# Patient Record
Sex: Female | Born: 1975 | Race: White | Hispanic: No | Smoking: Never smoker
Health system: Southern US, Community
[De-identification: ages and names within clinical notes are randomized; demographics above are authoritative.]

## PROBLEM LIST (undated history)

## (undated) DIAGNOSIS — J45909 Unspecified asthma, uncomplicated: Secondary | ICD-10-CM

## (undated) DIAGNOSIS — N979 Female infertility, unspecified: Secondary | ICD-10-CM

## (undated) DIAGNOSIS — F419 Anxiety disorder, unspecified: Secondary | ICD-10-CM

## (undated) DIAGNOSIS — R112 Nausea with vomiting, unspecified: Secondary | ICD-10-CM

## (undated) DIAGNOSIS — Z8742 Personal history of other diseases of the female genital tract: Secondary | ICD-10-CM

## (undated) DIAGNOSIS — K8020 Calculus of gallbladder without cholecystitis without obstruction: Secondary | ICD-10-CM

## (undated) DIAGNOSIS — Z9889 Other specified postprocedural states: Secondary | ICD-10-CM

## (undated) DIAGNOSIS — M4800 Spinal stenosis, site unspecified: Secondary | ICD-10-CM

## (undated) DIAGNOSIS — R52 Pain, unspecified: Secondary | ICD-10-CM

## (undated) DIAGNOSIS — Z6791 Unspecified blood type, Rh negative: Secondary | ICD-10-CM

## (undated) DIAGNOSIS — K8050 Calculus of bile duct without cholangitis or cholecystitis without obstruction: Secondary | ICD-10-CM

## (undated) DIAGNOSIS — R87619 Unspecified abnormal cytological findings in specimens from cervix uteri: Secondary | ICD-10-CM

## (undated) DIAGNOSIS — IMO0002 Reserved for concepts with insufficient information to code with codable children: Secondary | ICD-10-CM

## (undated) HISTORY — DX: Unspecified asthma, uncomplicated: J45.909

## (undated) HISTORY — DX: Personal history of other diseases of the female genital tract: Z87.42

## (undated) HISTORY — PX: OTHER SURGICAL HISTORY: SHX169

## (undated) HISTORY — DX: Female infertility, unspecified: N97.9

## (undated) HISTORY — PX: WISDOM TOOTH EXTRACTION: SHX21

## (undated) HISTORY — PX: BUNIONECTOMY: SHX129

## (undated) HISTORY — DX: Unspecified blood type, rh negative: Z67.91

## (undated) HISTORY — DX: Unspecified abnormal cytological findings in specimens from cervix uteri: R87.619

## (undated) HISTORY — DX: Spinal stenosis, site unspecified: M48.00

## (undated) HISTORY — DX: Reserved for concepts with insufficient information to code with codable children: IMO0002

---

## 1998-01-29 ENCOUNTER — Emergency Department (HOSPITAL_COMMUNITY): Admission: EM | Admit: 1998-01-29 | Discharge: 1998-01-30 | Payer: Self-pay | Admitting: Emergency Medicine

## 1999-03-22 ENCOUNTER — Encounter (INDEPENDENT_AMBULATORY_CARE_PROVIDER_SITE_OTHER): Payer: Self-pay | Admitting: Specialist

## 1999-03-22 ENCOUNTER — Other Ambulatory Visit: Admission: RE | Admit: 1999-03-22 | Discharge: 1999-03-22 | Payer: Self-pay | Admitting: Obstetrics & Gynecology

## 1999-12-05 ENCOUNTER — Other Ambulatory Visit: Admission: RE | Admit: 1999-12-05 | Discharge: 1999-12-05 | Payer: Self-pay | Admitting: Obstetrics & Gynecology

## 2000-04-02 ENCOUNTER — Other Ambulatory Visit: Admission: RE | Admit: 2000-04-02 | Discharge: 2000-04-02 | Payer: Self-pay | Admitting: Obstetrics & Gynecology

## 2001-01-08 ENCOUNTER — Other Ambulatory Visit: Admission: RE | Admit: 2001-01-08 | Discharge: 2001-01-08 | Payer: Self-pay | Admitting: Obstetrics and Gynecology

## 2002-01-28 ENCOUNTER — Other Ambulatory Visit: Admission: RE | Admit: 2002-01-28 | Discharge: 2002-01-28 | Payer: Self-pay | Admitting: Obstetrics and Gynecology

## 2002-09-11 ENCOUNTER — Emergency Department (HOSPITAL_COMMUNITY): Admission: EM | Admit: 2002-09-11 | Discharge: 2002-09-11 | Payer: Self-pay | Admitting: Emergency Medicine

## 2002-09-12 ENCOUNTER — Encounter: Payer: Self-pay | Admitting: Emergency Medicine

## 2003-03-19 ENCOUNTER — Other Ambulatory Visit: Admission: RE | Admit: 2003-03-19 | Discharge: 2003-03-19 | Payer: Self-pay | Admitting: Obstetrics and Gynecology

## 2003-09-07 ENCOUNTER — Emergency Department (HOSPITAL_COMMUNITY): Admission: EM | Admit: 2003-09-07 | Discharge: 2003-09-07 | Payer: Self-pay | Admitting: Emergency Medicine
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
## 2003-12-31 ENCOUNTER — Ambulatory Visit (HOSPITAL_COMMUNITY): Admission: RE | Admit: 2003-12-31 | Discharge: 2003-12-31 | Payer: Self-pay | Admitting: Obstetrics and Gynecology

## 2003-12-31 IMAGING — RF DG HYSTEROGRAM
5 series · 5 of 5 positions shown · IV contrast (omnipaque)
Comparison: none

CLINICAL DATA: Primary infertility.  Assess tubal patency.
 HYSTEROGRAM:
 Following sterile cleansing of the external cervical os with Betadine, hysterosalpingography was performed via placement of a 5 French hysterosalpingography catheter into the endocervical canal where it was stabilized with an end catheter balloon.  Approximately 10 cc of Omnipaque 300 was injected into the endometrial canal without difficulty.
 A normal endometrial morphology is seen.  Both fallopian tubes fill and demonstrate a normal morphology.  Bilateral free intraperitoneal spill was documented.  The post-drainage film demonstrates no evidence for loculation of the contrast within the pelvis to suggest the presence of peritubal or periovarian adhesions.  
 IMPRESSION
 Normal HSG.

[Series 1: run · 1 of 1 slices shown (1 of 5)]
[im 1/1]
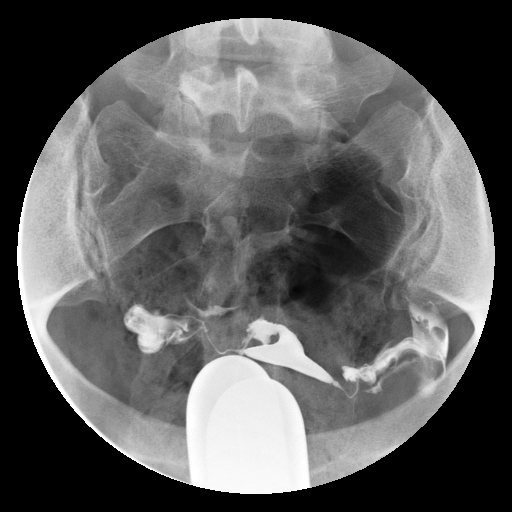

[Series 2: run · 1 of 1 slices shown (2 of 5)]
[im 1/1]
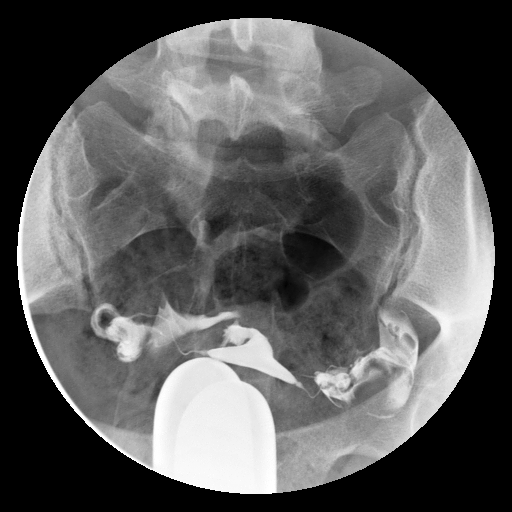

[Series 3: run · 1 of 1 slices shown (3 of 5)]
[im 1/1]
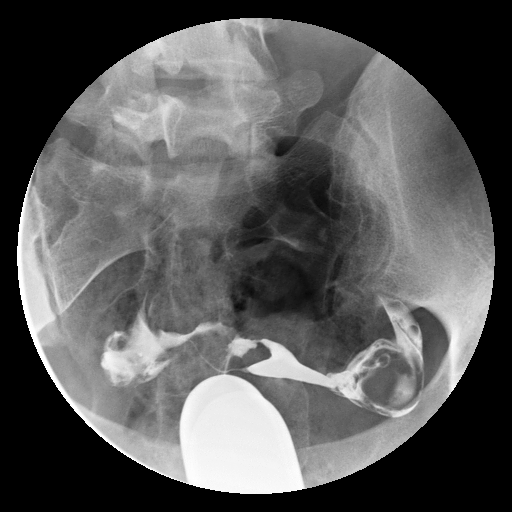

[Series 4: run · 1 of 1 slices shown (4 of 5)]
[im 1/1]
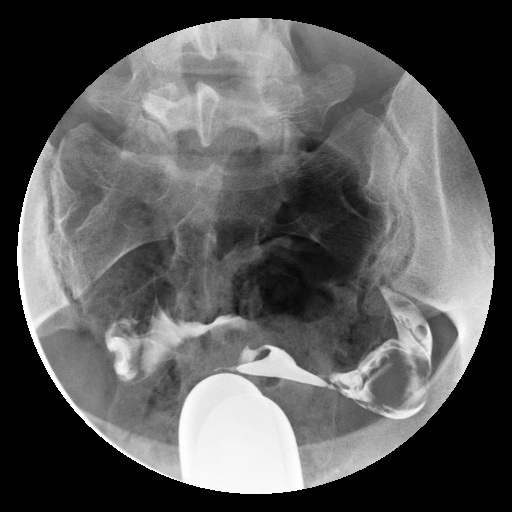

[Series 5: run · 1 of 1 slices shown (5 of 5)]
[im 1/1]
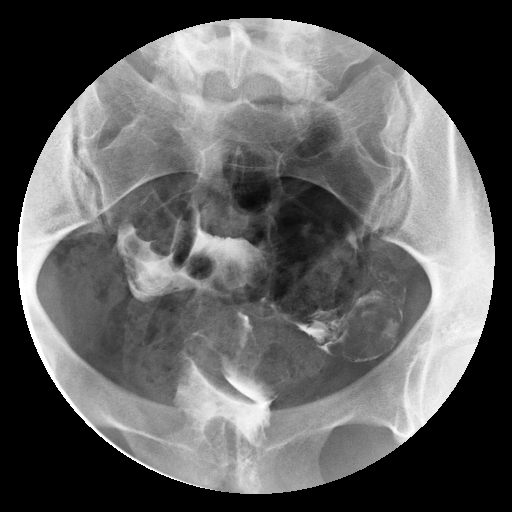

[5 of 5 positions shown; findings below may reference images not displayed]

## 2004-04-01 ENCOUNTER — Ambulatory Visit (HOSPITAL_COMMUNITY): Admission: RE | Admit: 2004-04-01 | Discharge: 2004-04-01 | Payer: Self-pay | Admitting: Obstetrics and Gynecology

## 2004-04-01 IMAGING — US US OB TRANSVAGINAL MODIFY
1 series · 14 of 28 positions shown · non-contrast
Comparison: none

CLINICAL DATA: 7 week, 0 day gestational age by LMP.  Right-sided pelvic pain.  Confirm IUP and evaluate for ectopic pregnancy. 
OBSTETRICAL ULTRASOUND WITH TRANSVAGINAL 
A single living intrauterine gestation is seen with measured heart rate of 122. Embryonic crown-rump length measures 1.1 cm, corresponding to a gestational age of 7 weeks, 1 day.  Normal appearing yolk sac is seen.  A small subchorionic hemorrhage or implantation bleed is seen in the right superior aspect of the gestational sac which measures approximately 1 x 2 cm.  No other maternal uterine abnormality is seen. 
No adnexal masses or free fluid are identified with either transabdominal or transvaginal sonography.  The ovaries are both visualized on transvaginal sonography.  The right ovary contains a thick walled corpus luteum cyst measuring approximately 2 cm.  The left ovary is normal in appearance. 
IMPRESSION
1.  Single living intrauterine gestation with an estimated gestational age of 7 weeks, 1 day and sonographic EDC of [DATE].  This correlates well with LMP.
2.  2 cm right ovarian corpus luteum noted.  Small subchorionic hemorrhage or implantation bleed also noted.

[Series 1: us ob transvaginal modify · 0.15mm/px · 14 of 41 slices shown]
[im 2/41]
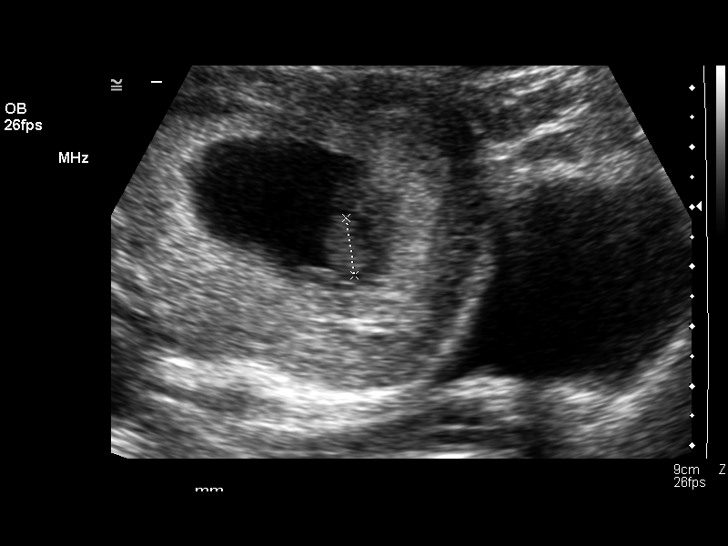
[im 5/41]
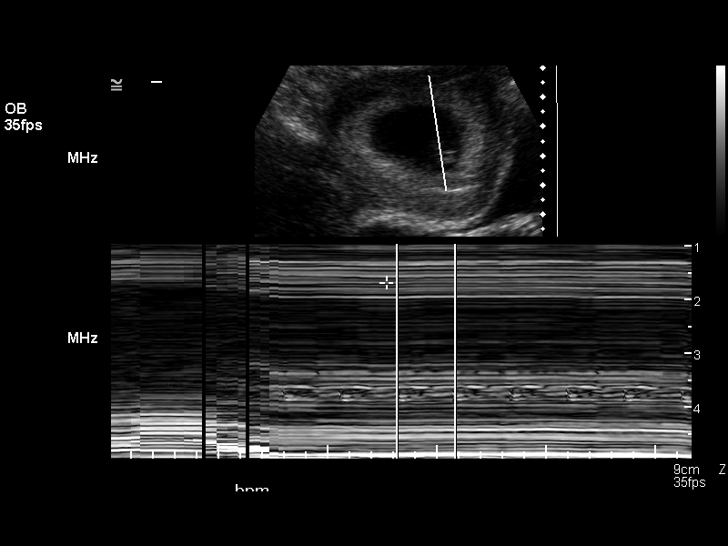
[im 8/41]
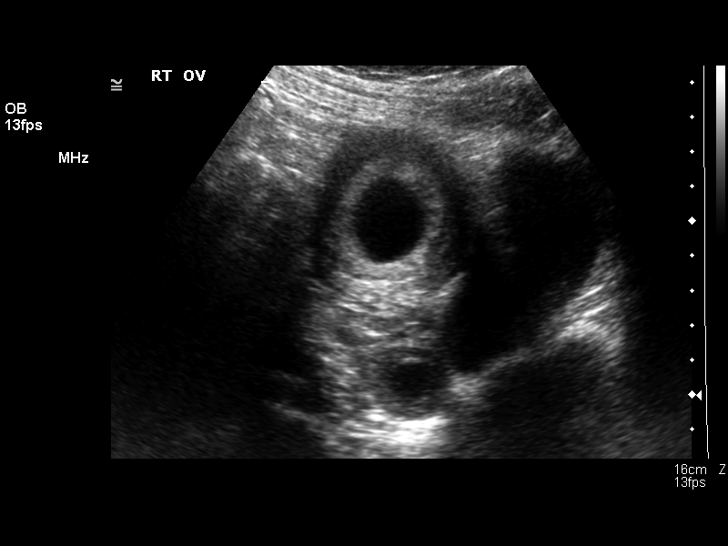
[im 11/41]
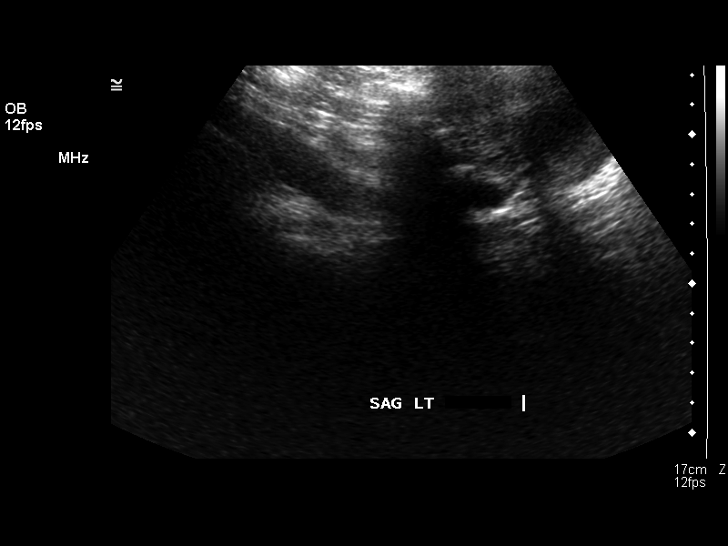
[im 14/41]
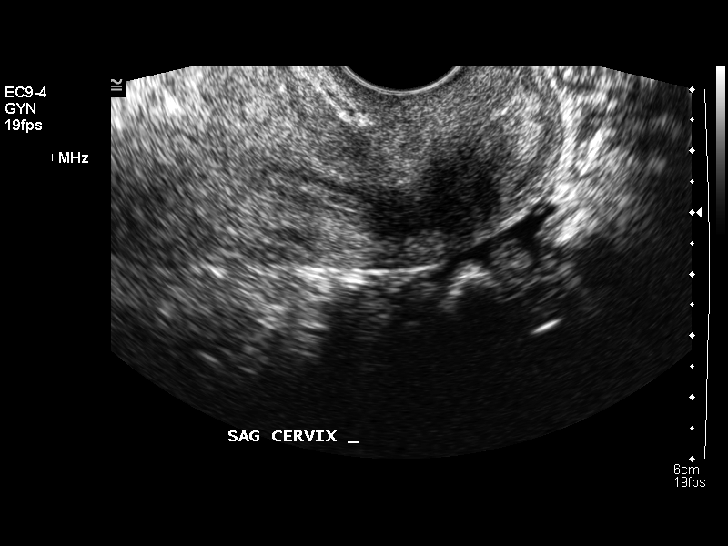
[im 17/41]
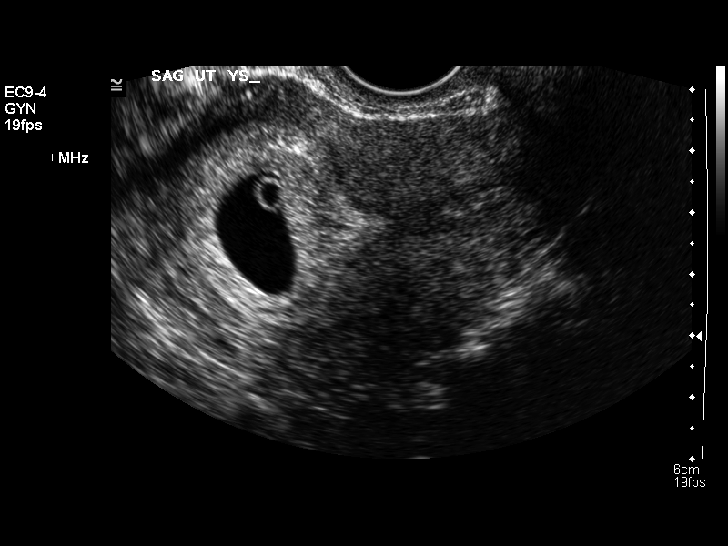
[im 20/41]
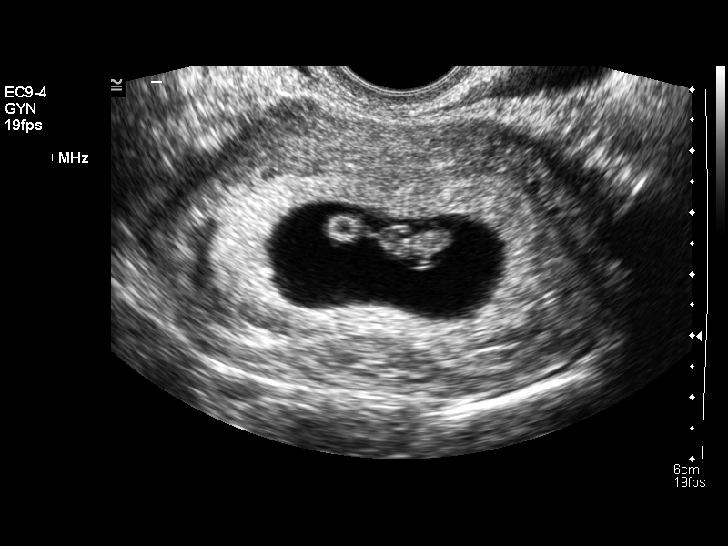
[im 23/41]
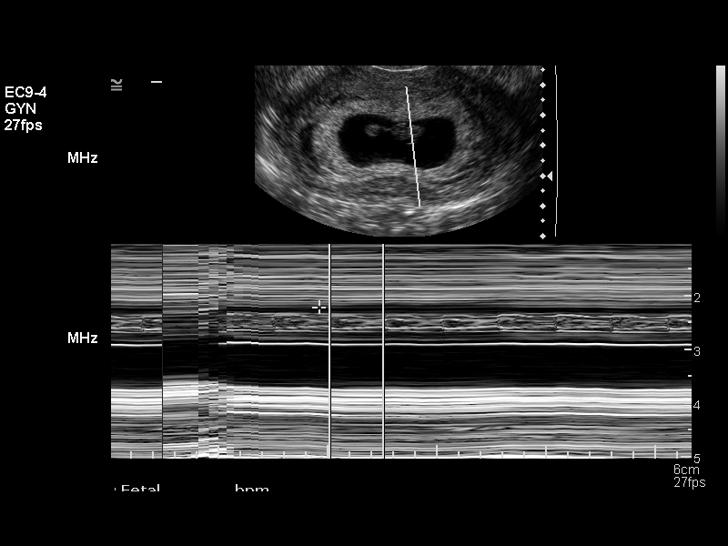
[im 26/41]
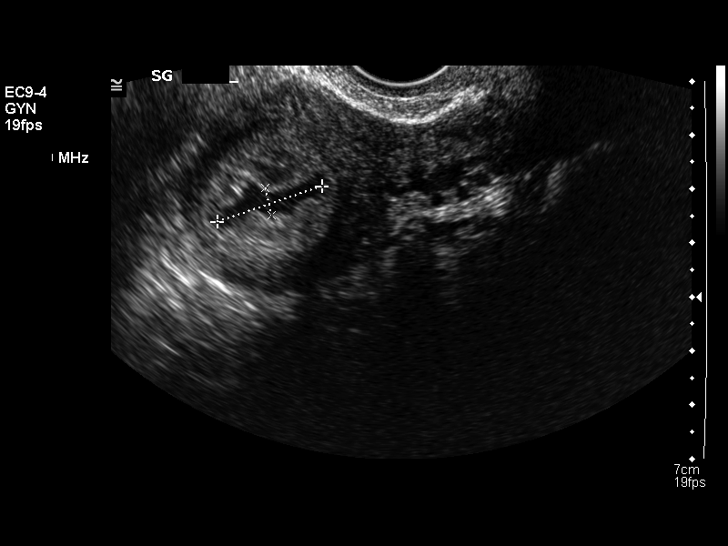
[im 29/41]
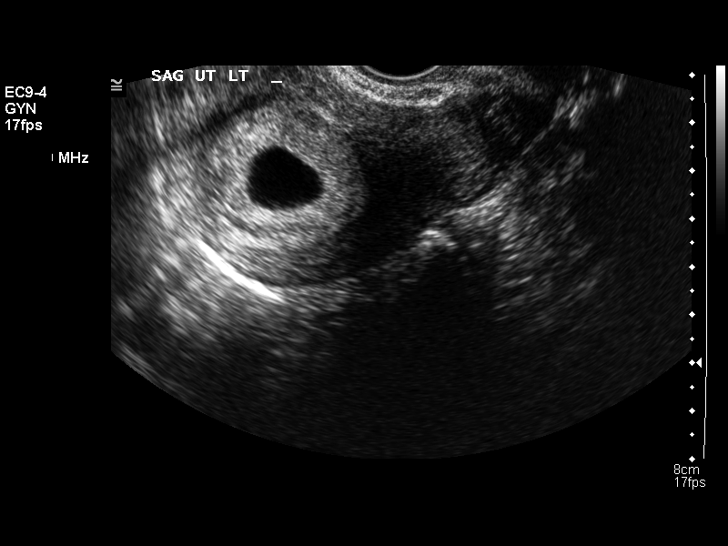
[im 32/41]
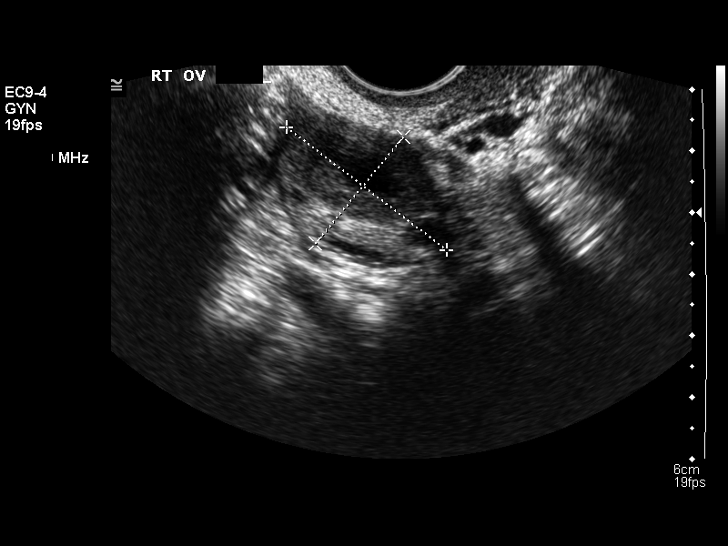
[im 35/41]
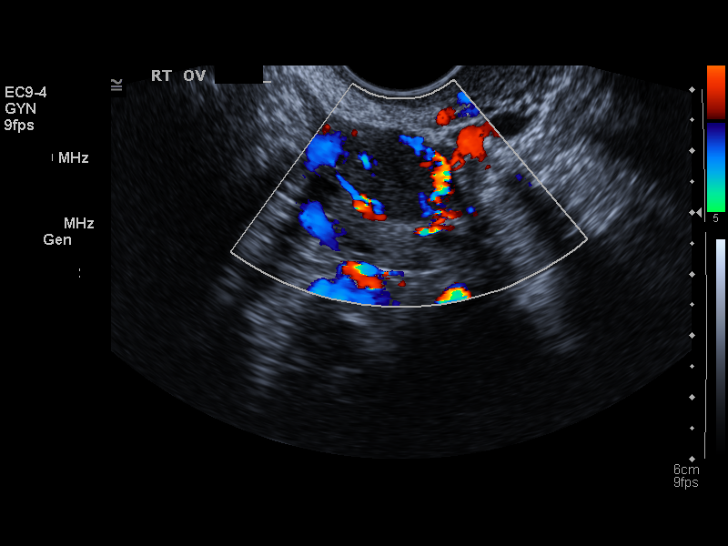
[im 38/41]
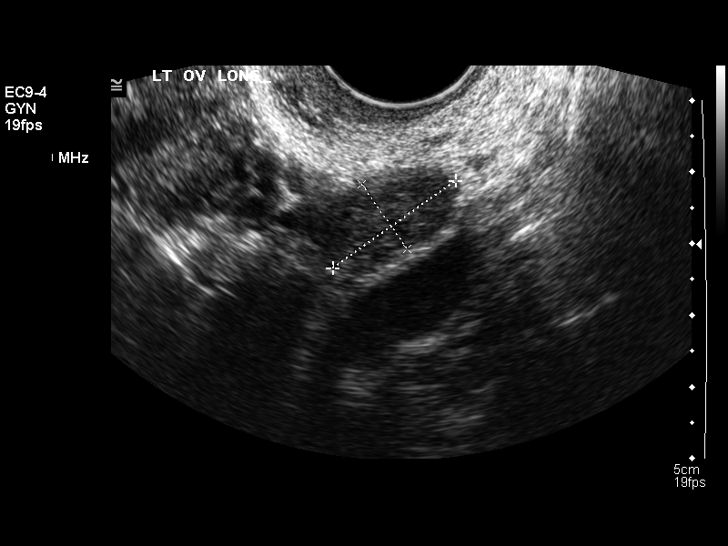
[im 41/41]
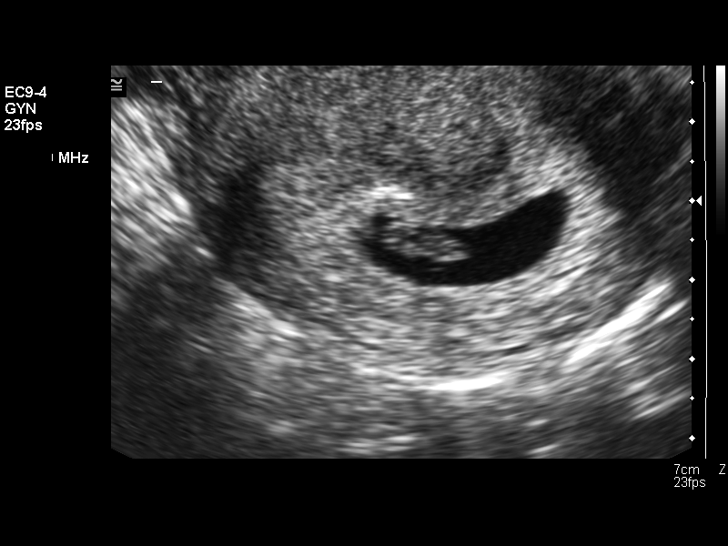

[14 of 28 positions shown; findings below may reference images not displayed]

## 2004-04-12 ENCOUNTER — Inpatient Hospital Stay (HOSPITAL_COMMUNITY): Admission: AD | Admit: 2004-04-12 | Discharge: 2004-04-12 | Payer: Self-pay | Admitting: Obstetrics and Gynecology

## 2004-04-20 ENCOUNTER — Other Ambulatory Visit: Admission: RE | Admit: 2004-04-20 | Discharge: 2004-04-20 | Payer: Self-pay | Admitting: Obstetrics and Gynecology

## 2004-08-29 ENCOUNTER — Inpatient Hospital Stay (HOSPITAL_COMMUNITY): Admission: AD | Admit: 2004-08-29 | Discharge: 2004-08-29 | Payer: Self-pay | Admitting: Obstetrics and Gynecology

## 2004-09-23 ENCOUNTER — Observation Stay (HOSPITAL_COMMUNITY): Admission: AD | Admit: 2004-09-23 | Discharge: 2004-09-24 | Payer: Self-pay | Admitting: Obstetrics and Gynecology

## 2004-09-23 IMAGING — US US FETAL BPP W/O NONSTRESS
1 series · 14 of 15 positions shown · non-contrast
Comparison: none

CLINICAL DATA: 32 weeks pregnant with decreased fetal movement. 
 ULTRASOUND OF FETAL BPP WITHOUT NONSTRESS:
 A single fetus is present in cephalic position.  Fetal heart rate is measured at 131 beats per minute. Fetal movement and breathing movements are detected.  The placenta is anteriorly located grade I.  No previa is seen. The quantity of amniotic fluid is subjectively normal. AFI is 14.8 cm. 8.6 cm is within the 5th percentile for a 32-week gestation with 24.2 cm in the 95th percentile. 
 Evaluation of fetal biometry was not requested. 
 Evaluation of fetal anatomy shows stomach on left, 3 vessel cord, both kidneys, and bladder to appear normal.   Evaluation of the anatomy is limited by gestational age and request for only BPP. 
 The maternal cervix measures 3.1 cm transabdominally. 
 Biophysical profile scores [DATE] over 15 minutes.

[Series 1: us fetal bpp w/o nonstress · 0.45mm/px · 14 of 15 slices shown]
[im 1/15]
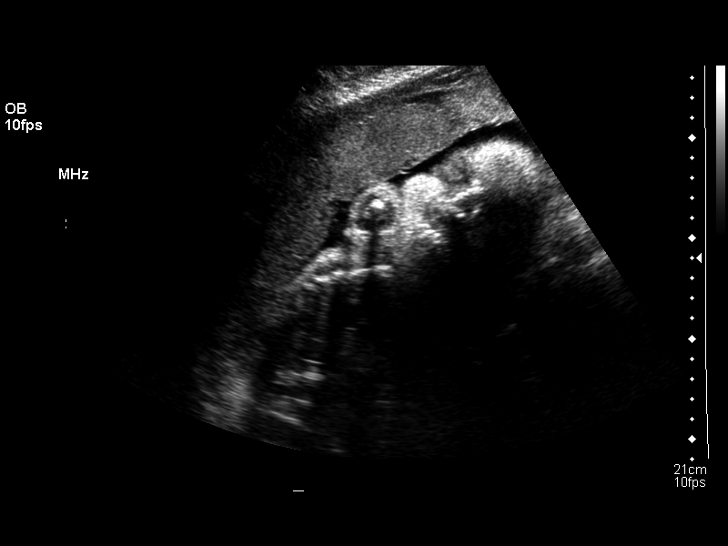
[im 2/15]
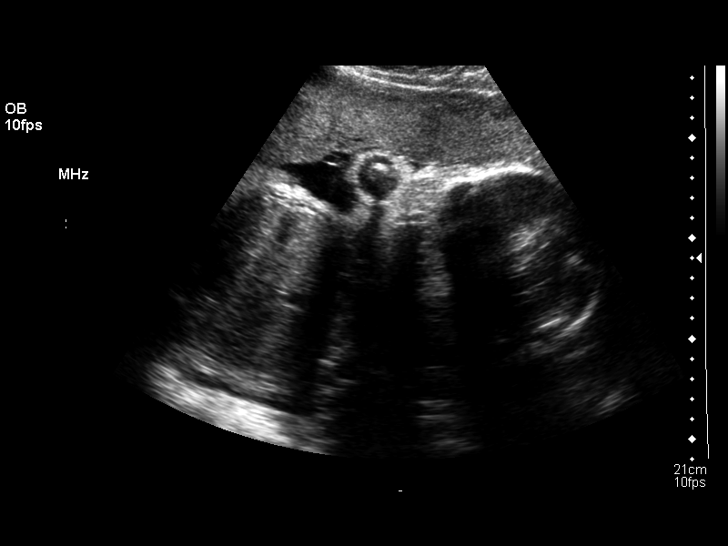
[im 3/15]
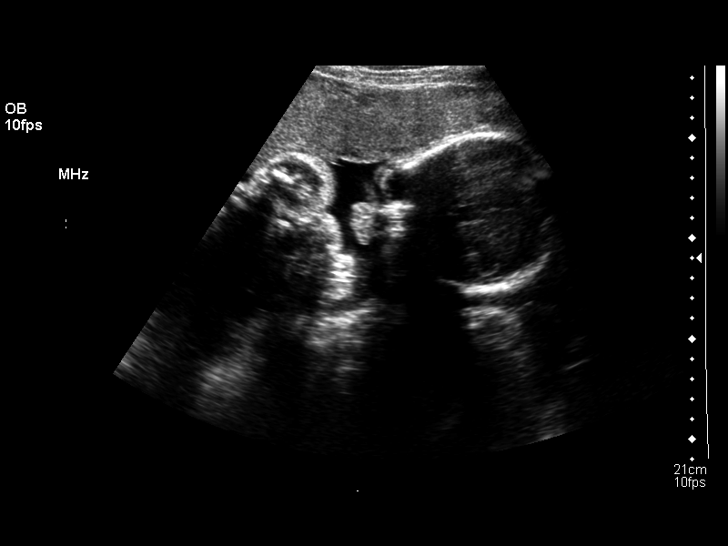
[im 4/15]
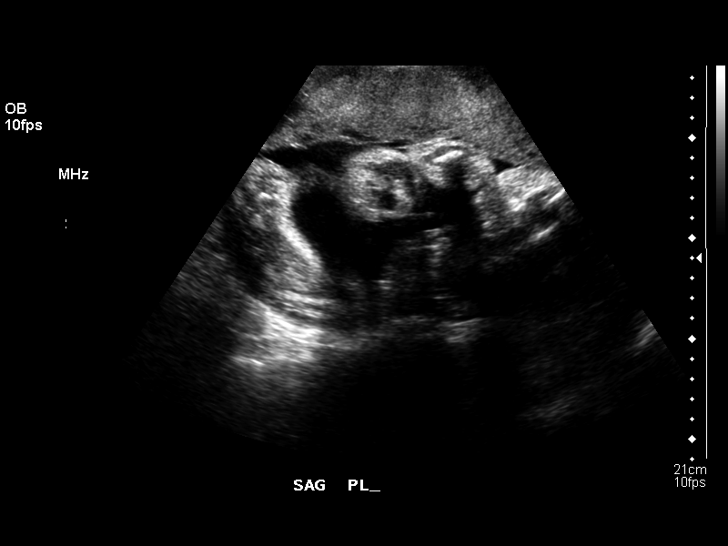
[im 5/15]
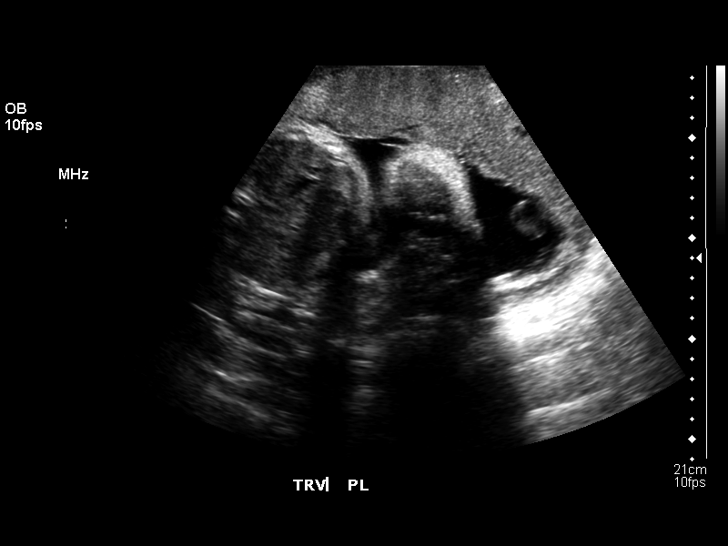
[im 6/15]
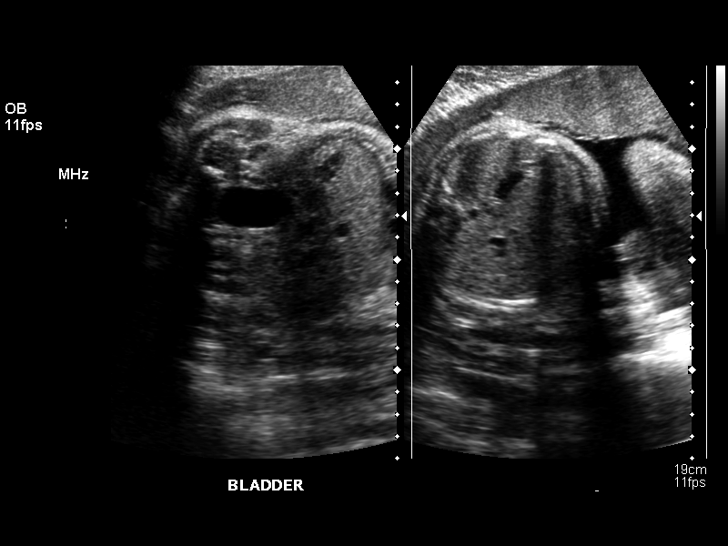
[im 7/15]
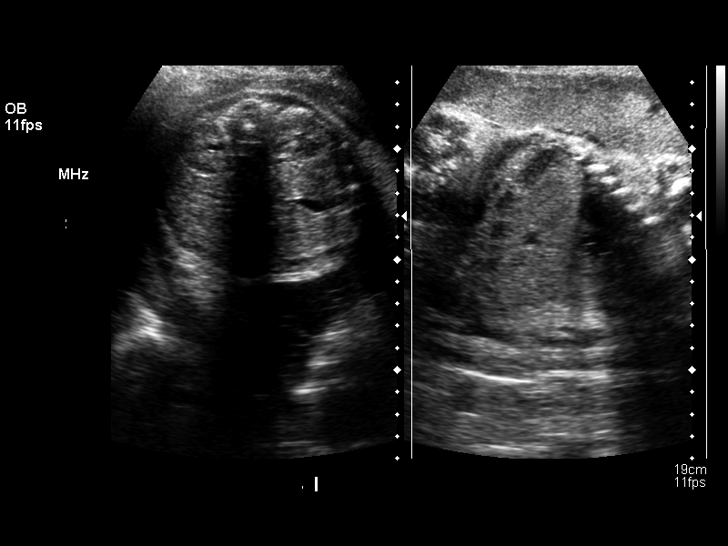
[im 9/15]
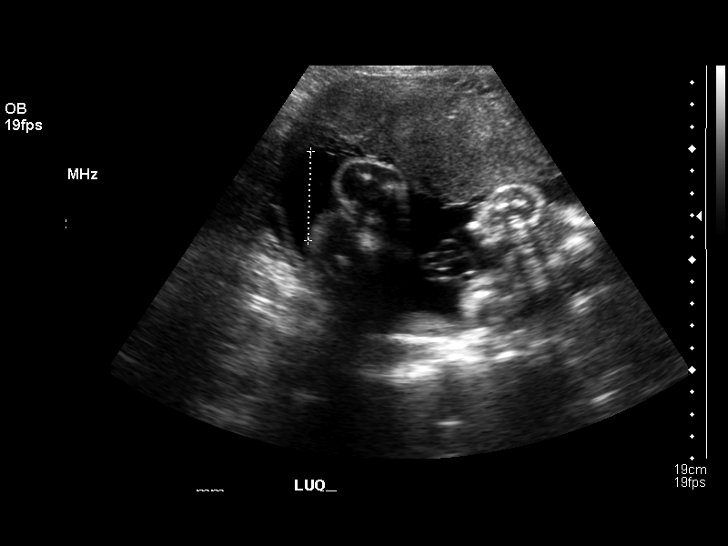
[im 10/15]
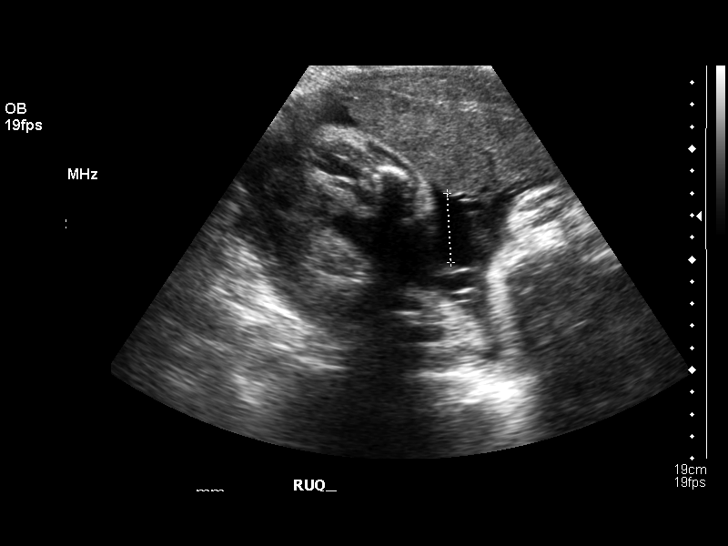
[im 11/15]
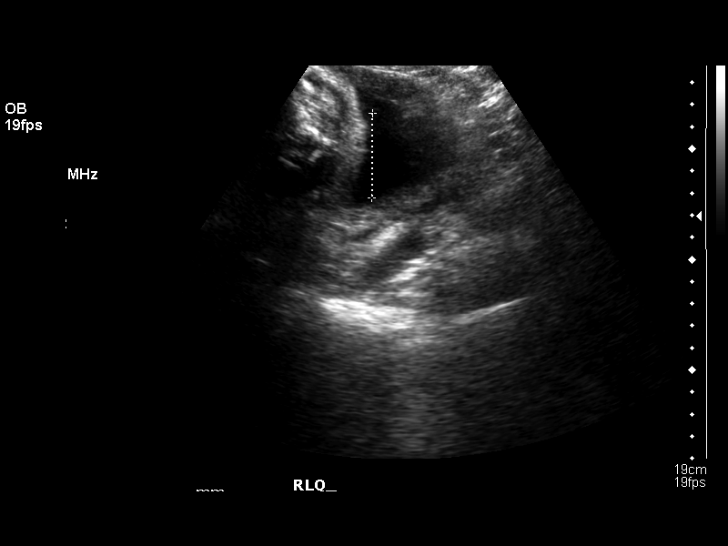
[im 12/15]
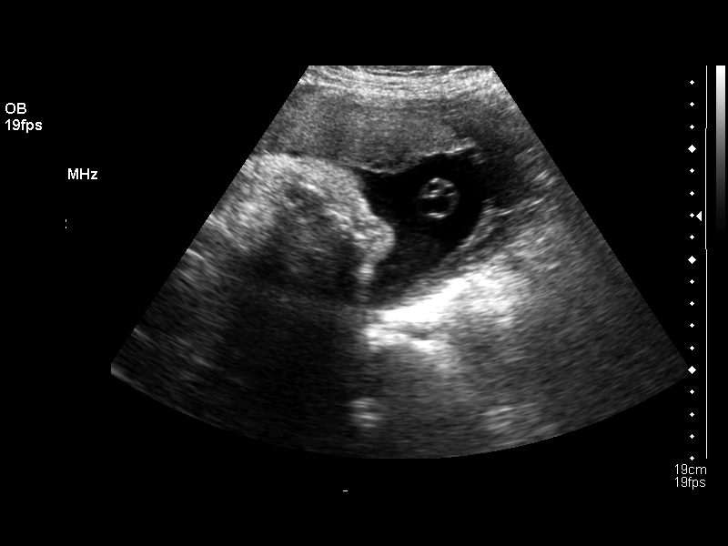
[im 13/15]
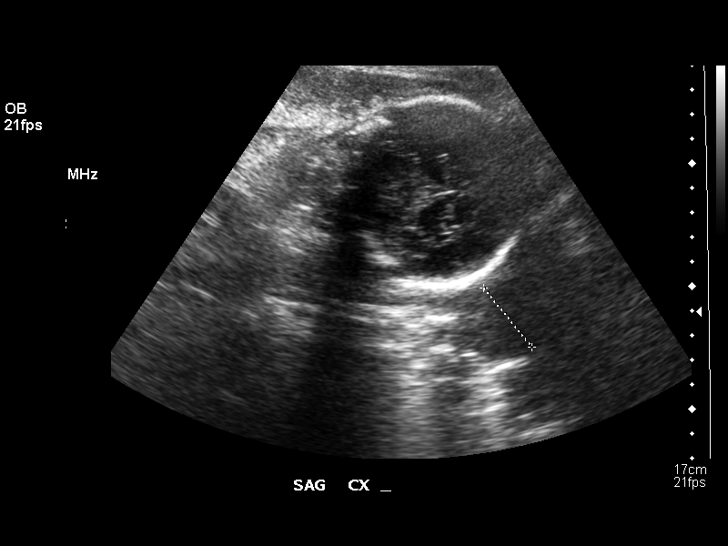
[im 14/15]
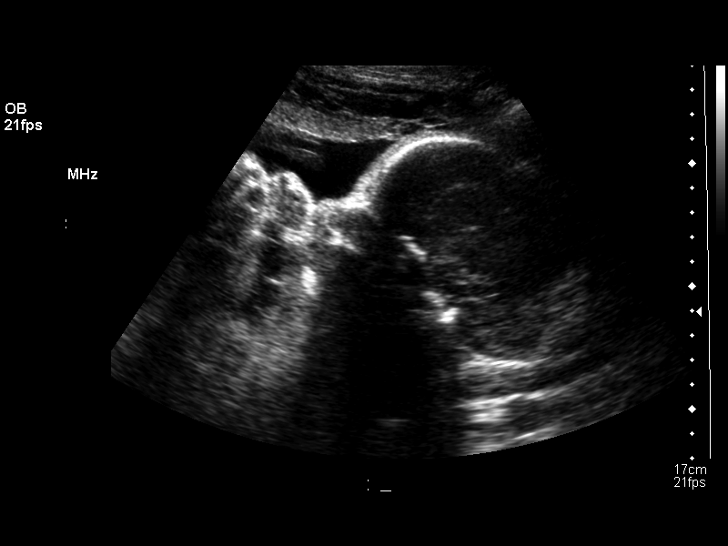
[im 15/15]
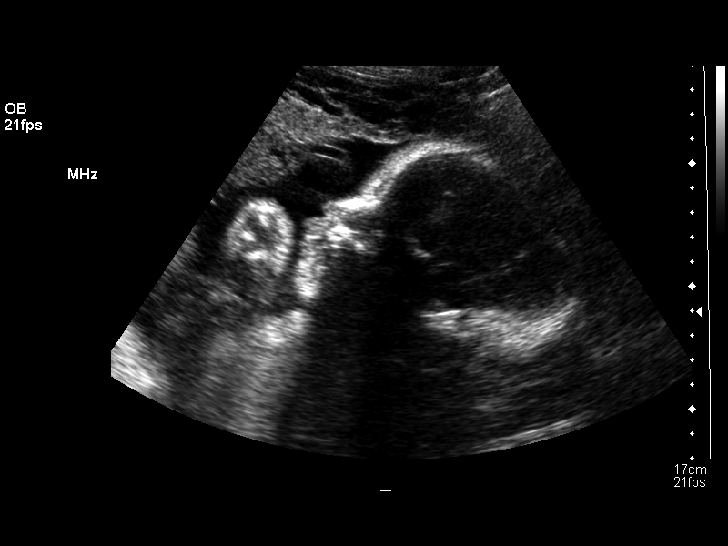

[14 of 15 positions shown; findings below may reference images not displayed]

IMPRESSION: 1.  Biophysical profile with a score [DATE] over 15 minutes. 
 2.  Single fetus in cephalic position.

## 2004-09-24 IMAGING — US US OB COMP +14 WK
1 series · 13 of 28 positions shown · non-contrast
Comparison: none

CLINICAL DATA: Growth.  Evaluate fluid and do biophysical profile.  Decreased fetal movement.
 OBSTETRICAL ULTRASOUND +14 WEEKS AND BIOPHYSICAL PROFILE:
 A single fetus is present in cephalic position.  Fetal heart rate is measured at 147 beats per minute.  Fetal movement and breathing movements are detected.  The placenta is anteriorly located and grade I.  No previa is seen.  The quantity of amniotic fluid is subjectively normal.  AFI is 13.7 cm.  For a 32 week gestation, 8.6 cm is in the fifth percentile and 22.4 cm the 95th percentile.
 Evaluation of fetal biometry yields the following measurements:  
 BPD:  8.4 cm equates to gestational age of 34 weeks 0 days
 HEAD CIRCUMFERENCE:  30.8 cm ? 34 weeks 3 days
 ABDOMINAL CIRCUMFERENCE:  29.5 cm ? 33 weeks 3 days
 FEMORAL LENGTH:  6.3 cm ? 32 weeks 4 days
 Mean current gestational age is 33 weeks 4 days
 The following ratios were obtained:
 BPD/OFD:
 FL/BPD:
 FL/AC:
 HC/AC:
 All ratios are within normal limits.
 Best estimated fetal weight using the KARINO is [VP] grams, which is in the 90 to 95th percentile for a 32 week gestation.
 Evaluation of fetal anatomy shows the lateral ventricles, thalami, stomach on left, three-vessel cord, both kidneys, and bladder to appear normal.  Maternal cervix measures 3.5 cm transabdominally.
 Evaluate of biophysical profile yields a score of [DATE] over 15 minutes.
 Fetal Doppler evaluation yields the following measurements:
 UA S/D:
 MCA PI:
 Both measurements are within normal limits.

[Series 1: us ob comp +14 wk · 0.29mm/px · 13 of 33 slices shown]
[im 2/33]
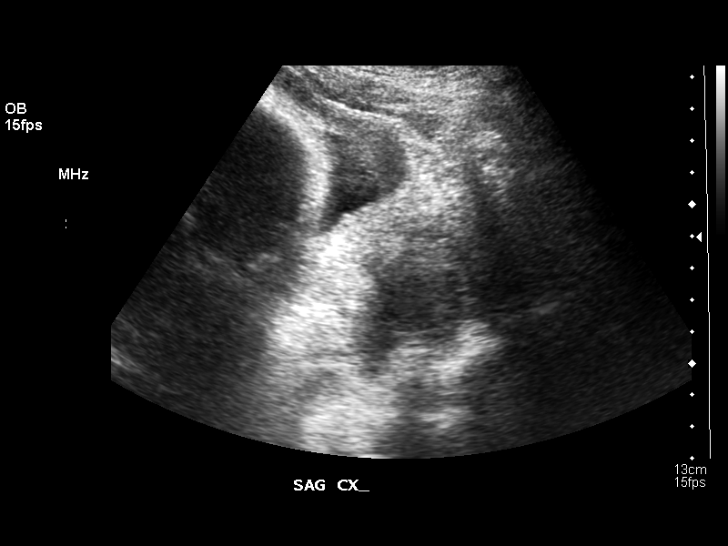
[im 4/33]
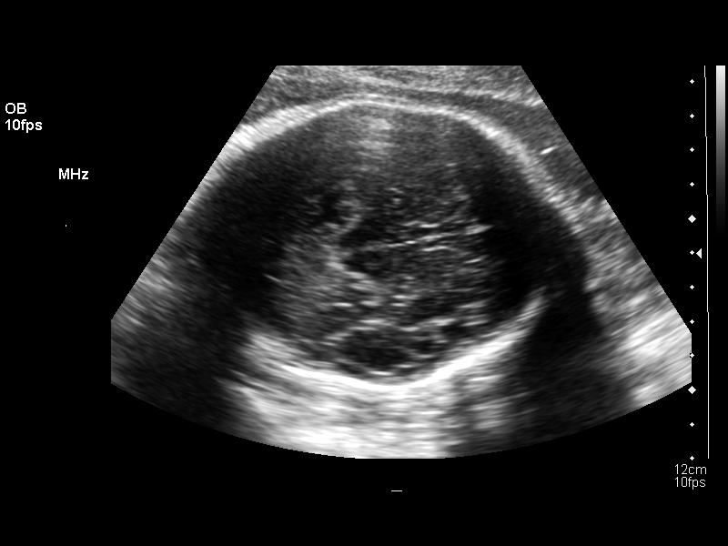
[im 6/33]
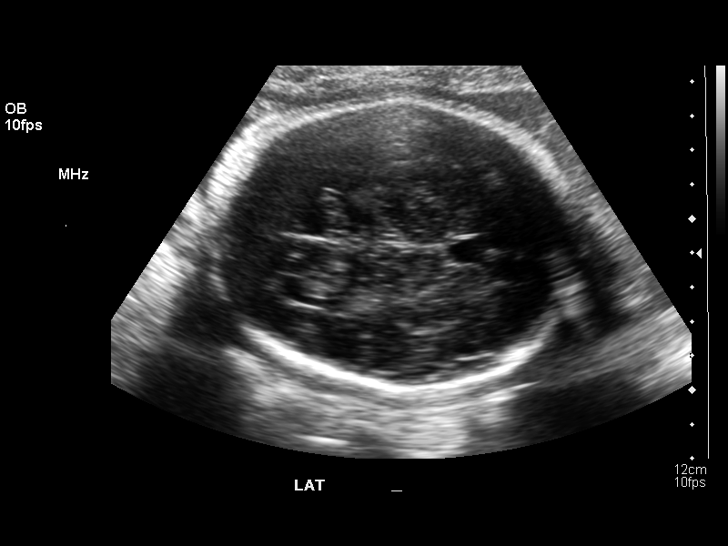
[im 9/33]
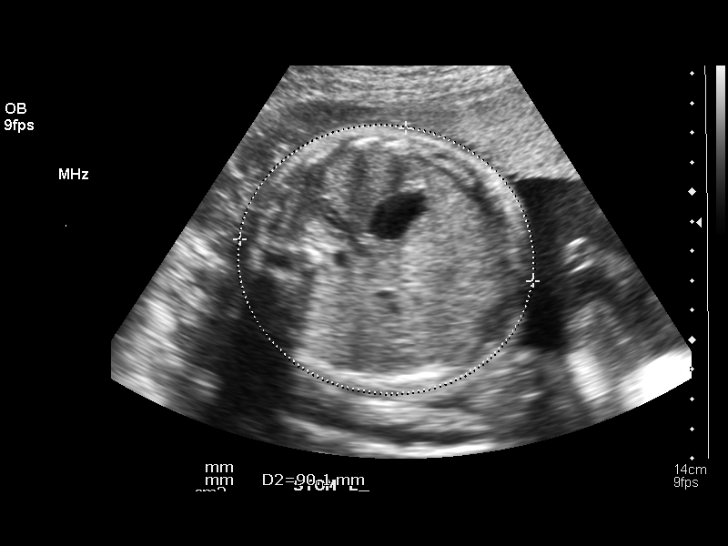
[im 11/33]
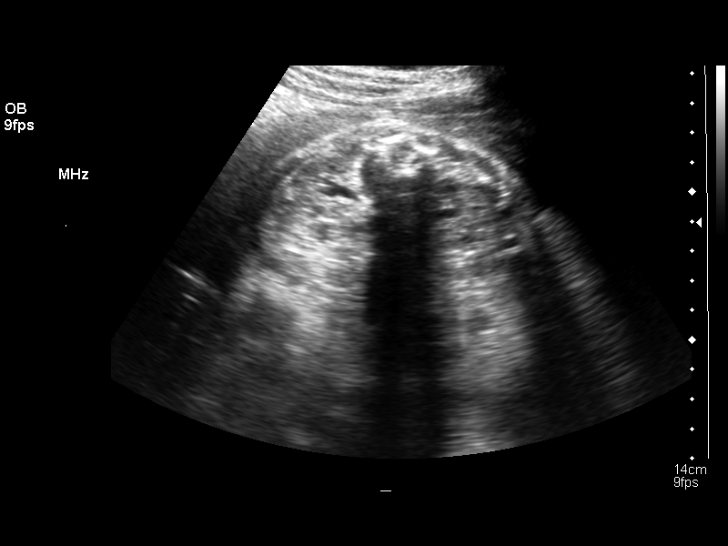
[im 14/33]
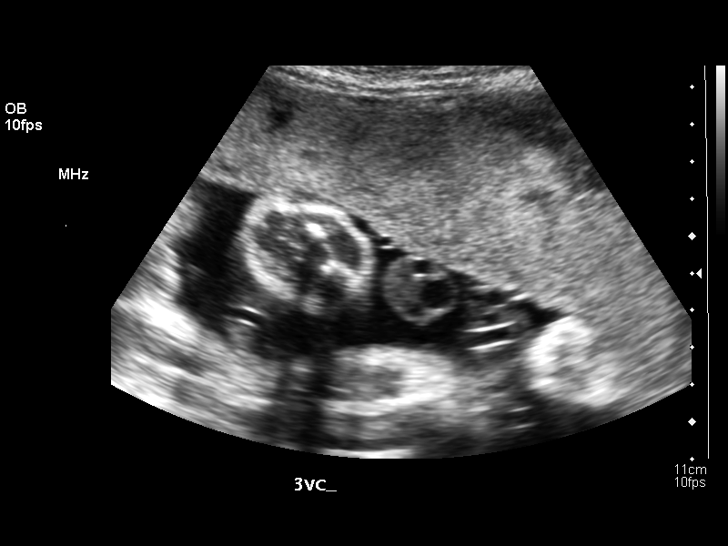
[im 17/33]
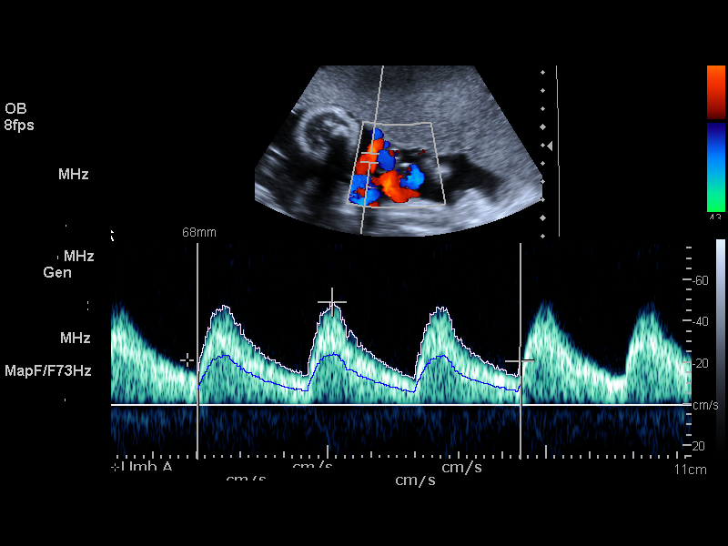
[im 19/33]
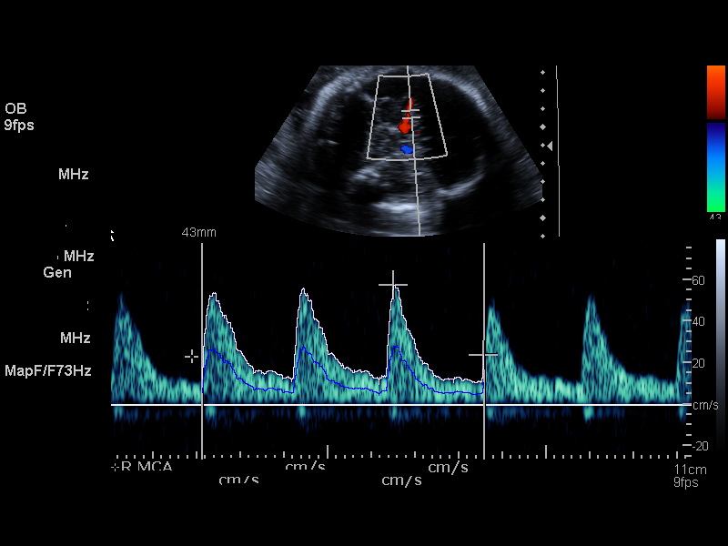
[im 22/33]
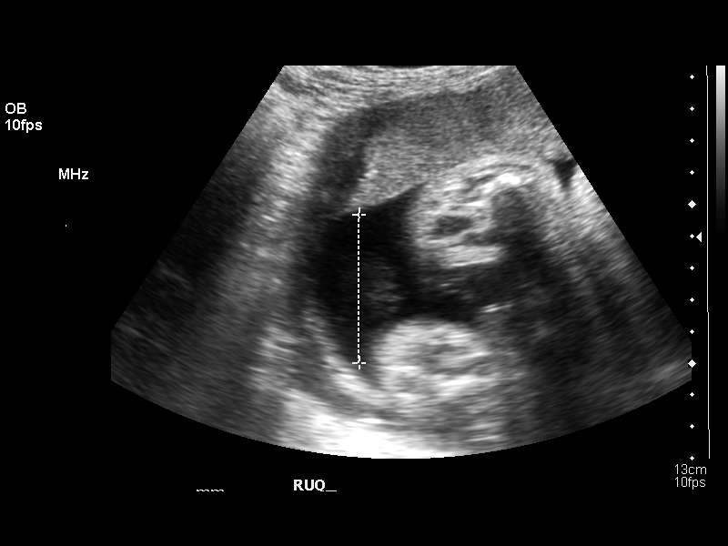
[im 24/33]
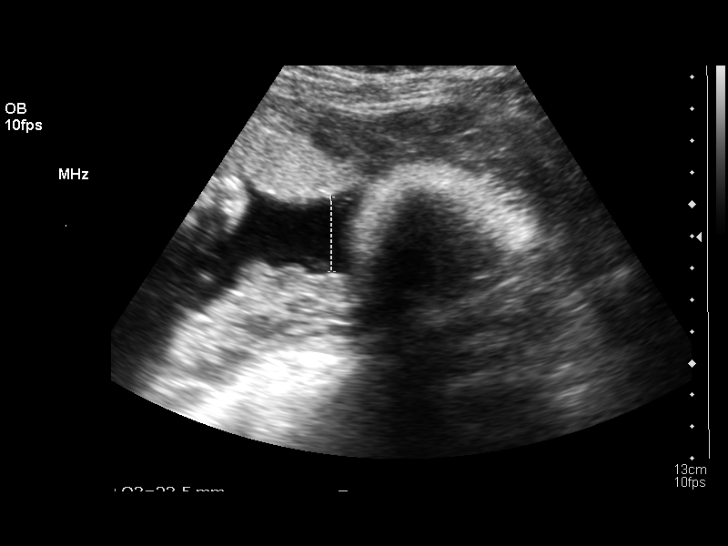
[im 27/33]
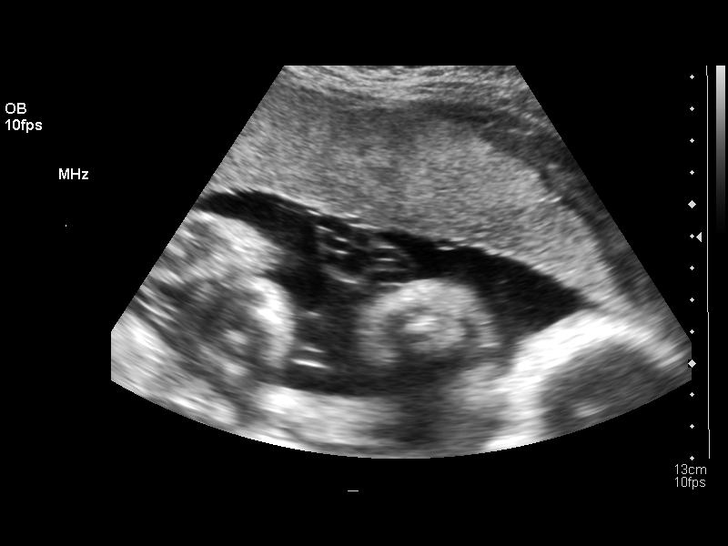
[im 29/33]
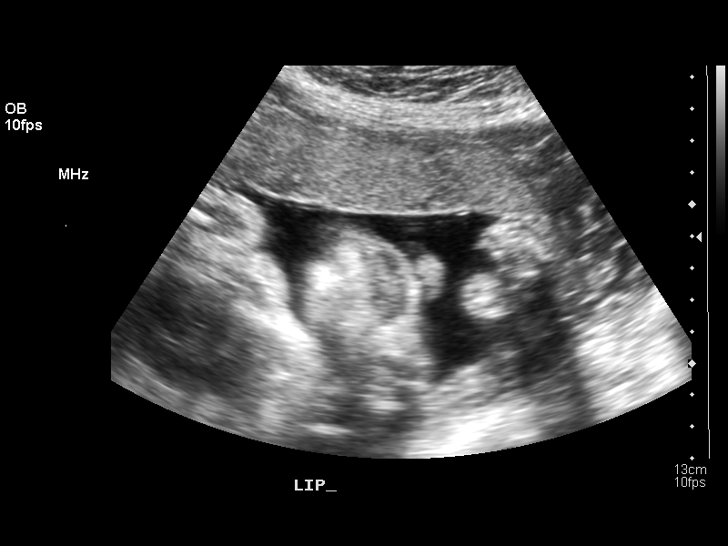
[im 31/33]
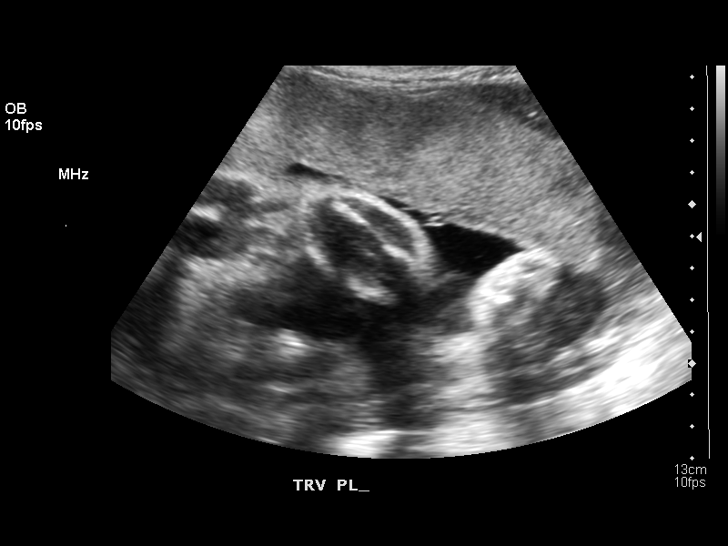

[13 of 28 positions shown; findings below may reference images not displayed]

IMPRESSION: 1.  Single fetus in cephalic position of approximately 33 weeks 4 days gestation.
 2.  Biophysical profile score of [DATE] over 15 minutes.
 3.  Normal fetal Doppler UA and MCA.

## 2004-09-26 ENCOUNTER — Inpatient Hospital Stay (HOSPITAL_COMMUNITY): Admission: AD | Admit: 2004-09-26 | Discharge: 2004-09-26 | Payer: Self-pay | Admitting: Obstetrics and Gynecology

## 2004-09-26 IMAGING — US US FETAL BPP W/O NONSTRESS
1 series · 14 of 22 positions shown · non-contrast
Comparison: none

CLINICAL DATA: Variables on the monitor.  Assess fetal well-being.

[Series 1: us fetal bpp w/o nonstress · 0.35mm/px · 22 acquisitions, 14 frames shown]
[im 1/22]
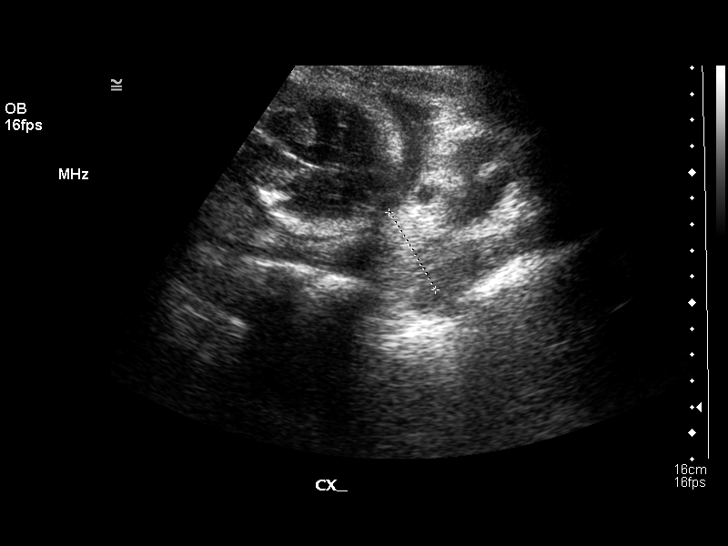
[im 3/22]
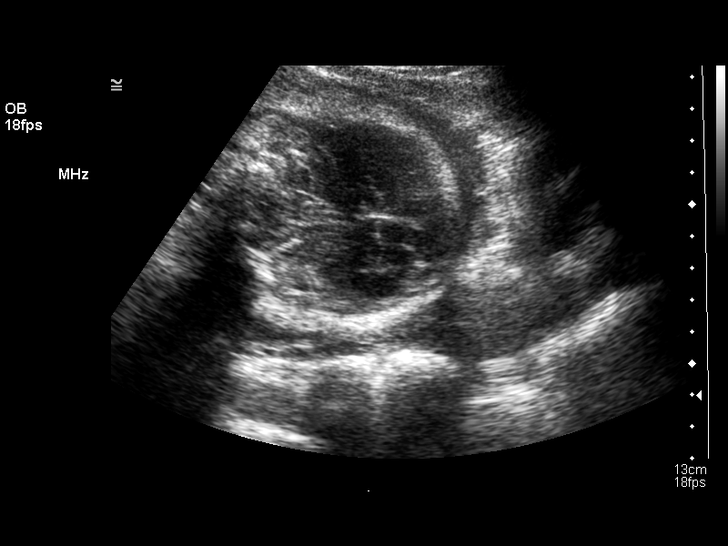
[im 4/22]
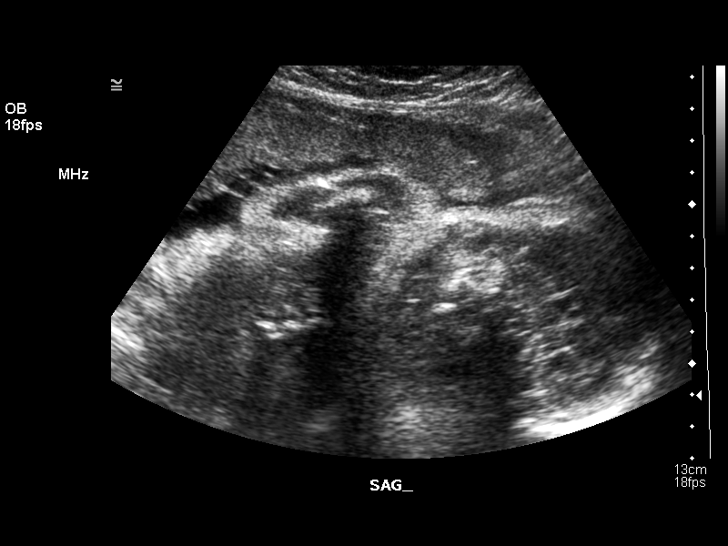
[im 6/22]
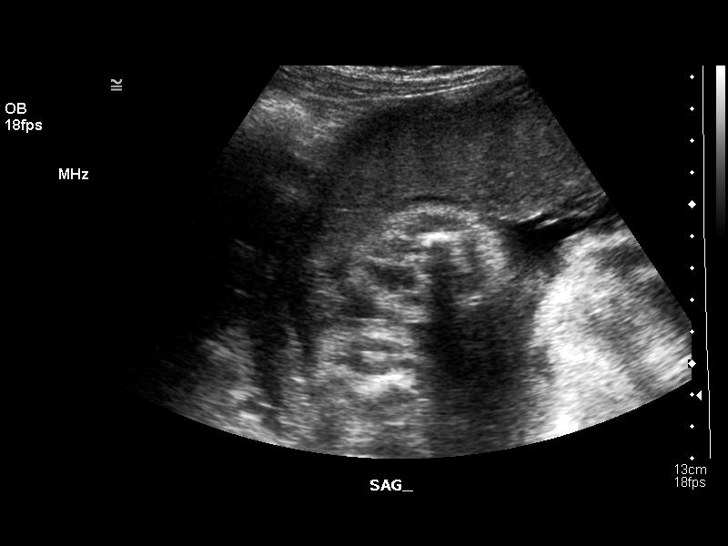
[im 8/22]
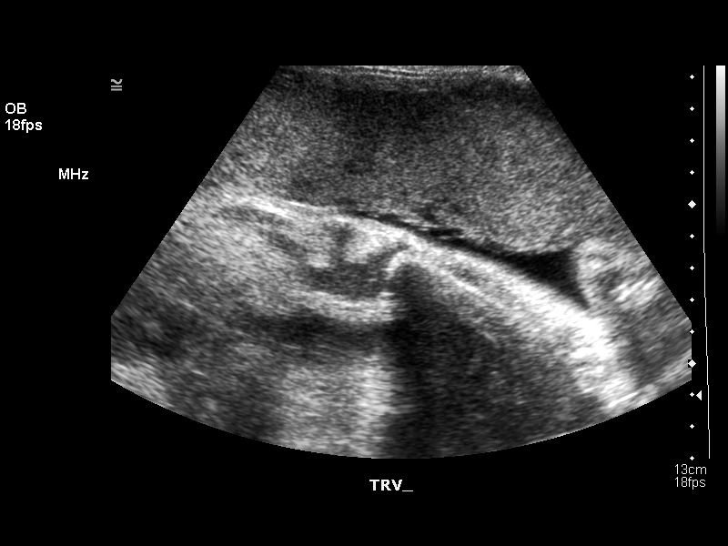
[im 9/22]
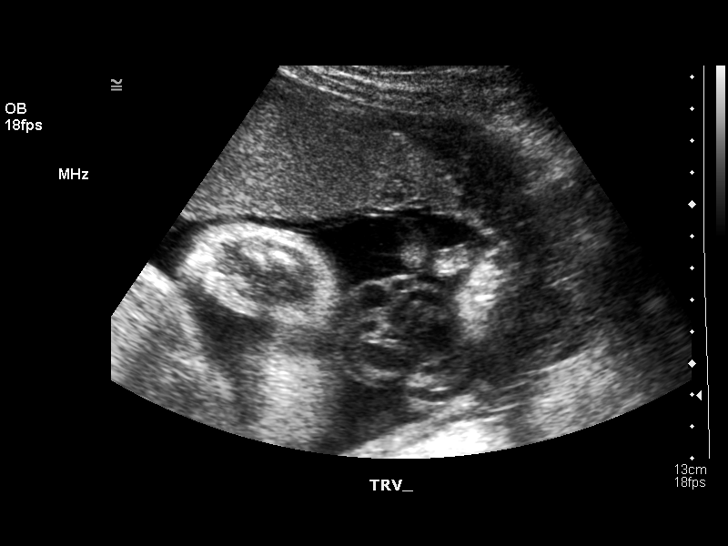
[im 11/22]
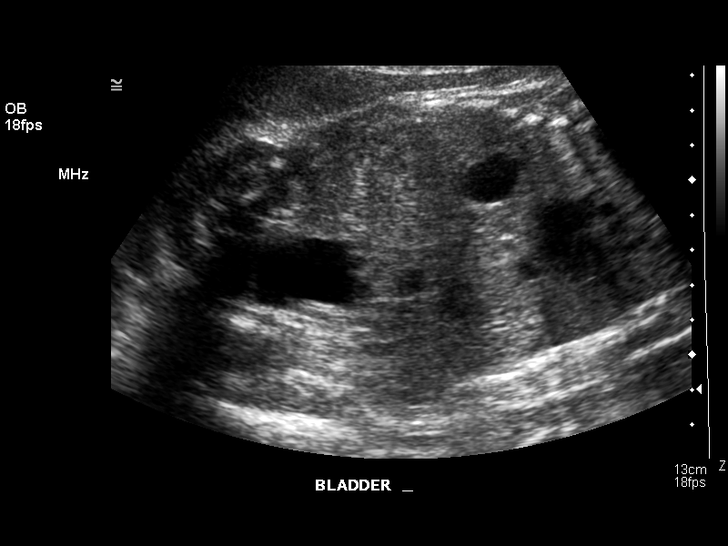
[im 12/22]
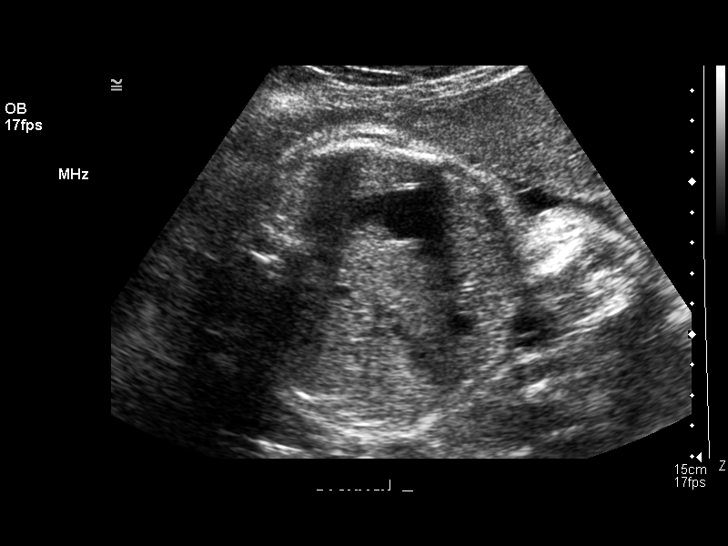
[im 14/22]
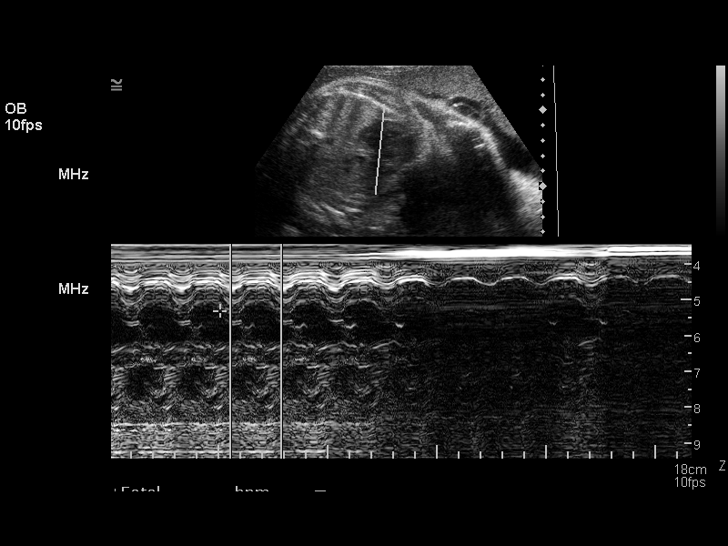
[im 15/22]
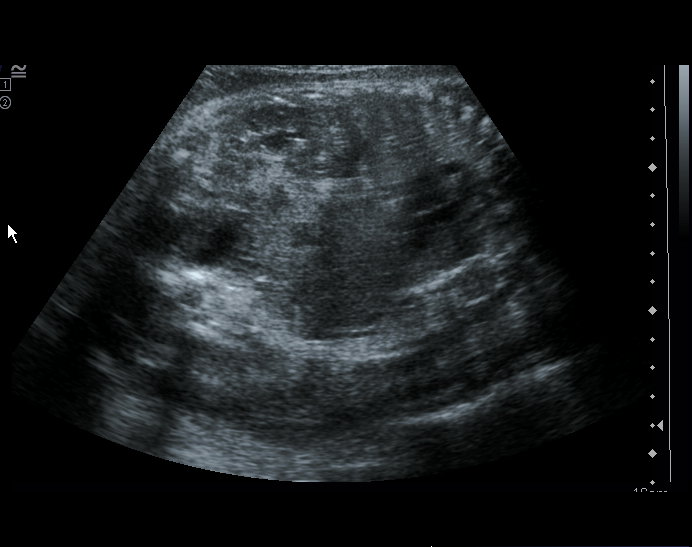
[im 17/22]
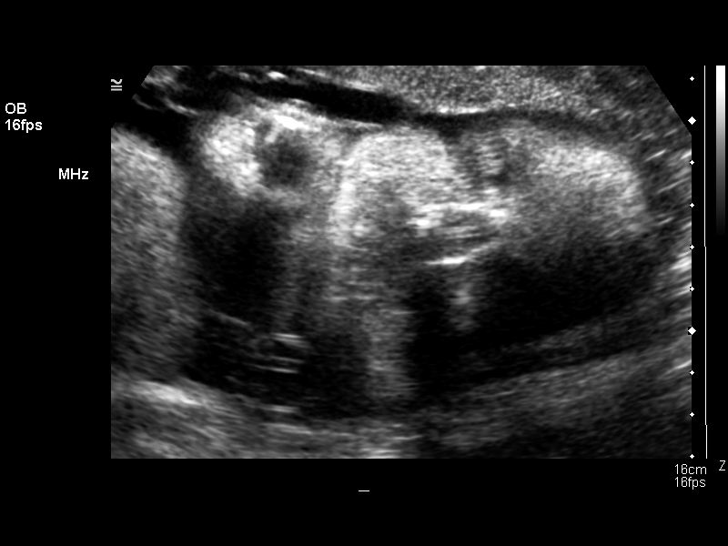
[im 19/22]
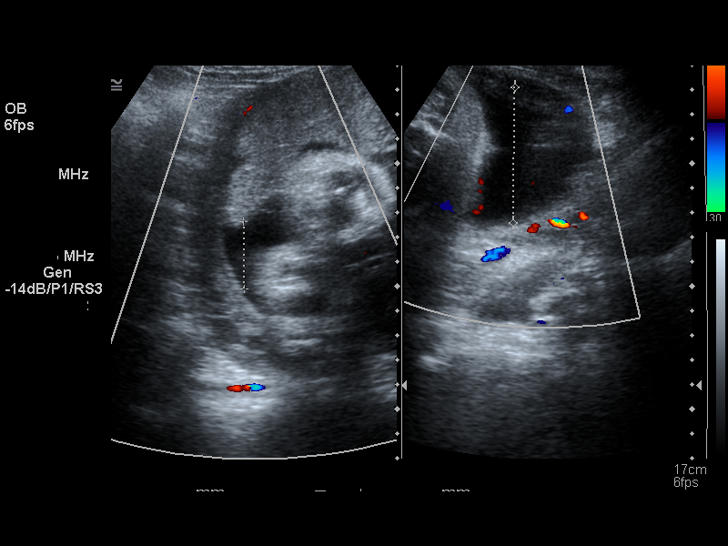
[im 20/22]
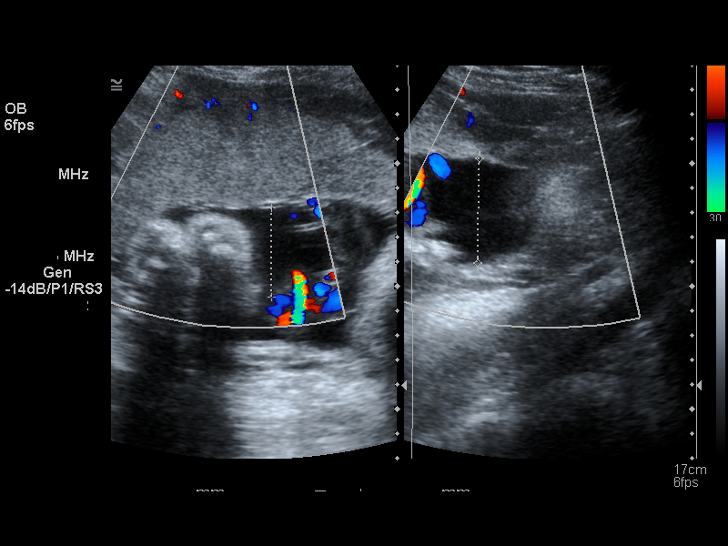
[im 22/22]
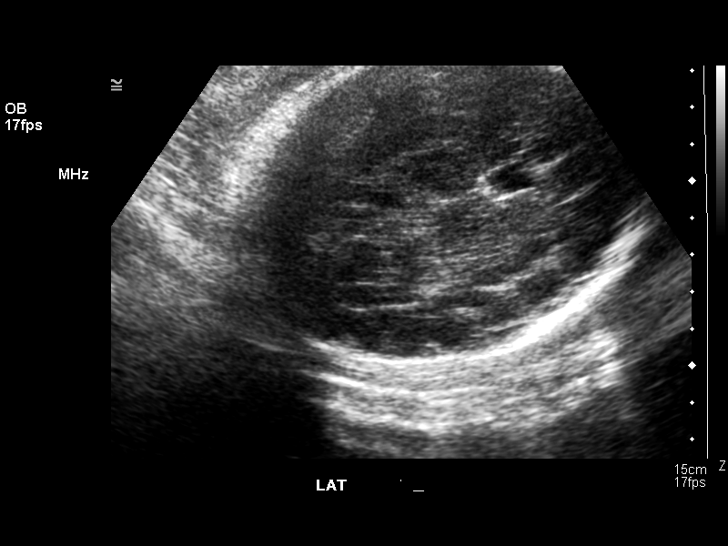

[14 of 22 positions shown; findings below may reference images not displayed]

BIOPHYSICAL PROFILE

Number of Fetuses:  1
Heart rate:  128
Movement:  Yes
Breathing:  Yes
Presentation:  Cephalic
Placental Location:  Anterior
Grade:  I
Previa:  No
Amniotic Fluid (Subjective):  Normal
Amniotic Fluid (Objective):  16.1 cm AFI (5th -95th%ile = 8.6 ? 24.2 cm for 32 wks)

Fetal measurements and complete anatomic evaluation were not requested.  The following fetal anatomy was visualized on this exam:  Lateral ventricles, stomach, kidneys and bladder.  

BPP SCORING
Movements:  2  Time:  10 minutes
Breathing:  2
Tone:  2
Amniotic Fluid:  2
Total Score:  8

MATERNAL UTERINE AND ADNEXAL FINDINGS
Cervix:  3.5 cm Transabdominally
IMPRESSION: 1.  Biophysical profile score [DATE].
2.  Subjectively and quantitatively normal amniotic fluid volume and normal cervical length.
3.  No late developing fetal anatomic abnormalities are identified associated with the lateral ventricles, stomach, kidneys or bladder.  A four chamber heart view could not be reassessed due to positioning on today?s exam.

## 2004-09-29 ENCOUNTER — Inpatient Hospital Stay (HOSPITAL_COMMUNITY): Admission: AD | Admit: 2004-09-29 | Discharge: 2004-09-29 | Payer: Self-pay | Admitting: Obstetrics and Gynecology

## 2004-09-29 IMAGING — US US FETAL BPP W/O NONSTRESS
1 series · 14 of 16 positions shown · non-contrast
Comparison: none

CLINICAL DATA: Non-reactive NST.  Assess fetal well-being.

[Series 1: us fetal bpp w/o nonstress · 0.35mm/px · 14 of 16 slices shown]
[im 1/16]
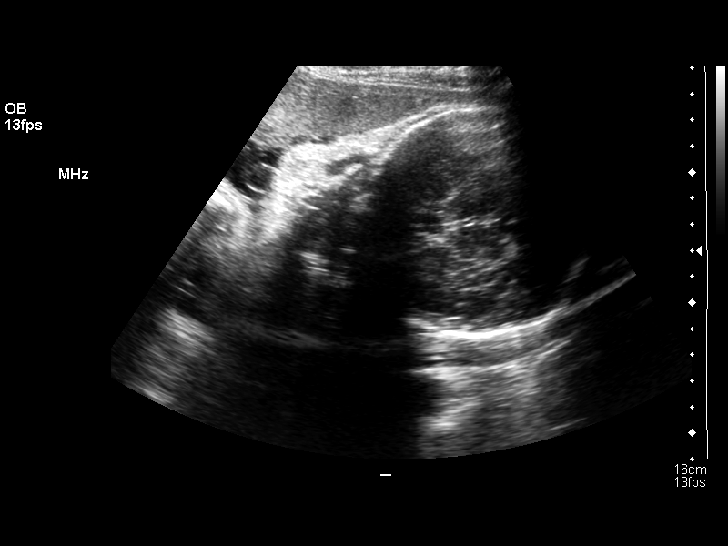
[im 2/16]
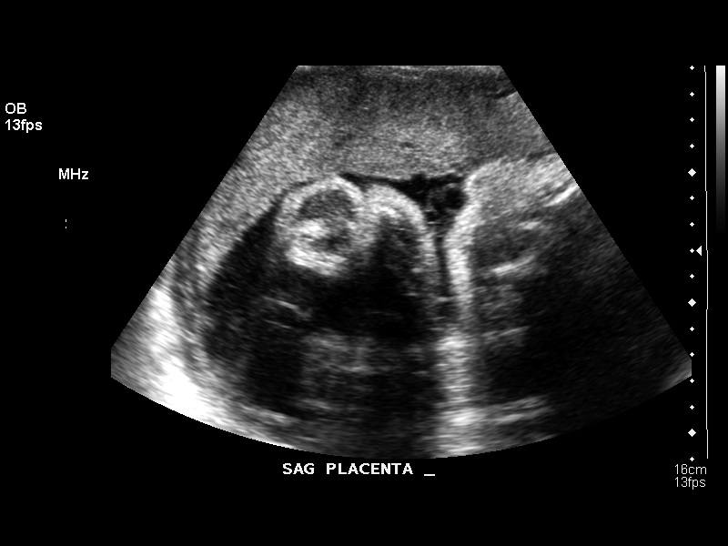
[im 3/16]
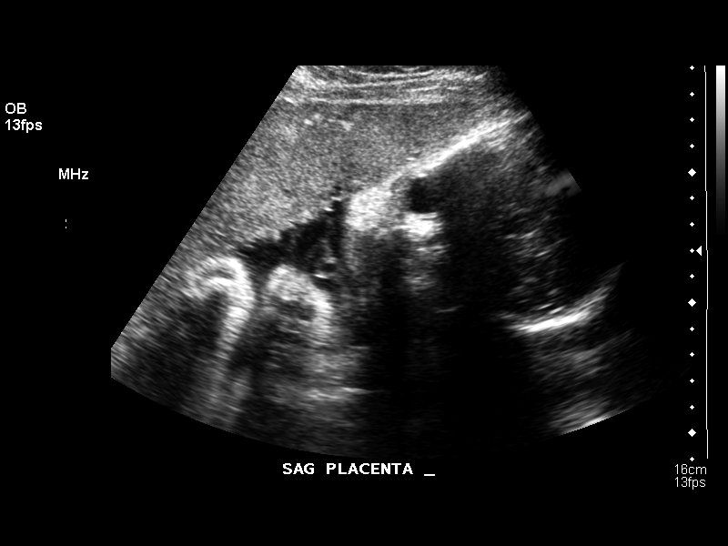
[im 5/16]
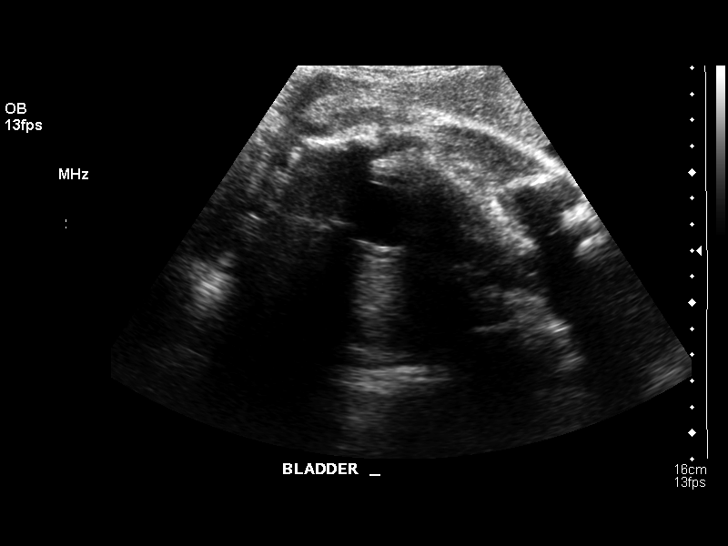
[im 6/16]
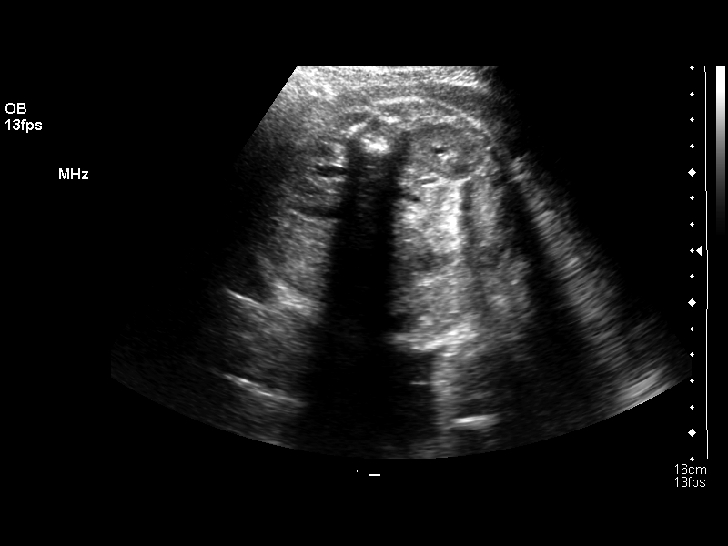
[im 7/16]
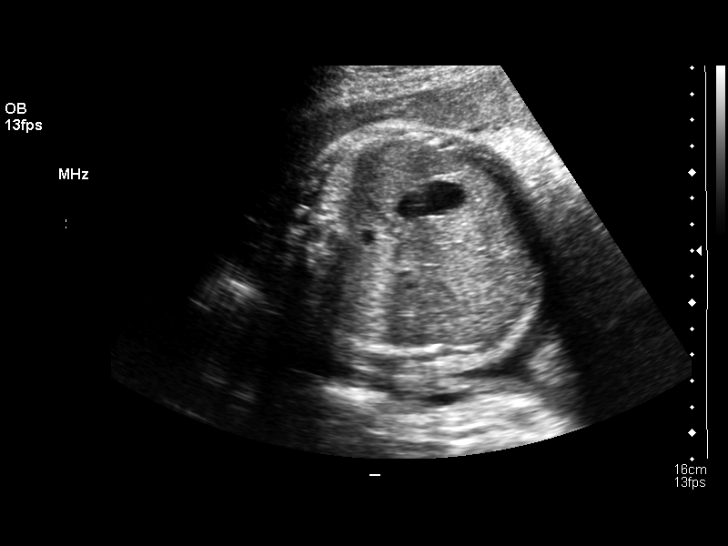
[im 8/16]
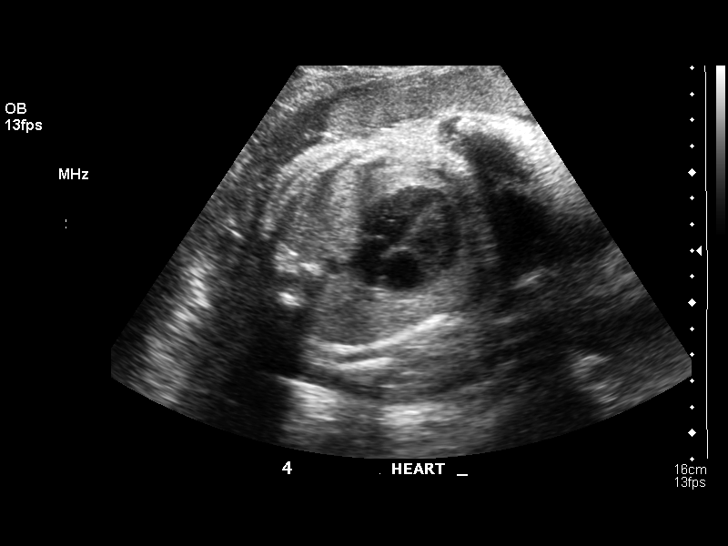
[im 9/16]
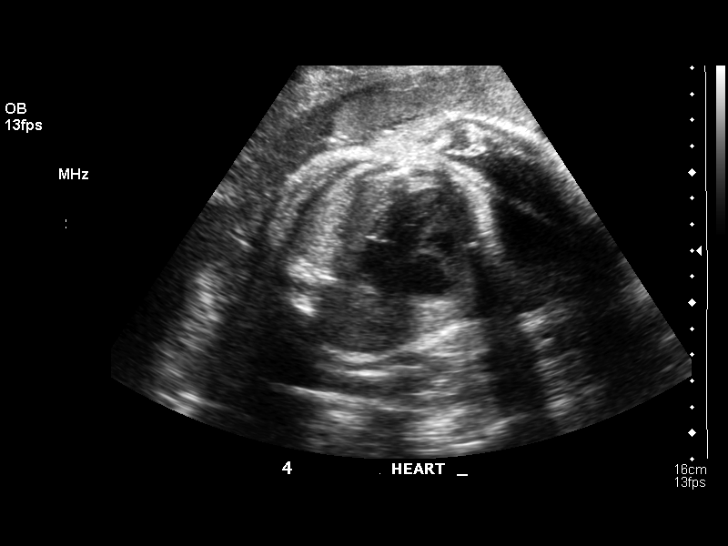
[im 10/16]
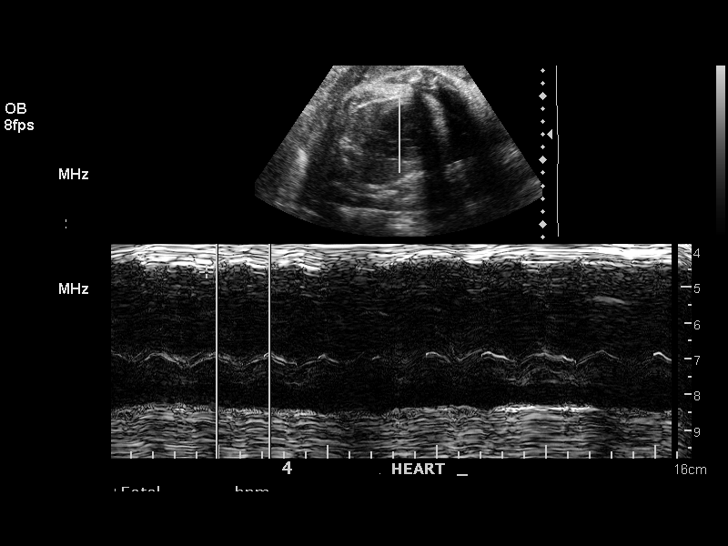
[im 11/16]
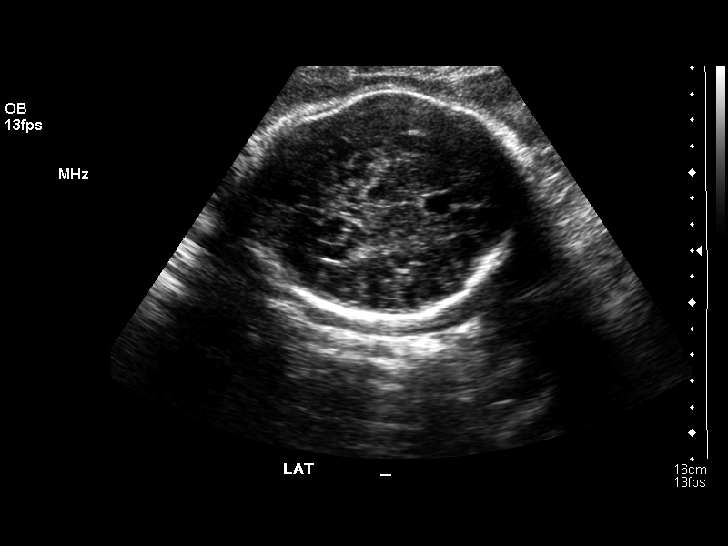
[im 13/16]
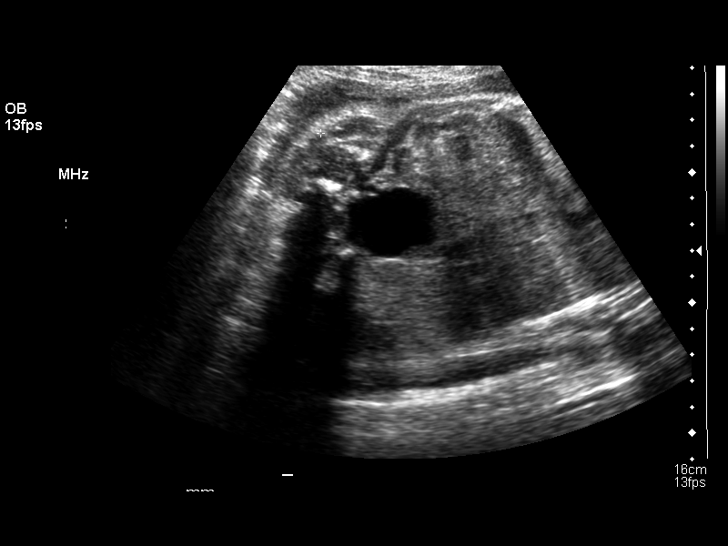
[im 14/16]
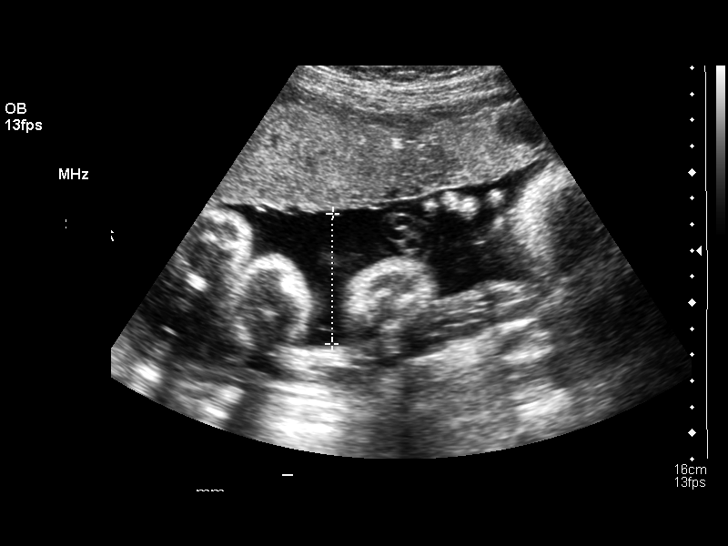
[im 15/16]
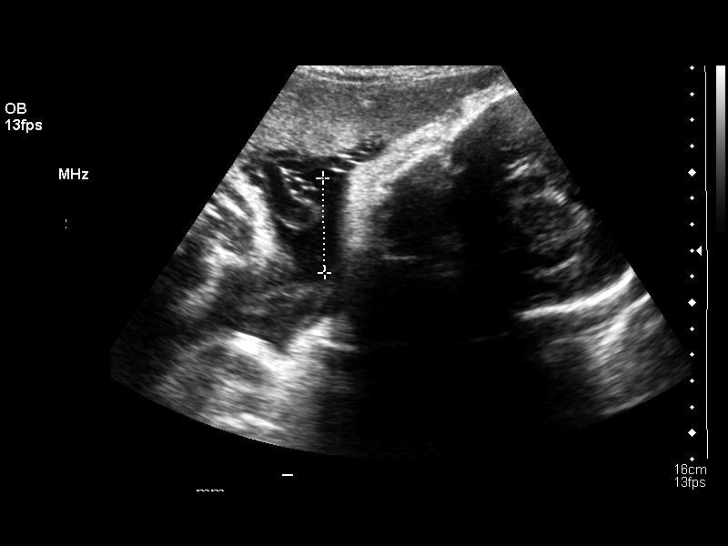
[im 16/16]
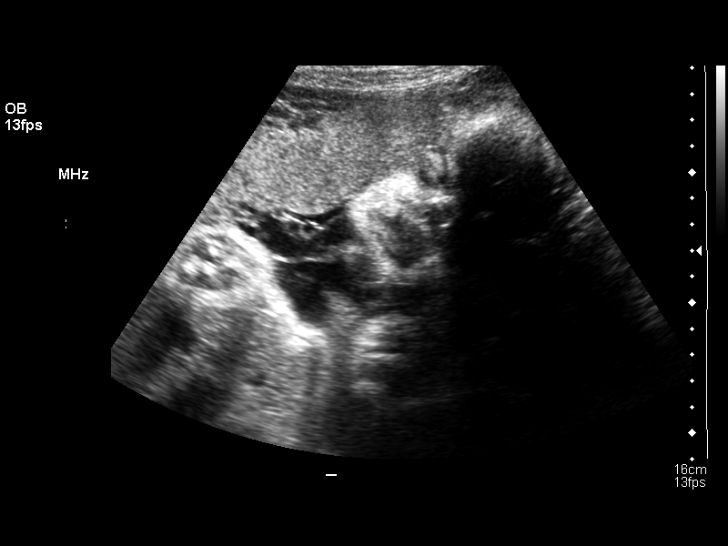

[14 of 16 positions shown; findings below may reference images not displayed]

BIOPHYSICAL PROFILE

Number of Fetuses:  1
Heart rate:  124
Movement:  Yes
Breathing:  Yes
Presentation:  Cephalic
Placental Location:  Anterior
Grade:  I
Previa:  No
Amniotic Fluid (Subjective):  Normal
Amniotic Fluid (Objective):  13.3 cm AFI (5th -95th%ile = 8.3 ? 24.5 cm for 33 wks)

Fetal measurements and complete anatomic evaluation were not requested.  The following fetal anatomy was visualized on this exam:  Lateral ventricles, four chamber heart, kidneys and bladder.

BPP SCORING
Movements:  2  Time:  10 minutes
Breathing:  2
Tone:  2
Amniotic Fluid:  2
Total Score:  8

MATERNAL UTERINE AND ADNEXAL FINDINGS
Cervix:  Not evaluated.
IMPRESSION: 1.  Biophysical profile score [DATE].
2.  Subjectively and quantitatively normal amniotic fluid. 
3.  No late developing fetal anatomic abnormalities are identified associated with the lateral ventricles, four chamber heart, stomach, kidneys or bladder.

## 2004-10-18 ENCOUNTER — Observation Stay (HOSPITAL_COMMUNITY): Admission: AD | Admit: 2004-10-18 | Discharge: 2004-10-19 | Payer: Self-pay | Admitting: Obstetrics and Gynecology

## 2004-10-18 IMAGING — US US AMNIOCENTESIS
1 series · 14 of 14 positions shown · non-contrast
Comparison: none

[Series 1: us amniocentesis · 0.33mm/px · 14 of 14 slices shown]
[im 1/14]
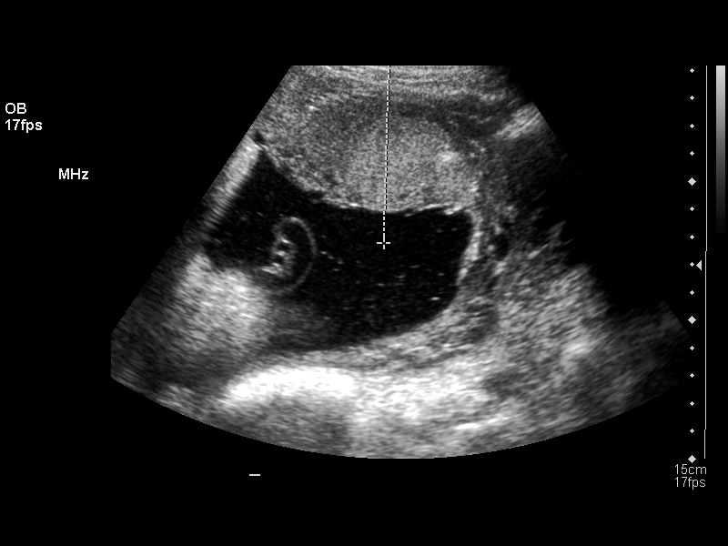
[im 2/14]
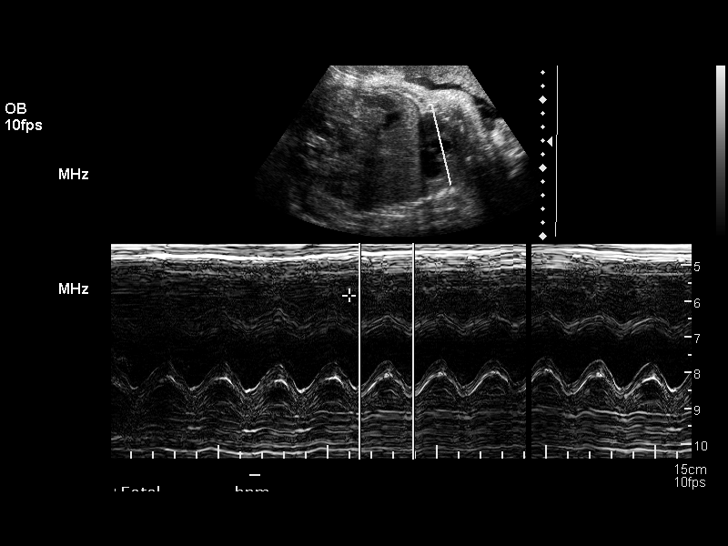
[im 3/14]
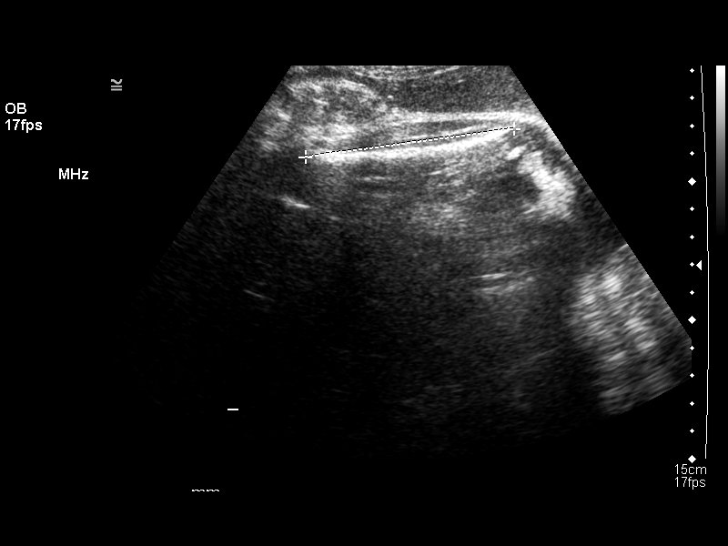
[im 4/14]
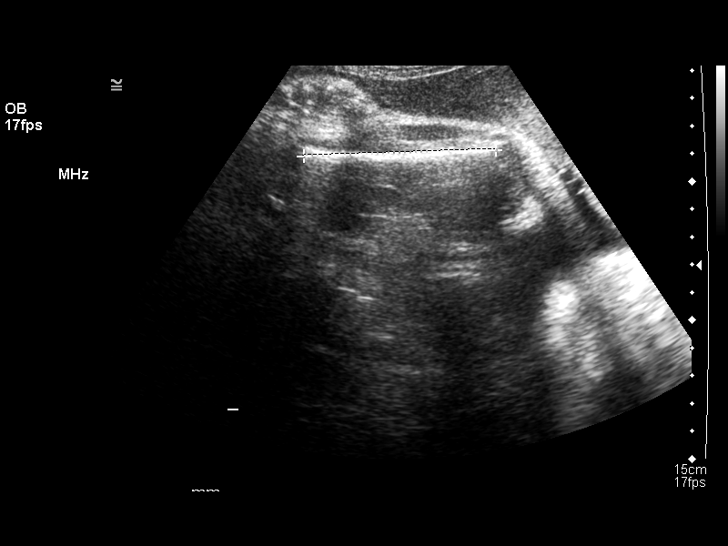
[im 5/14]
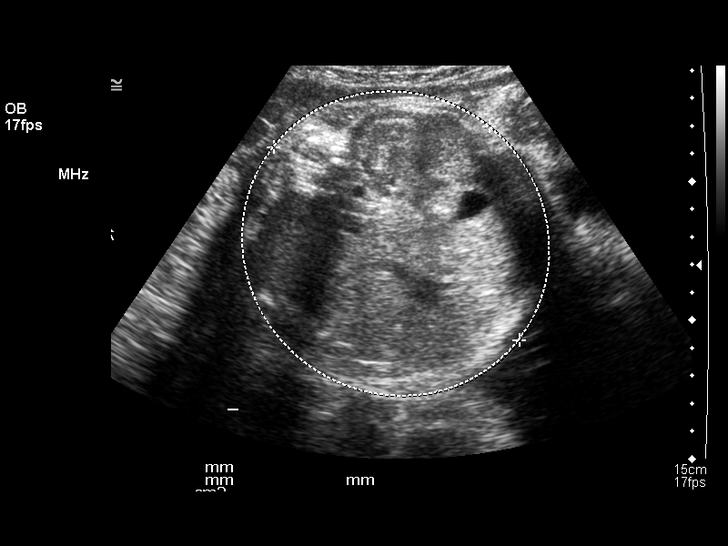
[im 6/14]
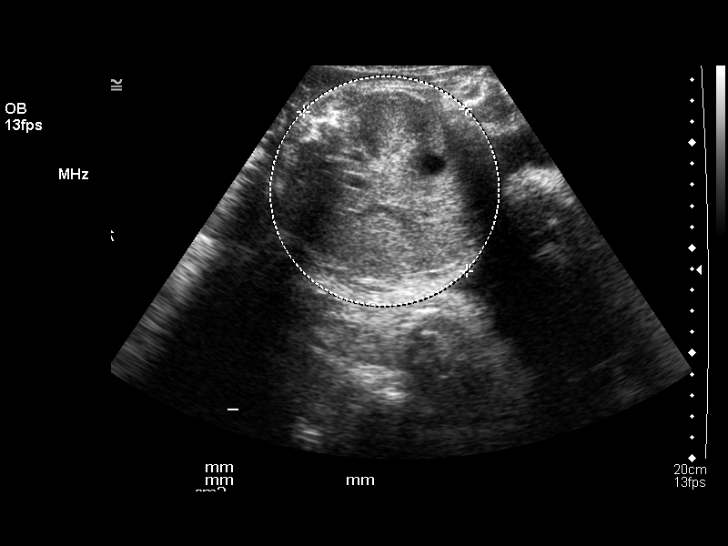
[im 7/14]
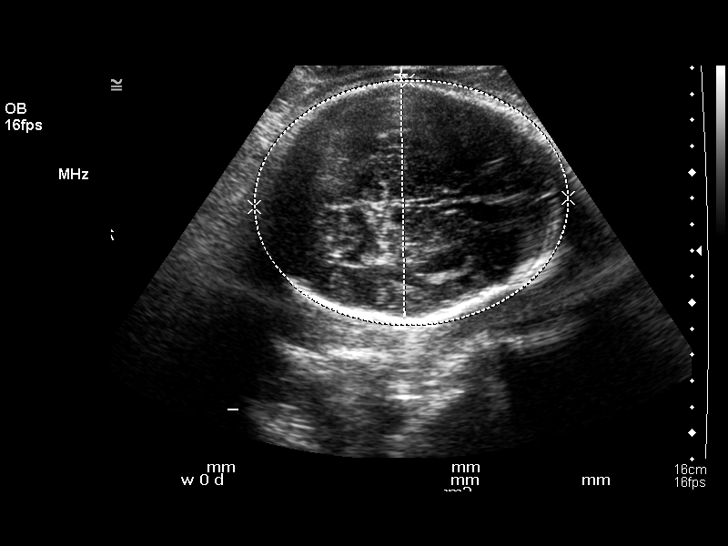
[im 8/14]
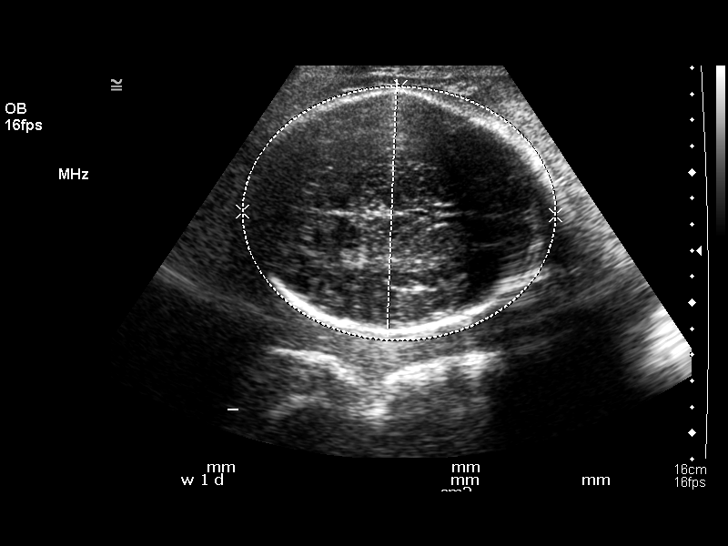
[im 9/14]
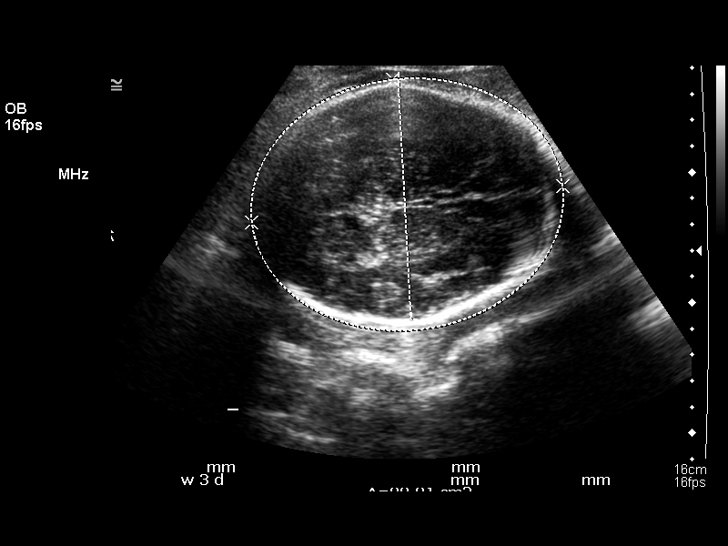
[im 10/14]
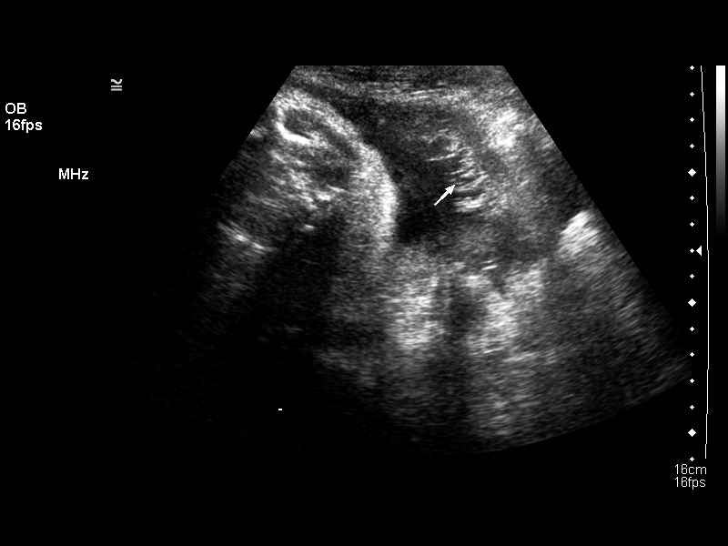
[im 11/14]
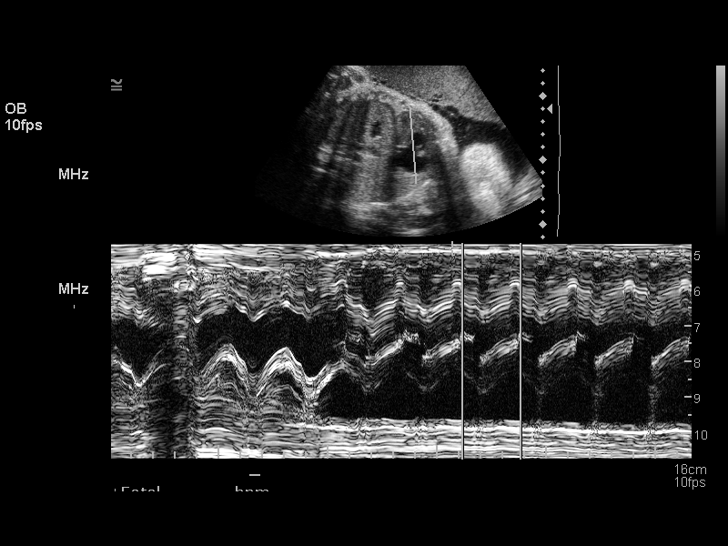
[im 12/14]
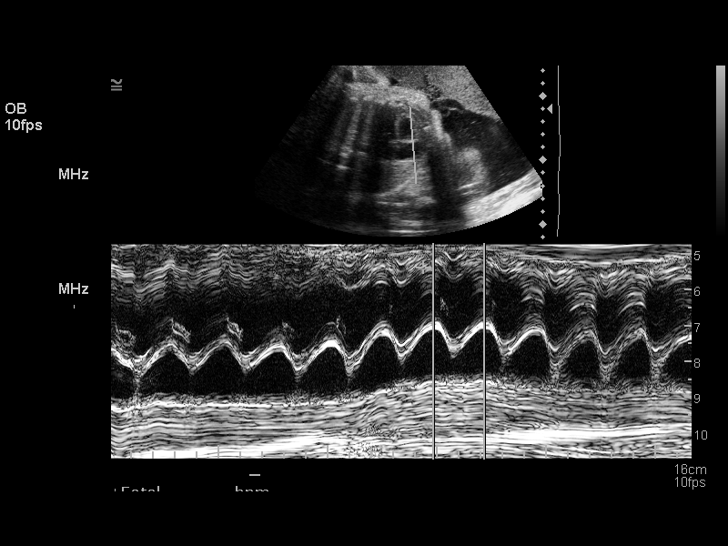
[im 13/14]
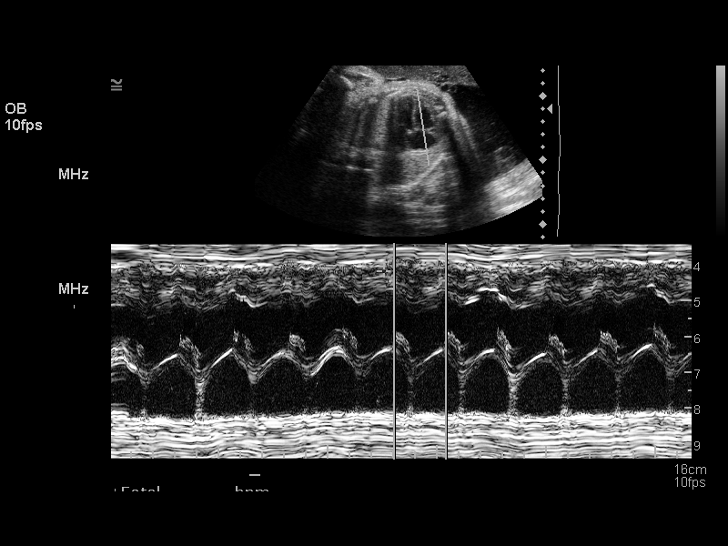
[im 14/14]
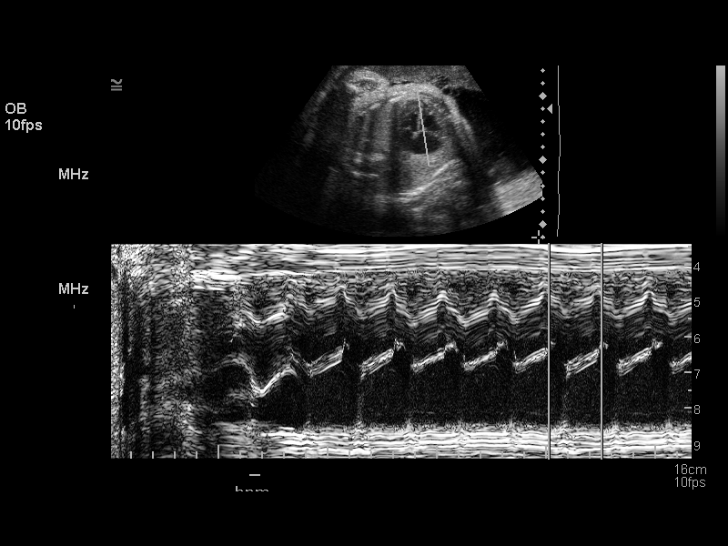

[14 of 14 positions shown; findings below may reference images not displayed]

ULTRASOUND AMNIOCENTESIS:

 Ultrasound was utilized to perform amniocentesis by the requesting physician.

## 2004-10-18 IMAGING — US US FETAL BPP W/O NONSTRESS
1 series · 18 of 20 positions shown · non-contrast
Comparison: none

CLINICAL DATA: 29-year-old.  G1 P0 with variables seen at the time of non-reactive stress test.

[Series 1: us fetal bpp w/o nonstress · non-contrast · 18 of 20 slices shown]
[im 1/20]
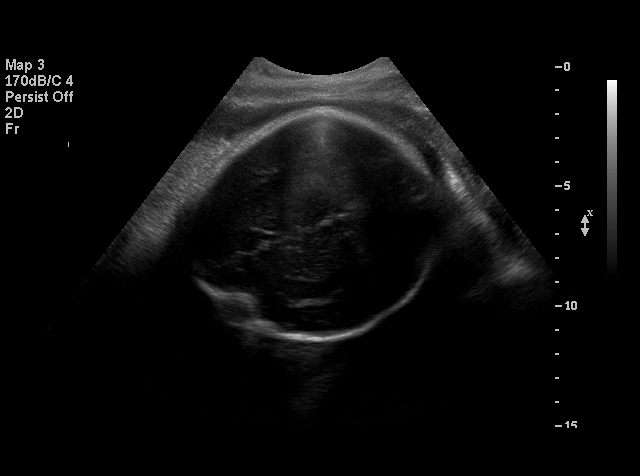
[im 2/20]
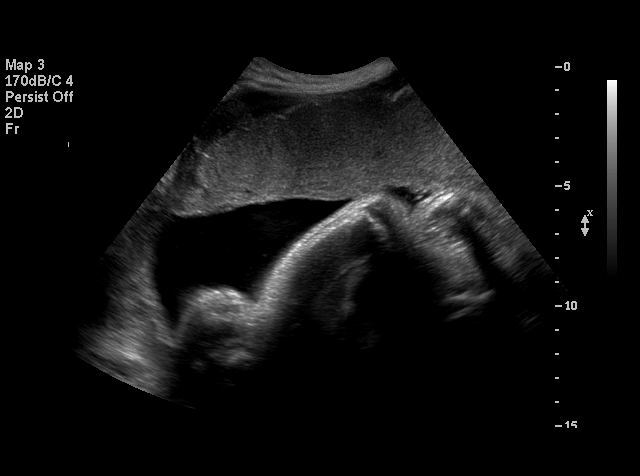
[im 3/20]
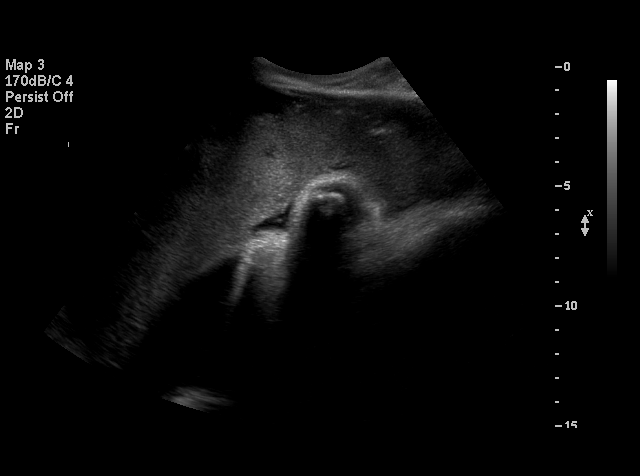
[im 4/20]
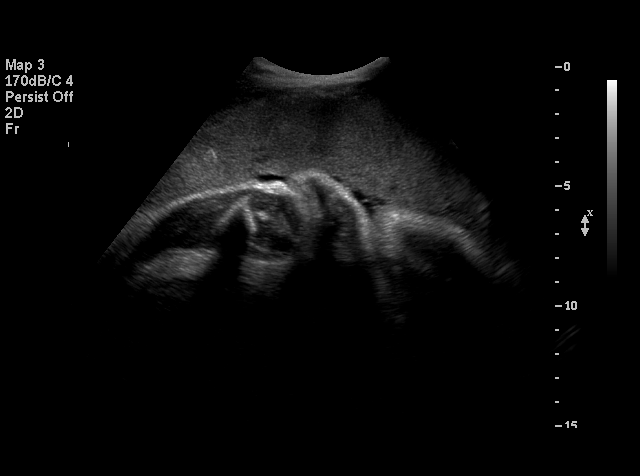
[im 6/20]
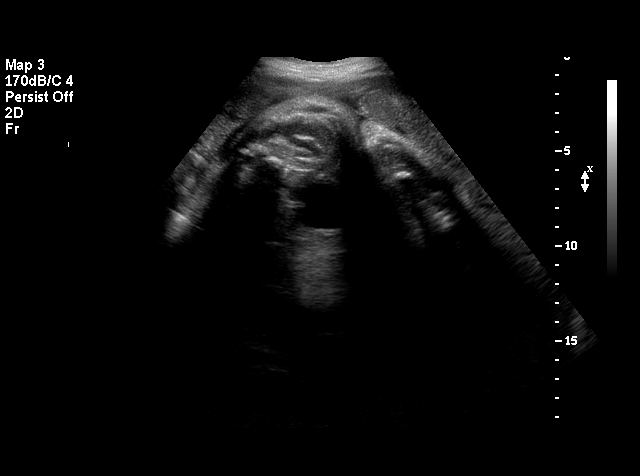
[im 7/20]
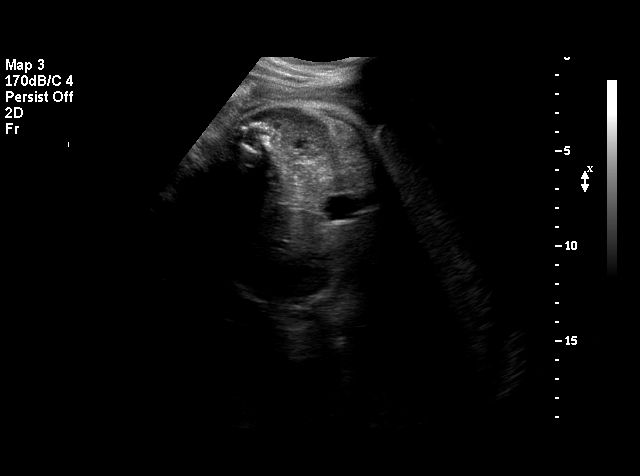
[im 8/20]
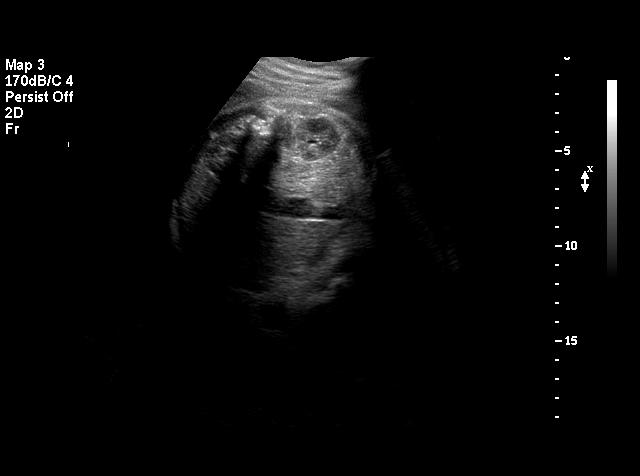
[im 9/20]
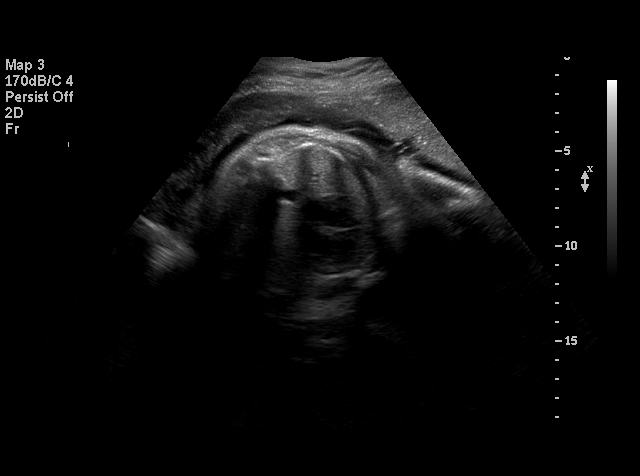
[im 10/20]
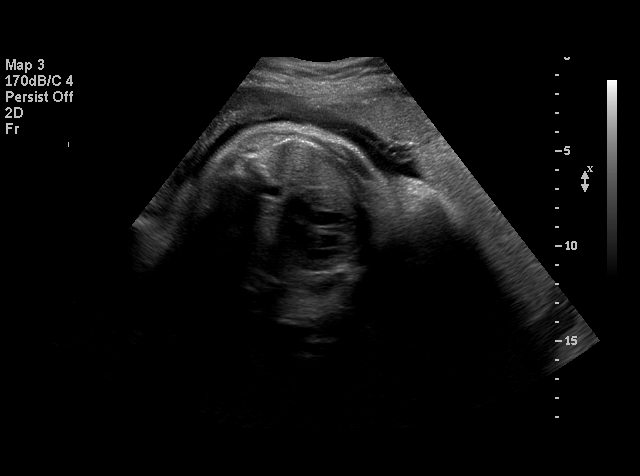
[im 11/20]
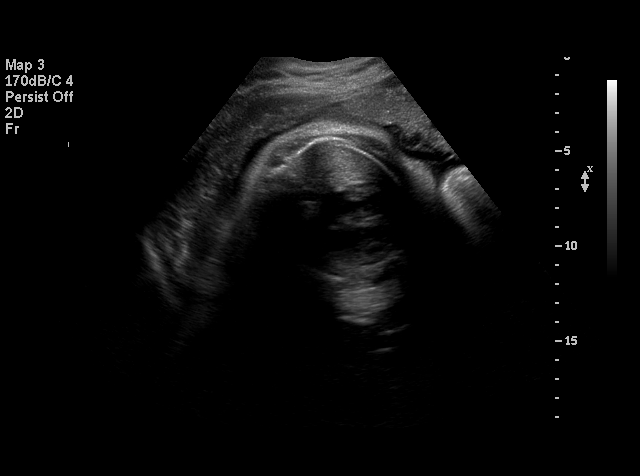
[im 12/20]
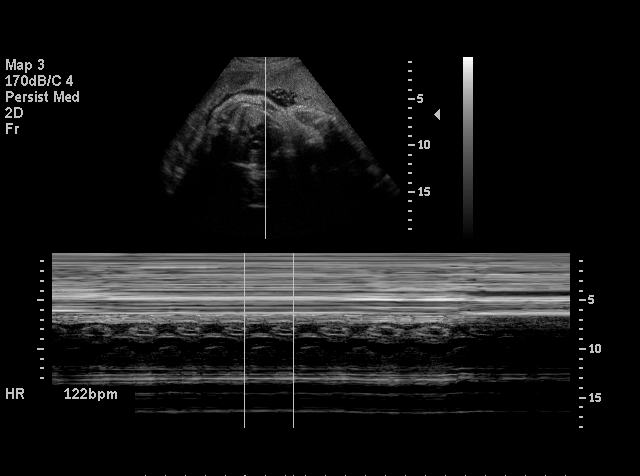
[im 13/20]
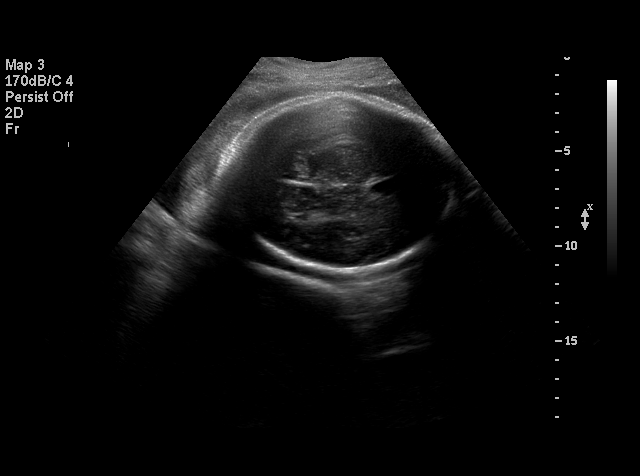
[im 14/20]
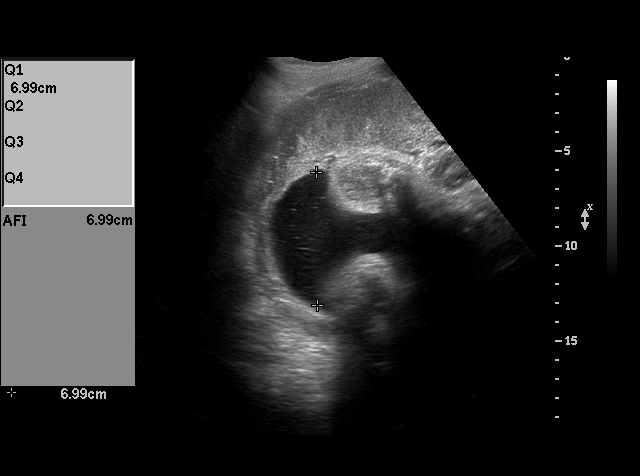
[im 16/20]
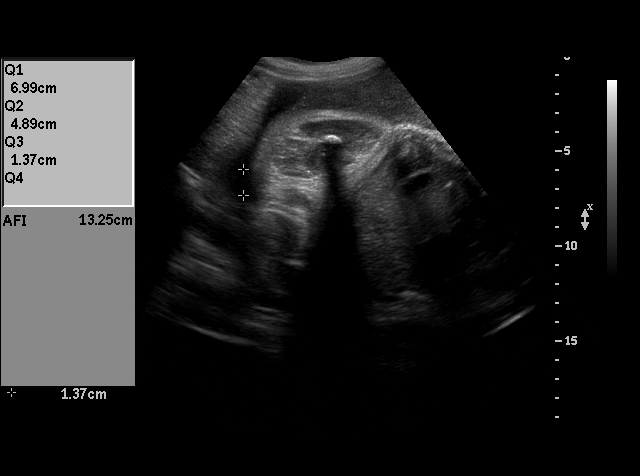
[im 17/20]
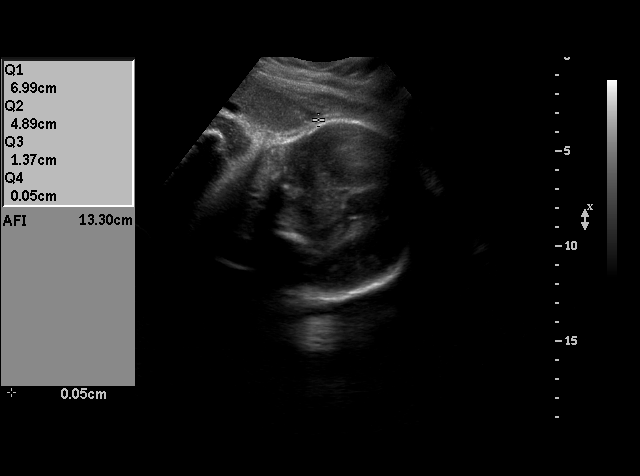
[im 18/20]
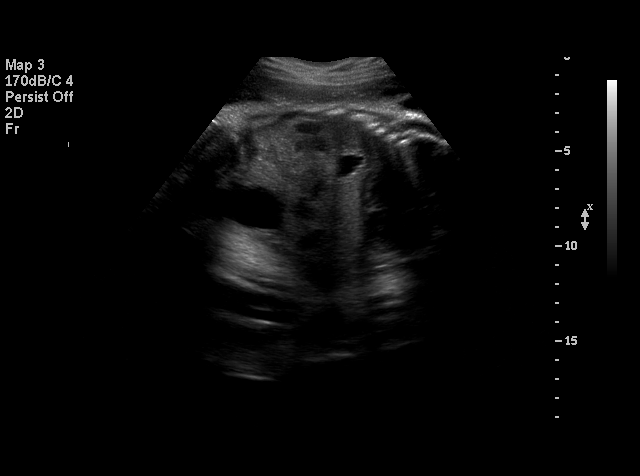
[im 19/20]
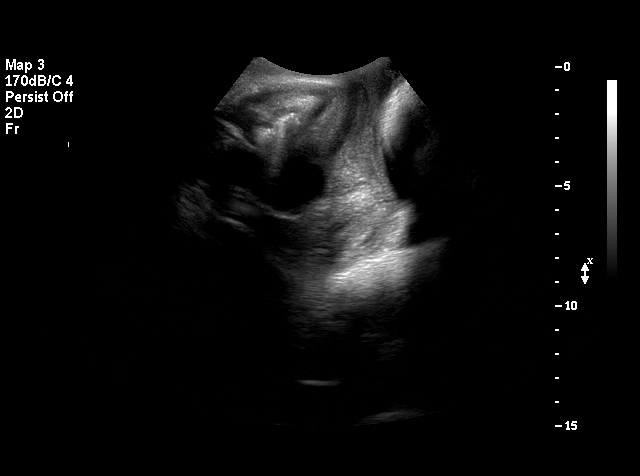
[im 20/20]
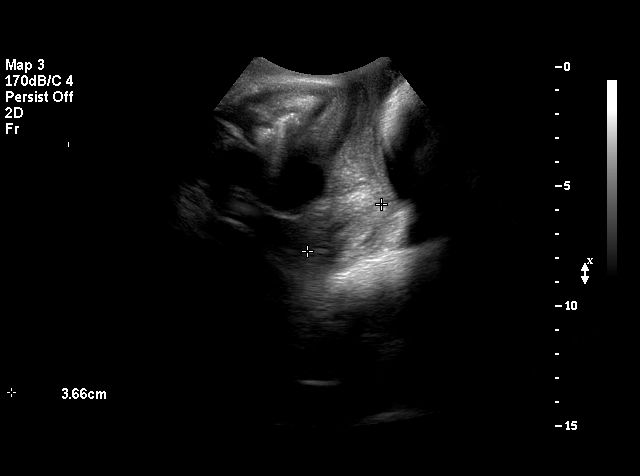

[18 of 20 positions shown; findings below may reference images not displayed]

BIOPHYSICAL PROFILE

 Number of Fetuses:  1
 Heart rate:  122
 Movement:  Yes
 Breathing:  Yes
 Presentation:  Cephalic
 Placental Location:  Anterior
 Grade:  I
 Previa:  No
 Amniotic Fluid (Subjective):  Normal
 Amniotic Fluid (Objective):  13.3 cm AFI (5th -95th%ile = 7.7 ? 24.9 cm for 36 wks)

 Fetal measurements and complete anatomic evaluation were not requested.  The following fetal anatomy was visualized on this exam:  Lateral ventricles, four chamber heart, stomach, kidneys and bladder.

 BPP SCORING
 Movements:  2  Time:  20 minutes
 Breathing:  2
 Tone:  2
 Amniotic Fluid:  2
 Total Score:  8

 MATERNAL UTERINE AND ADNEXAL FINDINGS
 Cervix:  3.6 cm Translabially
IMPRESSION: Single living intrauterine fetus in cephalic presentation.  Amniotic fluid volume is within normal limits.  Biophysical profile is 8 of 8.

## 2004-10-19 IMAGING — US US OB LIMITED
1 series · 5 of 5 positions shown · non-contrast
Comparison: none

CLINICAL DATA: Assess for possible repeat amniocentesis.

LIMITED OBSTETRICAL ULTRASOUND:
There is a single living intrauterine fetus.  The fetal heart rate is regular at a rate of 141 bpm.  Amniotic fluid volume is normal at 9.9 cm.

[Series 1: us ob limited · 0.41mm/px · 5 of 5 slices shown]
[im 1/5]
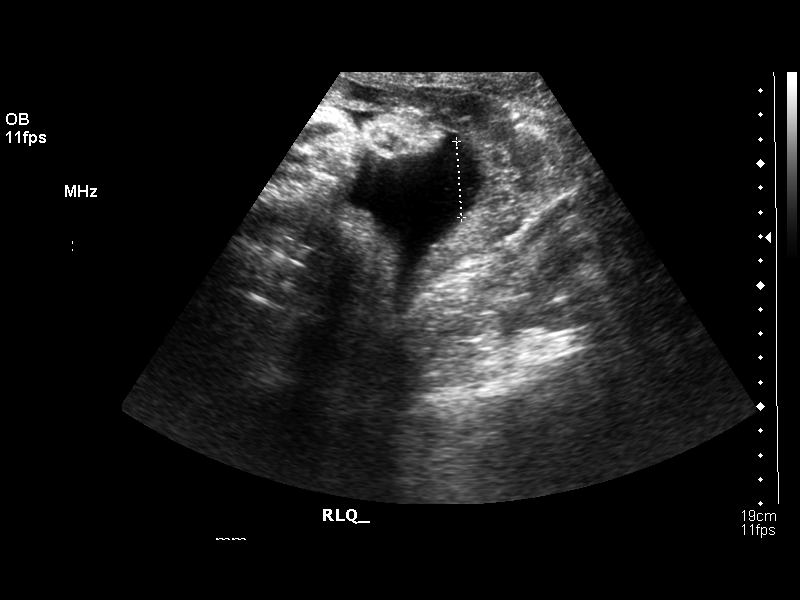
[im 2/5]
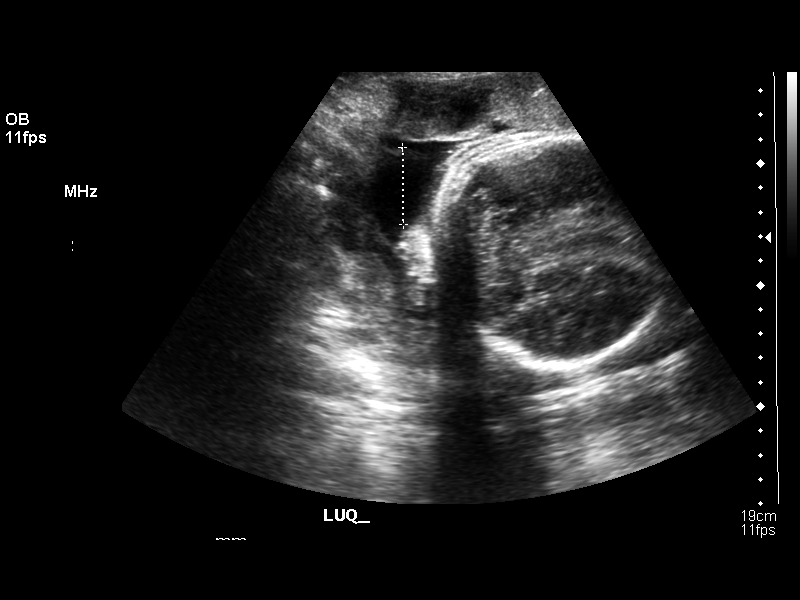
[im 3/5]
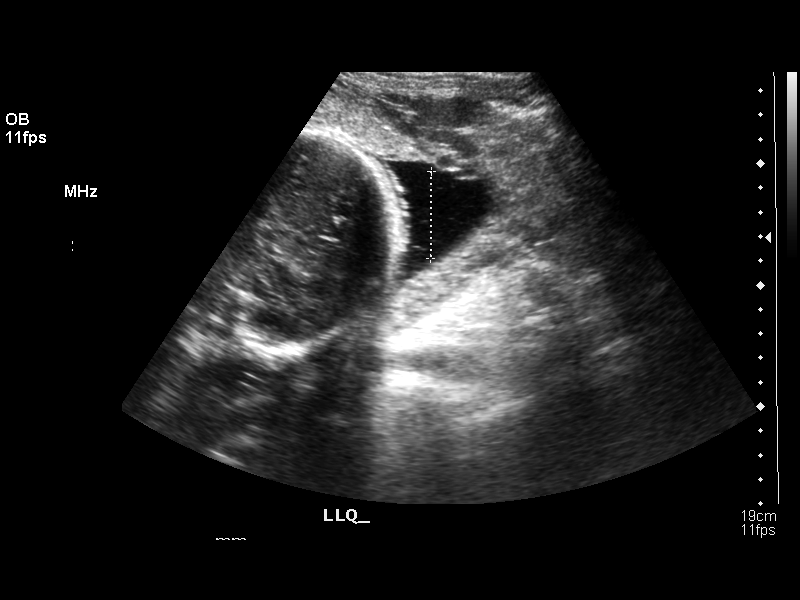
[im 4/5]
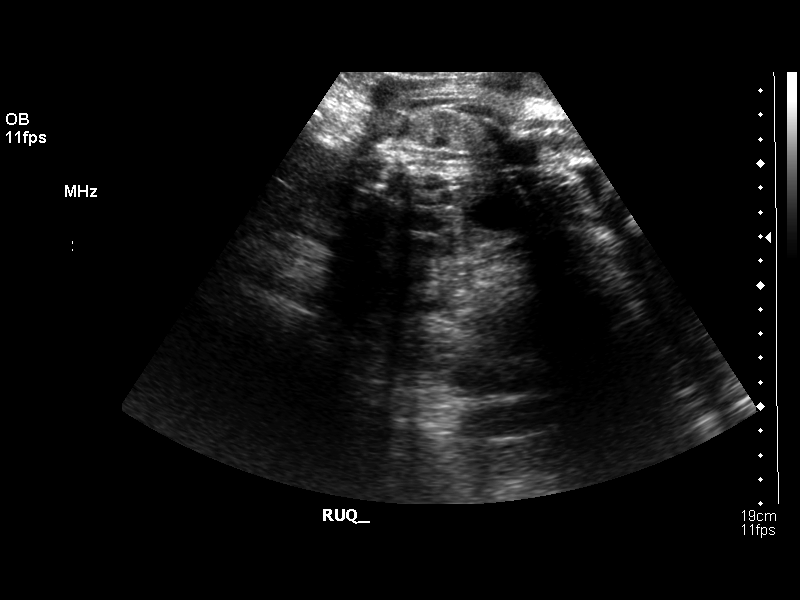
[im 5/5]
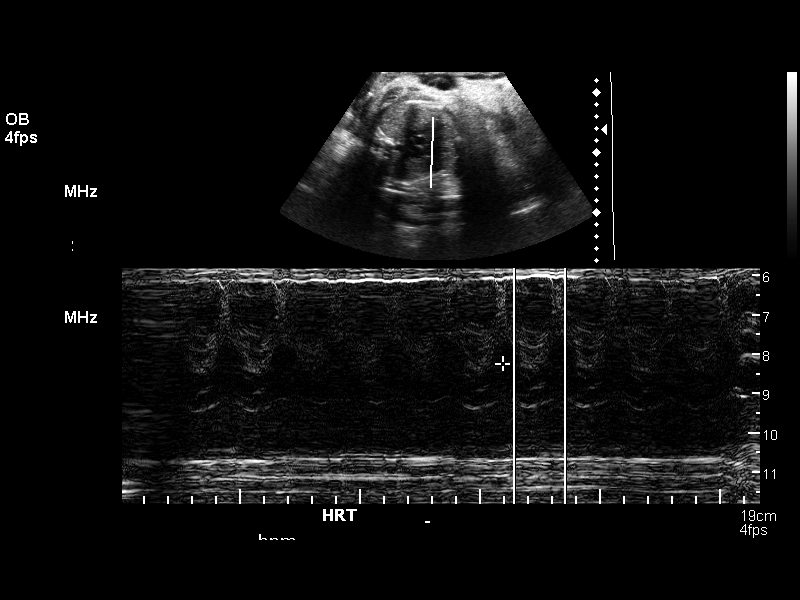

[5 of 5 positions shown; findings below may reference images not displayed]

IMPRESSION: Single living intrauterine fetus with normal AFI of 9.9 cm.  
  This study was performed as an on-call procedure and was reviewed by Dr. TIGER at the time it was performed.

## 2004-10-25 ENCOUNTER — Ambulatory Visit: Payer: Self-pay | Admitting: *Deleted

## 2004-10-25 ENCOUNTER — Ambulatory Visit (HOSPITAL_COMMUNITY): Admission: RE | Admit: 2004-10-25 | Discharge: 2004-10-25 | Payer: Self-pay | Admitting: Obstetrics and Gynecology

## 2004-10-25 IMAGING — US US FETAL BPP W/O NONSTRESS
1 series · 14 of 24 positions shown · non-contrast
Comparison: none

CLINICAL DATA: Non-reactive NST.  Assess fetal well-being.  G1 P0.  Patient is 36 weeks 4 days by LMP dating.

[Series 1: us fetal bpp w/o nonstress · 0.33mm/px · 14 of 24 slices shown]
[im 1/24]
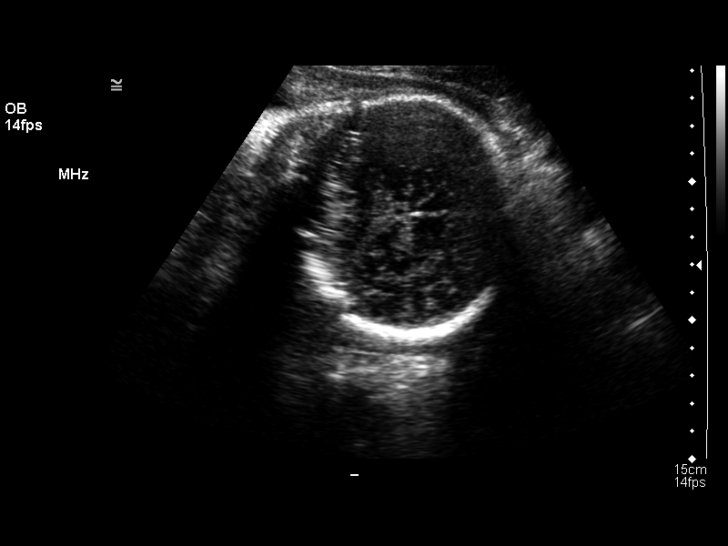
[im 3/24]
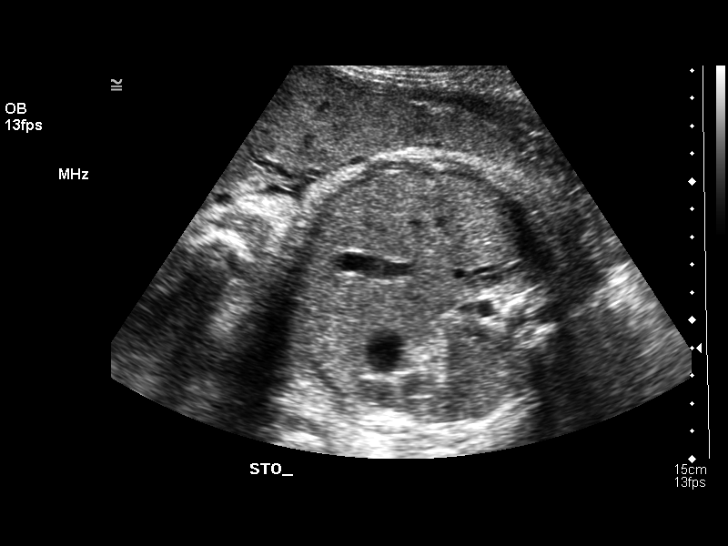
[im 5/24]
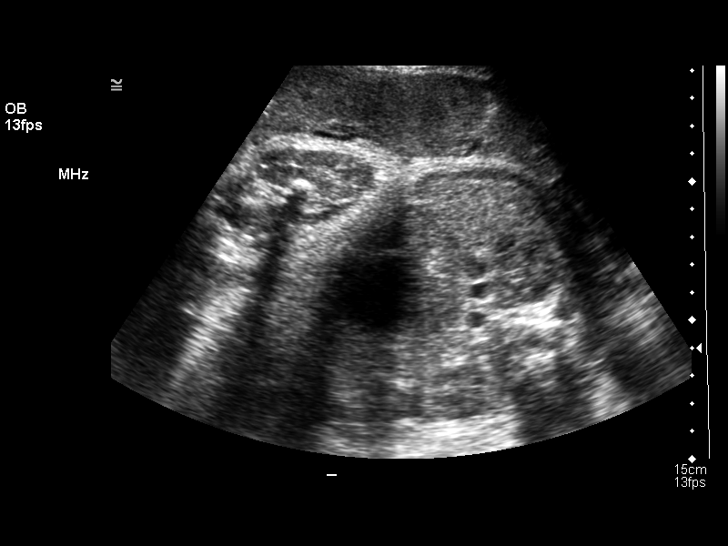
[im 7/24]
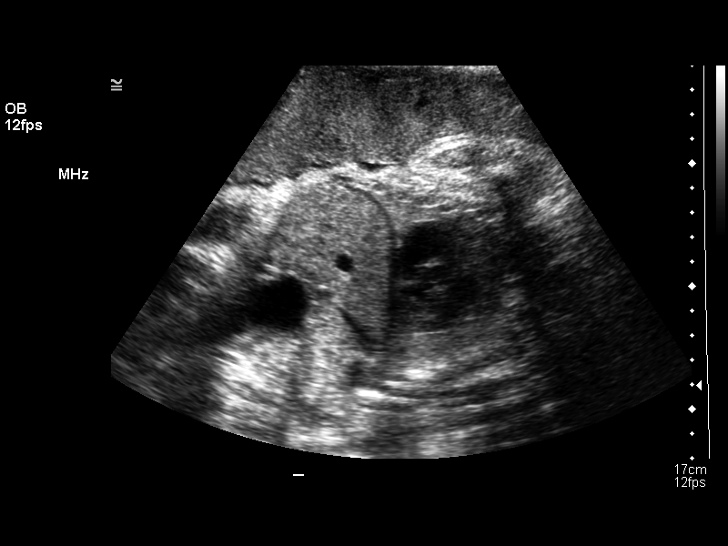
[im 8/24]
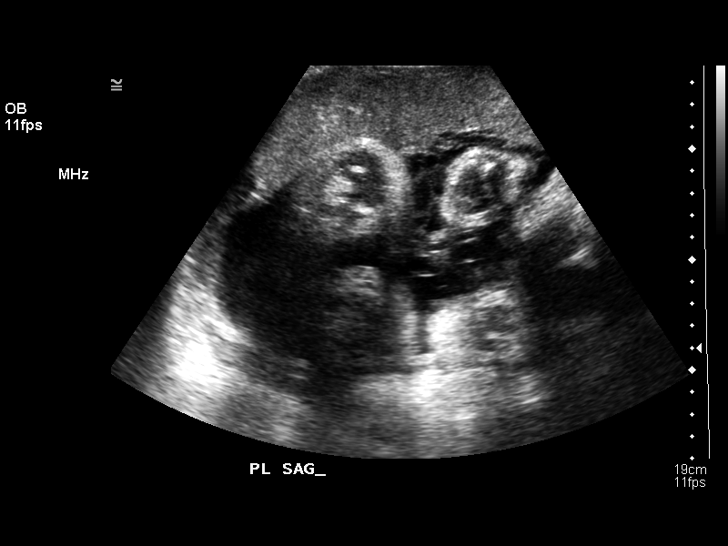
[im 10/24]
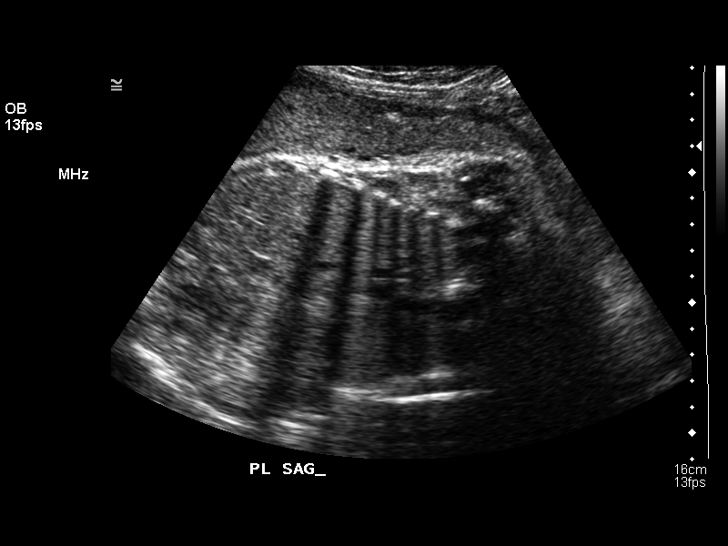
[im 12/24]
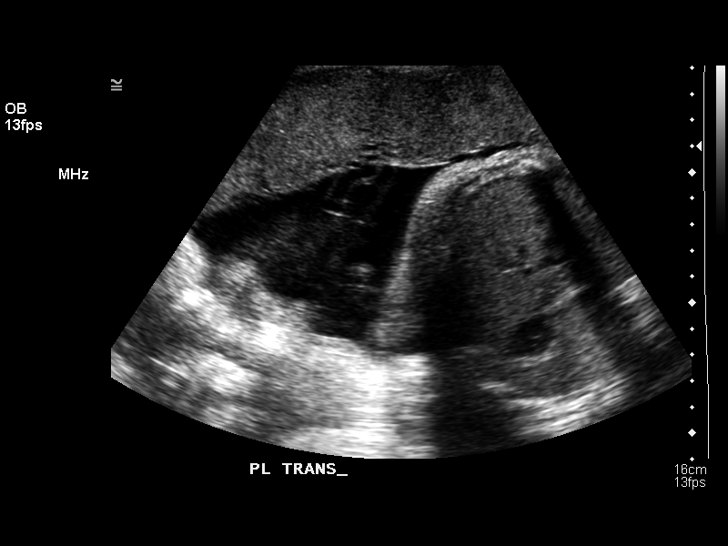
[im 13/24]
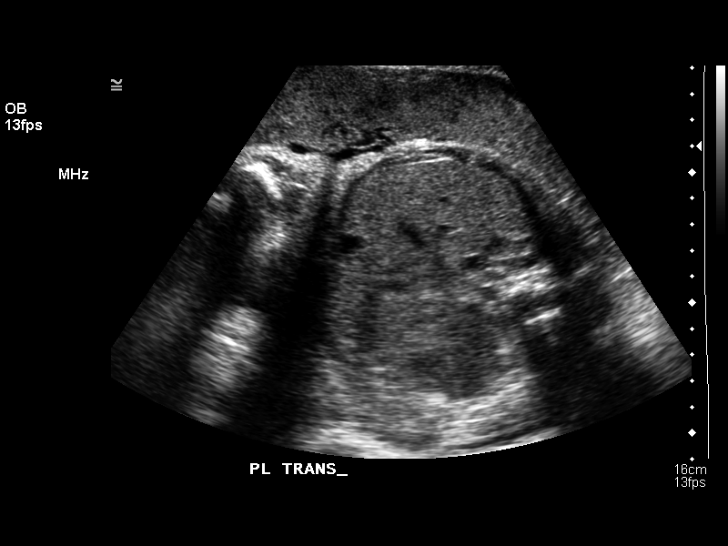
[im 15/24]
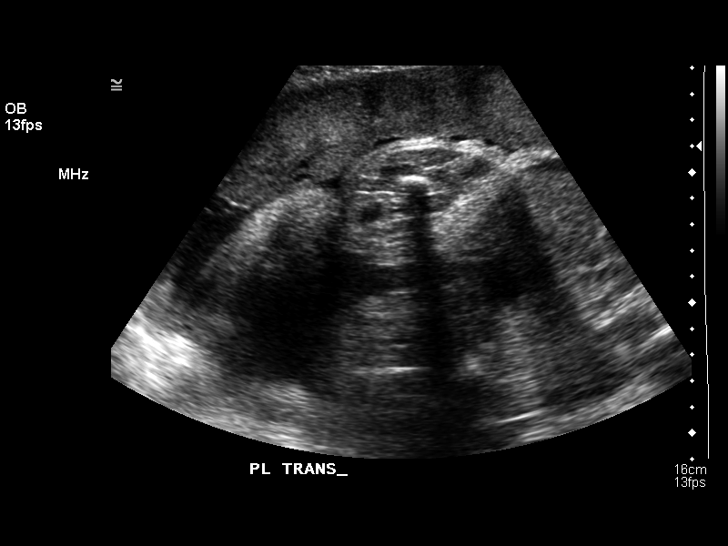
[im 17/24]
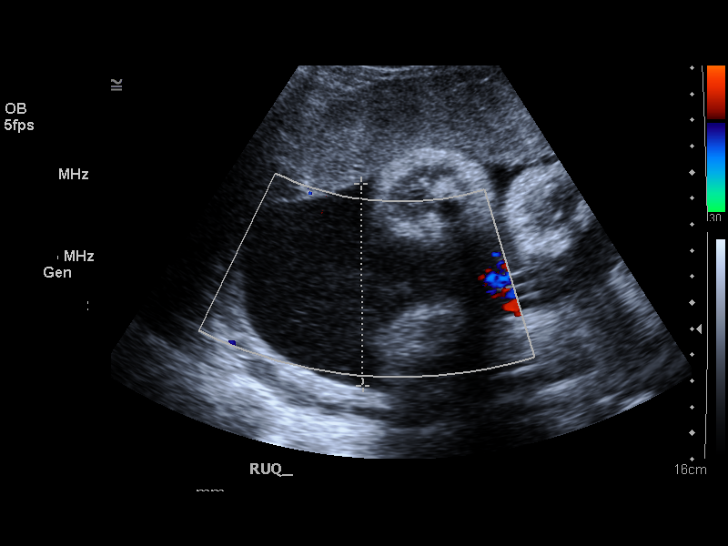
[im 19/24]
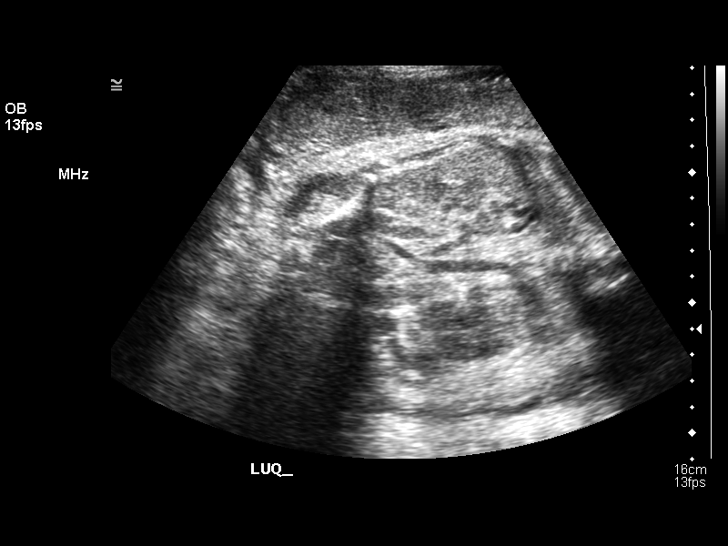
[im 20/24]
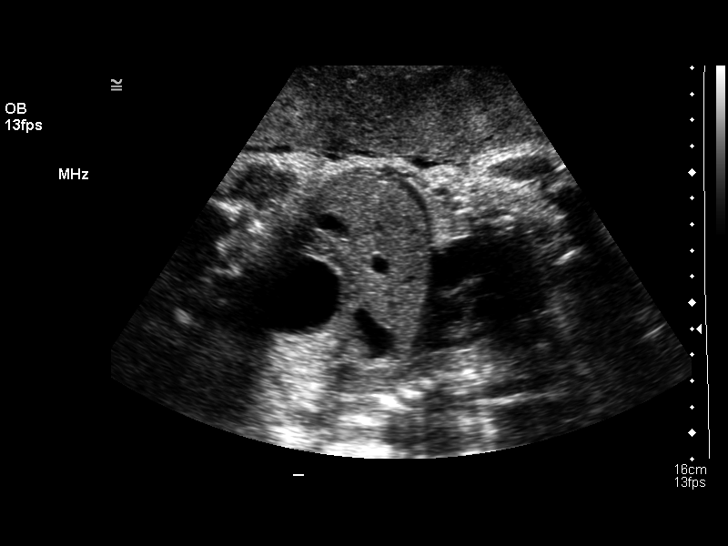
[im 22/24]
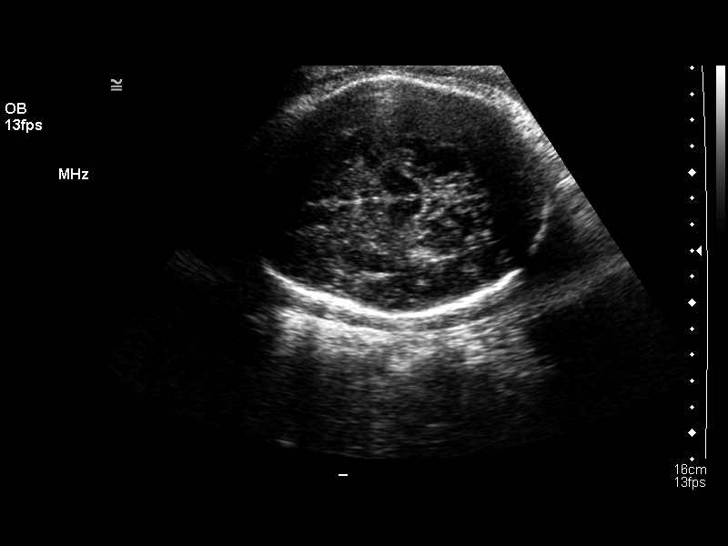
[im 24/24]
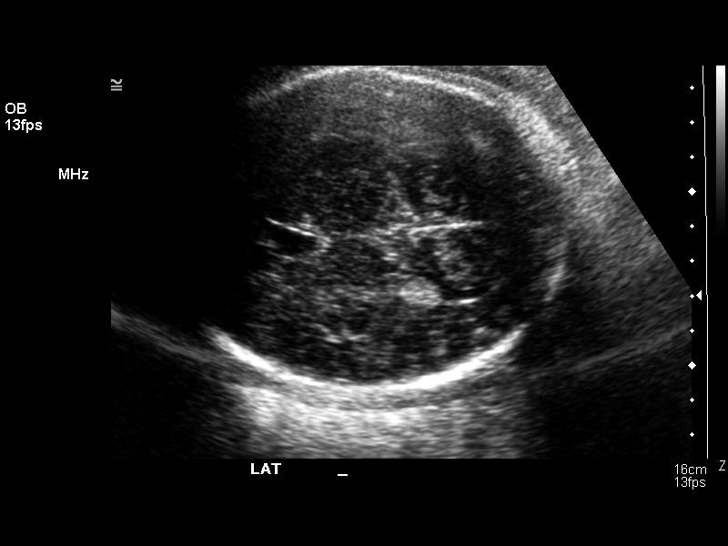

[14 of 24 positions shown; findings below may reference images not displayed]

BIOPHYSICAL PROFILE

 Number of Fetuses:  1
 Heart rate:  126
 Movement:  Yes
 Breathing:  Yes
 Presentation:  Cephalic
 Placental Location:  Anterior
 Grade:  I
 Previa:  No
 Amniotic Fluid (Subjective):  Normal
 Amniotic Fluid (Objective):  11.4 cm AFI (5th -95th%ile = 7.5 ? 24.4 cm for 37 wks)

 Fetal measurements and complete anatomic evaluation were not requested.  The following fetal anatomy was visualized on this exam:  Lateral ventricles, thalami, stomach, kidneys and bladder.  

 BPP SCORING
 Movements:  2  Time:  30 minutes
 Breathing:  2
 Tone:  2
 Amniotic Fluid:  2
 Total Score:  8

 MATERNAL UTERINE AND ADNEXAL FINDINGS
 Cervix:  Not evaluated.
IMPRESSION: 1.  Single living intrauterine fetus in cephalic presentation with biophysical profile score [DATE] in 30 minutes of scanning.  
 2.  Normal amniotic fluid volume with AFI 11.4 cm.

## 2004-10-27 ENCOUNTER — Inpatient Hospital Stay (HOSPITAL_COMMUNITY): Admission: AD | Admit: 2004-10-27 | Discharge: 2004-11-02 | Payer: Self-pay | Admitting: Obstetrics and Gynecology

## 2004-10-30 ENCOUNTER — Encounter (INDEPENDENT_AMBULATORY_CARE_PROVIDER_SITE_OTHER): Payer: Self-pay | Admitting: Specialist

## 2007-11-13 ENCOUNTER — Ambulatory Visit: Payer: Self-pay | Admitting: Sports Medicine

## 2007-11-13 DIAGNOSIS — IMO0002 Reserved for concepts with insufficient information to code with codable children: Secondary | ICD-10-CM | POA: Insufficient documentation

## 2007-12-18 ENCOUNTER — Ambulatory Visit: Payer: Self-pay | Admitting: Sports Medicine

## 2007-12-18 DIAGNOSIS — M2170 Unequal limb length (acquired), unspecified site: Secondary | ICD-10-CM

## 2007-12-18 DIAGNOSIS — M7750 Other enthesopathy of unspecified foot: Secondary | ICD-10-CM

## 2007-12-18 DIAGNOSIS — M21619 Bunion of unspecified foot: Secondary | ICD-10-CM | POA: Insufficient documentation

## 2007-12-18 HISTORY — DX: Bunion of unspecified foot: M21.619

## 2007-12-18 HISTORY — DX: Unequal limb length (acquired), unspecified site: M21.70

## 2007-12-18 HISTORY — DX: Other enthesopathy of unspecified foot and ankle: M77.50

## 2008-01-23 ENCOUNTER — Ambulatory Visit: Payer: Self-pay | Admitting: Psychology

## 2008-06-22 ENCOUNTER — Emergency Department (HOSPITAL_COMMUNITY): Admission: EM | Admit: 2008-06-22 | Discharge: 2008-06-22 | Payer: Self-pay | Admitting: Emergency Medicine
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
## 2008-12-23 ENCOUNTER — Encounter: Admission: RE | Admit: 2008-12-23 | Discharge: 2008-12-23 | Payer: Self-pay | Admitting: Obstetrics and Gynecology

## 2009-07-24 HISTORY — PX: DILATION AND CURETTAGE OF UTERUS: SHX78

## 2009-08-25 ENCOUNTER — Ambulatory Visit: Payer: Self-pay | Admitting: Sports Medicine

## 2009-10-20 ENCOUNTER — Ambulatory Visit: Payer: Self-pay | Admitting: Sports Medicine

## 2009-12-23 ENCOUNTER — Ambulatory Visit (HOSPITAL_COMMUNITY): Admission: RE | Admit: 2009-12-23 | Discharge: 2009-12-23 | Payer: Self-pay | Admitting: Obstetrics and Gynecology

## 2009-12-23 HISTORY — PX: LEEP: SHX91

## 2010-08-25 NOTE — Assessment & Plan Note (Signed)
Summary: F/U SHIN SPLINTS,MC   Vital Signs:  Patient profile:   35 year old female BP sitting:   96 / 64  Vitals Entered By: Lillia Pauls CMA (October 20, 2009 9:56 AM)  History of Present Illness: Running 4 miles outdoors every other day. Rides bike on other days of the week. Pain decreased by 20%. Still having pain throughout runs. No weakness or paresthesias. Equiovcal regarding anterior shin swelling.   Physical Exam  General:  Well-developed,well-nourished,in no acute distress; alert,appropriate and cooperative throughout examination Msk:  Markedly increased hip abduction strength.  Mod ttp mid anterior tib bilaterally. No apparent defect, swelling, or discoloration. Pain mildly reproduced on R & L unilateral hop testing.   Full ROM and strength wrt ankles and toes; without pain; though left inversion and planterflexion strength slightly decreased.  Normal nv examination.  Running: Initially running gingerly; exhibitis more of a normal form after first few steps.  Longitudinal and transverse ultrasound views of the anterior tibias did not reveal any abnormalities.   Impression & Recommendations:  Problem # 1:  SPRAIN&STRAIN OF UNSPECIFIED SITE OF KNEE&LEG (ICD-844.9) Assessment Improved Pain likely of biomechanical origin. Doubt bilateral stress fracture given 4 yr history of bilateral shin pain and physical exam findings.  - Continue hip exercises (flexion, abduction, adduction) and pidgeon-toe step-ups 3 days weekly. - Start daily with heel walks, toe walks, drop squats, and lunges with 20 lbs of weight. - Ice shins after runs. - Avoid extreme changes exercise routines. - Consider calcium and vitamin D supplementation to help prevent stress fracture. - Continue to use orthotics, which were modifief with lateral foam posts today. - Will arrange for compression sleeve for use around one of her shins for comparison purposes. Patient to note whether compression sleeve  alleviates symptoms. - RTC in 3 mths; sooner prn pain. Might consider construction of orthotics with thicker cushioning if no significant improvement.  Note - she has lost 16 lbs w running and very reluctant to stop!  Problem # 2:  METATARSALGIA (ICD-726.70)  - Continue current plan.  Problem # 3:  UNEQUAL LEG LENGTH (ICD-736.81)  - Continue current plan.  Problem # 4:  BUNIONS, LEFT FOOT (ICD-727.1)  - Continue current plan.  Appended Document: F/U [facility]

## 2010-08-25 NOTE — Assessment & Plan Note (Signed)
Summary: SHIN SPLINTS,RUNNER,MC   Vital Signs:  Patient profile:   35 year old female Height:      63 inches Weight:      150 pounds BMI:     26.67 Pulse rate:   67 / minute BP sitting:   105 / 74  Vitals Entered By: Lillia Pauls CMA (August 25, 2009 10:56 AM)  History of Present Illness: Patient seen with shin pain in running program 18 mos ago got some better with orthotics but still had pain decided to return to running program now left shin hurting again RT seems less so can't tell much difference with custom orthotics now Note she did get relief with temp orthotics and arch support  also has a painful bunion on RT left was repaired and did well and is pain free  Physical Exam  General:  Well-developed,well-nourished,in no acute distress; alert,appropriate and cooperative throughout examination Msk:  tnederness along medial post border of left tibia some nodularity noted here as well on RT tibia there is some nodularity but no tenderness hop test neg good strength post tib note weakness of hip abduction only on left left leg is at least 1 cm shorter Extremities:  running gait is good with slight intoeing and mild pronation; However, without lift correction she trendelenburgs to left side  note with lift this is corrected with wedged felt lift and improves her gait   Impression & Recommendations:  Problem # 1:  SPRAIN&STRAIN OF UNSPECIFIED SITE OF KNEE&LEG (ICD-844.9)  still with chronic shin pain begin exercise regimen for post tib also add hip abduction and rotation step ups  ice after running   use sports insoles with felt lift with medial support on left heel wedge on RT bring orthotics so we can wedge heel and ck these  reck 4 wks  Orders: Sports Insoles ([id number])  Problem # 2:  UNEQUAL LEG LENGTH (ICD-736.81)  needs to cont correction as this does shift her running gait  Orders: Sports Insoles ([id number])  Problem # 3:  BUNIONS, LEFT FOOT  (ICD-727.1)  this one was repaired   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

## 2010-10-10 LAB — CBC: WBC: 4.2 10*3/uL (ref 4.0–10.5)
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
## 2010-12-09 NOTE — Discharge Summary (Signed)
Anne Jefferson, Anne Jefferson             ACCOUNT NO.:  [id number]   MEDICAL RECORD NO.:  [id number]          PATIENT TYPE:  INP   LOCATION:  9118                          [facility]  [facility]   PHYSICIAN:  Naima A. Dillard, M.D. DATE OF BIRTH:  04-17-1976   DATE OF ADMISSION:  10/27/2004  DATE OF DISCHARGE:  11/02/2004                                 DISCHARGE SUMMARY   ADMISSION DIAGNOSIS:  1.  Intrauterine pregnancy at 37 weeks.  2.  History of fetal heart rate variable.  3.  Positive group B Strep.   DISCHARGE DIAGNOSIS:  1.  Intrauterine pregnancy at 37 weeks.  2.  Nonreassuring fetal heart rate changes since 32 weeks.  3.  Failed induction.   PROCEDURE:  1.  Primary low transverse cesarean section.  2.  Spinal anesthesia.   HOSPITAL COURSE:  [name] is a 35 year old gravida 1, para 0, at [redacted]  weeks gestation who was admitted on October 27, 2004, for induction of labor  due to a history of fetal heart rate variables noted on her monitor tracing  sporadically since about [redacted] weeks gestation.  She had noted decreased fetal  movement at that time, was placed on the monitor, and subsequently was noted  to have some sporadic variables.  She was followed with NSTs and biophysical  profiles since that time which have all been within normal limits.  Estimated fetal weight was in the 90th to 95th percentile, 32 weeks, and she  has had normal Doppler studies.  Her pregnancy has, otherwise, been  remarkable for RH negative, positive group B Strep, history of asthma with  no medications required during this pregnancy, fetal heart rate variables  noted at times on the monitor tracing.   She was admitted on the evening of October 27, 2004, with Cervidil placed.  The  patient understood that this could be a serial induction due to her cervix  being closed, 70%, vertex -1.  Fetal heart rate was reassuring and reactive.  The patient was having some very occasional contractions.  Cervidil was  placed the  evening of April 6, was maintained until the next morning for 12  hours total, at which time Pitocin was begun at approximately noon on October 28, 2004.  Pitocin was run through the rest of the afternoon until  approximately 7 p.m. at which time it was discontinued due to lack of  cervical change.  The patient never did get very uncomfortable that day.  On  the following evening, Cervidil was again placed.  It was removed on the  following day and Pitocin was begun again.  Pitocin was run the entire day.  There continued to be some sporadic fetal heart rate decelerations but,  overall, the tracing remained reactive.  Late in the evening of October 29, 2004, the patient still had no cervical change.  The options were reviewed  with the patient of discharging her home to await spontaneous labor, attempt  cervical ripening again with one more day of Pitocin, or move to cesarean  section.  After a discussion of the risks and benefits of  each, the patient  and her husband did wish to proceed with cesarean section on the following  morning, but did request being able to remain at the hospital overnight to  get some rest while she was still on the monitor.  The morning of October 30, 2004, the patient was taken to the operating room where a primary low  transverse cesarean section was performed by Dr. Dierdre Forth under  spinal anesthesia.  The findings were a viable female by the name of Sharlet Salina,  weight 7 pounds 8 ounces with Apgars 8 and 9.  The patient tolerated the  procedure well and was taken to the recovery room in good condition.  The  rest of the patient's hospital course was uncomplicated.  By postop day one,  she was doing well.  Her hemoglobin was 9.4 down from 11, white blood cell  count 12.2, and platelet count 199.  Her vital signs remained stable.  She  remained afebrile.  She was breast feeding which was going well.  She was  using oral medication for pain.  Her incision was clean,  dry, and intact  with subcuticular sutures noted.  By the morning of November 02, 2004, the  patient was up ad lib, tolerating a regular diet, and doing well.  The  infant was deemed ready to go home despite a small amount of jaundice,  therefore, the patient was also deemed to have received full benefit of her  hospital stay and was discharged home.   DISCHARGE INSTRUCTIONS:  The patient was given [facility] handout.  Discharge follow up in six weeks at [facility].   CONDITION ON DISCHARGE:  Stable.   DISCHARGE MEDICATIONS:  Motrin 600 mg p.o. q.6h. p.r.n. pain, Tylox 1-2 p.o.  q.3-4h. p.r.n. pain.      VLL/MEDQ  D:  11/02/2004  T:  11/02/2004  Job:  [id number]
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
## 2010-12-09 NOTE — Discharge Summary (Signed)
NAMECAMMI, Jefferson             ACCOUNT NO.:  [id number]   MEDICAL RECORD NO.:  [id number]          PATIENT TYPE:  OBV   LOCATION:  9157                          [facility]   PHYSICIAN:  Cam Hai, C.N.M.  DATE OF BIRTH:  1975-09-04   DATE OF ADMISSION:  09/23/2004  DATE OF DISCHARGE:  09/24/2004                                 DISCHARGE SUMMARY   ADMISSION DIAGNOSES:  1.  Intrauterine pregnancy at 31-6/7 weeks.  2.  Fetal heart rate changes.   DISCHARGE DIAGNOSES:  1.  Intrauterine pregnancy at 31-6/7 weeks.  2.  Fetal heart rate changes.   HISTORY OF PRESENT ILLNESS:  [name] is a 35 year old primigravida at 24-  6/7 weeks who was admitted from the office with a several-day history of  decreased and different fetal movement.  She had a nonreactive NST in the  office.  She was sent over to [facility] for nonstress test and  biophysical profile.  Her pregnancy has been followed by [facility]  service and noted to be remarkable for:  1) Rh negative.  2) History of  infertility.  3) History of asthma.  4) Elevated one-hour Glucola with a  normal three-hour GTT and glycosuria in the office on September 23, 2004.  When  she was admitted, she had a nonreactive NST with baseline 115 to 125 with  frequent mild variables.  She had a BPP of 8 out of 8 with normal fluid, AFI  of 14.6 cm, and an anterior placenta.  She was admitted for overnight  monitoring and repeat BPP and ultrasound in the morning.  She also had a  fasting blood sugar and PIH labs drawn this morning due to some epigastric  pain.  Her fasting blood sugar this morning was 107, but she had just  previously had apple juice to drink.  Her PIH labs were within normal  limits.  Estimated fetal weight on her ultrasound was in the 90th to 95th  percentile with normal fluid.  BPP this morning was also 8 out of 8 and  fetal heart rate baseline was in the 120's.  The patient was anxious to be  discharged  home and was deemed to have received the full benefit of her  hospital stay.   DISCHARGE INSTRUCTIONS:  Preterm labor precautions as well as fetal kick  counts were reviewed.   FOLLOW UP:  Discharge follow-up will occur at [facility] on Monday,  March 6, for a fasting blood sugar as well as a nonstress test.   DISCHARGE MEDICATIONS:  Prenatal vitamins one p.o. daily.      KS/MEDQ  D:  09/24/2004  T:  09/26/2004  Job:  [id number]

## 2010-12-09 NOTE — Op Note (Signed)
Anne Jefferson, Anne Jefferson             ACCOUNT NO.:  [id number]   MEDICAL RECORD NO.:  [id number]           PATIENT TYPE:   LOCATION:                                 FACILITY:   PHYSICIAN:  Hal Morales, M.D.DATE OF BIRTH:  Aug 30, 1975   DATE OF PROCEDURE:  10/30/2004  DATE OF DISCHARGE:                                 OPERATIVE REPORT   PREOPERATIVE DIAGNOSES:  1.  Intrauterine pregnancy at 37 weeks.  2.  Nonreassuring fetal heart rate changes since to 32 weeks.  3.  Failed induction.   POSTOPERATIVE DIAGNOSES:  1.  Intrauterine pregnancy at 37 weeks.  2.  Nonreassuring fetal heart rate changes since to 32 weeks.  3.  Failed induction.   OPERATION:  Primary low transverse cesarean section.   SURGEON:  Hal Morales, M.D.   FIRST ASSISTANT:  Rica Koyanagi, Certified Nurse Midwife.   ANESTHESIA:  Spinal.   ESTIMATED BLOOD LOSS:  Less than 750 mL.   COMPLICATIONS:  None.   FINDINGS:  The patient was delivered of a female infant whose name is Anne Jefferson  weighing 7 pounds 8 ounces with Apgars 8 and 9 and one and five minutes  respectively. The uterus, tubes and ovaries were normal for the gravid  state. There were large sinus vessels near the uterovesical junction.   PROCEDURE IN DETAIL:  The patient was taken to the operating room after  appropriate identification and placed on the operating table. After the  placement of spinal anesthetic she was placed in the supine position with a  left lateral tilt. The abdomen was prepped with multiple layers of Betadine  and draped as a sterile field.   DICTATION ENDED HERE.     VPH/MEDQ  D:  10/30/2004  T:  10/30/2004  Job:  [id number]
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
## 2010-12-09 NOTE — Op Note (Signed)
NAMERaylene Jefferson              ACCOUNT NO.:  [id number]   MEDICAL RECORD NO.:  [id number]           PATIENT TYPE:   LOCATION:                                 FACILITY:   PHYSICIAN:  Hal Morales, M.D.     DATE OF BIRTH:   DATE OF PROCEDURE:  10/30/2004  DATE OF DISCHARGE:                                 OPERATIVE REPORT   PREOPERATIVE DIAGNOSES:  1.  Intrauterine pregnancy at [redacted] weeks gestation.  2.  Nonreassuring fetal heart rate changes.  3.  Failed induction.   POSTOPERATIVE DIAGNOSES:  1.  Intrauterine pregnancy at [redacted] weeks gestation.  2.  Nonreassuring fetal heart rate changes.  3.  Failed induction.   OPERATION:  Primary low transverse cesarean section.   SURGEON:  Vanessa P. Pennie Rushing, M.D.   FIRST ASSISTANT:  Rica Koyanagi, certified nurse midwife   ANESTHESIA:  Spinal.   ESTIMATED BLOOD LOSS:  Less than 750 mL.   COMPLICATIONS:  None.   FINDINGS:  The patient was delivered of a female infant whose name is Sharlet Salina  weighing 7 pounds 8 ounces with Apgars of 8 and 9 at one and five minutes  respectively. The uterus, tubes and ovaries were normal for the gravid  state. The uterus did contain large varicosities near the uterovesical  junction.   PROCEDURE:  The patient was taken to the operating room after appropriate  identification and placed on the operating table. After placement of a  spinal anesthetic she was placed in the supine position with a left lateral  tilt. The abdomen and perineum were prepped with multiple layers of  Betadine. A Foley catheter was inserted into the bladder and connected to  straight drainage. The abdomen was draped as a sterile field. After  assurance of adequate anesthesia in the suprapubic region this area was  infiltrated with 20 mL of 0.25% Marcaine. A suprapubic incision was made and  the abdomen opened in layers. The peritoneum was entered and the bladder  blade placed. The uterus was incised approximately 2 cm  above the  uterovesical fold and that incision taken laterally on either side bluntly.  The infant was delivered from the occiput transverse position and after  having the nares and pharynx suctioned and the cord clamped and cut was  handed off to the awaiting pediatricians. The appropriate cord blood was  drawn and placenta noted to have separated from the uterus and was removed  from the operative field. The uterine incision was closed with a running  interlocking suture of zero Vicryl. An imbricating suture of zero Vicryl was  then placed for adequate hemostasis. Copious irrigation was carried out. The  abdominal peritoneum was closed with a running suture of 2-0 Vicryl. The  rectus muscles were reapproximated in midline with figure-of-eight suture of  2-0 Vicryl. The rectus fascia was closed with a running suture of zero  Vicryl and reinforced on either side of midline with figure-of-eight sutures  of zero Vicryl. Subcutaneous tissue was copiously irrigated and made  hemostatic with Bovie cautery. A subcuticular suture of 3-0 Monocryl was  used  to close the skin incision. A sterile dressing was applied and the  patient taken from the operating room to the recovery room in satisfactory  condition having tolerated the procedure well with sponge and instrument  counts correct. The infant went to the full-term nursery. The placenta went  to pathology.      VPH/MEDQ  D:  10/30/2004  T:  10/30/2004  Job:  [id number]

## 2011-10-18 ENCOUNTER — Encounter (INDEPENDENT_AMBULATORY_CARE_PROVIDER_SITE_OTHER): Payer: PRIVATE HEALTH INSURANCE | Admitting: Obstetrics and Gynecology

## 2011-10-18 DIAGNOSIS — N912 Amenorrhea, unspecified: Secondary | ICD-10-CM

## 2011-10-18 DIAGNOSIS — Z0142 Encounter for cervical smear to confirm findings of recent normal smear following initial abnormal smear: Secondary | ICD-10-CM

## 2012-02-08 ENCOUNTER — Telehealth: Payer: Self-pay | Admitting: Obstetrics and Gynecology

## 2012-02-08 NOTE — Telephone Encounter (Signed)
Chandra/vph pt °

## 2012-02-08 NOTE — Telephone Encounter (Signed)
Tc to pt per telephone call. Pt wants to speak with vph via phone. Pt opts not to sched an appt at this time due to pt's home which is long distance. Pt has spoken with vph about infertility in 09/2011 and wants more information rgdg possible needs for cerclage if she conceives. Pt opts to try to conceive, preferably  starting next month, but wants to pre plan based of vph recs. Will make vph aware.  Pt voices understanding.

## 2012-02-09 ENCOUNTER — Telehealth: Payer: Self-pay

## 2012-02-09 NOTE — Telephone Encounter (Signed)
Tc to pt per vph's recs rgdg need for cerclage. Informed pt that Dr.Haygood is unable to determine need for cerclage until after pregnancy occurs. Pt told at approx 13 weeks of pregnancy, an ultrasound will be done to measure cervical length. At that time, need for cerclage can be determined. Pt voices understanding. Information rgdg Donor Insemination mailed to pt. Pt agrees.

## 2012-04-03 ENCOUNTER — Telehealth: Payer: Self-pay

## 2012-04-03 NOTE — Telephone Encounter (Signed)
Tc from pt with questions regarding Donor Insemination and IUI. Appt prev sched 04/24/12 with vph to discuss;however pt would like to possibly talk with Dr. Pennie Rushing via phone to get process started sooner. Will consult with Dr. Pennie Rushing when returns to office on 03/1812. Pt voices understanding.

## 2012-04-08 ENCOUNTER — Telehealth: Payer: Self-pay | Admitting: Obstetrics and Gynecology

## 2012-04-08 DIAGNOSIS — N979 Female infertility, unspecified: Secondary | ICD-10-CM

## 2012-04-08 DIAGNOSIS — IMO0002 Reserved for concepts with insufficient information to code with codable children: Secondary | ICD-10-CM

## 2012-04-08 NOTE — Telephone Encounter (Signed)
Telephone call returned at patient's request to address several issues related to TDI. 20 mins and 28 secs spent on this call. 1)Pt want to do insemination attempt at home on day of [facility] surge by home monitoring kit, then IUI here the following day.  We discussed the difference between using washed vs unwashed sperm.  She has ordered only washed sperm samples.  She has been instructed to have all sperm samples sent to her home and to keep them in her possession for her use. 2)She understands that I may not be in the office on the day she needs insemination.  She will therefore contact the practice she will use for pregnancy care in [place] to see if IUI can be done there if I am unavailable.  She understands she has the option to see other members of our staff for IUI as well. 2) She will sign a ROI in that office to allow [place] to send records 3) She is due for her aex with followup pap, and currently plans to have that here at [facility].  She understands the importance of this followup especially in light of her hx of an abnormal pap and conization in the past. 4) She will call with any further questions.

## 2012-04-30 ENCOUNTER — Telehealth: Payer: Self-pay | Admitting: Obstetrics and Gynecology

## 2012-04-30 ENCOUNTER — Encounter: Payer: Self-pay | Admitting: Obstetrics and Gynecology

## 2012-04-30 NOTE — Telephone Encounter (Signed)
vph pt 

## 2012-05-01 NOTE — Telephone Encounter (Signed)
Lm on vm to cb per telephone call.  

## 2012-05-01 NOTE — Telephone Encounter (Signed)
Tc from pt. Appt sched 05/08/12@ 10:30a with vph for IUI. Pt agrees.

## 2012-05-02 DIAGNOSIS — IMO0002 Reserved for concepts with insufficient information to code with codable children: Secondary | ICD-10-CM | POA: Insufficient documentation

## 2012-05-02 DIAGNOSIS — R87619 Unspecified abnormal cytological findings in specimens from cervix uteri: Secondary | ICD-10-CM | POA: Insufficient documentation

## 2012-05-02 DIAGNOSIS — Z6791 Unspecified blood type, Rh negative: Secondary | ICD-10-CM | POA: Insufficient documentation

## 2012-05-02 DIAGNOSIS — J4520 Mild intermittent asthma, uncomplicated: Secondary | ICD-10-CM | POA: Insufficient documentation

## 2012-05-02 DIAGNOSIS — N979 Female infertility, unspecified: Secondary | ICD-10-CM | POA: Insufficient documentation

## 2012-05-02 DIAGNOSIS — Z8742 Personal history of other diseases of the female genital tract: Secondary | ICD-10-CM | POA: Insufficient documentation

## 2012-05-03 ENCOUNTER — Telehealth: Payer: Self-pay | Admitting: Obstetrics and Gynecology

## 2012-05-03 NOTE — Telephone Encounter (Signed)
Tc to pt per telephone call. Pt wants to r/s IUI for 05/06/12 due to early ovulation. Pt will do donor insemination. Pt states,"may have to go to hosp on 05/05/12 for IUI". Informed pt to call office after hours if IUI will be needed on 05/05/12 to have set up. Pt agrees. Appt sched 05/06/12@10 :30a with AVS for IUI.

## 2012-05-03 NOTE — Telephone Encounter (Signed)
Pt wants to speak with you about her IUI

## 2012-05-06 ENCOUNTER — Encounter: Payer: PRIVATE HEALTH INSURANCE | Admitting: Obstetrics and Gynecology

## 2012-05-06 ENCOUNTER — Ambulatory Visit: Payer: PRIVATE HEALTH INSURANCE

## 2012-05-06 ENCOUNTER — Telehealth: Payer: Self-pay | Admitting: Obstetrics and Gynecology

## 2012-05-06 NOTE — Telephone Encounter (Signed)
Spoke with pt rgd msg pt states no positive surg today wants to r/s iui until tomorrow pt has appt 05/07/12 at 11:15 with avs

## 2012-05-07 ENCOUNTER — Encounter: Payer: Self-pay | Admitting: Obstetrics and Gynecology

## 2012-05-07 ENCOUNTER — Ambulatory Visit (INDEPENDENT_AMBULATORY_CARE_PROVIDER_SITE_OTHER): Payer: PRIVATE HEALTH INSURANCE | Admitting: Obstetrics and Gynecology

## 2012-05-07 VITALS — BP 122/68 | HR 64 | Temp 98.1°F | Resp 16 | Ht 63.0 in | Wt 161.0 lb

## 2012-05-07 DIAGNOSIS — Z319 Encounter for procreative management, unspecified: Secondary | ICD-10-CM

## 2012-05-07 NOTE — Progress Notes (Signed)
HISTORY OF PRESENT ILLNESS  [name] is a 36 y.o. year old female,G1P1001, who presents for a problem visit. She presents for intrauterine insemination using donor sperm.  The patient has been monitoring her LH surge and her prior menstrual history.  She feels that she will ovulate at any moment.  Subjective:  Doing well.  The patient reports that she has multiple time constraints that require her ( in her opinion) to have her procedure today.  Objective:  BP 122/68  Pulse 64  Temp 98.1 F (36.7 C) (Oral)  Resp 16  Ht 5\' 3"  (1.6 m)  Wt 161 lb (73.029 kg)  BMI 28.52 kg/m2  LMP 04/26/2012   General: mild distress GI: soft and nontender  External genitalia: normal general appearance Vaginal: normal without tenderness, induration or masses and relaxation noted Cervix: normal appearance, cervical mucus is copious, thin, and watery Adnexa: normal bimanual exam Uterus: normal size shape and consistency   INTRAUTERINE INSEMINATION NOTE:  A long discussion was held with the patient and her partner about optimal insemination.  I recommended that a follicle studies be performed.  They considered my recommendation, but felt that they wanted to proceed with insemination at this point based on the information they had, their experienced about her prior ovulations, and their overwhelming desire to proceed immediately.  Further, they want to have a follicle study done tomorrow with a second insemination.  They were told that this is not the traditional procedure.  After considering my opinion, they, once again, insisted on proceeding with a follicle study tomorrow and a second insemination.  An examination was performed.  A speculum exam was performed. The specimen was carefully drawn up into the insemination catheter. The uterus was sounded.  The specimen was then injected toward the fundus of the uterus without difficulty.  All instruments were removed.  The patient's hips were  elevated on a pillow. She will remain supine for 30 min. The patient tolerated her procedure well.  Assessment:  Desire to conceive  Plan:  The patient will return in one day for a follicle study and a second insemination.  Leonard Schwartz M.D.  05/07/2012 9:46 PM

## 2012-05-08 ENCOUNTER — Encounter: Payer: PRIVATE HEALTH INSURANCE | Admitting: Obstetrics and Gynecology

## 2012-05-08 ENCOUNTER — Encounter: Payer: Self-pay | Admitting: Obstetrics and Gynecology

## 2012-05-08 ENCOUNTER — Ambulatory Visit: Payer: PRIVATE HEALTH INSURANCE

## 2012-05-08 ENCOUNTER — Ambulatory Visit (INDEPENDENT_AMBULATORY_CARE_PROVIDER_SITE_OTHER): Payer: PRIVATE HEALTH INSURANCE

## 2012-05-08 ENCOUNTER — Ambulatory Visit (INDEPENDENT_AMBULATORY_CARE_PROVIDER_SITE_OTHER): Payer: PRIVATE HEALTH INSURANCE | Admitting: Obstetrics and Gynecology

## 2012-05-08 ENCOUNTER — Other Ambulatory Visit: Payer: Self-pay | Admitting: Obstetrics and Gynecology

## 2012-05-08 VITALS — BP 100/70 | Wt 162.0 lb

## 2012-05-08 DIAGNOSIS — Z319 Encounter for procreative management, unspecified: Secondary | ICD-10-CM

## 2012-05-08 DIAGNOSIS — N979 Female infertility, unspecified: Secondary | ICD-10-CM

## 2012-05-08 NOTE — Progress Notes (Signed)
INTRA-UTERINE INSEMINATION PROCEDURE   Anne Jefferson is a 36 y.o. female G1P1001 who presents for IUI # 2.  Consent signed:  yes  Indication: same gender couple  Current medication: PNVs Follicle study: Yes 05/08/2012  ULTRASOUND: Uterus: Length: 7.66 cm   Width:  5.04 cm   Height:  4.23 cm  Endo thickness:  1.04 cm   Left ovary: 1 dominant follicle:  >50mm Right ovary:8-10 small <1cm Fibroids:no     CDS fluid:no  Comment: Endometrium-63mm: Day 13. Anteverted uterus- C-sect scar notd. Normal adnexa.   Trigger received: No    Frozen sperm unthawed.  Labeled for IUI   IUI performed per protocol  with Tenaculum  Patient left in dorsal decubitus with pelvis elevated for 20 minutes  Return: prn.  Pt lives in [place], [place].  She will do HPT in 2 -3 weeks if no menses  Dierdre Forth MD 05/08/2012  10:46 AM

## 2012-05-31 ENCOUNTER — Telehealth: Payer: Self-pay | Admitting: Obstetrics and Gynecology

## 2012-05-31 DIAGNOSIS — N979 Female infertility, unspecified: Secondary | ICD-10-CM

## 2012-05-31 NOTE — Addendum Note (Signed)
Addended by: Lerry Liner D on: 05/31/2012 04:32 PM   Modules accepted: Orders

## 2012-05-31 NOTE — Telephone Encounter (Signed)
Tc to pt. LMP-05/23/12. Pt would like to repeat IUI this cycle. Pt req follicle study first. If dominant follicle found, pt will proceed with IUI. OK per vph.

## 2012-05-31 NOTE — Telephone Encounter (Signed)
Routed to CW

## 2012-06-01 ENCOUNTER — Other Ambulatory Visit: Payer: Self-pay | Admitting: Obstetrics and Gynecology

## 2012-06-01 ENCOUNTER — Ambulatory Visit (HOSPITAL_COMMUNITY)
Admission: RE | Admit: 2012-06-01 | Discharge: 2012-06-01 | Disposition: A | Discharge status: Home / Self Care | Payer: Self-pay | Source: Ambulatory Visit | Attending: Obstetrics and Gynecology | Admitting: Obstetrics and Gynecology

## 2012-06-01 ENCOUNTER — Other Ambulatory Visit (HOSPITAL_COMMUNITY): Payer: Self-pay | Admitting: *Deleted

## 2012-06-01 ENCOUNTER — Telehealth: Payer: Self-pay | Admitting: Obstetrics and Gynecology

## 2012-06-01 DIAGNOSIS — N838 Other noninflammatory disorders of ovary, fallopian tube and broad ligament: Secondary | ICD-10-CM | POA: Insufficient documentation

## 2012-06-01 DIAGNOSIS — N979 Female infertility, unspecified: Secondary | ICD-10-CM | POA: Insufficient documentation

## 2012-06-01 DIAGNOSIS — N978 Female infertility of other origin: Secondary | ICD-10-CM

## 2012-06-01 IMAGING — US US FOLLICLE
1 series · 14 of 16 positions shown · non-contrast
Comparison: None.

CLINICAL DATA: Infertility.  LMP [DATE].

TRANSVAGINAL ULTRASOUND OF PELVIS
TECHNIQUE: Transvaginal ultrasound examination of the pelvis was
performed including evaluation of the uterus, ovaries, adnexal
regions, and pelvic cul-de-sac.

[Series 1: us follicle · 14 of 34 slices shown]
[im 1/34]
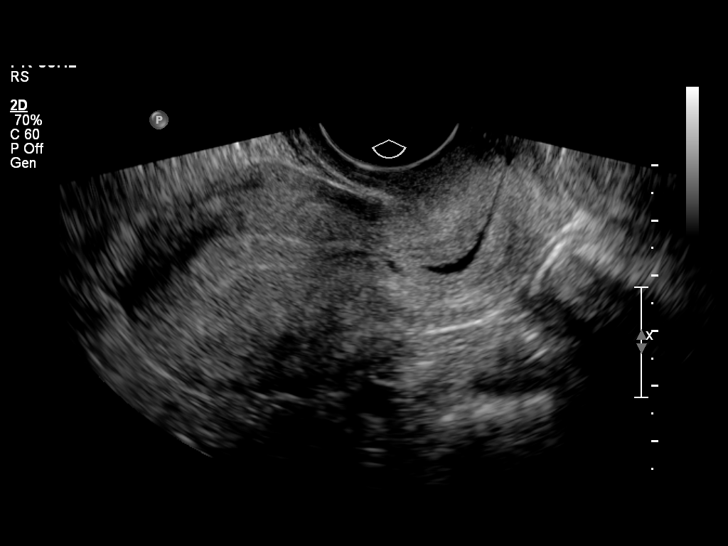
[im 3/34]
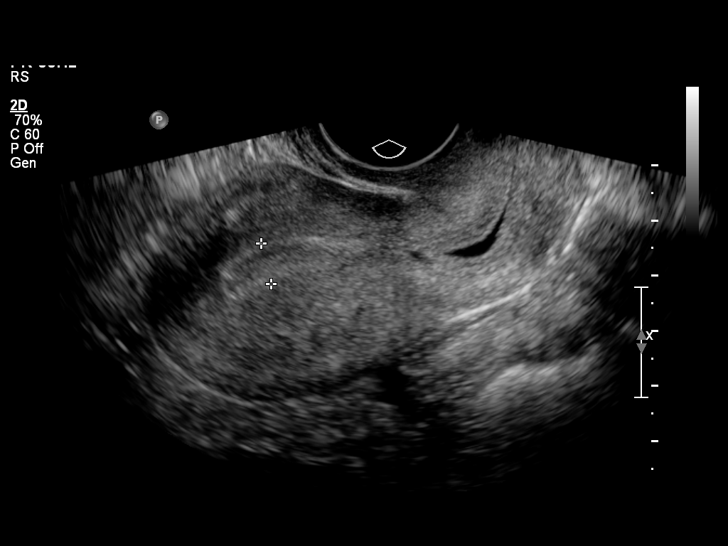
[im 5/34]
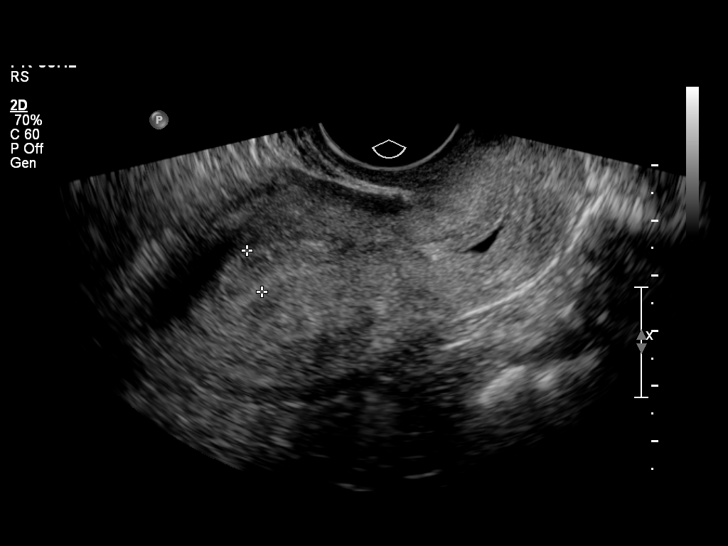
[im 9/34]
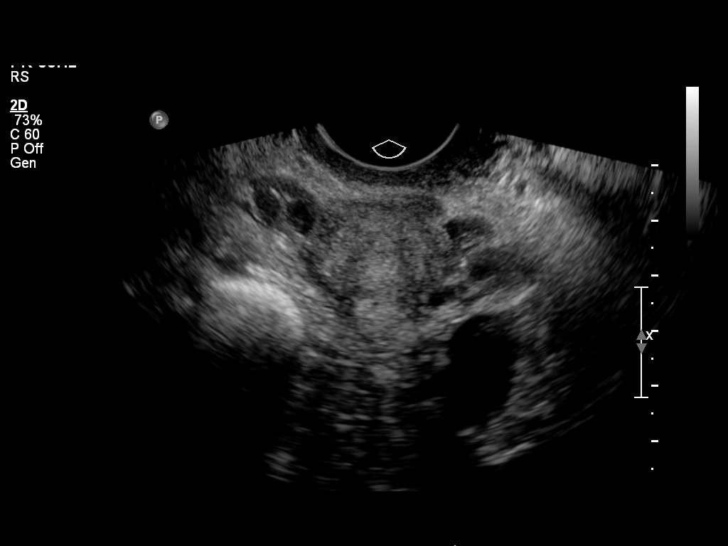
[im 12/34]
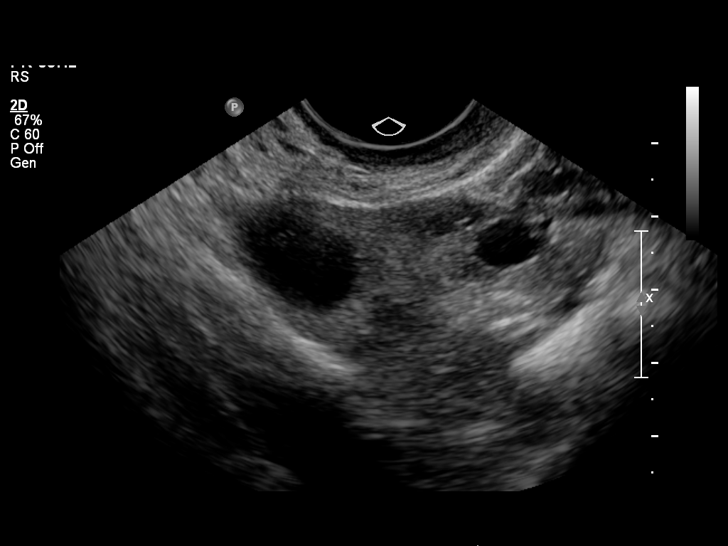
[im 14/34]
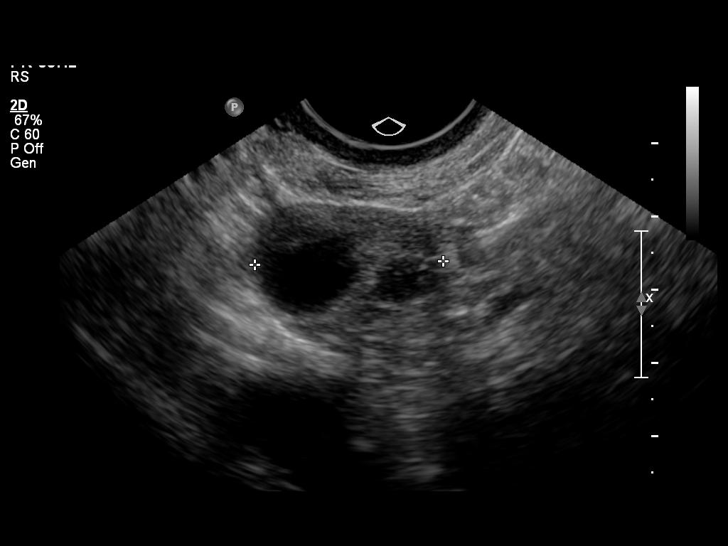
[im 16/34]
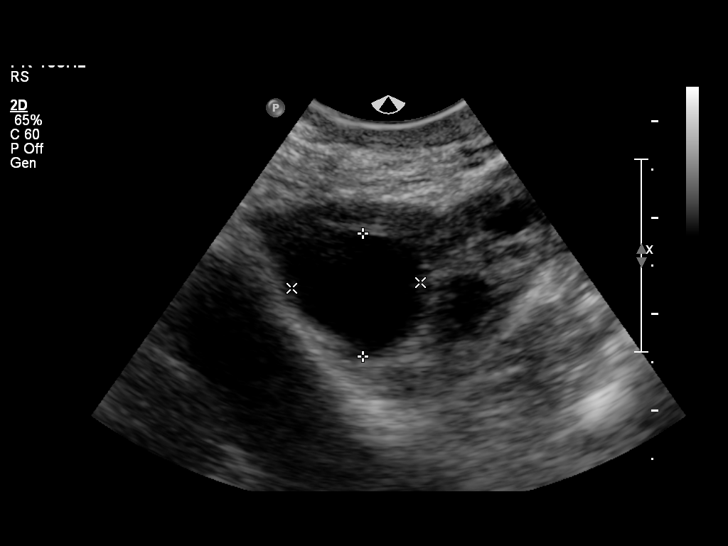
[im 18/34]
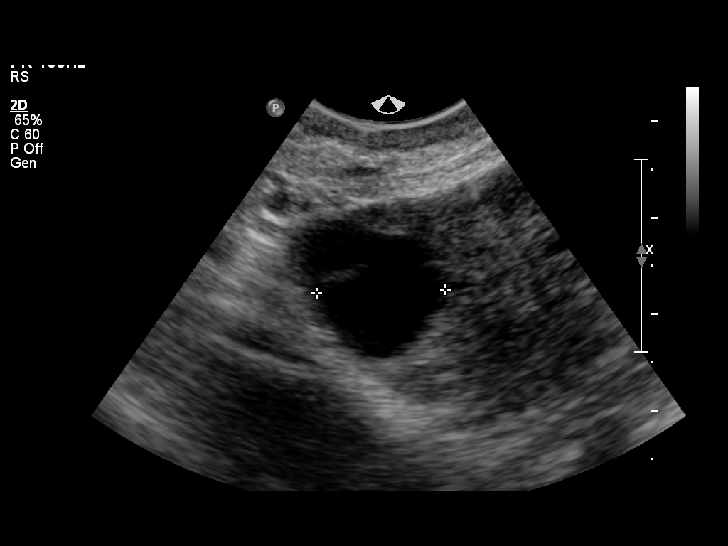
[im 20/34]
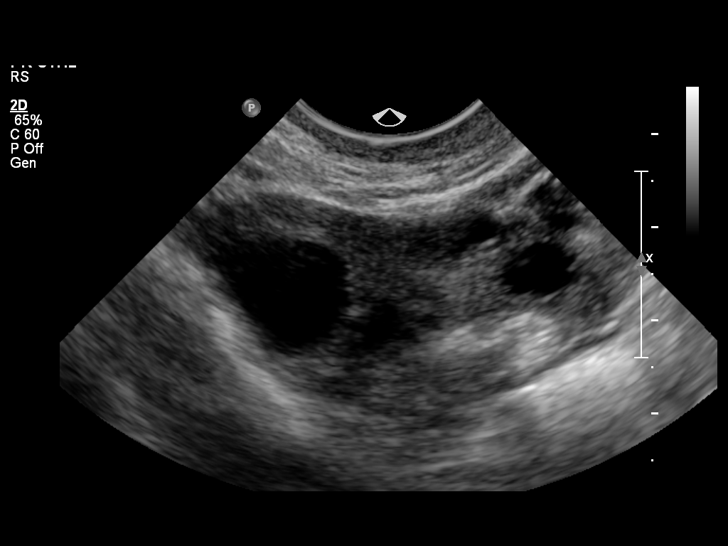
[im 23/34]
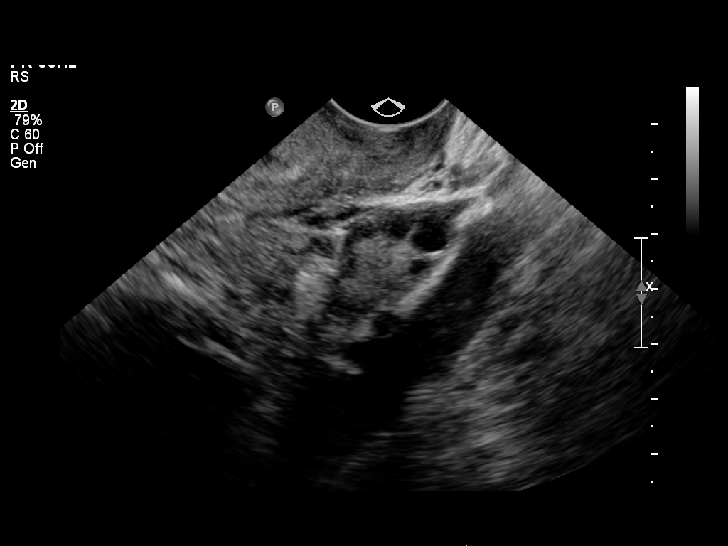
[im 27/34]
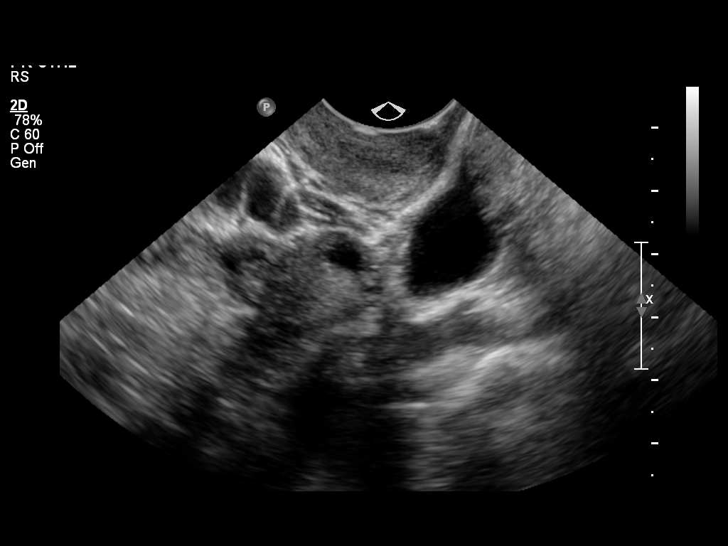
[im 29/34]
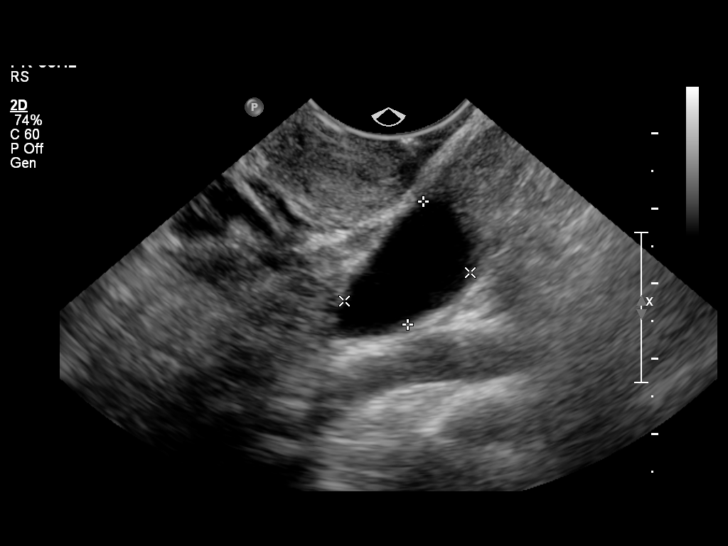
[im 31/34]
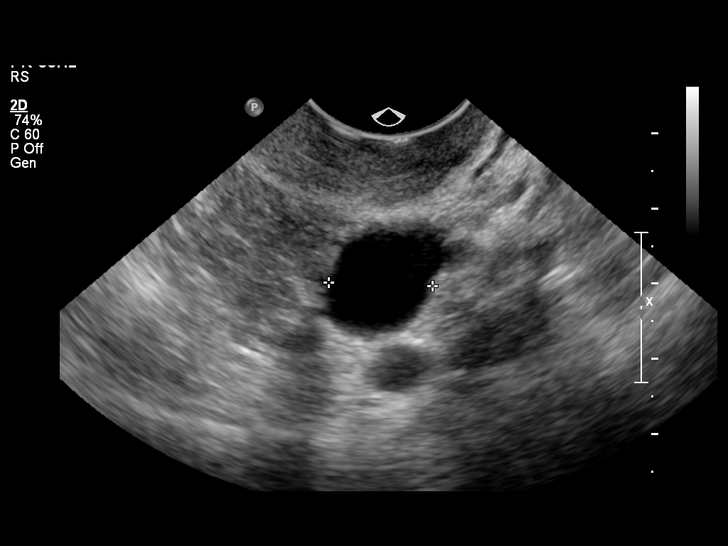
[im 34/34]
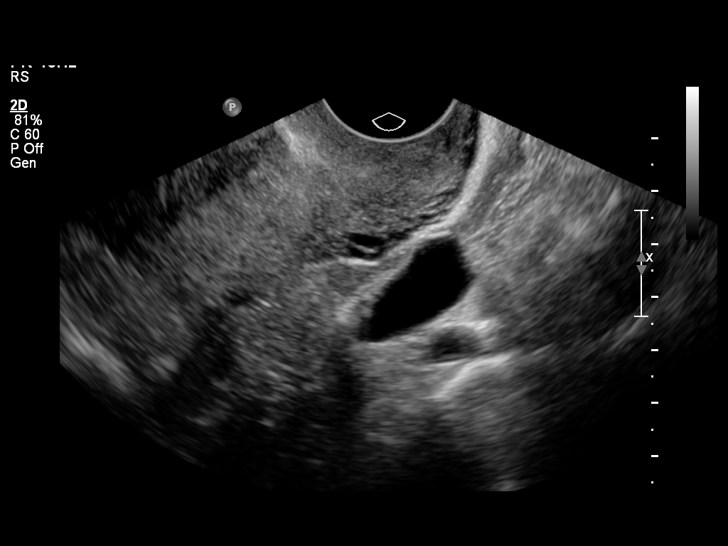

[14 of 16 positions shown; findings below may reference images not displayed]

FINDINGS: Uterus:  Uterus appears normal in size.

Endometrium: Tri-layered

Right ovary: Largest follicle is 12.8 x 13.4 x 13.3 cm.

Left ovary: Follicles are less than 1 cm.

Other Findings:  Left para ovarian cyst versus dilated distal to.
IMPRESSION: 1.  Tri-layered endometrium.
2.  Largest follicle on the right is 13.4 cm.

## 2012-06-01 NOTE — Telephone Encounter (Signed)
I returned a call to the patient who lives in [place] and feels that she has indications of ovulation for timing of her intrauterine insemination. I reviewed with the patient that the charges associated with intrauterine insemination in the hospital were very different than those in the office though I could not quote the actual charge. I gave her the option of not pursuing this procedure in the hospital however she wishes to proceed with ultrasound for follicle measurement and if appropriate intrauterine insemination.

## 2012-06-01 NOTE — Telephone Encounter (Signed)
Will discuss with Dr. Pennie Rushing and call back. Lavera Guise, CNM

## 2012-06-01 NOTE — Progress Notes (Addendum)
Patient ID: Anne Jefferson, female   DOB: 12/03/1975, 36 y.o.   MRN: [id number]  Infertility visit at [facility]  SUBJECTIVE:   The [facility] on day 10 of her menstrual cycle stating that her home ovulation monitor indicates that she will ovulate in 24-hour is. She wants to have intrauterine insemination.  She currently has an appointment in our office on 06/03/2012 for a follicle study and intrauterine insemination however she feels strongly that this date and testing indicates she will ovulate prior to that time and [place] once to be evaluated today.  She and her partner have driven from [facility] for this assessment and treatment.  OBJECTIVE: Ultrasound shows a single dominant follicle on the right ovary measuring 12 mm. There are 9 other developing follicles all less than 10 mm.  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 All questions were answered.  They asked questions for the first time about the possibility of ovulation induction medication and hCG injection to trigger ovulation. We discussed the pros and cons of each of these. We also discussed the difference between line and frozen sperm in terms of longevity in the reproductive tract. 2) the patient will keep her appointment at [facility] on 06/03/2012 for a follicle study and a possible IUI. The couple will decide between now and then whether they want to add a trigger injection for hCG. They will also decide at that time whether they wish  to return on Tuesday for a second intrauterine insemination. 3) they will consider whether in future cycles they want to use ovulation induction agents at all, and whether or they would prefer Clomid or Femara. 4) they decline intrauterine insemination today

## 2012-06-03 ENCOUNTER — Other Ambulatory Visit: Payer: PRIVATE HEALTH INSURANCE

## 2012-06-03 ENCOUNTER — Telehealth: Payer: Self-pay

## 2012-06-03 ENCOUNTER — Encounter (INDEPENDENT_AMBULATORY_CARE_PROVIDER_SITE_OTHER): Payer: PRIVATE HEALTH INSURANCE

## 2012-06-03 ENCOUNTER — Encounter: Payer: Self-pay | Admitting: Obstetrics and Gynecology

## 2012-06-03 ENCOUNTER — Ambulatory Visit (INDEPENDENT_AMBULATORY_CARE_PROVIDER_SITE_OTHER): Payer: PRIVATE HEALTH INSURANCE | Admitting: Obstetrics and Gynecology

## 2012-06-03 VITALS — BP 92/58 | Ht 63.0 in | Wt 163.0 lb

## 2012-06-03 DIAGNOSIS — N979 Female infertility, unspecified: Secondary | ICD-10-CM

## 2012-06-03 DIAGNOSIS — Z3169 Encounter for other general counseling and advice on procreation: Secondary | ICD-10-CM

## 2012-06-03 NOTE — Telephone Encounter (Signed)
TC to [facility] in [facility] [place] Spoke to Dr.Weingarwho stated pt could bring A.I Tank to be charged free of charge. Pt tank will be OOD on Wed 06-05-12. Pt and spouse made aware Pt agreeable   LC CMA

## 2012-06-03 NOTE — Progress Notes (Signed)
HISTORY OF PRESENT ILLNESS  [name] is a 36 y.o. year old female,G1P1001, who presents for a problem visit. The patient is interested and intrauterine insemination with donor sperm.  The container for her semen sampled these to be be discharged in 2 days. Day 12 of her cycle.  2 days ago her follicle measured 1.3 cm.  Subjective:  Somewhat frustrated.  Objective:  BP 92/58  Ht 5\' 3"  (1.6 m)  Wt 163 lb (73.936 kg)  BMI 28.87 kg/m2  LMP 05/23/2012   General: mild distress  Exam deferred.  Ultrasound: Uterus measures 7.7 x 5.5 cm.  Endometrium measures 0.93 cm.  Both ovaries appear to be normal.  There is a follicle on the right that measures 1.61 cm.  Assessment:  Desires pregnancy using anterior uterine insemination of donor sperm.  Follicle not ready for trigger injection.  Plan:  Options for repeat follicle studies were reviewed.  I recommended that we consider a repeat follicle study in 3 days with the hopes of saving money.  The patient is more comfortable repeating her follicle study in 2 days.  The patient will return in 2 days for repeat ultrasound.  Arrangements have been made for the IUI  container with the semen sample to be be recharged.  Multiple questions answered.  Today's encounter was 25 min. with 50% being face-to-face with the patient.  Return to office in 2 day(s).   Leonard Schwartz M.D.  06/03/2012 7:52 PM    Last Pap: 10/18/2011  Trying to Conceive: yes G,P: 1,1 TSH:  FSH/LH PRL: DAY 21 PROG: HSG:  Semen Analysis:  LSC:  FBS/INSULIN: GC/CHLAMYDIA:

## 2012-06-04 ENCOUNTER — Telehealth: Payer: Self-pay

## 2012-06-04 ENCOUNTER — Other Ambulatory Visit: Payer: Self-pay

## 2012-06-04 DIAGNOSIS — N979 Female infertility, unspecified: Secondary | ICD-10-CM

## 2012-06-04 NOTE — Telephone Encounter (Signed)
Pt WAS CALLED AT 1:15pm to schedule U/S Follicle STUDY. Pt was NOT AVA. DETAILED MESSAGE LEFT FOR PT TO CALL THE OFFICE BACK. ORDER FOR U/S IS PLACED PT JUST NEEDS TO BE SCHEDULED. I ALSO LEFT A MESSAGE THAT I AM ON THE FLOOR AND SHE MAY NOT BE ABLE TO GET ME. I LEFT MESSAGE THAT A DETAILED MESSAGE WILL BE SENT TO MANAGEMMENT FOR HER U/S TO BE SCHEDULED SINCE I AM ON THE FLOOR  PER AVS pt IS TO HAVE U/S on WED 06-05-12 PER AVS HE IS ON CALL ON WED 06-05-12. BUT IF HE CAN COME IN THE OFFICE AFTER THE PT U/S IS DONE HE WILL DR.STRINGER SAID TO CALL HIM AS SOON AS U/S IS COMPLETE BUT SINCE HE IS "THE ON CALL DR. HE MAY BE TOO BUSY TO COME IN. SO  THAT IS NOT A DEFINITE "YES" THAT HE CAN COME IN. PER AVS "IF HE CAN COME IN HE WILL; AND IF HE CANT HE CANT"   LC CMA

## 2012-06-05 ENCOUNTER — Ambulatory Visit (INDEPENDENT_AMBULATORY_CARE_PROVIDER_SITE_OTHER): Payer: PRIVATE HEALTH INSURANCE

## 2012-06-05 ENCOUNTER — Encounter: Payer: Self-pay | Admitting: Obstetrics and Gynecology

## 2012-06-05 ENCOUNTER — Other Ambulatory Visit: Payer: PRIVATE HEALTH INSURANCE

## 2012-06-05 ENCOUNTER — Ambulatory Visit (INDEPENDENT_AMBULATORY_CARE_PROVIDER_SITE_OTHER): Payer: PRIVATE HEALTH INSURANCE | Admitting: Obstetrics and Gynecology

## 2012-06-05 VITALS — BP 130/82

## 2012-06-05 DIAGNOSIS — N979 Female infertility, unspecified: Secondary | ICD-10-CM

## 2012-06-05 DIAGNOSIS — Z319 Encounter for procreative management, unspecified: Secondary | ICD-10-CM

## 2012-06-05 NOTE — Progress Notes (Signed)
HISTORY OF PRESENT ILLNESS  Anne Jefferson is a 36 y.o. year old female,G1P1001, who presents for a problem visit. The patient desires conception.  She plans intrauterine insemination using donor sperm.  On June 03, 2012 her dominant follicle measured 1.6 cm.  On June 01, 2012 her follicle measured 1.3 cm.  Subjective:  Sad about her test results.  Objective:  BP 130/82  LMP 05/23/2012   General: very sad  Exam deferred.  Ultrasound: A dominant follicle measures 1.3 cm today.  Endometrium measures 9.5 mm.  Uterus measures 6.6 x 4.9 cm.  Ovaries appear normal.  Assessment:  Collapsing follicle.  Plan:  Management options reviewed.  We discussed monitoring during her next cycle, Clomid stimulation, FSH stimulation, and in vitro fertilization.  The risks and benefits of those options were reviewed. The patient will discuss management plans with Dr. Pennie Rushing.  Leonard Schwartz M.D.  06/05/2012 5:04 PM

## 2012-06-06 ENCOUNTER — Other Ambulatory Visit: Payer: PRIVATE HEALTH INSURANCE

## 2012-06-07 ENCOUNTER — Other Ambulatory Visit: Payer: PRIVATE HEALTH INSURANCE

## 2012-06-07 ENCOUNTER — Telehealth: Payer: Self-pay

## 2012-06-07 MED ORDER — LETROZOLE 2.5 MG PO TABS: ORAL_TABLET | ORAL | Status: DC

## 2012-06-07 NOTE — Telephone Encounter (Signed)
Tc to pt after consult with VPH. Pt made aware that Femara may not work if day #3 labs are at all abnormal(TSH,FSH,Prolactin). Pt may wait til next month to start Femara or may start Femara on same day labs are to be drawn. Pt voices understanding and opts to proceed with Femara on day #3 of cycle and have labs drawn that day also. Pt to call 1st day of menses to sched follicle study(day 12 or 13) and day 21 progesterone per VPH recs. Pt agrees.

## 2012-06-07 NOTE — Telephone Encounter (Signed)
Tc to pt rgdg decision about fertility plan for next cycle. Pt opts to proceed with Femara next cycle and have another IUI performed. If no conception with IUI next cycle, pt opts to proceed with IVF. Pt likely due to start cycle around 06/17/12. Femara 2.5mg (directions explained to pt in detail) e-pres to pharm on file. Order for day #3 FSH,TSH and Prolactin mailed to pt's home. Pt will call with positive LH surge to sched IUI. Pt sched her own consult in [place] rgdg IVF on 06/27/12. Pt voices understanding.

## 2012-06-09 NOTE — Telephone Encounter (Signed)
Per pt's request, will start with Femara 2.5 mg daily on cylce days 3-7 (#5 with no refill) for ovulation induction/enhancement.  She will also have TSH, FSH AND Prolactin drawn on the same day at a lab in her vicinity (in [place], [place]).

## 2012-06-14 ENCOUNTER — Telehealth: Payer: Self-pay | Admitting: Obstetrics and Gynecology

## 2012-06-14 NOTE — Telephone Encounter (Signed)
Tc to pt per telephone call. Informed pt whether she take the Femara on day#3 before or after labs is okay. Pt voices understanding.

## 2012-06-24 ENCOUNTER — Telehealth: Payer: Self-pay | Admitting: Obstetrics and Gynecology

## 2012-06-24 NOTE — Telephone Encounter (Signed)
Tc from pt. LMP-06/18/12. Needs follicle study and day 21 progesterone. Order faxed to pt for day 12 follicle study on 06/29/12 to [facility] and a copy of order also mailed to pt. Order for day 21 progesterone mailed to pt to have drawn on 07/08/12. Informed pt will cb with results of day 3 labs after review by [facility]. Pt agrees.

## 2012-06-25 ENCOUNTER — Telehealth: Payer: Self-pay

## 2012-06-25 NOTE — Telephone Encounter (Signed)
Tc to pt per test results. Informed pt TSH, Prolactin,FSH=not menopausal. Pt voices understanding.

## 2012-07-01 ENCOUNTER — Telehealth: Payer: Self-pay | Admitting: Obstetrics and Gynecology

## 2014-05-25 ENCOUNTER — Encounter: Payer: Self-pay | Admitting: Obstetrics and Gynecology

## 2017-08-01 DIAGNOSIS — M9908 Segmental and somatic dysfunction of rib cage: Secondary | ICD-10-CM | POA: Diagnosis not present

## 2017-08-01 DIAGNOSIS — M9902 Segmental and somatic dysfunction of thoracic region: Secondary | ICD-10-CM | POA: Diagnosis not present

## 2017-08-01 DIAGNOSIS — M791 Myalgia, unspecified site: Secondary | ICD-10-CM | POA: Diagnosis not present

## 2017-08-02 DIAGNOSIS — M9908 Segmental and somatic dysfunction of rib cage: Secondary | ICD-10-CM | POA: Diagnosis not present

## 2017-08-02 DIAGNOSIS — M9902 Segmental and somatic dysfunction of thoracic region: Secondary | ICD-10-CM | POA: Diagnosis not present

## 2017-08-02 DIAGNOSIS — M791 Myalgia, unspecified site: Secondary | ICD-10-CM | POA: Diagnosis not present

## 2017-08-03 ENCOUNTER — Ambulatory Visit
Admission: RE | Admit: 2017-08-03 | Discharge: 2017-08-03 | Disposition: A | Discharge status: Home / Self Care | Payer: Self-pay | Source: Ambulatory Visit | Attending: Internal Medicine | Admitting: Internal Medicine

## 2017-08-03 ENCOUNTER — Other Ambulatory Visit: Payer: Self-pay | Admitting: Internal Medicine

## 2017-08-03 DIAGNOSIS — M549 Dorsalgia, unspecified: Secondary | ICD-10-CM | POA: Diagnosis not present

## 2017-08-03 DIAGNOSIS — M62830 Muscle spasm of back: Secondary | ICD-10-CM

## 2017-08-03 IMAGING — DX DG THORACIC SPINE 3V
3 series · 3 of 3 positions shown · non-contrast
Comparison: None.

CLINICAL DATA: Upper back pain for 1 week, no known injury, initial
encounter

EXAM:
THORACIC SPINE - 3 VIEWS

[dg thoracic spine w/swimmers (1 of 3)]
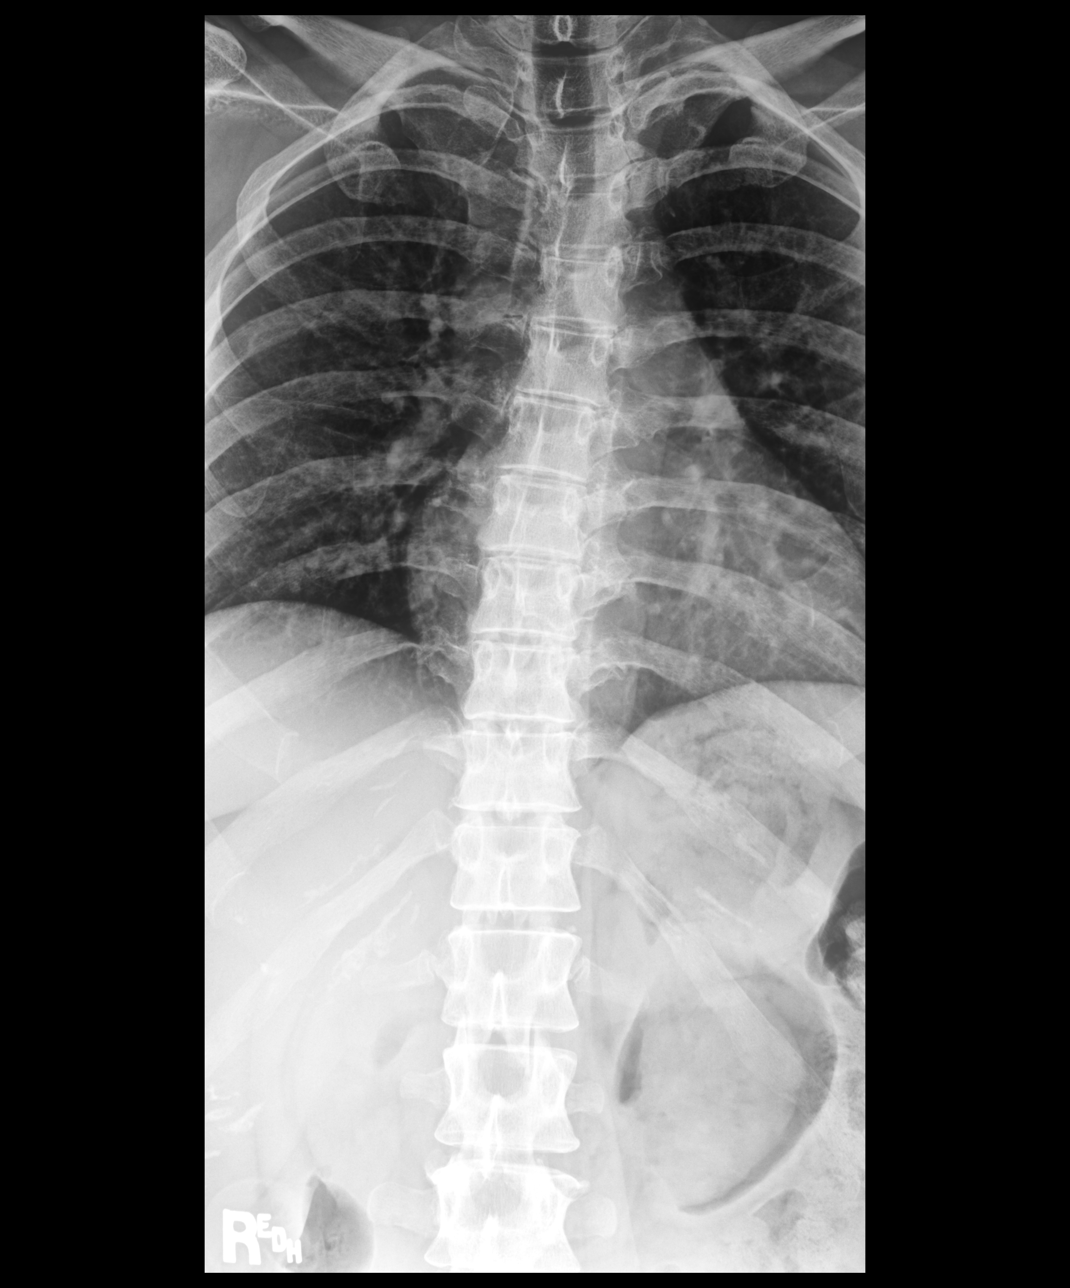

[dg thoracic spine w/swimmers (2 of 3)]
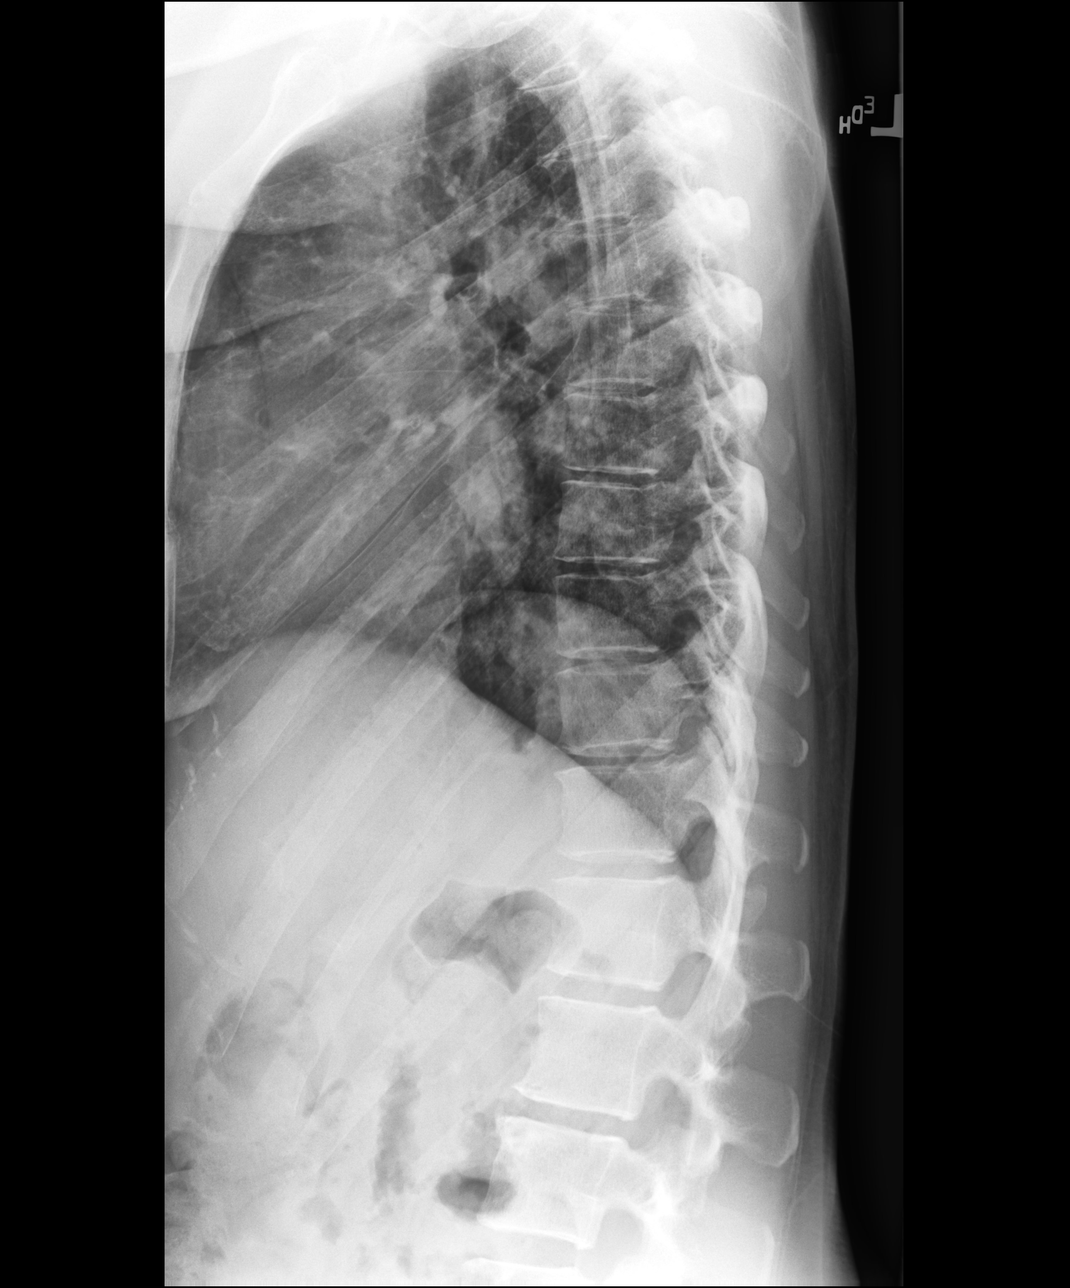

[dg thoracic spine w/swimmers (3 of 3)]
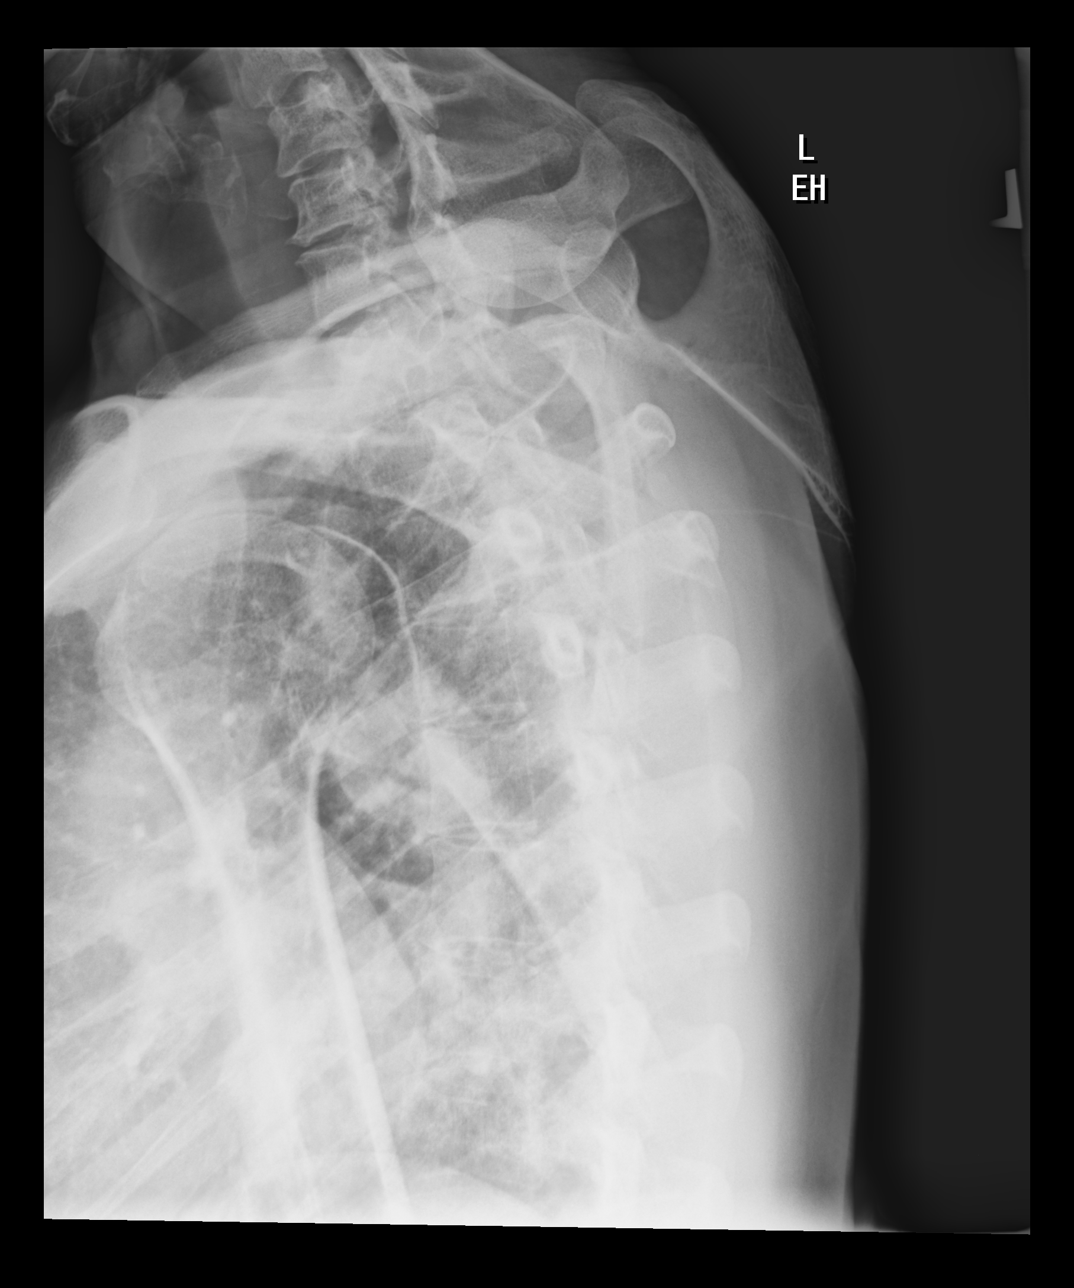

[3 of 3 positions shown; findings below may reference images not displayed]

FINDINGS: Very mild S-shaped scoliosis of the thoracic spine is noted concave
to the right in the upper thoracic spine and mildly concave to the
left in the lower thoracic spine. Vertebral body height is well
maintained. No paraspinal mass lesion is seen. No pedicle
abnormality is noted.
IMPRESSION: No acute abnormality identified.  Very mild scoliosis is seen.

## 2017-08-18 DIAGNOSIS — H00025 Hordeolum internum left lower eyelid: Secondary | ICD-10-CM | POA: Diagnosis not present

## 2017-10-02 DIAGNOSIS — M940 Chondrocostal junction syndrome [Tietze]: Secondary | ICD-10-CM | POA: Diagnosis not present

## 2017-10-02 DIAGNOSIS — M549 Dorsalgia, unspecified: Secondary | ICD-10-CM | POA: Diagnosis not present

## 2017-10-02 DIAGNOSIS — M5489 Other dorsalgia: Secondary | ICD-10-CM | POA: Diagnosis not present

## 2017-10-02 DIAGNOSIS — Z6827 Body mass index (BMI) 27.0-27.9, adult: Secondary | ICD-10-CM | POA: Diagnosis not present

## 2017-11-19 ENCOUNTER — Other Ambulatory Visit (HOSPITAL_COMMUNITY): Payer: Self-pay | Admitting: Internal Medicine

## 2017-11-19 DIAGNOSIS — R1011 Right upper quadrant pain: Secondary | ICD-10-CM | POA: Diagnosis not present

## 2017-11-19 DIAGNOSIS — K829 Disease of gallbladder, unspecified: Secondary | ICD-10-CM

## 2017-11-20 ENCOUNTER — Ambulatory Visit (HOSPITAL_COMMUNITY)
Admission: RE | Admit: 2017-11-20 | Discharge: 2017-11-20 | Disposition: A | Discharge status: Home / Self Care | Payer: 59 | Source: Ambulatory Visit | Attending: Internal Medicine | Admitting: Internal Medicine

## 2017-11-20 ENCOUNTER — Ambulatory Visit: Payer: Self-pay | Admitting: General Surgery

## 2017-11-20 DIAGNOSIS — K8020 Calculus of gallbladder without cholecystitis without obstruction: Secondary | ICD-10-CM | POA: Diagnosis not present

## 2017-11-20 DIAGNOSIS — K769 Liver disease, unspecified: Secondary | ICD-10-CM | POA: Diagnosis not present

## 2017-11-20 DIAGNOSIS — K828 Other specified diseases of gallbladder: Secondary | ICD-10-CM | POA: Diagnosis not present

## 2017-11-20 DIAGNOSIS — K8010 Calculus of gallbladder with chronic cholecystitis without obstruction: Secondary | ICD-10-CM | POA: Diagnosis not present

## 2017-11-20 DIAGNOSIS — K829 Disease of gallbladder, unspecified: Secondary | ICD-10-CM

## 2017-11-20 NOTE — H&P (View-Only) (Signed)
History of Present Illness Anne Spruce MD; 11/20/2017 3:29 PM) The patient is a 42 year old female who presents with non-malignant abdominal pain. The patient has a multiple month history of lower chest pain and epigastric pain. Pain is intermittent in nature. It is not related to food. He has been treated with NSAIDs for costochondritis without relief. He is getting more intense and she is missing work. She also notices nausea and vomiting when the pain is most intense. She denies diarrhea. She does have constipation for the last few months.   Past Surgical History (Anne Jefferson, [place]; 11/20/2017 2:23 PM) Cesarean Section - Multiple  Foot Surgery  Left. Oral Surgery   Diagnostic Studies History (Anne Jefferson, [place]; 11/20/2017 2:23 PM) Colonoscopy  never Mammogram  within last year Pap Smear  1-5 years ago  Allergies (Anne Jefferson, [place]; 11/20/2017 2:25 PM) No Known Drug Allergies [11/20/2017]: Allergies Reconciled   Medication History (Anne Jefferson, RMA; 11/20/2017 2:25 PM) OxyCODONE HCl (10MG  Tablet, Oral) Active. Medications Reconciled  Social History (Anne Jefferson, [place]; 11/20/2017 2:23 PM) Alcohol use  Occasional alcohol use. No caffeine use  No drug use  Tobacco use  Never smoker.  Family History (Anne Jefferson, [place]; 11/20/2017 2:23 PM) Arthritis  Mother.  Pregnancy / Birth History (Anne Jefferson, [place]; 11/20/2017 2:23 PM) Age at menarche  1 years. Contraceptive History  Oral contraceptives. Gravida  2 Length (months) of breastfeeding  >24 Maternal age  64-35 Para  3 Regular periods   Other Problems (Anne Jefferson, [place]; 11/20/2017 2:23 PM) Asthma  Chest pain     Review of Systems (Anne Jefferson RMA; 11/20/2017 2:23 PM) General Present- Fatigue and Night Sweats. Not Present- Appetite Loss, Chills, Fever, Weight Gain and Weight Loss. Skin Present- Rash. Not Present- Change in Wart/Mole, Dryness, Hives,  Jaundice, New Lesions, Non-Healing Wounds and Ulcer. HEENT Not Present- Earache, Hearing Loss, Hoarseness, Nose Bleed, Oral Ulcers, Ringing in the Ears, Seasonal Allergies, Sinus Pain, Sore Throat, Visual Disturbances, Wears glasses/contact lenses and Yellow Eyes. Respiratory Present- Difficulty Breathing. Not Present- Bloody sputum, Chronic Cough, Snoring and Wheezing. Breast Not Present- Breast Mass, Breast Pain, Nipple Discharge and Skin Changes. Cardiovascular Present- Chest Pain. Not Present- Difficulty Breathing Lying Down, Leg Cramps, Palpitations, Rapid Heart Rate, Shortness of Breath and Swelling of Extremities. Gastrointestinal Present- Change in Bowel Habits, Constipation and Vomiting. Not Present- Abdominal Pain, Bloating, Bloody Stool, Chronic diarrhea, Difficulty Swallowing, Excessive gas, Gets full quickly at meals, Hemorrhoids, Indigestion, Nausea and Rectal Pain. Female Genitourinary Not Present- Frequency, Nocturia, Painful Urination, Pelvic Pain and Urgency. Musculoskeletal Present- Back Pain. Not Present- Joint Pain, Joint Stiffness, Muscle Pain, Muscle Weakness and Swelling of Extremities. Neurological Not Present- Decreased Memory, Fainting, Headaches, Numbness, Seizures, Tingling, Tremor, Trouble walking and Weakness. Psychiatric Not Present- Anxiety, Bipolar, Change in Sleep Pattern, Depression, Fearful and Frequent crying. Endocrine Not Present- Cold Intolerance, Excessive Hunger, Hair Changes, Heat Intolerance, Hot flashes and New Diabetes. Hematology Not Present- Blood Thinners, Easy Bruising, Excessive bleeding, Gland problems, HIV and Persistent Infections.  Vitals (Anne Jefferson RMA; 11/20/2017 2:24 PM) 11/20/2017 2:24 PM Weight: 145 lb Height: 63in Body Surface Area: 1.69 m Body Mass Index: 25.69 kg/m  Temp.: 97.45F  Pulse: 86 (Regular)  BP: 118/84 (Sitting, Left Arm, Standard)       Physical Exam Anne Spruce, MD; 11/20/2017 3:30  PM) General Mental Status-Alert. General Appearance-Cooperative. Orientation-Oriented X4. Posture-Normal posture.  Integumentary Global Assessment Normal Exam - Head/Face: no  rashes, ulcers, lesions or evidence of photo damage. No palpable nodules or masses and Neck: no visible lesions or palpable masses.  Head and Neck Head-normocephalic, atraumatic with no lesions or palpable masses. Face Global Assessment - atraumatic. Thyroid Gland Characteristics - normal size and consistency.  Eye Eyeball - Bilateral-Extraocular movements intact. Sclera/Conjunctiva - Bilateral-No scleral icterus, No Discharge.  ENMT Nose and Sinuses Nose - no deformities observed, no swelling present.  Chest and Lung Exam Palpation Normal exam - Non-tender. Auscultation Breath sounds - Normal.  Cardiovascular Auscultation Rhythm - Regular. Heart Sounds - S1 WNL and S2 WNL. Carotid arteries - No Carotid bruit.  Abdomen Inspection Normal Exam - No Visible peristalsis, No Abnormal pulsations and No Paradoxical movements. Palpation/Percussion Normal exam - Soft, No Rebound tenderness, No Rigidity (guarding), No hepatosplenomegaly and No Palpable abdominal masses. Tenderness - Right Upper Quadrant. Gallbladder - Negative Murphy's sign.  Peripheral Vascular Upper Extremity Palpation - Pulses bilaterally normal. Lower Extremity Palpation - Edema - Bilateral - No edema.  Neurologic Neurologic evaluation reveals -normal sensation and normal coordination.  Neuropsychiatric Mental status exam performed with findings of-able to articulate well with normal speech/language, rate, volume and coherence and thought content normal with ability to perform basic computations and apply abstract reasoning.  Musculoskeletal Normal Exam - Bilateral-Upper Extremity Strength Normal and Lower Extremity Strength Normal.    Assessment & Plan Anne Spruce MD; 11/20/2017 2:54 PM) CHRONIC  CALCULOUS CHOLECYSTITIS (K80.10) Impression: 41 yo female with 4 months of epigastric/chest pain. She initially thought it was costochondritis but US shows gallstones and she is very tender subcostally. We discussed the etiology of gallstones and probably cause pain. We discussed exacerbating factors including fatty meals. We discussed the details of surgery for removal of the gallbladder including general anesthesia, 4 small incisions in the patient's abdomen, removal of the patient's gallbladder with the liver and common bile duct, and most likely outpatient procedure. We discussed risks of common bile duct injury, cystic duct stump leak, injury to liver, bleeding, infection, need for open procedure, and post cholecystectomy syndrome. The patient showed good understanding and wanted to proceed with laparoscopic cholecystectomy.  Her [place] also shows an odd mass in the hepatorenal fossa. I will discuss with the radiologist and also plan to get a CT scan to further evaluate this mass. Current Plans You are being scheduled for surgery- Our schedulers will call you.  You should hear from our office's scheduling department within 5 working days about the location, date, and time of surgery. We try to make accommodations for patient's preferences in scheduling surgery, but sometimes the OR schedule or the surgeon's schedule prevents [place] from making those accommodations.  If you have not heard from our office [phone]) in 5 working days, call the office and ask for your surgeon's nurse.  If you have other questions about your diagnosis, plan, or surgery, call the office and ask for your surgeon's nurse.  Pt Education - Pamphlet Given - Laparoscopic Gallbladder Surgery: discussed with patient and provided information.

## 2017-11-20 NOTE — H&P (Signed)
History of Present Illness Anne Spruce MD; 11/20/2017 3:29 PM) The patient is a 42 year old female who presents with non-malignant abdominal pain. The patient has a multiple month history of lower chest pain and epigastric pain. Pain is intermittent in nature. It is not related to food. He has been treated with NSAIDs for costochondritis without relief. He is getting more intense and she is missing work. She also notices nausea and vomiting when the pain is most intense. She denies diarrhea. She does have constipation for the last few months.   Past Surgical History ([name], [name]; 11/20/2017 2:23 PM) Cesarean Section - Multiple  Foot Surgery  Left. Oral Surgery   Diagnostic Studies History ([name], [name]; 11/20/2017 2:23 PM) Colonoscopy  never Mammogram  within last year Pap Smear  1-5 years ago  Allergies ([name], [name]; 11/20/2017 2:25 PM) No Known Drug Allergies [11/20/2017]: Allergies Reconciled   Medication History ([name], RMA; 11/20/2017 2:25 PM) OxyCODONE HCl (10MG  Tablet, Oral) Active. Medications Reconciled  Social History ([name], [name]; 11/20/2017 2:23 PM) Alcohol use  Occasional alcohol use. No caffeine use  No drug use  Tobacco use  Never smoker.  Family History ([name], [name]; 11/20/2017 2:23 PM) Arthritis  Mother.  Pregnancy / Birth History ([name], [name]; 11/20/2017 2:23 PM) Age at menarche  45 years. Contraceptive History  Oral contraceptives. Gravida  2 Length (months) of breastfeeding  >24 Maternal age  21-35 Para  3 Regular periods   Other Problems ([name], [name]; 11/20/2017 2:23 PM) Asthma  Chest pain     Review of Systems (Tanisha A. [name] RMA; 11/20/2017 2:23 PM) General Present- Fatigue and Night Sweats. Not Present- Appetite Loss, Chills, Fever, Weight Gain and Weight Loss. Skin Present- Rash. Not Present- Change in Wart/Mole, Dryness, Hives,  Jaundice, New Lesions, Non-Healing Wounds and Ulcer. HEENT Not Present- Earache, Hearing Loss, Hoarseness, Nose Bleed, Oral Ulcers, Ringing in the Ears, Seasonal Allergies, Sinus Pain, Sore Throat, Visual Disturbances, Wears glasses/contact lenses and Yellow Eyes. Respiratory Present- Difficulty Breathing. Not Present- Bloody sputum, Chronic Cough, Snoring and Wheezing. Breast Not Present- Breast Mass, Breast Pain, Nipple Discharge and Skin Changes. Cardiovascular Present- Chest Pain. Not Present- Difficulty Breathing Lying Down, Leg Cramps, Palpitations, Rapid Heart Rate, Shortness of Breath and Swelling of Extremities. Gastrointestinal Present- Change in Bowel Habits, Constipation and Vomiting. Not Present- Abdominal Pain, Bloating, Bloody Stool, Chronic diarrhea, Difficulty Swallowing, Excessive gas, Gets full quickly at meals, Hemorrhoids, Indigestion, Nausea and Rectal Pain. Female Genitourinary Not Present- Frequency, Nocturia, Painful Urination, Pelvic Pain and Urgency. Musculoskeletal Present- Back Pain. Not Present- Joint Pain, Joint Stiffness, Muscle Pain, Muscle Weakness and Swelling of Extremities. Neurological Not Present- Decreased Memory, Fainting, Headaches, Numbness, Seizures, Tingling, Tremor, Trouble walking and Weakness. Psychiatric Not Present- Anxiety, Bipolar, Change in Sleep Pattern, Depression, Fearful and Frequent crying. Endocrine Not Present- Cold Intolerance, Excessive Hunger, Hair Changes, Heat Intolerance, Hot flashes and New Diabetes. Hematology Not Present- Blood Thinners, Easy Bruising, Excessive bleeding, Gland problems, HIV and Persistent Infections.  Vitals (Tanisha A. [name] RMA; 11/20/2017 2:24 PM) 11/20/2017 2:24 PM Weight: 145 lb Height: 63in Body Surface Area: 1.69 m Body Mass Index: 25.69 kg/m  Temp.: 97.61F  Pulse: 86 (Regular)  BP: 118/84 (Sitting, Left Arm, Standard)       Physical Exam Anne Spruce, MD; 11/20/2017 3:30  PM) General Mental Status-Alert. General Appearance-Cooperative. Orientation-Oriented X4. Posture-Normal posture.  Integumentary Global Assessment Normal Exam - Head/Face: no  rashes, ulcers, lesions or evidence of photo damage. No palpable nodules or masses and Neck: no visible lesions or palpable masses.  Head and Neck Head-normocephalic, atraumatic with no lesions or palpable masses. Face Global Assessment - atraumatic. Thyroid Gland Characteristics - normal size and consistency.  Eye Eyeball - Bilateral-Extraocular movements intact. Sclera/Conjunctiva - Bilateral-No scleral icterus, No Discharge.  ENMT Nose and Sinuses Nose - no deformities observed, no swelling present.  Chest and Lung Exam Palpation Normal exam - Non-tender. Auscultation Breath sounds - Normal.  Cardiovascular Auscultation Rhythm - Regular. Heart Sounds - S1 WNL and S2 WNL. Carotid arteries - No Carotid bruit.  Abdomen Inspection Normal Exam - No Visible peristalsis, No Abnormal pulsations and No Paradoxical movements. Palpation/Percussion Normal exam - Soft, No Rebound tenderness, No Rigidity (guarding), No hepatosplenomegaly and No Palpable abdominal masses. Tenderness - Right Upper Quadrant. Gallbladder - Negative Murphy's sign.  Peripheral Vascular Upper Extremity Palpation - Pulses bilaterally normal. Lower Extremity Palpation - Edema - Bilateral - No edema.  Neurologic Neurologic evaluation reveals -normal sensation and normal coordination.  Neuropsychiatric Mental status exam performed with findings of-able to articulate well with normal speech/language, rate, volume and coherence and thought content normal with ability to perform basic computations and apply abstract reasoning.  Musculoskeletal Normal Exam - Bilateral-Upper Extremity Strength Normal and Lower Extremity Strength Normal.    Assessment & Plan Anne Spruce MD; 11/20/2017 2:54 PM) CHRONIC  CALCULOUS CHOLECYSTITIS (K80.10) Impression: 42 yo female with 4 months of epigastric/chest pain. She initially thought it was costochondritis but US shows gallstones and she is very tender subcostally. We discussed the etiology of gallstones and probably cause pain. We discussed exacerbating factors including fatty meals. We discussed the details of surgery for removal of the gallbladder including general anesthesia, 4 small incisions in the patient's abdomen, removal of the patient's gallbladder with the liver and common bile duct, and most likely outpatient procedure. We discussed risks of common bile duct injury, cystic duct stump leak, injury to liver, bleeding, infection, need for open procedure, and post cholecystectomy syndrome. The patient showed good understanding and wanted to proceed with laparoscopic cholecystectomy.  Her [place] also shows an odd mass in the hepatorenal fossa. I will discuss with the radiologist and also plan to get a CT scan to further evaluate this mass. Current Plans You are being scheduled for surgery- Our schedulers will call you.  You should hear from our office's scheduling department within 5 working days about the location, date, and time of surgery. We try to make accommodations for patient's preferences in scheduling surgery, but sometimes the OR schedule or the surgeon's schedule prevents [place] from making those accommodations.  If you have not heard from our office [phone]) in 5 working days, call the office and ask for your surgeon's nurse.  If you have other questions about your diagnosis, plan, or surgery, call the office and ask for your surgeon's nurse.  Pt Education - Pamphlet Given - Laparoscopic Gallbladder Surgery: discussed with patient and provided information.

## 2017-11-21 ENCOUNTER — Encounter (HOSPITAL_COMMUNITY): Payer: Self-pay | Admitting: *Deleted

## 2017-11-21 ENCOUNTER — Other Ambulatory Visit: Payer: Self-pay | Admitting: General Surgery

## 2017-11-21 ENCOUNTER — Other Ambulatory Visit: Payer: Self-pay

## 2017-11-21 DIAGNOSIS — K8010 Calculus of gallbladder with chronic cholecystitis without obstruction: Secondary | ICD-10-CM

## 2017-11-22 ENCOUNTER — Encounter (HOSPITAL_COMMUNITY)
Admission: RE | Disposition: A | Discharge status: Home / Self Care | Payer: Self-pay | Source: Ambulatory Visit | Attending: General Surgery

## 2017-11-22 ENCOUNTER — Ambulatory Visit (HOSPITAL_COMMUNITY)
Admission: RE | Admit: 2017-11-22 | Discharge: 2017-11-22 | Disposition: A | Discharge status: Home / Self Care | Payer: 59 | Source: Ambulatory Visit | Attending: General Surgery | Admitting: General Surgery

## 2017-11-22 ENCOUNTER — Ambulatory Visit (HOSPITAL_COMMUNITY): Payer: 59 | Admitting: Anesthesiology

## 2017-11-22 ENCOUNTER — Encounter (HOSPITAL_COMMUNITY): Payer: Self-pay | Admitting: *Deleted

## 2017-11-22 DIAGNOSIS — K8010 Calculus of gallbladder with chronic cholecystitis without obstruction: Secondary | ICD-10-CM | POA: Diagnosis not present

## 2017-11-22 DIAGNOSIS — Z79899 Other long term (current) drug therapy: Secondary | ICD-10-CM | POA: Insufficient documentation

## 2017-11-22 DIAGNOSIS — J45909 Unspecified asthma, uncomplicated: Secondary | ICD-10-CM | POA: Diagnosis not present

## 2017-11-22 DIAGNOSIS — F419 Anxiety disorder, unspecified: Secondary | ICD-10-CM | POA: Diagnosis not present

## 2017-11-22 DIAGNOSIS — Z7951 Long term (current) use of inhaled steroids: Secondary | ICD-10-CM | POA: Insufficient documentation

## 2017-11-22 HISTORY — DX: Calculus of bile duct without cholangitis or cholecystitis without obstruction: K80.50

## 2017-11-22 HISTORY — DX: Nausea with vomiting, unspecified: R11.2

## 2017-11-22 HISTORY — PX: CHOLECYSTECTOMY: SHX55

## 2017-11-22 HISTORY — DX: Other specified postprocedural states: Z98.890

## 2017-11-22 HISTORY — DX: Calculus of gallbladder without cholecystitis without obstruction: K80.20

## 2017-11-22 HISTORY — DX: Pain, unspecified: R52

## 2017-11-22 HISTORY — DX: Anxiety disorder, unspecified: F41.9

## 2017-11-22 LAB — PREGNANCY, URINE: Preg Test, Ur: NEGATIVE

## 2017-11-22 SURGERY — LAPAROSCOPIC CHOLECYSTECTOMY
Anesthesia: General | Site: Abdomen

## 2017-11-22 MED ORDER — ENSURE PRE-SURGERY PO LIQD
296.0000 mL | Freq: Once | ORAL | Status: DC
  Filled 2017-11-22: qty 296

## 2017-11-22 MED ORDER — ROCURONIUM BROMIDE 10 MG/ML (PF) SYRINGE
PREFILLED_SYRINGE | INTRAVENOUS | Status: DC | PRN
  Administered 2017-11-22: 40 mg via INTRAVENOUS
  Administered 2017-11-22: 5 mg via INTRAVENOUS

## 2017-11-22 MED ORDER — OXYCODONE HCL 5 MG PO TABS
ORAL_TABLET | ORAL | Status: AC
  Filled 2017-11-22: qty 1

## 2017-11-22 MED ORDER — HYDROCODONE-ACETAMINOPHEN 5-325 MG PO TABS: 1.0000 | ORAL_TABLET | Freq: Four times a day (QID) | ORAL | 0 refills | Status: DC | PRN

## 2017-11-22 MED ORDER — DEXAMETHASONE SODIUM PHOSPHATE 10 MG/ML IJ SOLN
INTRAMUSCULAR | Status: AC
  Filled 2017-11-22: qty 2

## 2017-11-22 MED ORDER — DEXAMETHASONE SODIUM PHOSPHATE 10 MG/ML IJ SOLN
INTRAMUSCULAR | Status: DC | PRN
  Administered 2017-11-22: 10 mg via INTRAVENOUS

## 2017-11-22 MED ORDER — PROPOFOL 10 MG/ML IV BOLUS
INTRAVENOUS | Status: AC
  Filled 2017-11-22: qty 20

## 2017-11-22 MED ORDER — CHLORHEXIDINE GLUCONATE 4 % EX LIQD
60.0000 mL | Freq: Once | CUTANEOUS | Status: DC
Start: 2017-11-23 — End: 2017-11-22

## 2017-11-22 MED ORDER — MIDAZOLAM HCL 5 MG/5ML IJ SOLN
INTRAMUSCULAR | Status: DC | PRN
  Administered 2017-11-22: 2 mg via INTRAVENOUS

## 2017-11-22 MED ORDER — ACETAMINOPHEN 500 MG PO TABS
1000.0000 mg | ORAL_TABLET | ORAL | Status: AC
  Administered 2017-11-22: 1000 mg via ORAL
  Filled 2017-11-22: qty 2

## 2017-11-22 MED ORDER — ONDANSETRON HCL 4 MG/2ML IJ SOLN
INTRAMUSCULAR | Status: DC | PRN
  Administered 2017-11-22 (×2): 4 mg via INTRAVENOUS

## 2017-11-22 MED ORDER — SCOPOLAMINE 1 MG/3DAYS TD PT72
1.0000 | MEDICATED_PATCH | TRANSDERMAL | Status: DC
  Administered 2017-11-22: 1.5 mg via TRANSDERMAL
  Filled 2017-11-22: qty 1

## 2017-11-22 MED ORDER — CEFOTETAN DISODIUM-DEXTROSE 2-2.08 GM-%(50ML) IV SOLR
2.0000 g | INTRAVENOUS | Status: AC
  Administered 2017-11-22: 2 g via INTRAVENOUS
  Filled 2017-11-22: qty 50

## 2017-11-22 MED ORDER — BUPIVACAINE HCL (PF) 0.25 % IJ SOLN
INTRAMUSCULAR | Status: AC
  Filled 2017-11-22: qty 30

## 2017-11-22 MED ORDER — MIDAZOLAM HCL 2 MG/2ML IJ SOLN
INTRAMUSCULAR | Status: AC
  Filled 2017-11-22: qty 2

## 2017-11-22 MED ORDER — LIDOCAINE 2% (20 MG/ML) 5 ML SYRINGE
INTRAMUSCULAR | Status: DC | PRN
  Administered 2017-11-22: 100 mg via INTRAVENOUS

## 2017-11-22 MED ORDER — LACTATED RINGERS IV SOLN
INTRAVENOUS | Status: DC
  Administered 2017-11-22 (×2): via INTRAVENOUS

## 2017-11-22 MED ORDER — ROCURONIUM BROMIDE 10 MG/ML (PF) SYRINGE
PREFILLED_SYRINGE | INTRAVENOUS | Status: AC
  Filled 2017-11-22: qty 5

## 2017-11-22 MED ORDER — LIDOCAINE 2% (20 MG/ML) 5 ML SYRINGE
INTRAMUSCULAR | Status: AC
  Filled 2017-11-22: qty 5

## 2017-11-22 MED ORDER — LACTATED RINGERS IR SOLN
Status: DC | PRN
  Administered 2017-11-22: 1000 mL

## 2017-11-22 MED ORDER — BUPIVACAINE-EPINEPHRINE 0.5% -1:200000 IJ SOLN
INTRAMUSCULAR | Status: DC | PRN
  Administered 2017-11-22: 30 mL

## 2017-11-22 MED ORDER — LIDOCAINE 2% (20 MG/ML) 5 ML SYRINGE
INTRAMUSCULAR | Status: AC
  Filled 2017-11-22: qty 10

## 2017-11-22 MED ORDER — OXYCODONE HCL 5 MG PO TABS
5.0000 mg | ORAL_TABLET | Freq: Once | ORAL | Status: AC | PRN
  Administered 2017-11-22: 5 mg via ORAL

## 2017-11-22 MED ORDER — CELECOXIB 200 MG PO CAPS
ORAL_CAPSULE | ORAL | Status: AC
  Filled 2017-11-22: qty 1

## 2017-11-22 MED ORDER — FENTANYL CITRATE (PF) 100 MCG/2ML IJ SOLN
INTRAMUSCULAR | Status: DC | PRN
  Administered 2017-11-22: 25 ug via INTRAVENOUS
  Administered 2017-11-22 (×3): 50 ug via INTRAVENOUS
  Administered 2017-11-22: 100 ug via INTRAVENOUS
  Administered 2017-11-22: 25 ug via INTRAVENOUS

## 2017-11-22 MED ORDER — GABAPENTIN 300 MG PO CAPS
300.0000 mg | ORAL_CAPSULE | ORAL | Status: AC
  Administered 2017-11-22: 300 mg via ORAL
  Filled 2017-11-22: qty 1

## 2017-11-22 MED ORDER — BUPIVACAINE-EPINEPHRINE (PF) 0.5% -1:200000 IJ SOLN
INTRAMUSCULAR | Status: AC
  Filled 2017-11-22: qty 30

## 2017-11-22 MED ORDER — FENTANYL CITRATE (PF) 100 MCG/2ML IJ SOLN
INTRAMUSCULAR | Status: AC
  Filled 2017-11-22: qty 2

## 2017-11-22 MED ORDER — ONDANSETRON HCL 4 MG/2ML IJ SOLN
INTRAMUSCULAR | Status: AC
Start: 2017-11-22 — End: 2017-11-22
  Filled 2017-11-22: qty 4

## 2017-11-22 MED ORDER — HYDROMORPHONE HCL 1 MG/ML IJ SOLN
0.2500 mg | INTRAMUSCULAR | Status: DC | PRN
  Administered 2017-11-22 (×3): 0.5 mg via INTRAVENOUS

## 2017-11-22 MED ORDER — SUGAMMADEX SODIUM 200 MG/2ML IV SOLN
INTRAVENOUS | Status: AC
  Filled 2017-11-22: qty 2

## 2017-11-22 MED ORDER — HYDROMORPHONE HCL 1 MG/ML IJ SOLN
INTRAMUSCULAR | Status: AC
  Filled 2017-11-22: qty 2

## 2017-11-22 MED ORDER — 0.9 % SODIUM CHLORIDE (POUR BTL) OPTIME
TOPICAL | Status: DC | PRN
  Administered 2017-11-22: 1000 mL

## 2017-11-22 MED ORDER — OXYCODONE HCL 5 MG/5ML PO SOLN
5.0000 mg | Freq: Once | ORAL | Status: AC | PRN
  Filled 2017-11-22: qty 5

## 2017-11-22 MED ORDER — ONDANSETRON HCL 4 MG/2ML IJ SOLN
INTRAMUSCULAR | Status: AC
  Filled 2017-11-22: qty 2

## 2017-11-22 MED ORDER — IBUPROFEN 800 MG PO TABS: 800.0000 mg | ORAL_TABLET | Freq: Three times a day (TID) | ORAL | 0 refills | Status: DC | PRN

## 2017-11-22 MED ORDER — FENTANYL CITRATE (PF) 100 MCG/2ML IJ SOLN
INTRAMUSCULAR | Status: AC
Start: 2017-11-22 — End: 2017-11-22
  Filled 2017-11-22: qty 2

## 2017-11-22 MED ORDER — GLYCOPYRROLATE 0.2 MG/ML IV SOSY
PREFILLED_SYRINGE | INTRAVENOUS | Status: AC
  Filled 2017-11-22: qty 10

## 2017-11-22 MED ORDER — ESMOLOL HCL 100 MG/10ML IV SOLN
INTRAVENOUS | Status: AC
  Filled 2017-11-22: qty 10

## 2017-11-22 MED ORDER — SUGAMMADEX SODIUM 200 MG/2ML IV SOLN
INTRAVENOUS | Status: DC | PRN
  Administered 2017-11-22: 200 mg via INTRAVENOUS

## 2017-11-22 MED ORDER — GLYCOPYRROLATE 0.2 MG/ML IV SOSY
PREFILLED_SYRINGE | INTRAVENOUS | Status: DC | PRN
  Administered 2017-11-22: .2 mg via INTRAVENOUS

## 2017-11-22 MED ORDER — ESMOLOL HCL 100 MG/10ML IV SOLN
INTRAVENOUS | Status: DC | PRN
  Administered 2017-11-22 (×2): 10 mg via INTRAVENOUS

## 2017-11-22 MED ORDER — CELECOXIB 200 MG PO CAPS
400.0000 mg | ORAL_CAPSULE | ORAL | Status: AC
  Administered 2017-11-22: 400 mg via ORAL
  Filled 2017-11-22: qty 2

## 2017-11-22 MED ORDER — PROPOFOL 10 MG/ML IV BOLUS
INTRAVENOUS | Status: DC | PRN
  Administered 2017-11-22: 150 mg via INTRAVENOUS
  Administered 2017-11-22: 20 mg via INTRAVENOUS

## 2017-11-22 MED ORDER — CHLORHEXIDINE GLUCONATE 4 % EX LIQD: 60.0000 mL | Freq: Once | CUTANEOUS | Status: DC

## 2017-11-22 SURGICAL SUPPLY — 42 items
APPLIER CLIP ROT 10 11.4 M/L (STAPLE)
BANDAGE ADH SHEER 1  50/CT (GAUZE/BANDAGES/DRESSINGS) ×8 IMPLANT
BENZOIN TINCTURE PRP APPL 2/3 (GAUZE/BANDAGES/DRESSINGS) ×2 IMPLANT
CABLE HIGH FREQUENCY MONO STRZ (ELECTRODE) ×2 IMPLANT
CATH CHOLANG 76X19 KUMAR (CATHETERS) IMPLANT
CHLORAPREP W/TINT 26ML (MISCELLANEOUS) ×2 IMPLANT
CLIP APPLIE ROT 10 11.4 M/L (STAPLE) IMPLANT
CLIP VESOLOCK LG 6/CT PURPLE (CLIP) IMPLANT
CLIP VESOLOCK MED LG 6/CT (CLIP) ×2 IMPLANT
CLOSURE STERI-STRIP 1/4X4 (GAUZE/BANDAGES/DRESSINGS) ×2 IMPLANT
COVER MAYO STAND STRL (DRAPES) IMPLANT
COVER SURGICAL LIGHT HANDLE (MISCELLANEOUS) ×2 IMPLANT
DECANTER SPIKE VIAL GLASS SM (MISCELLANEOUS) ×2 IMPLANT
DERMABOND ADVANCED (GAUZE/BANDAGES/DRESSINGS)
DERMABOND ADVANCED .7 DNX12 (GAUZE/BANDAGES/DRESSINGS) IMPLANT
DRAIN CHANNEL 19F RND (DRAIN) IMPLANT
DRAPE C-ARM 42X120 X-RAY (DRAPES) IMPLANT
EVACUATOR SILICONE 100CC (DRAIN) IMPLANT
GLOVE BIOGEL PI IND STRL 7.0 (GLOVE) ×1 IMPLANT
GLOVE BIOGEL PI INDICATOR 7.0 (GLOVE) ×1
GLOVE SURG SS PI 7.0 STRL IVOR (GLOVE) ×2 IMPLANT
GOWN STRL REUS W/TWL LRG LVL3 (GOWN DISPOSABLE) ×2 IMPLANT
GOWN STRL REUS W/TWL XL LVL3 (GOWN DISPOSABLE) ×4 IMPLANT
GRASPER SUT TROCAR 14GX15 (MISCELLANEOUS) IMPLANT
KIT BASIN OR (CUSTOM PROCEDURE TRAY) ×2 IMPLANT
POUCH RETRIEVAL ECOSAC 10 (ENDOMECHANICALS) ×1 IMPLANT
POUCH RETRIEVAL ECOSAC 10MM (ENDOMECHANICALS) ×1
SCISSORS LAP 5X35 DISP (ENDOMECHANICALS) ×2 IMPLANT
SET IRRIG TUBING LAPAROSCOPIC (IRRIGATION / IRRIGATOR) ×2 IMPLANT
SHEARS HARMONIC ACE PLUS 36CM (ENDOMECHANICALS) IMPLANT
SLEEVE XCEL OPT CAN 5 100 (ENDOMECHANICALS) ×4 IMPLANT
STOPCOCK 4 WAY LG BORE MALE ST (IV SETS) IMPLANT
STRIP CLOSURE SKIN 1/2X4 (GAUZE/BANDAGES/DRESSINGS) ×2 IMPLANT
SUT ETHILON 2 0 PS N (SUTURE) IMPLANT
SUT MNCRL AB 4-0 PS2 18 (SUTURE) ×2 IMPLANT
SUT VICRYL 0 ENDOLOOP (SUTURE) IMPLANT
TOWEL OR 17X26 10 PK STRL BLUE (TOWEL DISPOSABLE) ×2 IMPLANT
TOWEL OR NON WOVEN STRL DISP B (DISPOSABLE) IMPLANT
TRAY LAPAROSCOPIC (CUSTOM PROCEDURE TRAY) ×2 IMPLANT
TROCAR BLADELESS OPT 5 100 (ENDOMECHANICALS) ×2 IMPLANT
TROCAR XCEL NON-BLD 11X100MML (ENDOMECHANICALS) ×2 IMPLANT
TUBING INSUF HEATED (TUBING) ×2 IMPLANT

## 2017-11-22 NOTE — Anesthesia Preprocedure Evaluation (Addendum)
Anesthesia Evaluation  Patient identified by MRN, date of birth, ID band Patient awake    Reviewed: Allergy & Precautions, NPO status , Patient's Chart, lab work & pertinent test results  History of Anesthesia Complications (+) PONV and history of anesthetic complications  Airway Mallampati: I  TM Distance: >3 FB Neck ROM: Full    Dental no notable dental hx.    Pulmonary asthma (mild, controlled) ,    Pulmonary exam normal breath sounds clear to auscultation       Cardiovascular negative cardio ROS Normal cardiovascular exam Rhythm:Regular Rate:Normal     Neuro/Psych Anxiety negative neurological ROS     GI/Hepatic Neg liver ROS,   Endo/Other  negative endocrine ROS  Renal/GU negative Renal ROS     Musculoskeletal negative musculoskeletal ROS (+)   Abdominal   Peds  Hematology negative hematology ROS (+)   Anesthesia Other Findings Chronic Calculus Cholecystitis  Reproductive/Obstetrics hcg negative                            Anesthesia Physical Anesthesia Plan  ASA: II  Anesthesia Plan: General   Post-op Pain Management:    Induction: Intravenous  PONV Risk Score and Plan: 4 or greater and Ondansetron, Dexamethasone, Treatment may vary due to age or medical condition, Midazolam, Scopolamine patch - Pre-op and Propofol infusion  Airway Management Planned: Oral ETT  Additional Equipment:   Intra-op Plan:   Post-operative Plan: Extubation in OR  Informed Consent: I have reviewed the patients History and Physical, chart, labs and discussed the procedure including the risks, benefits and alternatives for the proposed anesthesia with the patient or authorized representative who has indicated his/her understanding and acceptance.   Dental advisory given  Plan Discussed with: CRNA  Anesthesia Plan Comments:        Anesthesia Quick Evaluation

## 2017-11-22 NOTE — Transfer of Care (Signed)
Immediate Anesthesia Transfer of Care Note  Patient: Anne Jefferson  Procedure(s) Performed: Procedure(s): LAPAROSCOPIC CHOLECYSTECTOMY ERAS PATHWAY (N/A)  Patient Location: PACU  Anesthesia Type:General  Level of Consciousness:  sedated, patient cooperative and responds to stimulation  Airway & Oxygen Therapy:Patient Spontanous Breathing and Patient connected to face mask oxgen  Post-op Assessment:  Report given to PACU RN and Post -op Vital signs reviewed and stable  Post vital signs:  Reviewed and stable  Last Vitals:  Vitals:   11/22/17 0743  BP: 112/70  Pulse: (!) 56  Resp: 18  Temp: 36.7 C  SpO2: 680%    Complications: No apparent anesthesia complications

## 2017-11-22 NOTE — Anesthesia Postprocedure Evaluation (Signed)
Anesthesia Post Note  Patient: Jonise Hoffman-Dugger  Procedure(s) Performed: LAPAROSCOPIC CHOLECYSTECTOMY ERAS PATHWAY (N/A Abdomen)     Patient location during evaluation: PACU Anesthesia Type: General Level of consciousness: awake and alert Pain management: pain level controlled Vital Signs Assessment: post-procedure vital signs reviewed and stable Respiratory status: spontaneous breathing, nonlabored ventilation, respiratory function stable and patient connected to nasal cannula oxygen Cardiovascular status: blood pressure returned to baseline and stable Postop Assessment: no apparent nausea or vomiting Anesthetic complications: no    Last Vitals:  Vitals:   11/22/17 1200 11/22/17 1310  BP: 111/67 116/69  Pulse: 60 61  Resp: 15 16  Temp: (!) 36.4 C 36.4 C  SpO2: 97% 96%    Last Pain:  Vitals:   11/22/17 1310  TempSrc:   PainSc: 3                  Ryan P Ellender

## 2017-11-22 NOTE — Anesthesia Procedure Notes (Signed)
Procedure Name: Intubation Date/Time: 11/22/2017 9:32 AM Performed by: Lavina Hamman, CRNA Pre-anesthesia Checklist: Patient identified, Emergency Drugs available, Suction available, Patient being monitored and Timeout performed Patient Re-evaluated:Patient Re-evaluated prior to induction Oxygen Delivery Method: Circle system utilized Preoxygenation: Pre-oxygenation with 100% oxygen Induction Type: IV induction Ventilation: Mask ventilation without difficulty Laryngoscope Size: Mac and 4 Grade View: Grade I Tube type: Oral Tube size: 7.0 mm Number of attempts: 1 Airway Equipment and Method: Stylet Placement Confirmation: ETT inserted through vocal cords under direct vision,  positive ETCO2,  CO2 detector and breath sounds checked- equal and bilateral Secured at: 21 cm Tube secured with: Tape Dental Injury: Teeth and Oropharynx as per pre-operative assessment

## 2017-11-22 NOTE — Interval H&P Note (Signed)
History and Physical Interval Note:  11/22/2017 9:19 AM  Anne Jefferson  has presented today for surgery, with the diagnosis of Chronic Calculus Cholecystitis  The various methods of treatment have been discussed with the patient and family. After consideration of risks, benefits and other options for treatment, the patient has consented to  Procedure(s): [place] (N/A) as a surgical intervention .  The patient's history has been reviewed, patient examined, no change in status, stable for surgery.  I have reviewed the patient's chart and labs.  Questions were answered to the patient's satisfaction.     Arta Bruce Kinsinger

## 2017-11-22 NOTE — Op Note (Signed)
PATIENT:  Anne Jefferson  42 y.o. female  PRE-OPERATIVE DIAGNOSIS:  Chronic Calculus Cholecystitis  POST-OPERATIVE DIAGNOSIS:  Chronic Calculus Cholecystitis  PROCEDURE:  Procedure(s): LAPAROSCOPIC CHOLECYSTECTOMY ERAS PATHWAY   SURGEON:  Surgeon(s): Siah Kannan, Arta Bruce, MD  ASSISTANT: none  ANESTHESIA:   local and general  Indications for procedure: [name] is a 42 y.o. female with symptoms of Abdominal pain and Nausea and vomiting consistent with gallbladder disease, Confirmed by Ultrasound.  Description of procedure: The patient was brought into the operative suite, placed supine. Anesthesia was administered with endotracheal tube. Patient was strapped in place and foot board was secured. All pressure points were offloaded by foam padding. The patient was prepped and draped in the usual sterile fashion.  A small incision was made to the right of the umbilicus. A 54mm trocar was inserted into the peritoneal cavity with optical entry. Pneumoperitoneum was applied with high flow low pressure. 2 27mm trocars were placed in the RUQ. A 82mm trocar was placed in the subxiphoid space. 30 ml marcaine was infused to the subxiphoid space and lateral upper right abdomen in the preperitoneal spaces. Next the patient was placed in reverse trendelenberg. The gallbladder is tense and white. A drainage needle was used to remove a thick brown bile from the gallbladder to allow retraction.  The gallbladder was retracted cephalad and lateral. The peritoneum was reflected off the infundibulum working lateral to medial. The cystic duct and cystic artery were identified and further dissection revealed a critical view. The cystic duct and cystic artery were doubly clipped and ligated.   The gallbladder was removed off the liver bed with cautery. The Gallbladder was placed in a specimen bag. The gallbladder fossa was irrigated and hemostasis was applied with cautery. The gallbladder was  removed via the 91mm trocar. The fascial defect was closed with interrupted 0 vicryl suture via laparoscopic trans-fascial suture passer. Pneumoperitoneum was removed, all trocar were removed. All incisions were closed with 4-0 monocryl subcuticular stitch. The patient woke from anesthesia and was brought to PACU in stable condition. All counts were correct  Findings: inflamed gallbladder  Specimen: gallbladder  Blood loss: 30 ml  Local anesthesia: 30 ml marcaine  Complications: none  PLAN OF CARE: Discharge to home after PACU  PATIENT DISPOSITION:  PACU - hemodynamically stable.  Gurney Maxin, M.D. General, Bariatric, & Minimally Invasive Surgery [facility]

## 2017-11-24 ENCOUNTER — Encounter (HOSPITAL_COMMUNITY): Payer: Self-pay | Admitting: Emergency Medicine

## 2017-11-24 ENCOUNTER — Emergency Department (HOSPITAL_COMMUNITY)
Admission: EM | Admit: 2017-11-24 | Discharge: 2017-11-24 | Disposition: A | Discharge status: Home / Self Care | Payer: 59 | Attending: Emergency Medicine | Admitting: Emergency Medicine

## 2017-11-24 DIAGNOSIS — R21 Rash and other nonspecific skin eruption: Secondary | ICD-10-CM | POA: Diagnosis not present

## 2017-11-24 DIAGNOSIS — Z9889 Other specified postprocedural states: Secondary | ICD-10-CM | POA: Diagnosis not present

## 2017-11-24 DIAGNOSIS — L299 Pruritus, unspecified: Secondary | ICD-10-CM | POA: Diagnosis not present

## 2017-11-24 DIAGNOSIS — J029 Acute pharyngitis, unspecified: Secondary | ICD-10-CM

## 2017-11-24 DIAGNOSIS — L509 Urticaria, unspecified: Secondary | ICD-10-CM

## 2017-11-24 DIAGNOSIS — B029 Zoster without complications: Secondary | ICD-10-CM | POA: Diagnosis not present

## 2017-11-24 LAB — CBC WITH DIFFERENTIAL/PLATELET
BASOS ABS: 0 10*3/uL (ref 0.0–0.1)
BASOS PCT: 0 %
Eosinophils Absolute: 0.2 10*3/uL (ref 0.0–0.7)
Eosinophils Relative: 3 %
HEMATOCRIT: 36 % (ref 36.0–46.0)
HEMOGLOBIN: 11.8 g/dL — AB (ref 12.0–15.0)
Lymphocytes Relative: 25 %
Lymphs Abs: 1.3 10*3/uL (ref 0.7–4.0)
MCH: 28.8 pg (ref 26.0–34.0)
MCHC: 32.8 g/dL (ref 30.0–36.0)
MCV: 87.8 fL (ref 78.0–100.0)
MONOS PCT: 8 %
Monocytes Absolute: 0.4 10*3/uL (ref 0.1–1.0)
NEUTROS ABS: 3.2 10*3/uL (ref 1.7–7.7)
NEUTROS PCT: 64 %
Platelets: 143 10*3/uL — ABNORMAL LOW (ref 150–400)
RBC: 4.1 MIL/uL (ref 3.87–5.11)
RDW: 12.9 % (ref 11.5–15.5)
WBC: 5 10*3/uL (ref 4.0–10.5)

## 2017-11-24 LAB — COMPREHENSIVE METABOLIC PANEL
ALK PHOS: 41 U/L (ref 38–126)
ALT: 109 U/L — AB (ref 14–54)
ANION GAP: 9 (ref 5–15)
AST: 51 U/L — ABNORMAL HIGH (ref 15–41)
Albumin: 3.7 g/dL (ref 3.5–5.0)
BILIRUBIN TOTAL: 1 mg/dL (ref 0.3–1.2)
BUN: 11 mg/dL (ref 6–20)
CALCIUM: 9 mg/dL (ref 8.9–10.3)
CO2: 22 mmol/L (ref 22–32)
CREATININE: 0.68 mg/dL (ref 0.44–1.00)
Chloride: 107 mmol/L (ref 101–111)
Glucose, Bld: 111 mg/dL — ABNORMAL HIGH (ref 65–99)
Potassium: 3.6 mmol/L (ref 3.5–5.1)
Sodium: 138 mmol/L (ref 135–145)
TOTAL PROTEIN: 6.5 g/dL (ref 6.5–8.1)

## 2017-11-24 MED ORDER — FAMOTIDINE IN NACL 20-0.9 MG/50ML-% IV SOLN
20.0000 mg | Freq: Once | INTRAVENOUS | Status: AC
  Administered 2017-11-24: 20 mg via INTRAVENOUS
  Filled 2017-11-24: qty 50

## 2017-11-24 MED ORDER — FAMOTIDINE 40 MG PO TABS: 40.0000 mg | ORAL_TABLET | Freq: Every day | ORAL | 0 refills | Status: DC

## 2017-11-24 MED ORDER — OXYCODONE-ACETAMINOPHEN 5-325 MG PO TABS: 1.0000 | ORAL_TABLET | Freq: Four times a day (QID) | ORAL | 0 refills | Status: DC | PRN

## 2017-11-24 MED ORDER — EPINEPHRINE 0.3 MG/0.3ML IJ SOAJ: 0.3000 mg | Freq: Once | INTRAMUSCULAR | 0 refills | Status: AC

## 2017-11-24 MED ORDER — SODIUM CHLORIDE 0.9 % IV BOLUS
1000.0000 mL | Freq: Once | INTRAVENOUS | Status: AC
  Administered 2017-11-24: 1000 mL via INTRAVENOUS

## 2017-11-24 MED ORDER — HYDROXYZINE HCL 25 MG PO TABS: 25.0000 mg | ORAL_TABLET | Freq: Four times a day (QID) | ORAL | 0 refills | Status: DC | PRN

## 2017-11-24 MED ORDER — HYDROXYZINE HCL 10 MG PO TABS
10.0000 mg | ORAL_TABLET | Freq: Once | ORAL | Status: AC
  Administered 2017-11-24: 10 mg via ORAL
  Filled 2017-11-24: qty 1

## 2017-11-24 NOTE — ED Notes (Signed)
Pt and family updated on plan of care  

## 2017-11-24 NOTE — ED Provider Notes (Signed)
Emergency Department Provider Note   I have reviewed the triage vital signs and the nursing notes.   HISTORY  Chief Complaint Allergic Reaction   HPI Anne Jefferson is a 42 y.o. female with PMH of gallstones POD 2 s/p lap chole who presents to the emergency department for evaluation of itchy, migratory rash with eye swelling and sore throat.  The patient did not have any sore throat or other symptoms immediately after surgery.  Yesterday she developed an itchy rash all over her body and sore throat.  She has pain with swallowing but is able to drink fluids and eat soft foods without difficulty.  No coughing or choking.  No vomiting.  She denies any abdominal pain.  No fevers or chills.  Patient states she does not take Benadryl because in the past it has precipitated asthma exacerbations so has taken no medications for the itching.  Her only new medication was hydrocodone prescribed for postoperative pain.    Past Medical History:  Diagnosis Date  . Abnormal Pap smear   . Anxiety    no meds  . Asthma    mild, no issue for years no  inhaler use except in winter  . Biliary colic   . Blood type, Rh negative   . Gallstones   . H/O dysmenorrhea   . Infertility, female   . Nausea and vomiting   . Pain    back and chest realted to gallbladder since november 2018  . PONV (postoperative nausea and vomiting)    severe, needs heavy anti nausea meds    Patient Active Problem List   Diagnosis Date Noted  . Infertility, female   . Abnormal Pap smear   . H/O dysmenorrhea   . Asthma   . Blood type, Rh negative   . METATARSALGIA 12/18/2007  . BUNIONS, LEFT FOOT 12/18/2007  . UNEQUAL LEG LENGTH 12/18/2007  . SPRAIN&STRAIN OF UNSPECIFIED SITE OF KNEE&LEG 11/13/2007    Past Surgical History:  Procedure Laterality Date  .  c sections     x 2  3 births last was twins  . CHOLECYSTECTOMY N/A 11/22/2017   Procedure: LAPAROSCOPIC CHOLECYSTECTOMY ERAS PATHWAY;  Surgeon: Kieth Brightly  Arta Bruce, MD;  Location: [facility];  Service: General;  Laterality: N/A;  . DILATION AND CURETTAGE OF UTERUS  2011  . LEEP  12/23/2009  . WISDOM TOOTH EXTRACTION      Current Outpatient Rx  . Order #: [id number] Class: Print  . Order #: [id number] Class: Print  . Order #: [id number] Class: Print  . Order #: [id number] Class: Print  . Order #: [id number] Class: Historical Med  . Order #: [id number] Class: Historical Med  . Order #: [id number] Class: Print    Allergies Promethazine and Theophyllines  Family History  Problem Relation Age of Onset  . Other Mother        Varicosities  . Diabetes Mother   . Diabetes Maternal Grandmother   . Skin cancer Maternal Grandmother     Social History Social History   Tobacco Use  . Smoking status: Never Smoker  . Smokeless tobacco: Never Used  Substance Use Topics  . Alcohol use: No  . Drug use: No    Review of Systems  Constitutional: No fever/chills Eyes: No visual changes. Positive eye swelling.  ENT: Positive sore throat. Cardiovascular: Denies chest pain. Respiratory: Denies shortness of breath. Gastrointestinal: No abdominal pain. No nausea, no vomiting.  No diarrhea.  No constipation. Genitourinary: Negative for dysuria. Musculoskeletal: Negative for back pain. Skin: Positive itchy  rash.  Neurological: Negative for headaches, focal weakness or numbness.  10-point ROS otherwise negative.  ____________________________________________   PHYSICAL EXAM:  VITAL SIGNS: ED Triage Vitals  Enc Vitals Group     BP 11/24/17 0804 124/78     Pulse --      Resp 11/24/17 0804 20     Temp 11/24/17 0804 98.7 F (37.1 C)     Temp Source 11/24/17 0804 Oral     SpO2 11/24/17 0804 98 %     Pain Score 11/24/17 0805 4   Constitutional: Alert and oriented. Well appearing and in no acute distress. Eyes: Conjunctivae are normal. Very mild periorbital edema.  Head: Atraumatic. Nose: No congestion/rhinnorhea. Mouth/Throat: Mucous membranes are  moist. Normal posterior pharynx. Normal tongue. No lesions. Managing oral secretions. Soft submandibular compartment.  Neck: No stridor.  Cardiovascular: Normal rate, regular rhythm. Good peripheral circulation. Grossly normal heart sounds.   Respiratory: Normal respiratory effort.  No retractions. Lungs CTAB. Gastrointestinal: Soft with mild diffuse tenderness which seems appropriate for the post-op setting. Well-appearing abdominal incisions. No distention.  Musculoskeletal: No lower extremity tenderness nor edema. No gross deformities of extremities. Neurologic:  Normal speech and language. No gross focal neurologic deficits are appreciated.  Skin:  Skin is warm, dry and intact. No rash noted.  ____________________________________________   LABS (all labs ordered are listed, but only abnormal results are displayed)  Labs Reviewed  COMPREHENSIVE METABOLIC PANEL - Abnormal; Notable for the following components:      Result Value   Glucose, Bld 111 (*)    AST 51 (*)    ALT 109 (*)    All other components within normal limits  CBC WITH DIFFERENTIAL/PLATELET - Abnormal; Notable for the following components:   Hemoglobin 11.8 (*)    Platelets 143 (*)    All other components within normal limits   ____________________________________________   PROCEDURES  Procedure(s) performed:   Procedures  None ____________________________________________   INITIAL IMPRESSION / ASSESSMENT AND PLAN / ED COURSE  Pertinent labs & imaging results that were available during my care of the patient were reviewed by me and considered in my medical decision making (see chart for details).  [facility] department for evaluation of itchy rash and periorbital swelling.  She also has some sore throat.  No remarkable findings on exam.  The sore throat may be related to intubation during her recent lap chole.  Do not see any significant erythema, exudate, peritonsillar abscess to explain her symptoms.  Patient  has no findings to suggest anaphylaxis or require epinephrine.  Patient has had side effects from Benadryl in the past so was given Atarax for itching along with Pepcid.  Plan to defer steroids in the immediate postop setting given concern for wound healing.   Labs reviewed with no acute findings. Patient with mild improvement with Pepcid and Atarax. Discussed discharge home with Epipen Rx and Atarax/Pepcid. No steroid on POD # 2 with concern that steroid could inhibit wound healing. Changed Vicodin Rx to Oxycodone. Patient to f/u with surgery PRN and allergist if symptoms persist.   At this time, I do not feel there is any life-threatening condition present. I have reviewed and discussed all results (EKG, imaging, lab, urine as appropriate), exam findings with patient. I have reviewed nursing notes and appropriate previous records.  I feel the patient is safe to be discharged home without further emergent workup. Discussed usual and customary return precautions. Patient and family (if present) verbalize understanding and are comfortable with this  plan.  Patient will follow-up with their primary care provider. If they do not have a primary care provider, information for follow-up has been provided to them. All questions have been answered.  ____________________________________________  FINAL CLINICAL IMPRESSION(S) / ED DIAGNOSES  Final diagnoses:  Hives  Sore throat     MEDICATIONS GIVEN DURING THIS VISIT:  Medications  sodium chloride 0.9 % bolus 1,000 mL (0 mLs Intravenous Stopped 11/24/17 1030)  famotidine (PEPCID) IVPB 20 mg premix (0 mg Intravenous Stopped 11/24/17 0921)  hydrOXYzine (ATARAX/VISTARIL) tablet 10 mg (10 mg Oral Given 11/24/17 0852)     NEW OUTPATIENT MEDICATIONS STARTED DURING THIS VISIT:  Discharge Medication List as of 11/24/2017 10:15 AM    START taking these medications   Details  EPINEPHrine (EPIPEN 2-PAK) 0.3 mg/0.3 mL IJ SOAJ injection Inject 0.3 mLs (0.3 mg total)  into the muscle once for 1 dose., Starting Sat 11/24/2017, Print    famotidine (PEPCID) 40 MG tablet Take 1 tablet (40 mg total) by mouth daily for 7 days., Starting Sat 11/24/2017, Until Sat 12/01/2017, Print    hydrOXYzine (ATARAX/VISTARIL) 25 MG tablet Take 1 tablet (25 mg total) by mouth every 6 (six) hours as needed for itching., Starting Sat 11/24/2017, Print    oxyCODONE-acetaminophen (PERCOCET/ROXICET) 5-325 MG tablet Take 1 tablet by mouth every 6 (six) hours as needed for severe pain., Starting Sat 11/24/2017, Print        Note:  This document was prepared using Dragon voice recognition software and may include unintentional dictation errors.  Nanda Quinton, MD Emergency Medicine    Shanara Schnieders, [facility], MD 11/24/17 1346

## 2017-11-24 NOTE — ED Triage Notes (Signed)
Patient here from home with complaints of allergic reaction. States that she had surgery 2 days ago for gallbladder removal. Started having hives and itchy throat last night. States that she feels like her eyes are closing.

## 2017-11-24 NOTE — Discharge Instructions (Signed)
You have been seen in the Emergency Department (ED) today for an allergic reaction.  You have been stable throughout your stay in the Emergency Department.  Please take your medications as prescribed and follow up with your doctor as indicated.  You can take Atarax as directed for itching.   Please keep your Epi-Pen with you at all times and use it if experience shortness of breath or difficulty breathing or if you believe you are having a severe allergic reaction.  If you use the Epi-Pen, though, please call [phone] afterwards or go immediately to your nearest Emergency Department.  During surgery it appears that you were sedated with Propofol and given Zofran for nausea and vomiting. I am also changing your Hydrocodone prescription to an Oxycodone prescription. Follow up with your general surgeon as scheduled.   Return to the Emergency Department (ED) if you experience any worsening or new symptoms that concern you.

## 2017-11-24 NOTE — ED Notes (Signed)
Pt unhooked to go to restroom. Pt ambulated without assistance to restroom.

## 2017-11-24 NOTE — ED Notes (Signed)
Pt reporting nausea. Pt reports that the nausea has decreased and she would like to wait on any medication administration at this time. Pt informed to make nurse aware if nausea returns or persists. Pt verbalizes understanding.

## 2017-11-29 DIAGNOSIS — R21 Rash and other nonspecific skin eruption: Secondary | ICD-10-CM | POA: Diagnosis not present

## 2017-11-29 DIAGNOSIS — L509 Urticaria, unspecified: Secondary | ICD-10-CM | POA: Diagnosis not present

## 2017-12-01 DIAGNOSIS — R21 Rash and other nonspecific skin eruption: Secondary | ICD-10-CM | POA: Diagnosis not present

## 2017-12-13 DIAGNOSIS — R3129 Other microscopic hematuria: Secondary | ICD-10-CM | POA: Diagnosis not present

## 2017-12-13 DIAGNOSIS — R945 Abnormal results of liver function studies: Secondary | ICD-10-CM | POA: Diagnosis not present

## 2018-01-25 DIAGNOSIS — D225 Melanocytic nevi of trunk: Secondary | ICD-10-CM | POA: Diagnosis not present

## 2018-01-25 DIAGNOSIS — L218 Other seborrheic dermatitis: Secondary | ICD-10-CM | POA: Diagnosis not present

## 2018-01-25 DIAGNOSIS — D2239 Melanocytic nevi of other parts of face: Secondary | ICD-10-CM | POA: Diagnosis not present

## 2018-01-25 DIAGNOSIS — D485 Neoplasm of uncertain behavior of skin: Secondary | ICD-10-CM | POA: Diagnosis not present

## 2018-01-30 DIAGNOSIS — K9189 Other postprocedural complications and disorders of digestive system: Secondary | ICD-10-CM | POA: Diagnosis not present

## 2018-03-01 DIAGNOSIS — R509 Fever, unspecified: Secondary | ICD-10-CM | POA: Diagnosis not present

## 2018-03-01 DIAGNOSIS — Z8709 Personal history of other diseases of the respiratory system: Secondary | ICD-10-CM | POA: Diagnosis not present

## 2018-03-01 DIAGNOSIS — J209 Acute bronchitis, unspecified: Secondary | ICD-10-CM | POA: Diagnosis not present

## 2018-03-04 DIAGNOSIS — J209 Acute bronchitis, unspecified: Secondary | ICD-10-CM | POA: Diagnosis not present

## 2018-03-06 DIAGNOSIS — J209 Acute bronchitis, unspecified: Secondary | ICD-10-CM | POA: Diagnosis not present

## 2018-03-13 ENCOUNTER — Other Ambulatory Visit: Payer: Self-pay | Admitting: Internal Medicine

## 2018-03-13 DIAGNOSIS — K582 Mixed irritable bowel syndrome: Secondary | ICD-10-CM | POA: Diagnosis not present

## 2018-03-13 DIAGNOSIS — J209 Acute bronchitis, unspecified: Secondary | ICD-10-CM | POA: Diagnosis not present

## 2018-03-13 DIAGNOSIS — K9089 Other intestinal malabsorption: Secondary | ICD-10-CM | POA: Diagnosis not present

## 2018-03-14 ENCOUNTER — Ambulatory Visit
Admission: RE | Admit: 2018-03-14 | Discharge: 2018-03-14 | Disposition: A | Discharge status: Home / Self Care | Payer: 59 | Source: Ambulatory Visit | Attending: Internal Medicine | Admitting: Internal Medicine

## 2018-03-14 ENCOUNTER — Other Ambulatory Visit: Payer: Self-pay | Admitting: Internal Medicine

## 2018-03-14 DIAGNOSIS — J209 Acute bronchitis, unspecified: Secondary | ICD-10-CM

## 2018-03-14 DIAGNOSIS — R05 Cough: Secondary | ICD-10-CM | POA: Diagnosis not present

## 2018-03-14 DIAGNOSIS — R911 Solitary pulmonary nodule: Secondary | ICD-10-CM | POA: Diagnosis not present

## 2018-03-14 IMAGING — CR DG CHEST 2V
2 series · 2 of 2 positions shown · non-contrast
Comparison: None.

CLINICAL DATA: Cough and shortness of breath

EXAM:
CHEST - 2 VIEW

[w chest pa]
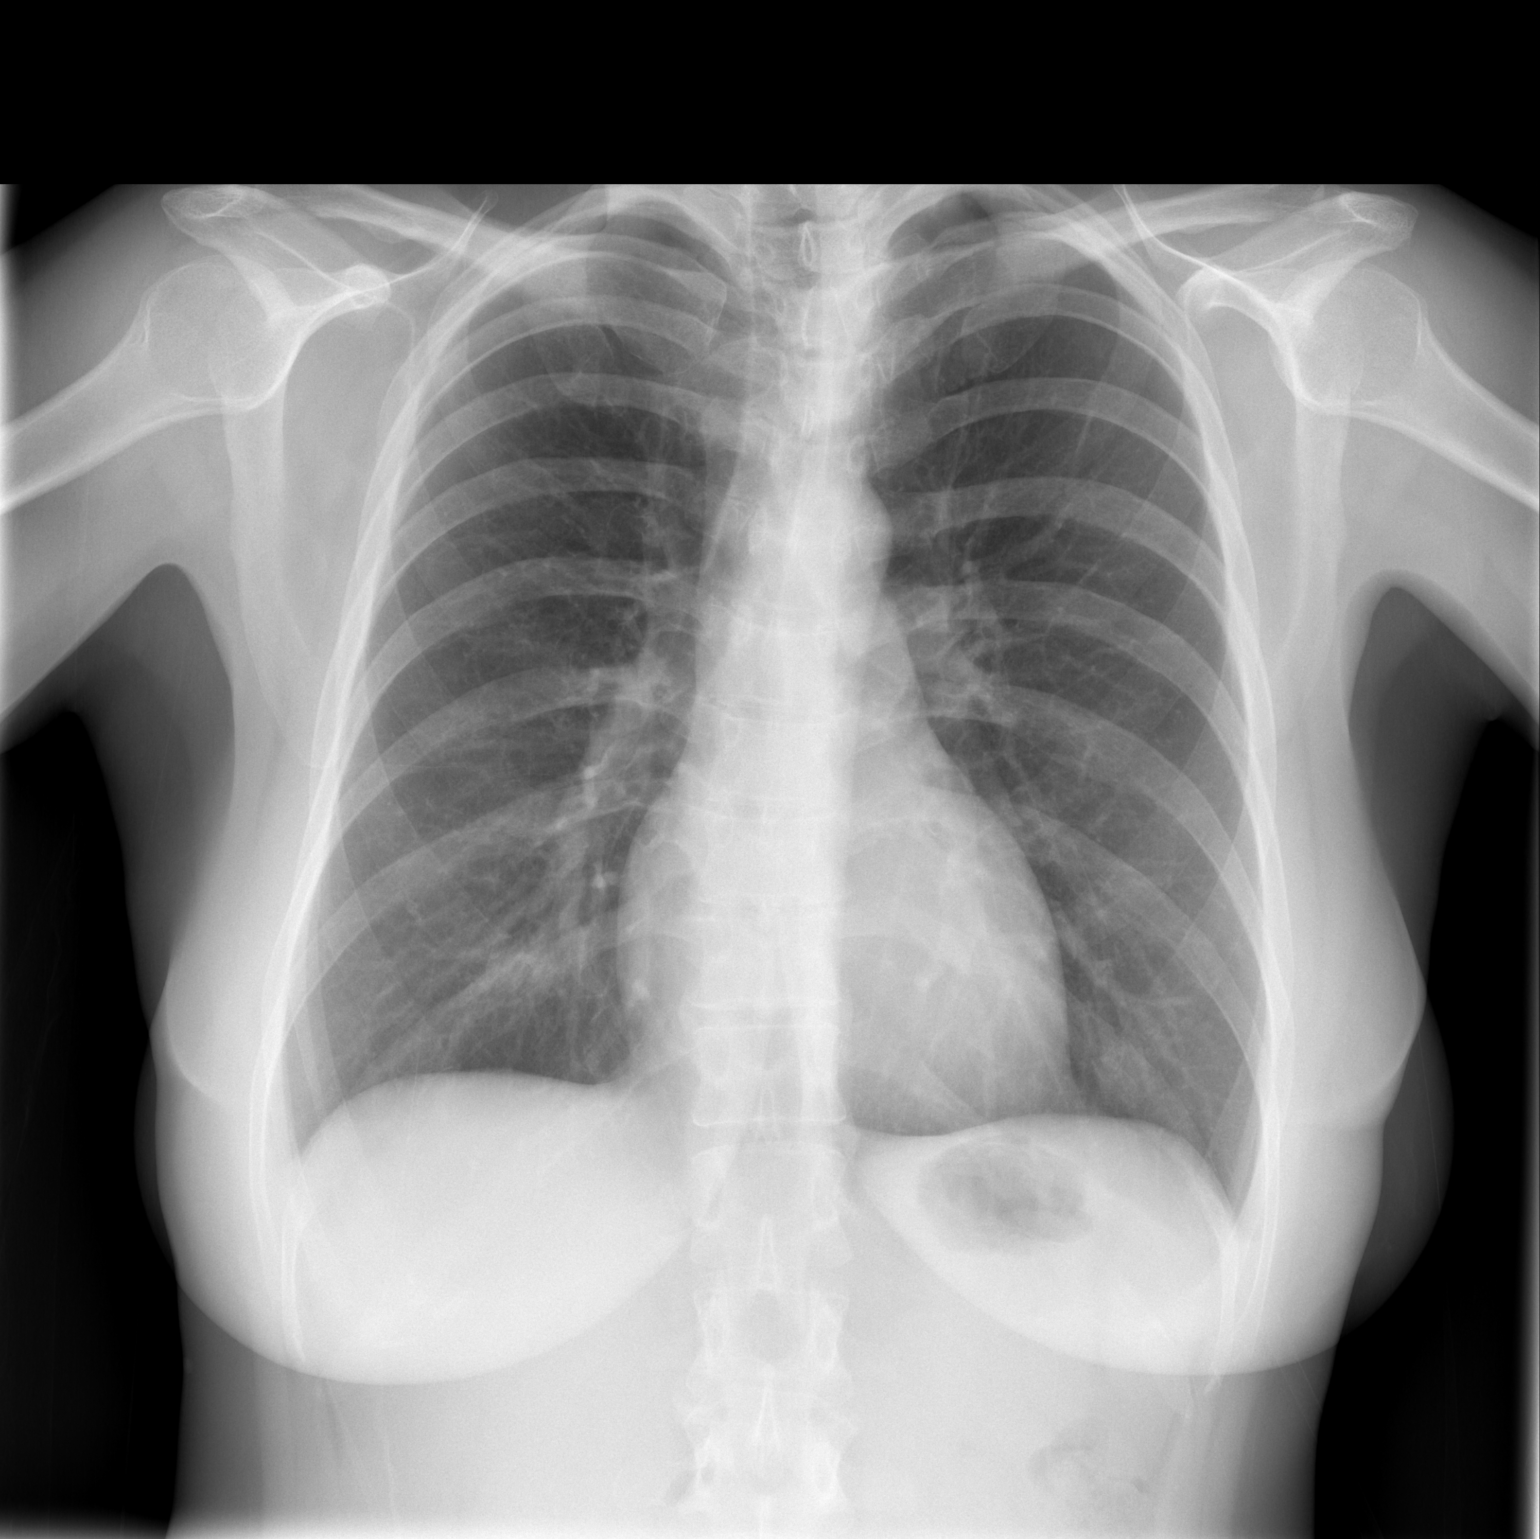

[w chest lat]
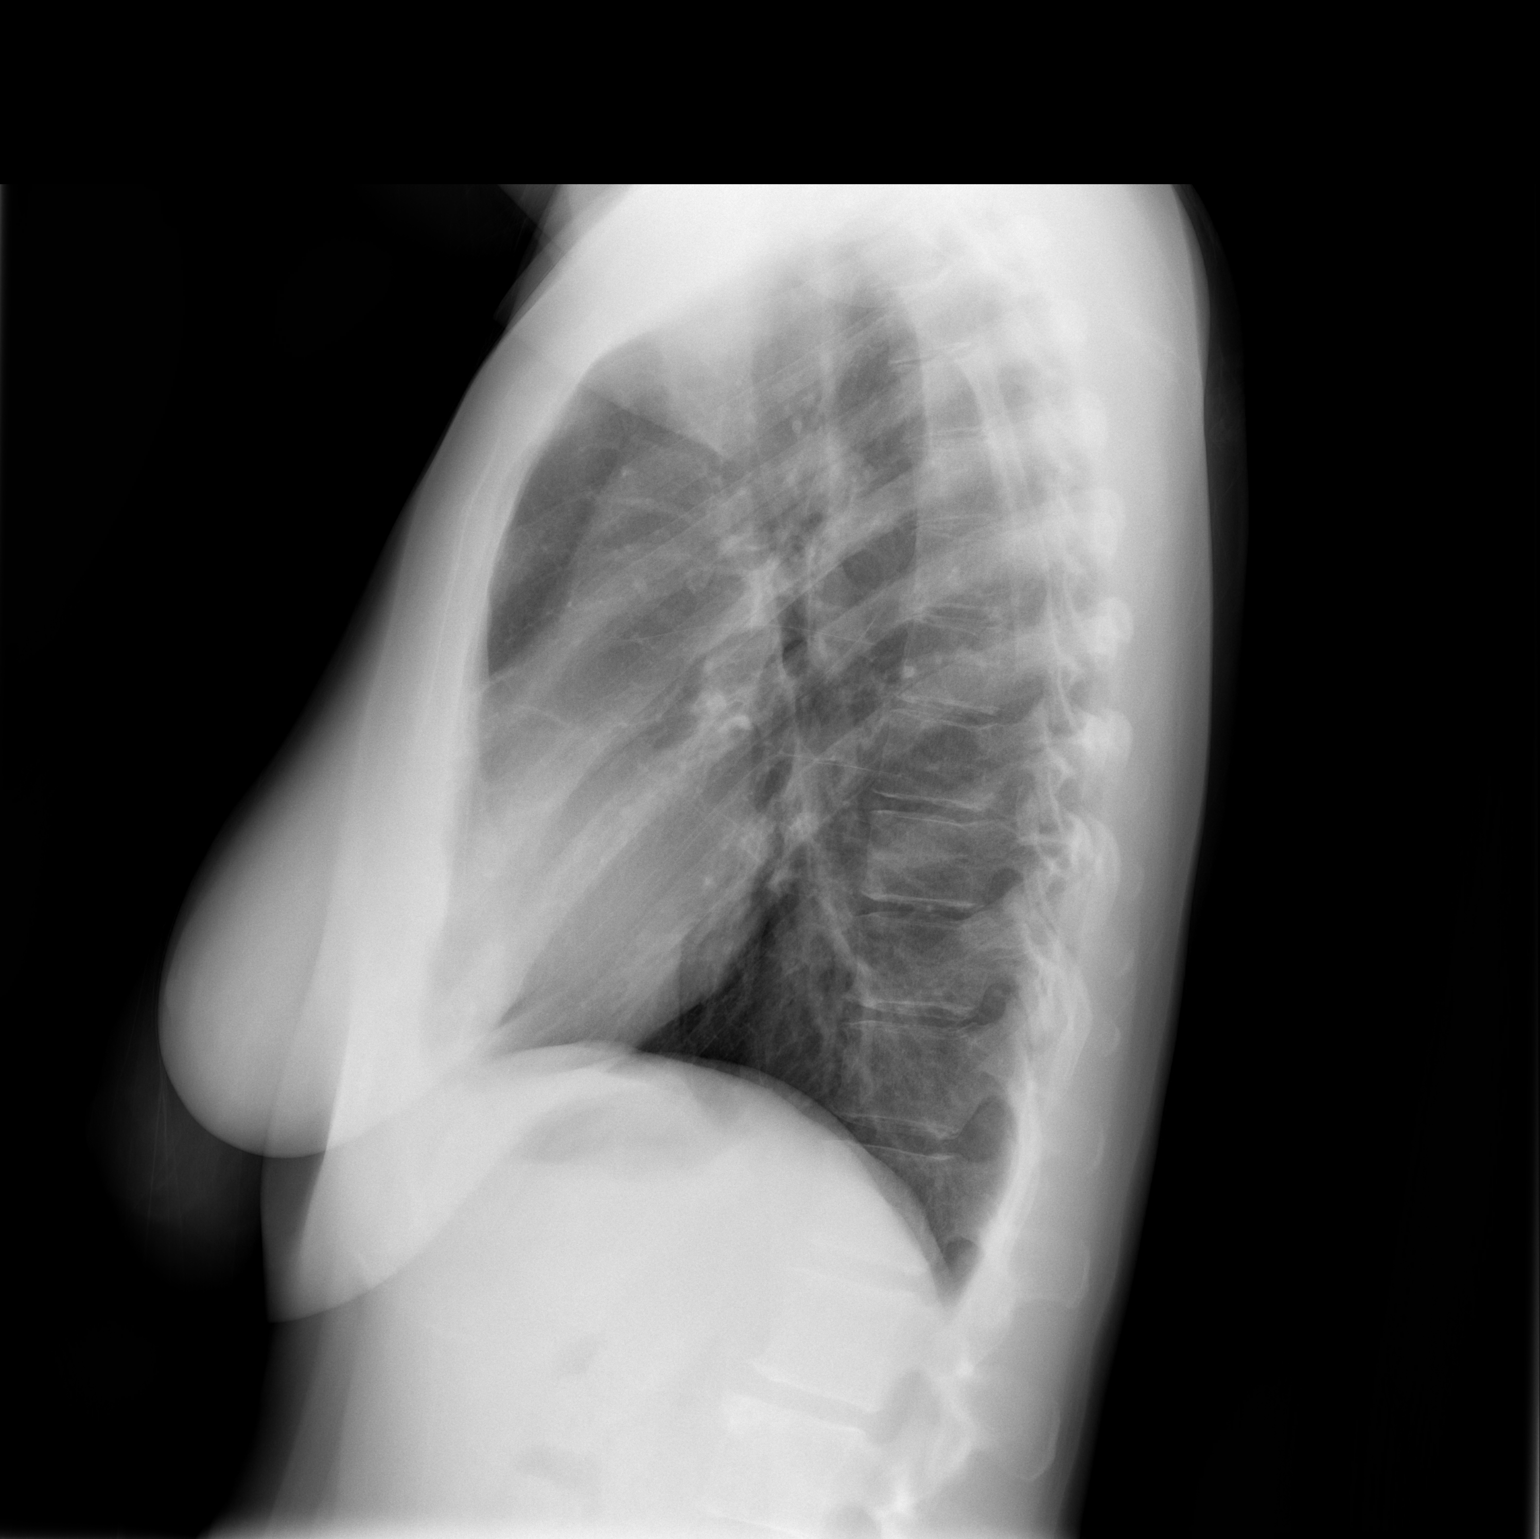

[2 of 2 positions shown; findings below may reference images not displayed]

FINDINGS: Lungs are clear. Heart size and pulmonary vascularity are normal. No
adenopathy. No evident bone lesions.
IMPRESSION: No edema or consolidation.

## 2018-03-14 IMAGING — CT CT CHEST W/ CM
2 of 4 series · 10 of 36 positions shown, 12 images · IV contrast (iopamidol)
Comparison: [DATE] abdominal ultrasound.

CLINICAL DATA: 42 y/o F; 3 weeks of cough finishing antibiotics and
steroids.

EXAM:
CT CHEST WITH CONTRAST
TECHNIQUE: Multidetector CT imaging of the chest was performed during
intravenous contrast administration.
CONTRAST:  75mL [A5] IOPAMIDOL ([A5]) INJECTION 61%

[Series 2: chest 2.00 br40 s3 ax · axial · 0.44mm/px · z∈[+1471,+1751]mm · 7 of 166 slices shown, 9 images]
[im 13/166  mediastinal]
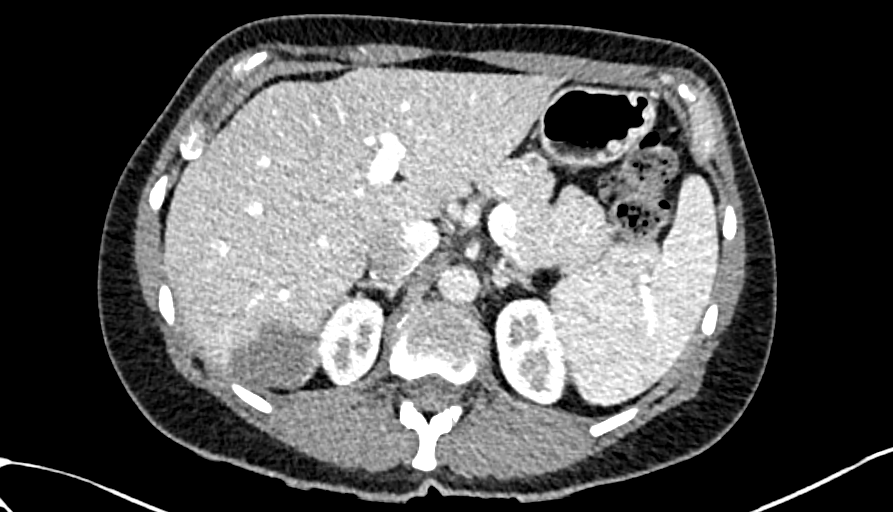
[im 13/166  lung]
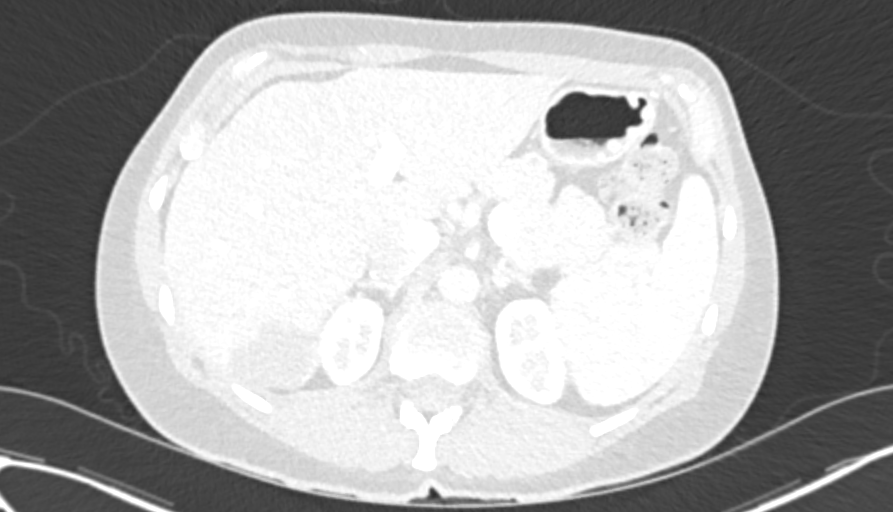
[im 39/166  lung]
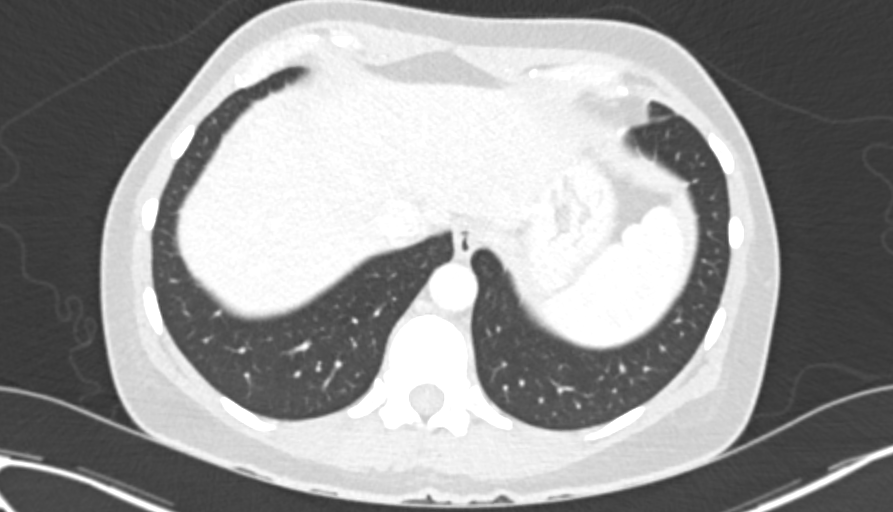
[im 64/166  lung]
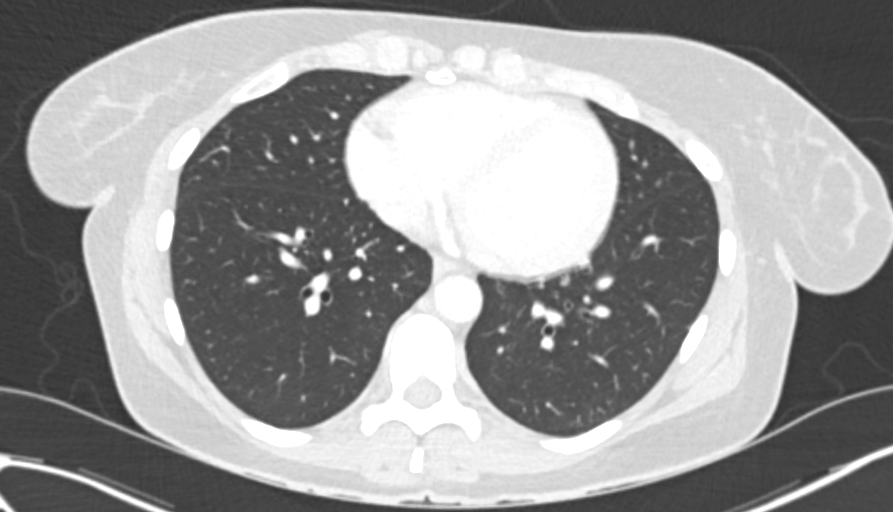
[im 89/166  lung]
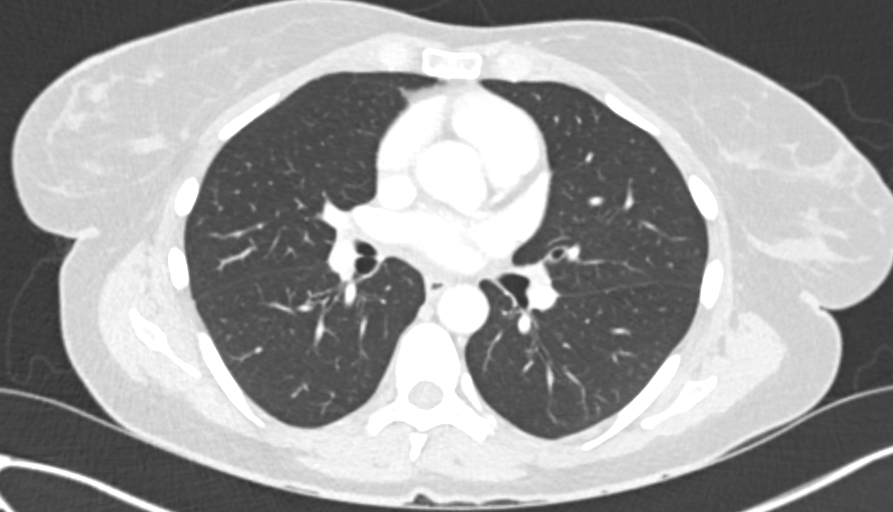
[im 102/166  mediastinal]
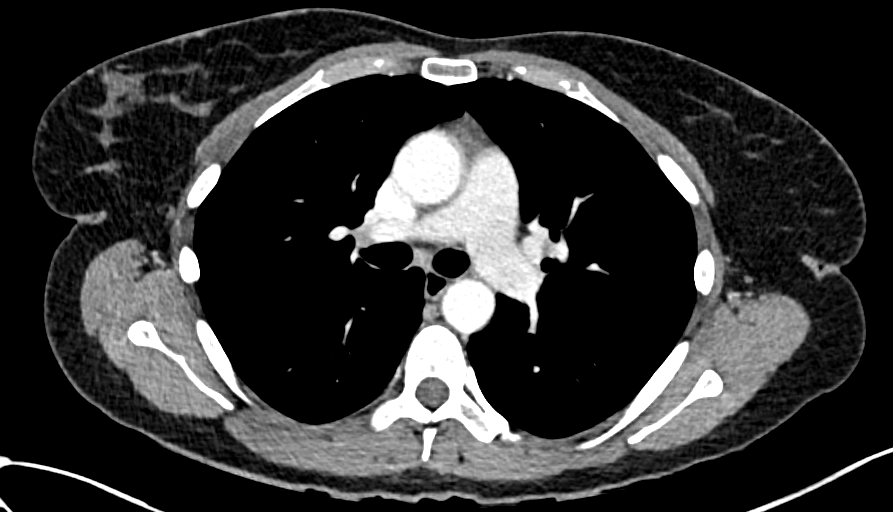
[im 102/166  lung]
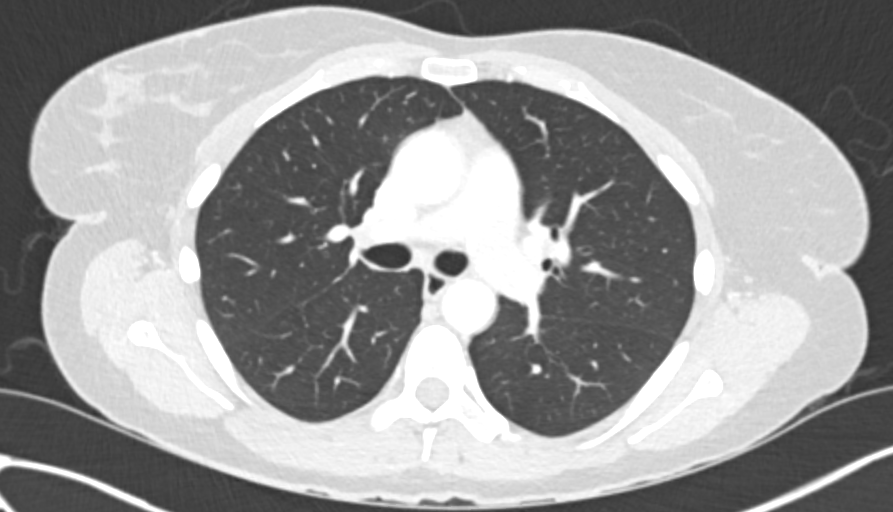
[im 127/166  lung]
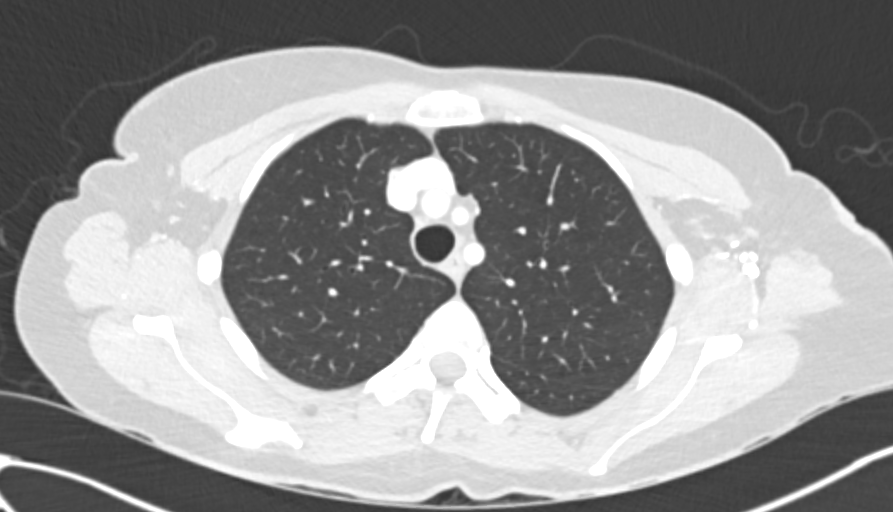
[im 153/166  lung]
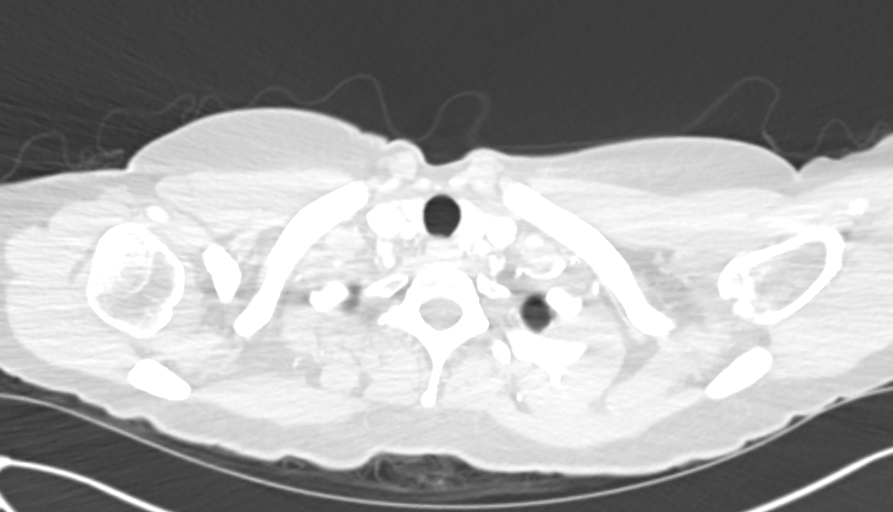

[Series 4: chest 2.00 br40 s3 cor · coronal · 0.65mm/px · 3 of 113 slices shown]
[im 23/113  lung]
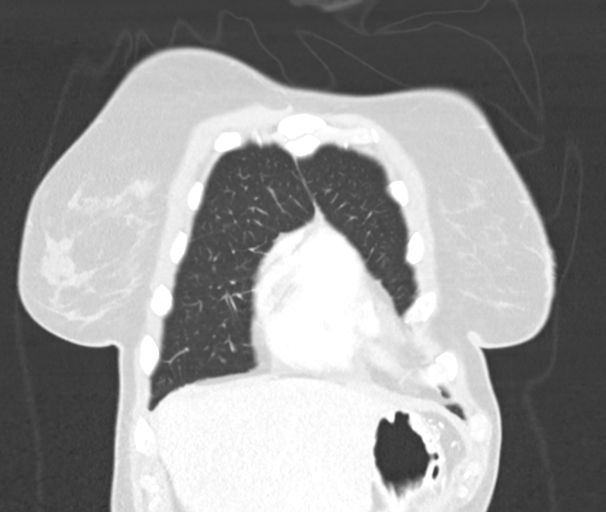
[im 45/113  lung]
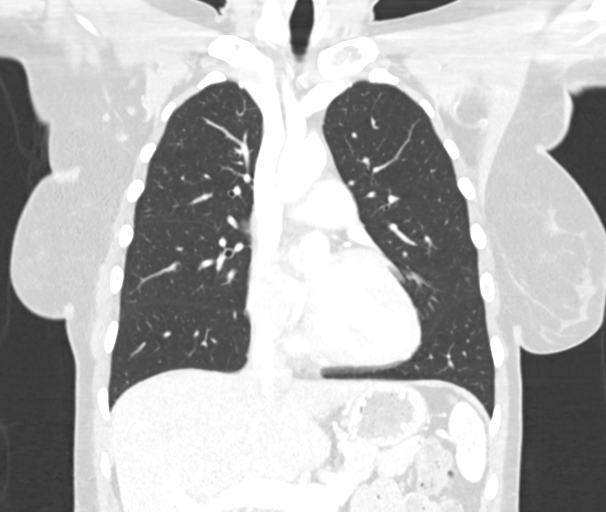
[im 68/113  lung]
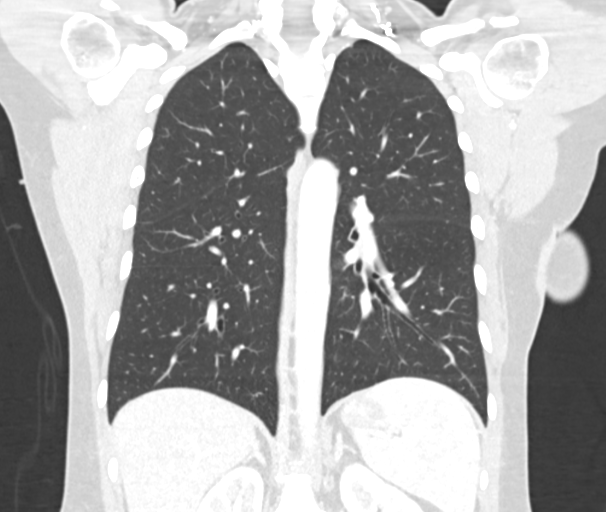

[10 of 36 positions shown; findings below may reference images not displayed]

FINDINGS: Cardiovascular: No significant vascular findings. Normal heart size.
No pericardial effusion.

Mediastinum/Nodes: No enlarged mediastinal, hilar, or axillary lymph
nodes. Thyroid gland, trachea, and esophagus demonstrate no
significant findings.

Lungs/Pleura: 3 mm perifissural nodule in the right middle lobe
(series 8, image 102) and at the periphery of left lower lobe
(series 8, image 104) compatible with intrapulmonary lymph nodes. No
consolidation, effusion, or pneumothorax.

Upper Abdomen: Multiple hypodense liver lesions are present the
largest measuring 4.1 cm in the right lobe of liver (series 2, image
156). Findings correspond to hyperechoic lesions on prior ultrasound
are grossly stable given differences in technique.

Musculoskeletal: No chest wall abnormality. No acute or significant
osseous findings.
IMPRESSION: 1. No acute pulmonary process.
2. Multiple nonspecific hypodense liver lesions grossly stable from
prior ultrasound given differences in technique, possibly
representing hemangioma. These can be further characterized with
liver protocol CT or MRI of the abdomen.

By: CHANDRESH M.D.

## 2018-03-14 MED ORDER — IOPAMIDOL (ISOVUE-300) INJECTION 61%
75.0000 mL | Freq: Once | INTRAVENOUS | Status: AC | PRN
  Administered 2018-03-14: 75 mL via INTRAVENOUS

## 2018-04-11 DIAGNOSIS — K9089 Other intestinal malabsorption: Secondary | ICD-10-CM | POA: Diagnosis not present

## 2018-04-11 DIAGNOSIS — K582 Mixed irritable bowel syndrome: Secondary | ICD-10-CM | POA: Diagnosis not present

## 2018-04-11 DIAGNOSIS — K210 Gastro-esophageal reflux disease with esophagitis: Secondary | ICD-10-CM | POA: Diagnosis not present

## 2018-05-02 DIAGNOSIS — K582 Mixed irritable bowel syndrome: Secondary | ICD-10-CM | POA: Diagnosis not present

## 2018-05-02 DIAGNOSIS — K210 Gastro-esophageal reflux disease with esophagitis: Secondary | ICD-10-CM | POA: Diagnosis not present

## 2018-05-02 DIAGNOSIS — R197 Diarrhea, unspecified: Secondary | ICD-10-CM | POA: Diagnosis not present

## 2018-05-09 DIAGNOSIS — H4912 Fourth [trochlear] nerve palsy, left eye: Secondary | ICD-10-CM | POA: Diagnosis not present

## 2018-05-09 DIAGNOSIS — H5213 Myopia, bilateral: Secondary | ICD-10-CM | POA: Diagnosis not present

## 2018-05-22 ENCOUNTER — Other Ambulatory Visit: Payer: Self-pay | Admitting: General Surgery

## 2018-05-22 DIAGNOSIS — K8010 Calculus of gallbladder with chronic cholecystitis without obstruction: Secondary | ICD-10-CM

## 2018-05-22 DIAGNOSIS — R160 Hepatomegaly, not elsewhere classified: Secondary | ICD-10-CM

## 2018-05-30 ENCOUNTER — Ambulatory Visit
Admission: RE | Admit: 2018-05-30 | Discharge: 2018-05-30 | Disposition: A | Discharge status: Home / Self Care | Payer: 59 | Source: Ambulatory Visit | Attending: General Surgery | Admitting: General Surgery

## 2018-05-30 DIAGNOSIS — K8010 Calculus of gallbladder with chronic cholecystitis without obstruction: Secondary | ICD-10-CM

## 2018-05-30 DIAGNOSIS — R160 Hepatomegaly, not elsewhere classified: Secondary | ICD-10-CM

## 2018-05-30 DIAGNOSIS — K7689 Other specified diseases of liver: Secondary | ICD-10-CM | POA: Diagnosis not present

## 2018-06-27 DIAGNOSIS — H4912 Fourth [trochlear] nerve palsy, left eye: Secondary | ICD-10-CM | POA: Diagnosis not present

## 2018-07-04 ENCOUNTER — Ambulatory Visit: Payer: Self-pay | Admitting: Ophthalmology

## 2018-07-12 ENCOUNTER — Encounter (HOSPITAL_BASED_OUTPATIENT_CLINIC_OR_DEPARTMENT_OTHER): Payer: Self-pay

## 2018-07-12 ENCOUNTER — Ambulatory Visit (HOSPITAL_BASED_OUTPATIENT_CLINIC_OR_DEPARTMENT_OTHER): Admit: 2018-07-12 | Payer: 59 | Admitting: Ophthalmology

## 2018-07-12 SURGERY — REPAIR STRABISMUS
Anesthesia: General | Laterality: Left

## 2018-09-06 IMAGING — US US ABDOMEN LIMITED
1 series · 13 of 25 positions shown · non-contrast
Comparison: None.

CLINICAL DATA: Upper abdominal pain

EXAM:
ULTRASOUND ABDOMEN LIMITED RIGHT UPPER QUADRANT

[Series 1: us abdomen limited · 0.20mm/px · 13 of 62 slices shown]
[im 1/62]
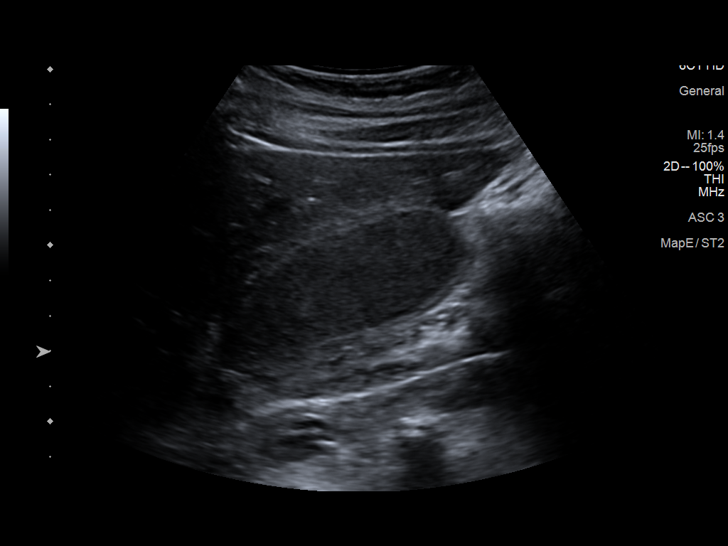
[im 6/62]
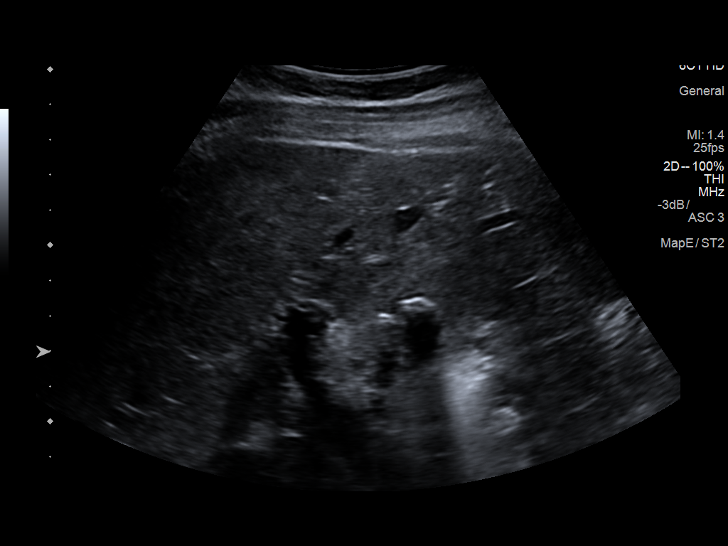
[im 11/62]
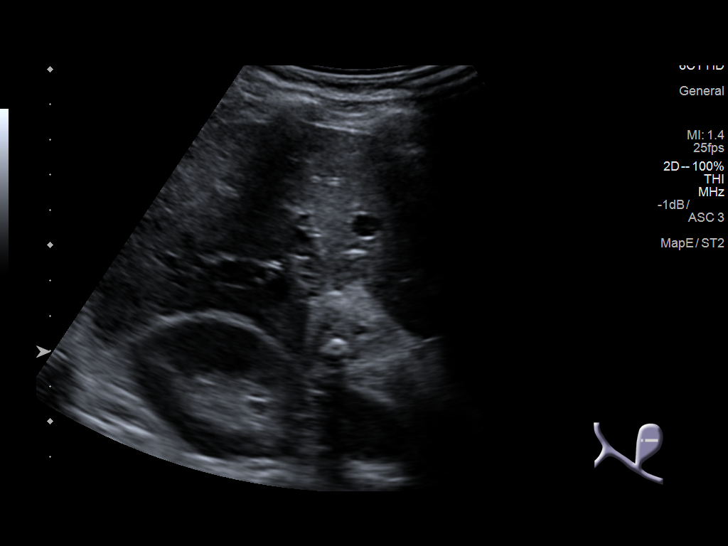
[im 16/62]
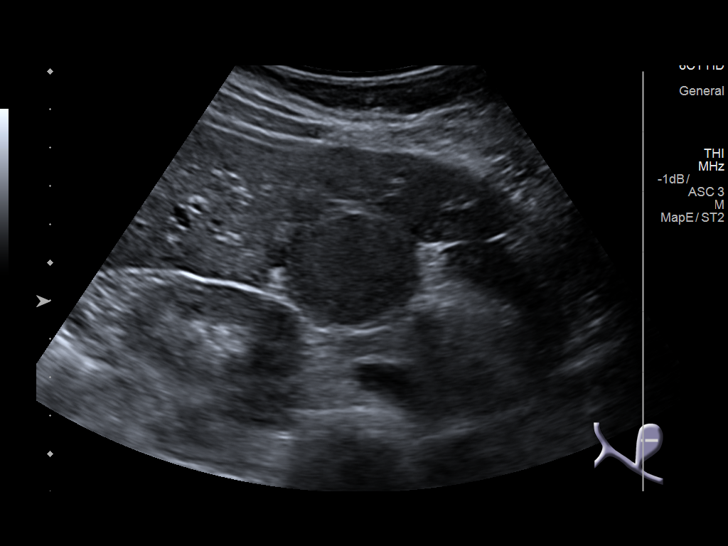
[im 21/62]
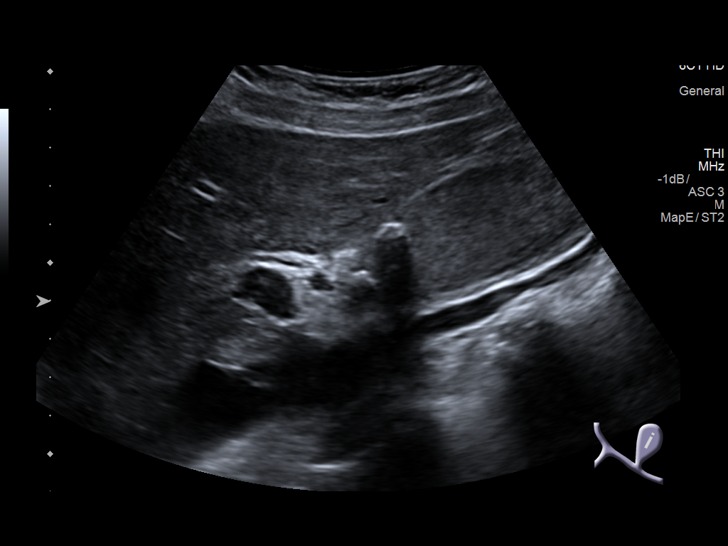
[im 26/62]
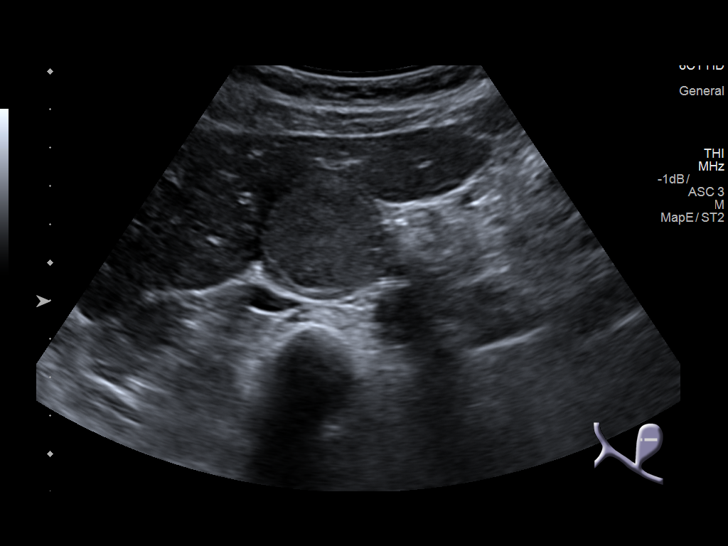
[im 31/62]
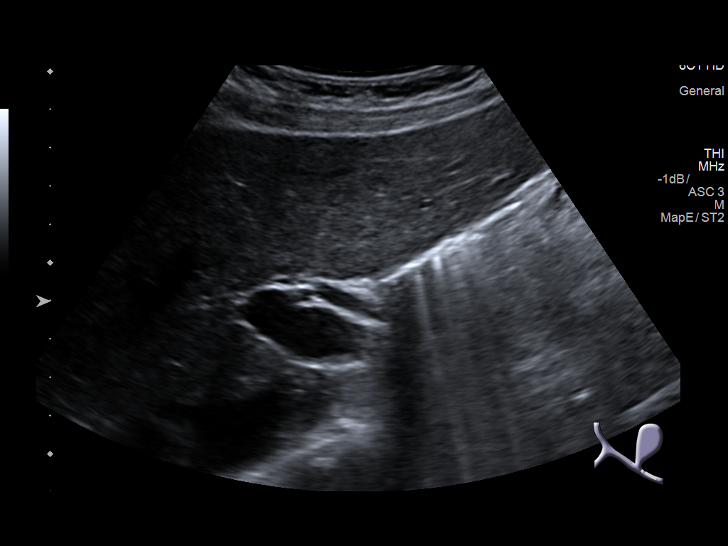
[im 36/62]
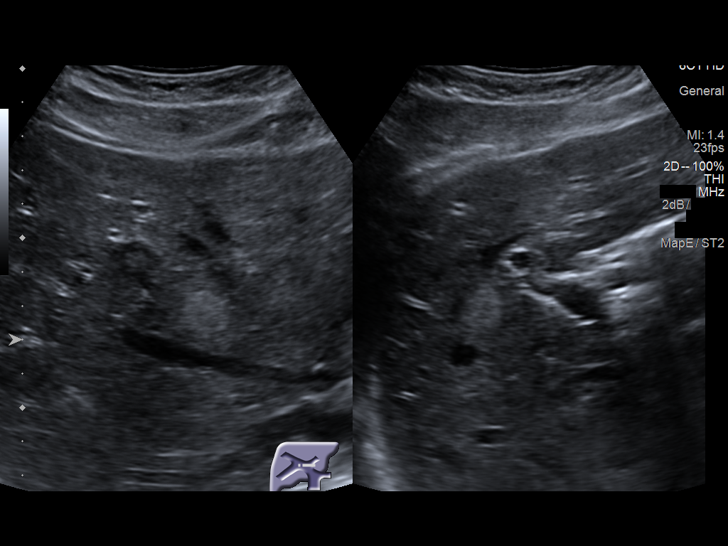
[im 41/62]
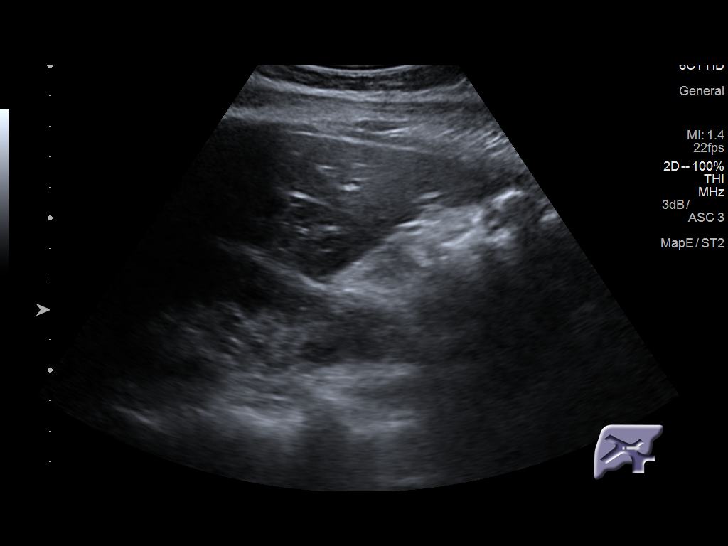
[im 46/62]
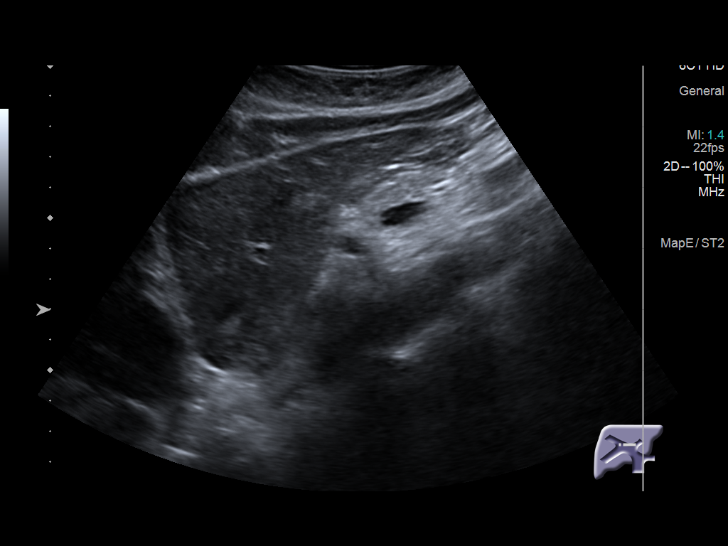
[im 51/62]
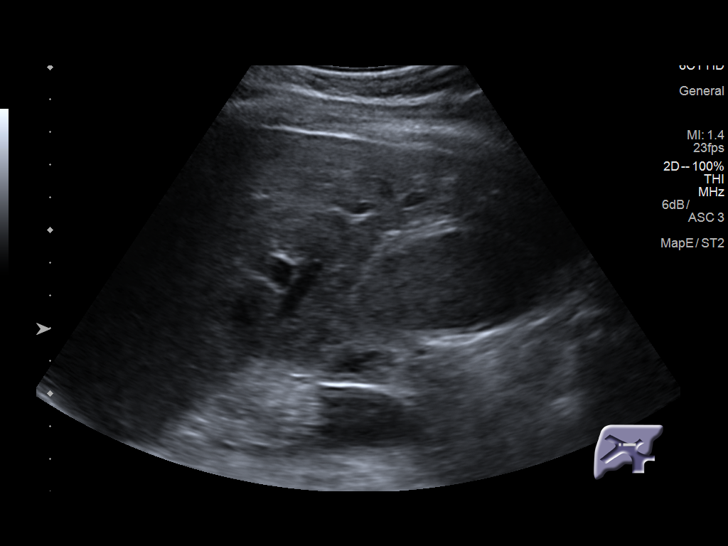
[im 56/62]
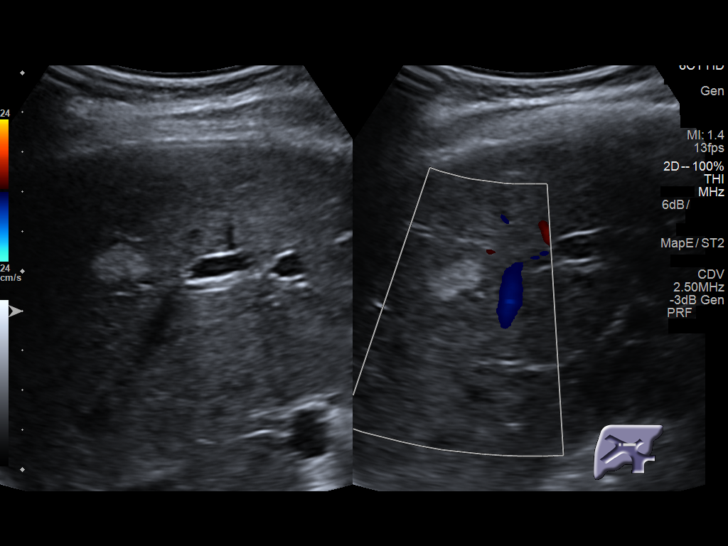
[im 62/62]
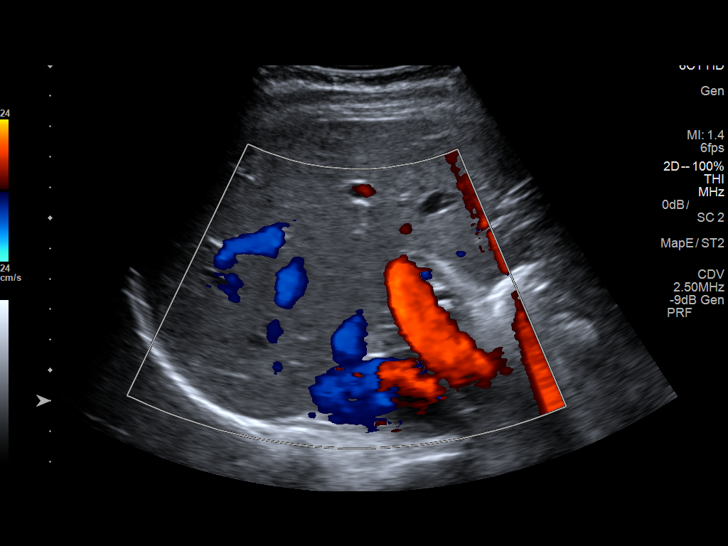

[13 of 25 positions shown; findings below may reference images not displayed]

FINDINGS: Gallbladder:

Within the gallbladder, there are echogenic foci which move and
shadow consistent with cholelithiasis. Largest gallstone measures
1.3 cm in length. There is extensive sludge in the gallbladder.
There is no gallbladder wall thickening or pericholecystic fluid. No
sonographic Murphy sign noted by sonographer.

Common bile duct:

Diameter: 6 mm. No intrahepatic or extrahepatic biliary duct
dilatation.

Liver:

There is a uniformly echogenic focus in the anterior segment right
lobe of the liver measuring 1.5 x 1.0 x 1.5 cm. There is a solid
mass with slightly inhomogeneous echotexture at the right
hepatorenal fossa level measuring 4.1 x 3.2 x 4.5 cm. There is a
hyperechoic mass in the left lobe of the liver measuring 1.3 x 1.3 x
1.2 cm. Within normal limits in parenchymal echogenicity. Portal
vein is patent on color Doppler imaging with normal direction of
blood flow towards the liver.
IMPRESSION: 1. Cholelithiasis with diffuse sludge in gallbladder. No gallbladder
wall thickening or pericholecystic fluid. Given the appearance of
the gallbladder, it may be reasonable to consider nuclear medicine
hepatobiliary imaging study to assess for cystic duct patency.

2. Echogenic lesions in the liver, likely hemangiomas. As there are
no prior studies to compare, it would be prudent to consider a
follow-up ultrasound in approximately 1 year to assess for
stability.

3.  No appreciable biliary duct dilatation.

## 2018-09-17 ENCOUNTER — Encounter: Payer: Self-pay | Admitting: Allergy and Immunology

## 2018-09-17 ENCOUNTER — Ambulatory Visit (INDEPENDENT_AMBULATORY_CARE_PROVIDER_SITE_OTHER): Admitting: Allergy and Immunology

## 2018-09-17 VITALS — BP 118/70 | HR 93 | Temp 98.4°F | Resp 17 | Ht 63.39 in | Wt 170.2 lb

## 2018-09-17 DIAGNOSIS — J019 Acute sinusitis, unspecified: Secondary | ICD-10-CM | POA: Insufficient documentation

## 2018-09-17 DIAGNOSIS — J0100 Acute maxillary sinusitis, unspecified: Secondary | ICD-10-CM

## 2018-09-17 DIAGNOSIS — J45901 Unspecified asthma with (acute) exacerbation: Secondary | ICD-10-CM | POA: Diagnosis not present

## 2018-09-17 DIAGNOSIS — J3089 Other allergic rhinitis: Secondary | ICD-10-CM

## 2018-09-17 DIAGNOSIS — J0110 Acute frontal sinusitis, unspecified: Secondary | ICD-10-CM

## 2018-09-17 HISTORY — DX: Acute frontal sinusitis, unspecified: J01.10

## 2018-09-17 HISTORY — DX: Other allergic rhinitis: J30.89

## 2018-09-17 MED ORDER — MONTELUKAST SODIUM 10 MG PO TABS: 10.0000 mg | ORAL_TABLET | Freq: Every day | ORAL | 5 refills | Status: DC

## 2018-09-17 MED ORDER — CARBINOXAMINE MALEATE 4 MG PO TABS: 4.0000 mg | ORAL_TABLET | Freq: Four times a day (QID) | ORAL | 5 refills | Status: DC | PRN

## 2018-09-17 MED ORDER — FLUTICASONE PROPIONATE HFA 110 MCG/ACT IN AERO: 2.0000 | INHALATION_SPRAY | Freq: Two times a day (BID) | RESPIRATORY_TRACT | 5 refills | Status: DC

## 2018-09-17 MED ORDER — FLUTICASONE PROPIONATE 93 MCG/ACT NA EXHU: 2.0000 | INHALANT_SUSPENSION | Freq: Two times a day (BID) | NASAL | 5 refills | Status: DC

## 2018-09-17 NOTE — Assessment & Plan Note (Signed)
   Xhance, montelukast, and carbinoxamine have been prescribed (as above).  If allergen avoidance measures and medications fail to adequately relieve symptoms, aeroallergen immunotherapy will be considered.

## 2018-09-17 NOTE — Assessment & Plan Note (Signed)
   Prednisone has been provided (as above).  A prescription has been provided for [facility], 2 actuations per nostril twice a day. Proper technique has been discussed and demonstrated.  Nasal saline lavage (NeilMed) has been recommended as needed and prior to medicated nasal sprays along with instructions for proper administration.  A prescription has been provided for carbinoxamine 4 mg every 6-8 hours if needed.

## 2018-09-17 NOTE — Patient Instructions (Addendum)
Asthma with acute exacerbation With exacerbation due to aeroallergen exposure.  Prednisone has been provided, 40 mg x3 days, 20 mg x1 day, 10 mg x1 day, then stop.  A prescription has been provided for Flovent (fluticasone) 110 g,  2 inhalations twice a day. To maximize pulmonary deposition, a spacer has been provided along with instructions for its proper administration with an HFA inhaler.  A prescription has been provided for montelukast 10 mg daily at bedtime.  Continue albuterol HFA, 1 to 2 inhalations every 4-6 hours if needed.  Subjective and objective measures of pulmonary function will be followed and the treatment plan will be adjusted accordingly.  Acute sinusitis  Prednisone has been provided (as above).  A prescription has been provided for [facility], 2 actuations per nostril twice a day. Proper technique has been discussed and demonstrated.  Nasal saline lavage (NeilMed) has been recommended as needed and prior to medicated nasal sprays along with instructions for proper administration.  A prescription has been provided for carbinoxamine 4 mg every 6-8 hours if needed.  Other allergic rhinitis  Xhance, montelukast, and carbinoxamine have been prescribed (as above).  If allergen avoidance measures and medications fail to adequately relieve symptoms, aeroallergen immunotherapy will be considered.   Return in about 4 weeks (around 10/15/2018), or if symptoms worsen or fail to improve.

## 2018-09-17 NOTE — Progress Notes (Signed)
New Patient Note  RE: Anne Jefferson MRN: [id number] DOB: 1976-06-09 Date of Office Visit: 09/17/2018  Referring provider: No ref. provider found Primary care provider: Josetta Huddle, MD  Chief Complaint: Asthma; Sinus Problem; and Allergic Rhinitis    History of present illness: Anne Jefferson is a 43 y.o. female presenting today for evaluation of rhinoconjunctivitis. She reports that approximately 6 weeks ago she introduced a cat into her home.  Shortly thereafter, she began to experience thick postnasal drainage, "constant" globus sensation, coughing, wheezing, nasal congestion, sinus pressure, and fatigue.  She has not experienced fevers, chills, or discolored mucus production.  She got rid of the cat 2 weeks ago and has increased her use of Nasonex from once daily to twice daily.  Despite these changes, her upper and lower respiratory symptoms have persisted.  She was allergy tested in the past with reactivity to cats, dogs, cockroach, and dust.  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  She has been using her albuterol more frequently recently with benefit.  She apparently has a prescription for an inhaled corticosteroid but has not been using it.  She has received prednisone in the past with benefit for asthma exacerbations and sinus infections.  Assessment and plan: Asthma with acute exacerbation With exacerbation due to aeroallergen exposure.  Prednisone has been provided, 40 mg x3 days, 20 mg x1 day, 10 mg x1 day, then stop.  A prescription has been provided for Flovent (fluticasone) 110 g,  2 inhalations twice a day. To maximize pulmonary deposition, a spacer has been provided along with instructions for its proper administration with an HFA inhaler.  A prescription has been provided for montelukast 10 mg daily at bedtime.  Continue albuterol HFA, 1 to 2 inhalations every 4-6 hours if needed.  Subjective and  objective measures of pulmonary function will be followed and the treatment plan will be adjusted accordingly.  Acute sinusitis  Prednisone has been provided (as above).  A prescription has been provided for [facility], 2 actuations per nostril twice a day. Proper technique has been discussed and demonstrated.  Nasal saline lavage (NeilMed) has been recommended as needed and prior to medicated nasal sprays along with instructions for proper administration.  A prescription has been provided for carbinoxamine 4 mg every 6-8 hours if needed.  Other allergic rhinitis  Xhance, montelukast, and carbinoxamine have been prescribed (as above).  If allergen avoidance measures and medications fail to adequately relieve symptoms, aeroallergen immunotherapy will be considered.   Meds ordered this encounter  Medications  . montelukast (SINGULAIR) 10 MG tablet    Sig: Take 1 tablet (10 mg total) by mouth at bedtime.    Dispense:  30 tablet    Refill:  5  . fluticasone (FLOVENT HFA) 110 MCG/ACT inhaler    Sig: Inhale 2 puffs into the lungs 2 (two) times daily.    Dispense:  1 Inhaler    Refill:  5  . Fluticasone Propionate (XHANCE) 93 MCG/ACT EXHU    Sig: Place 2 sprays into the nose 2 (two) times daily.    Dispense:  32 mL    Refill:  5    [phone] (M)  . Carbinoxamine Maleate 4 MG TABS    Sig: Take 1 tablet (4 mg total) by mouth every 6 (six) hours as needed.    Dispense:  90 tablet    Refill:  5    Diagnostics: Spirometry: FVC was 2.83 L and FEV1  was 2.41 L (86% predicted) without postbronchodilator improvement.  This study was performed while the patient was asymptomatic.  Please see scanned spirometry results for details. Allergy skin testing: Deferred per patient request.    Physical examination: Blood pressure 118/70, pulse 93, temperature 98.4 F (36.9 C), temperature source Oral, resp. rate 17, height 5' 3.39" (1.61 m), weight 170 lb 4 oz (77.2 kg), SpO2 96 %.  General:  Alert, interactive, in no acute distress. HEENT: TMs pearly gray, turbinates edematous with thick discharge, post-pharynx erythematous. Neck: Supple without lymphadenopathy. Lungs: Clear to auscultation without wheezing, rhonchi or rales. CV: Normal S1, S2 without murmurs. Abdomen: Nondistended, nontender. Skin: Warm and dry, without lesions or rashes. Extremities:  No clubbing, cyanosis or edema. Neuro:   Grossly intact.  Review of systems:  Review of systems negative except as noted in HPI / PMHx or noted below: Review of Systems  Constitutional: Negative.   HENT: Negative.   Eyes: Negative.   Respiratory: Negative.   Cardiovascular: Negative.   Gastrointestinal: Negative.   Genitourinary: Negative.   Musculoskeletal: Negative.   Skin: Negative.   Neurological: Negative.   Endo/Heme/Allergies: Negative.   Psychiatric/Behavioral: Negative.     Past medical history:  Past Medical History:  Diagnosis Date  . Abnormal Pap smear   . Anxiety    no meds  . Asthma    mild, no issue for years no  inhaler use except in winter  . Biliary colic   . Blood type, Rh negative   . Gallstones   . H/O dysmenorrhea   . Infertility, female   . Nausea and vomiting   . Pain    back and chest realted to gallbladder since november 2018  . PONV (postoperative nausea and vomiting)    severe, needs heavy anti nausea meds    Past surgical history:  Past Surgical History:  Procedure Laterality Date  .  c sections     x 2  3 births last was twins  . CHOLECYSTECTOMY N/A 11/22/2017   Procedure: LAPAROSCOPIC CHOLECYSTECTOMY ERAS PATHWAY;  Surgeon: Kieth Brightly Arta Bruce, MD;  Location: [facility];  Service: General;  Laterality: N/A;  . DILATION AND CURETTAGE OF UTERUS  2011  . LEEP  12/23/2009  . WISDOM TOOTH EXTRACTION      Family history: Family History  Problem Relation Age of Onset  . Other Mother        Varicosities  . Diabetes Mother   . Allergic rhinitis Mother   . Diabetes Maternal  Grandmother   . Skin cancer Maternal Grandmother     Social history: Social History   Socioeconomic History  . Marital status: Married    Spouse name: Not on file  . Number of children: Not on file  . Years of education: Not on file  . Highest education level: Not on file  Occupational History  . Not on file  Social Needs  . Financial resource strain: Not on file  . Food insecurity:    Worry: Not on file    Inability: Not on file  . Transportation needs:    Medical: Not on file    Non-medical: Not on file  Tobacco Use  . Smoking status: Never Smoker  . Smokeless tobacco: Never Used  Substance and Sexual Activity  . Alcohol use: No  . Drug use: No  . Sexual activity: Yes    Birth control/protection: None  Lifestyle  . Physical activity:    Days per week: Not on file  Minutes per session: Not on file  . Stress: Not on file  Relationships  . Social connections:    Talks on phone: Not on file    Gets together: Not on file    Attends religious service: Not on file    Active member of club or organization: Not on file    Attends meetings of clubs or organizations: Not on file    Relationship status: Not on file  . Intimate partner violence:    Fear of current or ex partner: Not on file    Emotionally abused: Not on file    Physically abused: Not on file    Forced sexual activity: Not on file  Other Topics Concern  . Not on file  Social History Narrative  . Not on file   Environmental History: The patient lives in a 45 year old house with carpeting throughout and central air/heat.  There are currently no pets in the home, however there was recently a cat in the home.  There is no known mold/water damage in the home.  She is a non-smoker.  Allergies as of 09/17/2018      Reactions   Promethazine Other (See Comments)   Hallucinations   Theophyllines Nausea And Vomiting      Medication List       Accurate as of September 17, 2018 10:10 AM. Always use your most  recent med list.        Carbinoxamine Maleate 4 MG Tabs Take 1 tablet (4 mg total) by mouth every 6 (six) hours as needed.   fluticasone 110 MCG/ACT inhaler Commonly known as:  FLOVENT HFA Inhale 2 puffs into the lungs 2 (two) times daily.   Fluticasone Propionate 93 MCG/ACT Exhu Commonly known as:  XHANCE Place 2 sprays into the nose 2 (two) times daily.   mometasone 50 MCG/ACT nasal spray Commonly known as:  NASONEX Place 1 spray into the nose daily.   montelukast 10 MG tablet Commonly known as:  SINGULAIR Take 1 tablet (10 mg total) by mouth at bedtime.       Known medication allergies: Allergies  Allergen Reactions  . Promethazine Other (See Comments)    Hallucinations  . Theophyllines Nausea And Vomiting    I appreciate the opportunity to take part in Tanveer's care. Please do not hesitate to contact me with questions.  Sincerely,   R. Edgar Frisk, MD

## 2018-09-17 NOTE — Assessment & Plan Note (Signed)
With exacerbation due to aeroallergen exposure.  Prednisone has been provided, 40 mg x3 days, 20 mg x1 day, 10 mg x1 day, then stop.  A prescription has been provided for Flovent (fluticasone) 110 g, 2 inhalations twice a day. To maximize pulmonary deposition, a spacer has been provided along with instructions for its proper administration with an HFA inhaler.  A prescription has been provided for montelukast 10 mg daily at bedtime.  Continue albuterol HFA, 1 to 2 inhalations every 4-6 hours if needed.  Subjective and objective measures of pulmonary function will be followed and the treatment plan will be adjusted accordingly.

## 2018-09-23 ENCOUNTER — Telehealth: Payer: Self-pay

## 2018-09-23 ENCOUNTER — Other Ambulatory Visit: Payer: Self-pay | Admitting: *Deleted

## 2018-09-23 MED ORDER — AZELASTINE HCL 0.15 % NA SOLN: 2.0000 | Freq: Two times a day (BID) | NASAL | 5 refills | Status: DC | PRN

## 2018-09-23 MED ORDER — PREDNISONE 10 MG PO TABS: ORAL_TABLET | ORAL | 0 refills | Status: DC

## 2018-09-23 NOTE — Telephone Encounter (Signed)
Called and left voicemail to return call.

## 2018-09-23 NOTE — Telephone Encounter (Signed)
Ok. Azelastine 0.15% nasal spray, 1-2 spray per nostril bid prn. Thanks.

## 2018-09-23 NOTE — Telephone Encounter (Signed)
Please check with Eliezer Lofts (drug rep) how to get Truett Perna paid for (there is some program via [employer] apparently). Thanks.

## 2018-09-23 NOTE — Telephone Encounter (Signed)
Orders placed, patient is aware

## 2018-09-23 NOTE — Telephone Encounter (Signed)
Prednisone 40 mg x 3 days, 30 mg x 3 days, 20 mg x 3 days, 10 mg x 3 days, then stop. Thanks.

## 2018-09-23 NOTE — Telephone Encounter (Signed)
Prednisone has been sent to the patient's pharmacy. Called patient and advised. Patient stated that the Anne Jefferson is working well for her but unfortunately it would cost her $300.00 a month for the medication. Patient would like to have advise on a different nasal spray that she can take.

## 2018-09-23 NOTE — Telephone Encounter (Signed)
Patient would like to talk to a nurse about follow up questions from her visit with Dr Verlin Fester on 09/17/2018

## 2018-09-23 NOTE — Telephone Encounter (Signed)
Dr. Verlin Fester please advise.  Spoke with patient, she states that she stopped Prednisone yesterday, and she is back to feeling bad again today.  She states that she has extreme fatigue and severe drainage.  She is wanting to know if she can be placed back on Prednisone, but taper off more slowly.   She stated that in the past, she has had better results with a longer taper.

## 2018-09-23 NOTE — Telephone Encounter (Signed)
There is but unfortunately it is for [facility] and [employer] patients. The patient has [employer] which unfortunately does not qualify her for the special program for [facility].

## 2018-09-23 NOTE — Telephone Encounter (Signed)
Patient is requesting that we leave detailed messages on her VM due to not being able to speak on the phone at work.

## 2018-10-15 ENCOUNTER — Ambulatory Visit: Admitting: Allergy and Immunology

## 2019-02-17 ENCOUNTER — Other Ambulatory Visit: Payer: Self-pay | Admitting: Allergy and Immunology

## 2019-03-16 IMAGING — US US ABDOMEN LIMITED
1 series · 14 of 25 positions shown · non-contrast
Comparison: Ultrasound [DATE].

CLINICAL DATA: Chronic calculous cholecystitis.  Liver mass.

EXAM:
ULTRASOUND ABDOMEN LIMITED RIGHT UPPER QUADRANT

[Series 1: us abdomen limited · 0.25mm/px · 14 of 44 slices shown]
[im 1/44]
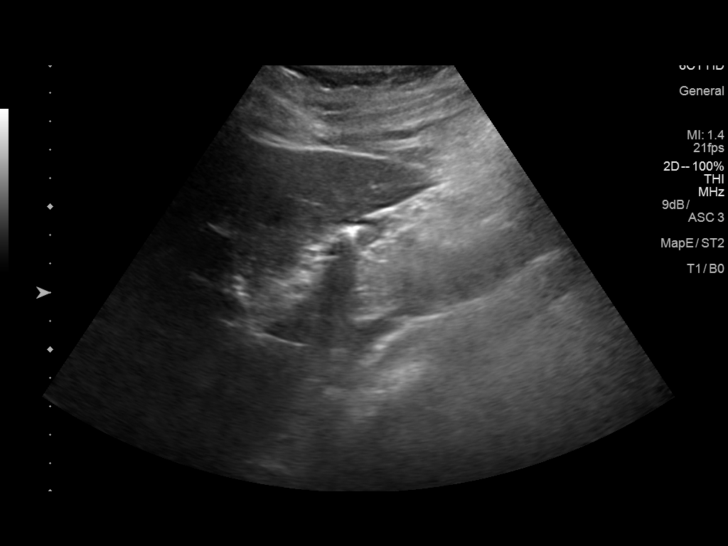
[im 4/44]
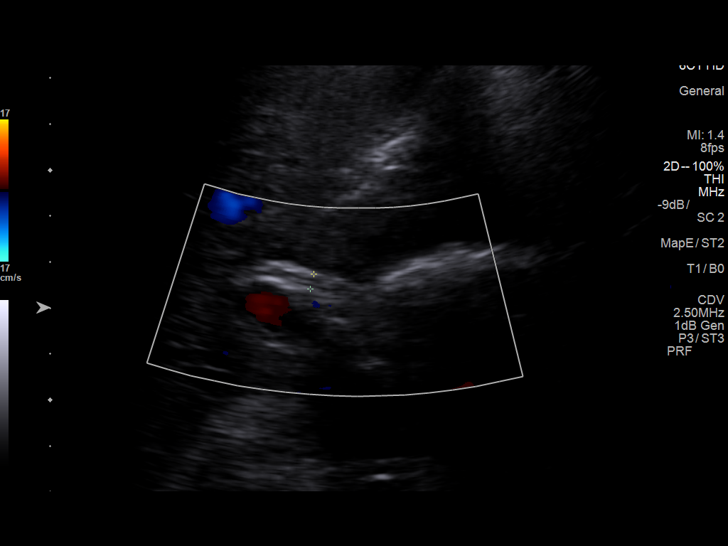
[im 8/44]
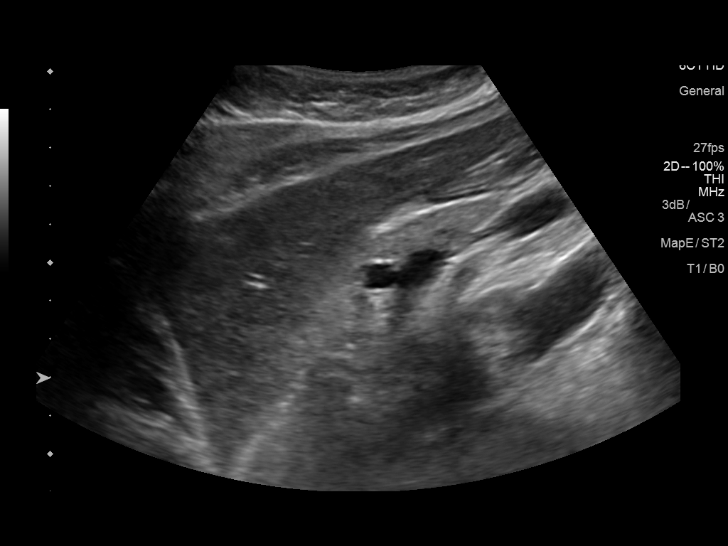
[im 11/44]
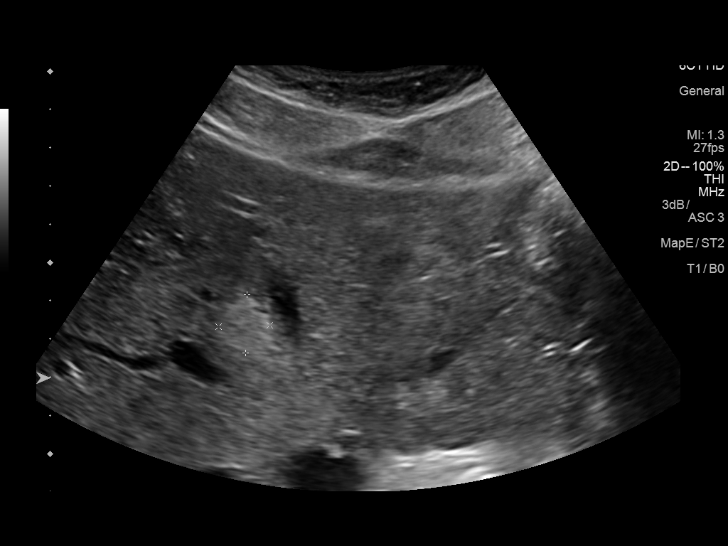
[im 15/44]
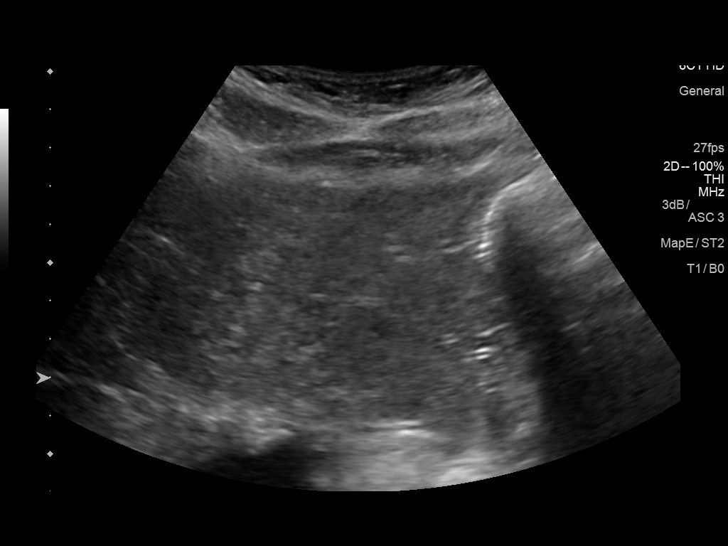
[im 17/44]
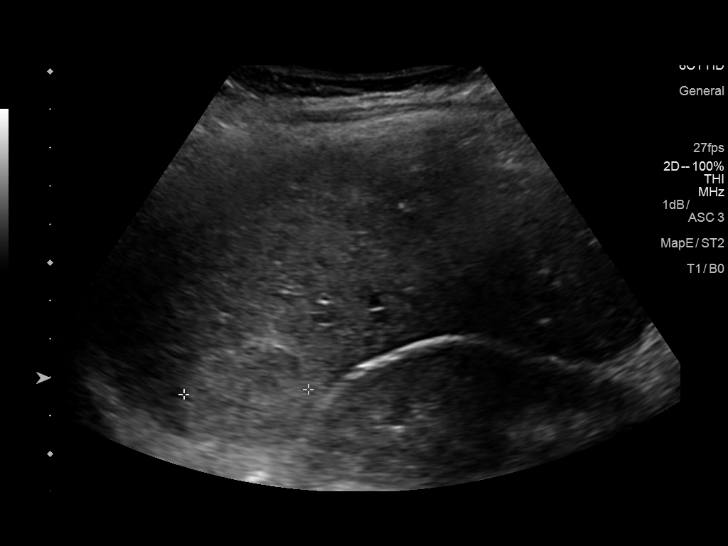
[im 20/44]
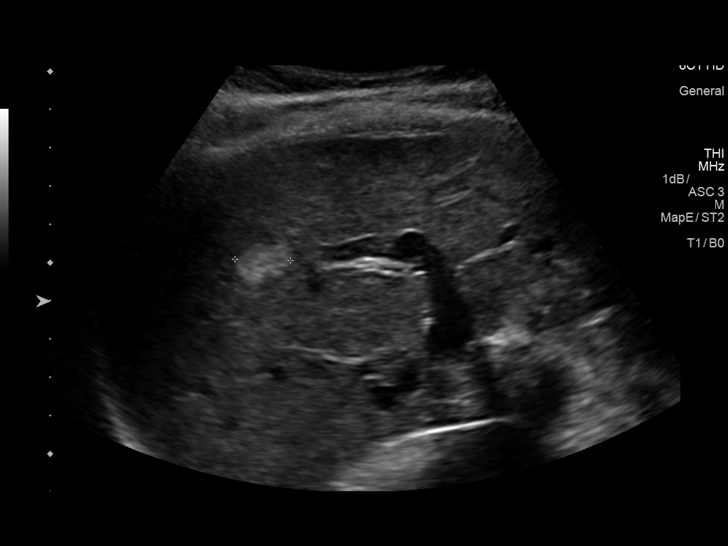
[im 24/44]
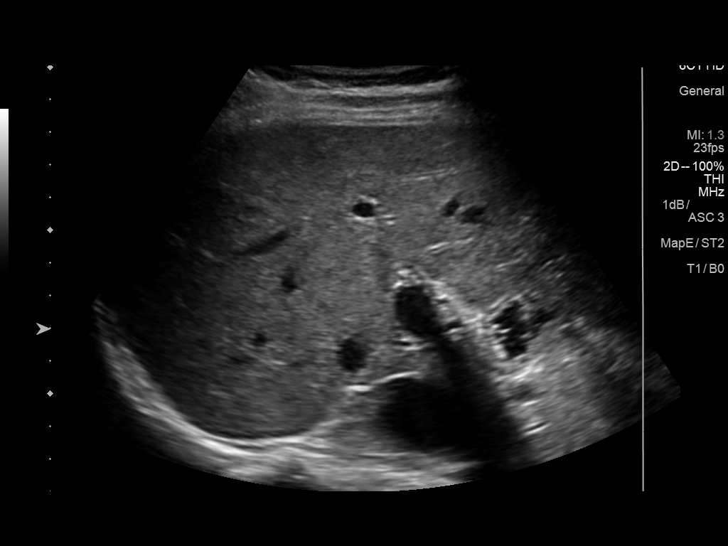
[im 27/44]
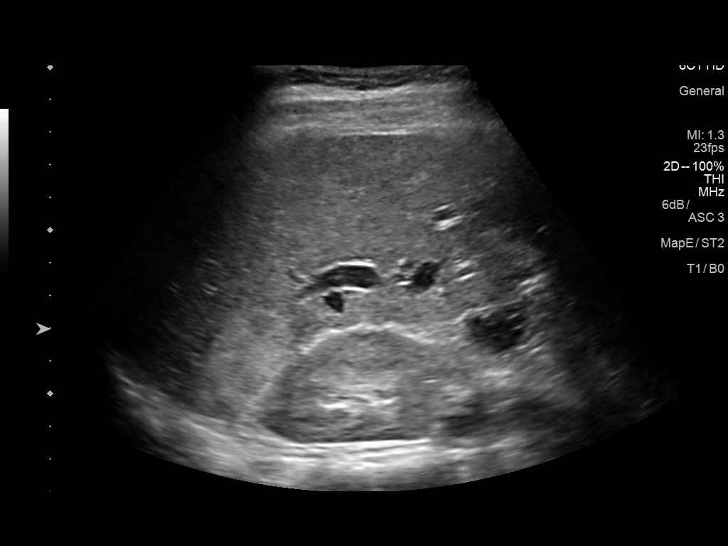
[im 29/44]
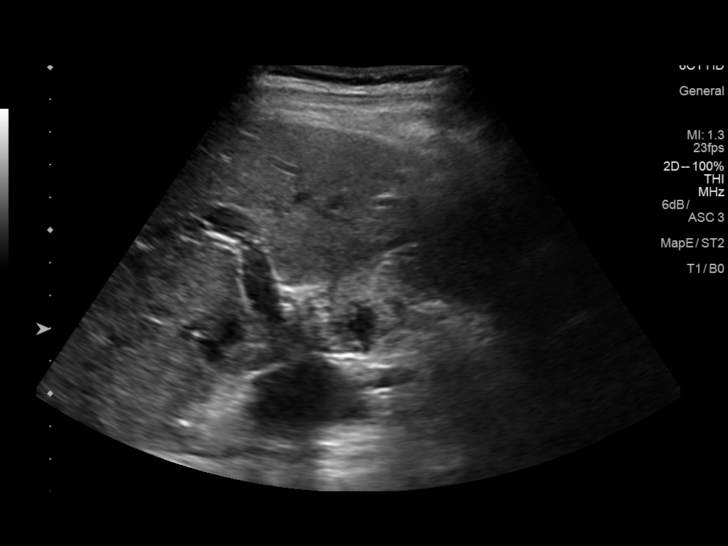
[im 33/44]
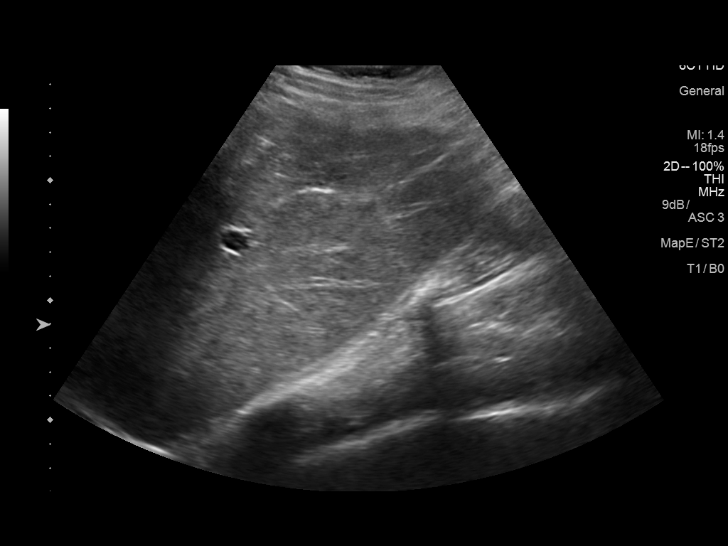
[im 36/44]
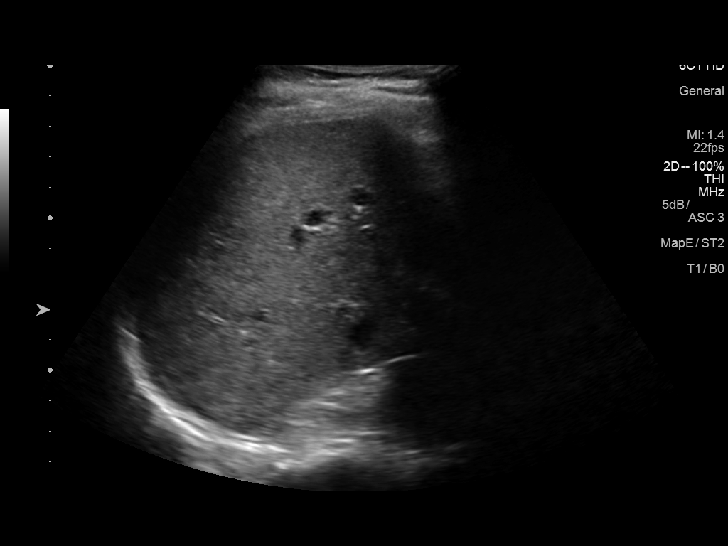
[im 40/44]
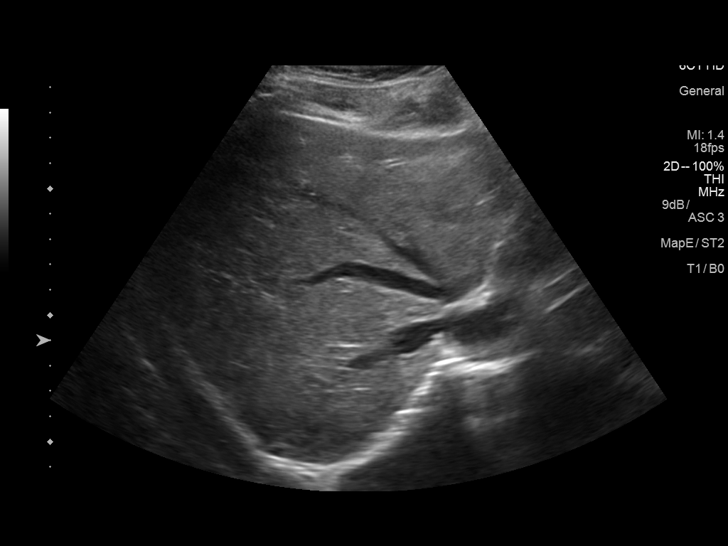
[im 44/44]
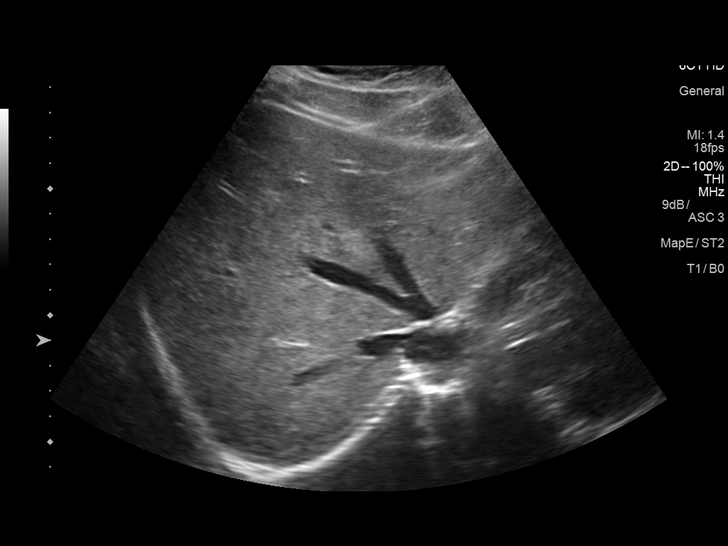

[14 of 25 positions shown; findings below may reference images not displayed]

FINDINGS: Gallbladder:

Status post cholecystectomy.

Common bile duct:

Diameter: 3 mm which is within normal limits.

Liver:

Stable 1.3 cm rounded hyperechoic focus in left hepatic lobe. Stable
3.1 cm rounded hyperechoic focus is seen in right hepatic lobe.
Stable 1.4 cm hypoechoic focus is seen in right hepatic lobe as
well.. Within normal limits in parenchymal echogenicity. Portal vein
is patent on color Doppler imaging with normal direction of blood
flow towards the liver.
IMPRESSION: Status post cholecystectomy. Stable rounded hyperechoic abnormality
seen in both hepatic lobes most consistent with benign hemangiomas.

## 2019-04-01 ENCOUNTER — Ambulatory Visit (INDEPENDENT_AMBULATORY_CARE_PROVIDER_SITE_OTHER): Admitting: Allergy and Immunology

## 2019-04-01 ENCOUNTER — Other Ambulatory Visit: Payer: Self-pay

## 2019-04-01 ENCOUNTER — Encounter: Payer: Self-pay | Admitting: Allergy and Immunology

## 2019-04-01 VITALS — BP 110/70 | HR 71 | Temp 97.3°F | Resp 12 | Ht 63.0 in | Wt 177.5 lb

## 2019-04-01 DIAGNOSIS — J4520 Mild intermittent asthma, uncomplicated: Secondary | ICD-10-CM | POA: Diagnosis not present

## 2019-04-01 DIAGNOSIS — L2089 Other atopic dermatitis: Secondary | ICD-10-CM | POA: Diagnosis not present

## 2019-04-01 DIAGNOSIS — J3089 Other allergic rhinitis: Secondary | ICD-10-CM | POA: Diagnosis not present

## 2019-04-01 DIAGNOSIS — L209 Atopic dermatitis, unspecified: Secondary | ICD-10-CM | POA: Insufficient documentation

## 2019-04-01 MED ORDER — LEVOCETIRIZINE DIHYDROCHLORIDE 5 MG PO TABS: 5.0000 mg | ORAL_TABLET | Freq: Every evening | ORAL | 5 refills | Status: DC

## 2019-04-01 NOTE — Assessment & Plan Note (Signed)
   Aeroallergen avoidance measures have been discussed and provided in written form.  Continue fluticasone nasal spray, 2 sprays per nostril daily as needed.  A prescription has been provided for levocetirizine, 5 mg daily as needed.  Nasal saline spray (i.e., Simply Saline) or nasal saline lavage (i.e., NeilMed) is recommended as needed and prior to medicated nasal sprays.  The risks and benefits of aeroallergen immunotherapy have been discussed. The patient is motivated to initiate immunotherapy to reduce symptoms and decrease medication requirement. Informed consent has been signed and allergen vaccine orders have been submitted. Medications will be decreased or discontinued as symptom relief from immunotherapy becomes evident.

## 2019-04-01 NOTE — Progress Notes (Signed)
Follow-up Note  RE: Anne Jefferson MRN: [id number] DOB: [birth year] Date of Office Visit: 04/01/2019  Primary care provider: Josetta Huddle, MD Referring provider: Josetta Huddle, MD  History of present illness: Anne Jefferson is a 43 y.o. female with persistent asthma and allergic rhinoconjunctivitis presenting today for follow-up and allergy skin testing.  She was previously seen in this clinic for her initial evaluation on September 17, 2018.  She had to get rid of her previous cat because of severe rhinoconjunctivitis symptoms after the cat had been in the home for approximately 4 days.  She works in other peoples homes and does not seem to have a problem when she is around cats or dogs for short duration.  She would like to have a cat in the home and therefore is interested in starting aeroallergen immunotherapy to reduce symptoms and decrease medication requirement.  At her last visit her asthma was suboptimally controlled, therefore she was started on Flovent and montelukast.  She reports that she discontinued both these medications when she moved into a new home in early August because her symptoms improved.  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  Other than a nasal steroid spray she is currently not using any allergy or asthma medications.  Assessment and plan: Perennial and seasonal allergic rhinitis  Aeroallergen avoidance measures have been discussed and provided in written form.  Continue fluticasone nasal spray, 2 sprays per nostril daily as needed.  A prescription has been provided for levocetirizine, 5 mg daily as needed.  Nasal saline spray (i.e., Simply Saline) or nasal saline lavage (i.e., NeilMed) is recommended as needed and prior to medicated nasal sprays.  The risks and benefits of aeroallergen immunotherapy have been discussed. The patient is motivated to initiate immunotherapy to reduce symptoms and  decrease medication requirement. Informed consent has been signed and allergen vaccine orders have been submitted. Medications will be decreased or discontinued as symptom relief from immunotherapy becomes evident.  Mild intermittent asthma  Continue albuterol HFA, 1 to 2 inhalations every 4-6 hours if needed  During respiratory tract infections or asthma flares, add Flovent 110g 2 inhalations 2 times per day until symptoms have returned to baseline.  Subjective and objective measures of pulmonary function will be followed and the treatment plan will be adjusted accordingly.  Atopic dermatitis  Continue appropriate skin care measures and hydrocortisone 2.5% sparingly to affected areas as needed.   Meds ordered this encounter  Medications  . levocetirizine (XYZAL) 5 MG tablet    Sig: Take 1 tablet (5 mg total) by mouth every evening.    Dispense:  30 tablet    Refill:  5    Diagnostics: Spirometry:  Normal with an FEV1 of 93% predicted.  This study was performed while the patient was asymptomatic.  Please see scanned spirometry results for details. Epicutaneous environmental testing: Positive to cat hair and dust mite antigen. Intradermal environmental testing: Positive to Johnson grass, major mold mix #2 and #4, and dog epithelia.   Physical examination: Blood pressure 110/70, pulse 71, temperature (!) 97.3 F (36.3 C), resp. rate 12, height 5\' 3"  (1.6 m), weight 177 lb 8 oz (80.5 kg), SpO2 99 %.  General: Alert, interactive, in no acute distress. HEENT: TMs pearly gray, turbinates edematous with clear discharge, post-pharynx moderately erythematous. Neck: Supple without lymphadenopathy. Lungs: Clear to auscultation without wheezing, rhonchi or rales. CV: Normal S1, S2 without murmurs. Skin: Warm and dry, without lesions or rashes.  The following portions of the patient's history were reviewed and updated as appropriate: allergies, current medications, past family history,  past medical history, past social history, past surgical history and problem list.  Allergies as of 04/01/2019      Reactions   Promethazine Other (See Comments)   Hallucinations   Theophyllines Nausea And Vomiting      Medication List       Accurate as of April 01, 2019  1:27 PM. If you have any questions, ask your nurse or doctor.        STOP taking these medications   predniSONE 10 MG tablet Commonly known as: DELTASONE Stopped by: Edmonia Lynch, MD     TAKE these medications   Azelastine HCl 0.15 % Soln Place 2 sprays into both nostrils 2 (two) times daily as needed.   Carbinoxamine Maleate 4 MG Tabs Take 1 tablet (4 mg total) by mouth every 6 (six) hours as needed.   fluticasone 110 MCG/ACT inhaler Commonly known as: Flovent HFA Inhale 2 puffs into the lungs 2 (two) times daily.   Fluticasone Propionate 93 MCG/ACT Exhu Commonly known as: Xhance Place 2 sprays into the nose 2 (two) times daily.   levocetirizine 5 MG tablet Commonly known as: XYZAL Take 1 tablet (5 mg total) by mouth every evening. Started by: Edmonia Lynch, MD   mometasone 50 MCG/ACT nasal spray Commonly known as: NASONEX Place 1 spray into the nose daily.   montelukast 10 MG tablet Commonly known as: SINGULAIR TAKE 1 TABLET BY MOUTH EVERYDAY AT BEDTIME       Allergies  Allergen Reactions  . Promethazine Other (See Comments)    Hallucinations  . Theophyllines Nausea And Vomiting   Review of systems: Review of systems negative except as noted in HPI / PMHx or noted below: Constitutional: Negative.  HENT: Negative.   Eyes: Negative.  Respiratory: Negative.   Cardiovascular: Negative.  Gastrointestinal: Negative.  Genitourinary: Negative.  Musculoskeletal: Negative.  Neurological: Negative.  Endo/Heme/Allergies: Negative.  Cutaneous: Negative.  Past Medical History:  Diagnosis Date  . Abnormal Pap smear   . Anxiety    no meds  . Asthma    mild, no issue for years  no  inhaler use except in winter  . Biliary colic   . Blood type, Rh negative   . Gallstones   . H/O dysmenorrhea   . Infertility, female   . Nausea and vomiting   . Pain    back and chest realted to gallbladder since november 2018  . PONV (postoperative nausea and vomiting)    severe, needs heavy anti nausea meds    Family History  Problem Relation Age of Onset  . Other Mother        Varicosities  . Diabetes Mother   . Allergic rhinitis Mother   . Diabetes Maternal Grandmother   . Skin cancer Maternal Grandmother     Social History   Socioeconomic History  . Marital status: Married    Spouse name: Not on file  . Number of children: Not on file  . Years of education: Not on file  . Highest education level: Not on file  Occupational History  . Not on file  Social Needs  . Financial resource strain: Not on file  . Food insecurity    Worry: Not on file    Inability: Not on file  . Transportation needs    Medical: Not on file    Non-medical: Not on file  Tobacco  Use  . Smoking status: Never Smoker  . Smokeless tobacco: Never Used  Substance and Sexual Activity  . Alcohol use: No  . Drug use: No  . Sexual activity: Yes    Birth control/protection: None  Lifestyle  . Physical activity    Days per week: Not on file    Minutes per session: Not on file  . Stress: Not on file  Relationships  . Social Herbalist on phone: Not on file    Gets together: Not on file    Attends religious service: Not on file    Active member of club or organization: Not on file    Attends meetings of clubs or organizations: Not on file    Relationship status: Not on file  . Intimate partner violence    Fear of current or ex partner: Not on file    Emotionally abused: Not on file    Physically abused: Not on file    Forced sexual activity: Not on file  Other Topics Concern  . Not on file  Social History Narrative  . Not on file    I appreciate the opportunity to take  part in Chanell's care. Please do not hesitate to contact me with questions.  Sincerely,   R. Edgar Frisk, MD

## 2019-04-01 NOTE — Assessment & Plan Note (Signed)
   Continue appropriate skin care measures and hydrocortisone 2.5% sparingly to affected areas as needed.

## 2019-04-01 NOTE — Patient Instructions (Addendum)
Perennial and seasonal allergic rhinitis  Aeroallergen avoidance measures have been discussed and provided in written form.  Continue fluticasone nasal spray, 2 sprays per nostril daily as needed.  A prescription has been provided for levocetirizine, 5 mg daily as needed.  Nasal saline spray (i.e., Simply Saline) or nasal saline lavage (i.e., NeilMed) is recommended as needed and prior to medicated nasal sprays.  The risks and benefits of aeroallergen immunotherapy have been discussed. The patient is motivated to initiate immunotherapy to reduce symptoms and decrease medication requirement. Informed consent has been signed and allergen vaccine orders have been submitted. Medications will be decreased or discontinued as symptom relief from immunotherapy becomes evident.  Mild intermittent asthma  Continue albuterol HFA, 1 to 2 inhalations every 4-6 hours if needed  During respiratory tract infections or asthma flares, add Flovent 110g 2 inhalations 2 times per day until symptoms have returned to baseline.  Subjective and objective measures of pulmonary function will be followed and the treatment plan will be adjusted accordingly.  Atopic dermatitis  Continue appropriate skin care measures and hydrocortisone 2.5% sparingly to affected areas as needed.   Return in about 3 months (around 07/01/2019), or if symptoms worsen or fail to improve.  Control of House Dust Mite Allergen  House dust mites play a major role in allergic asthma and rhinitis.  They occur in environments with high humidity wherever human skin, the food for dust mites is found. High levels have been detected in dust obtained from mattresses, pillows, carpets, upholstered furniture, bed covers, clothes and soft toys.  The principal allergen of the house dust mite is found in its feces.  A gram of dust may contain 1,000 mites and 250,000 fecal particles.  Mite antigen is easily measured in the air during house cleaning  activities.    1. Encase mattresses, including the box spring, and pillow, in an air tight cover.  Seal the zipper end of the encased mattresses with wide adhesive tape. 2. Wash the bedding in water of 130 degrees Farenheit weekly.  Avoid cotton comforters/quilts and flannel bedding: the most ideal bed covering is the dacron comforter. 3. Remove all upholstered furniture from the bedroom. 4. Remove carpets, carpet padding, rugs, and non-washable window drapes from the bedroom.  Wash drapes weekly or use plastic window coverings. 5. Remove all non-washable stuffed toys from the bedroom.  Wash stuffed toys weekly. 6. Have the room cleaned frequently with a vacuum cleaner and a damp dust-mop.  The patient should not be in a room which is being cleaned and should wait 1 hour after cleaning before going into the room. 7. Close and seal all heating outlets in the bedroom.  Otherwise, the room will become filled with dust-laden air.  An electric heater can be used to heat the room. 8. Reduce indoor humidity to less than 50%.  Do not use a humidifier.  Control of Dog or Cat Allergen  Avoidance is the best way to manage a dog or cat allergy. If you have a dog or cat and are allergic to dog or cats, consider removing the dog or cat from the home. If you have a dog or cat but don't want to find it a new home, or if your family wants a pet even though someone in the household is allergic, here are some strategies that may help keep symptoms at bay:  1. Keep the pet out of your bedroom and restrict it to only a few rooms. Be advised that keeping the  dog or cat in only one room will not limit the allergens to that room. 2. Don't pet, hug or kiss the dog or cat; if you do, wash your hands with soap and water. 3. High-efficiency particulate air (HEPA) cleaners run continuously in a bedroom or living room can reduce allergen levels over time. 4. Place electrostatic material sheet in the air inlet vent in the  bedroom. 5. Regular use of a high-efficiency vacuum cleaner or a central vacuum can reduce allergen levels. 6. Giving your dog or cat a bath at least once a week can reduce airborne allergen.  Control of Mold Allergen  Mold and fungi can grow on a variety of surfaces provided certain temperature and moisture conditions exist.  Outdoor molds grow on plants, decaying vegetation and soil.  The major outdoor mold, Alternaria and Cladosporium, are found in very high numbers during hot and dry conditions.  Generally, a late Summer - Fall peak is seen for common outdoor fungal spores.  Rain will temporarily lower outdoor mold spore count, but counts rise rapidly when the rainy period ends.  The most important indoor molds are Aspergillus and Penicillium.  Dark, humid and poorly ventilated basements are ideal sites for mold growth.  The next most common sites of mold growth are the bathroom and the kitchen.  Outdoor [employer] 1. Use air conditioning and keep windows closed 2. Avoid exposure to decaying vegetation. 3. Avoid leaf raking. 4. Avoid grain handling. 5. Consider wearing a face mask if working in moldy areas.  Indoor Mold Control 1. Maintain humidity below 50%. 2. Clean washable surfaces with 5% bleach solution. 3. Remove sources e.g. Contaminated carpets.  Reducing Pollen Exposure  The American Academy of Allergy, Asthma and Immunology suggests the following steps to reduce your exposure to pollen during allergy seasons.    1. Do not hang sheets or clothing out to dry; pollen may collect on these items. 2. Do not mow lawns or spend time around freshly cut grass; mowing stirs up pollen. 3. Keep windows closed at night.  Keep car windows closed while driving. 4. Minimize morning activities outdoors, a time when pollen counts are usually at their highest. 5. Stay indoors as much as possible when pollen counts or humidity is high and on windy days when pollen tends to remain in the air  longer. 6. Use air conditioning when possible.  Many air conditioners have filters that trap the pollen spores. 7. Use a HEPA room air filter to remove pollen form the indoor air you breathe.

## 2019-04-01 NOTE — Progress Notes (Signed)
VIALS EXP 03-31-20

## 2019-04-01 NOTE — Assessment & Plan Note (Signed)
   Continue albuterol HFA, 1 to 2 inhalations every 4-6 hours if needed  During respiratory tract infections or asthma flares, add Flovent 110g 2 inhalations 2 times per day until symptoms have returned to baseline.  Subjective and objective measures of pulmonary function will be followed and the treatment plan will be adjusted accordingly.

## 2019-04-02 DIAGNOSIS — J3089 Other allergic rhinitis: Secondary | ICD-10-CM | POA: Diagnosis not present

## 2019-04-03 DIAGNOSIS — J301 Allergic rhinitis due to pollen: Secondary | ICD-10-CM | POA: Diagnosis not present

## 2019-04-22 ENCOUNTER — Telehealth: Payer: Self-pay | Admitting: Allergy and Immunology

## 2019-04-22 ENCOUNTER — Ambulatory Visit (INDEPENDENT_AMBULATORY_CARE_PROVIDER_SITE_OTHER)

## 2019-04-22 ENCOUNTER — Other Ambulatory Visit: Payer: Self-pay

## 2019-04-22 DIAGNOSIS — J309 Allergic rhinitis, unspecified: Secondary | ICD-10-CM

## 2019-04-22 MED ORDER — EPINEPHRINE 0.3 MG/0.3ML IJ SOAJ: 0.3000 mg | INTRAMUSCULAR | 1 refills | Status: DC | PRN

## 2019-04-22 NOTE — Telephone Encounter (Signed)
Called and it went straight to voicemail, was unable to leave a message due to mailbox being.

## 2019-04-22 NOTE — Progress Notes (Signed)
Immunotherapy   Patient Details  Name: Anne Jefferson MRN: [id number] Date of Birth: 09-02-1975  04/22/2019  [name] New Start allergy injections Following schedule: A  Frequency:1-2 times weekly Epi-Pen:Pt didn't receive Auvi-Q but she is going to call to have them mail it to her.  Consent signed and patient instructions given.   [name] 04/22/2019, 2:20 PM

## 2019-04-22 NOTE — Telephone Encounter (Signed)
Patient received her first allergy shots this morning, 04-22-2019. She was given information about how to call for her epi pen. She has called the company twice, first time they found her name, but she was disconnected, second time she called, they said they had no record of her in their system. She wants to know if there is another way to get an epi pen, or a different brand, or if we could call the company.

## 2019-04-23 NOTE — Telephone Encounter (Signed)
Called and was unable to leave message due to mailbox being full.

## 2019-04-24 ENCOUNTER — Other Ambulatory Visit: Payer: Self-pay

## 2019-04-24 ENCOUNTER — Ambulatory Visit (INDEPENDENT_AMBULATORY_CARE_PROVIDER_SITE_OTHER)

## 2019-04-24 DIAGNOSIS — J309 Allergic rhinitis, unspecified: Secondary | ICD-10-CM

## 2019-04-24 NOTE — Telephone Encounter (Signed)
Spoke with patient today when she came in for her allergy injections. Patient stated that they are shipping it to her.

## 2019-04-29 ENCOUNTER — Ambulatory Visit (INDEPENDENT_AMBULATORY_CARE_PROVIDER_SITE_OTHER)

## 2019-04-29 ENCOUNTER — Other Ambulatory Visit: Payer: Self-pay

## 2019-04-29 DIAGNOSIS — J309 Allergic rhinitis, unspecified: Secondary | ICD-10-CM

## 2019-05-06 ENCOUNTER — Other Ambulatory Visit: Payer: Self-pay

## 2019-05-06 ENCOUNTER — Ambulatory Visit (INDEPENDENT_AMBULATORY_CARE_PROVIDER_SITE_OTHER)

## 2019-05-06 DIAGNOSIS — J309 Allergic rhinitis, unspecified: Secondary | ICD-10-CM

## 2019-05-08 ENCOUNTER — Ambulatory Visit (INDEPENDENT_AMBULATORY_CARE_PROVIDER_SITE_OTHER)

## 2019-05-08 ENCOUNTER — Other Ambulatory Visit: Payer: Self-pay

## 2019-05-08 DIAGNOSIS — J309 Allergic rhinitis, unspecified: Secondary | ICD-10-CM | POA: Diagnosis not present

## 2019-05-08 MED ORDER — CARBINOXAMINE MALEATE 4 MG PO TABS: 4.0000 mg | ORAL_TABLET | Freq: Four times a day (QID) | ORAL | 0 refills | Status: DC | PRN

## 2019-05-13 ENCOUNTER — Ambulatory Visit (INDEPENDENT_AMBULATORY_CARE_PROVIDER_SITE_OTHER)

## 2019-05-13 ENCOUNTER — Other Ambulatory Visit: Payer: Self-pay

## 2019-05-13 DIAGNOSIS — J309 Allergic rhinitis, unspecified: Secondary | ICD-10-CM

## 2019-05-15 ENCOUNTER — Ambulatory Visit (INDEPENDENT_AMBULATORY_CARE_PROVIDER_SITE_OTHER)

## 2019-05-15 ENCOUNTER — Other Ambulatory Visit: Payer: Self-pay

## 2019-05-15 DIAGNOSIS — J309 Allergic rhinitis, unspecified: Secondary | ICD-10-CM | POA: Diagnosis not present

## 2019-05-27 ENCOUNTER — Other Ambulatory Visit: Payer: Self-pay

## 2019-05-27 ENCOUNTER — Ambulatory Visit (INDEPENDENT_AMBULATORY_CARE_PROVIDER_SITE_OTHER)

## 2019-05-27 DIAGNOSIS — J309 Allergic rhinitis, unspecified: Secondary | ICD-10-CM

## 2019-05-29 ENCOUNTER — Other Ambulatory Visit: Payer: Self-pay

## 2019-05-29 ENCOUNTER — Ambulatory Visit (INDEPENDENT_AMBULATORY_CARE_PROVIDER_SITE_OTHER): Admitting: *Deleted

## 2019-05-29 DIAGNOSIS — J309 Allergic rhinitis, unspecified: Secondary | ICD-10-CM

## 2019-06-01 ENCOUNTER — Other Ambulatory Visit: Payer: Self-pay | Admitting: Allergy and Immunology

## 2019-06-03 ENCOUNTER — Ambulatory Visit (INDEPENDENT_AMBULATORY_CARE_PROVIDER_SITE_OTHER)

## 2019-06-03 ENCOUNTER — Other Ambulatory Visit: Payer: Self-pay

## 2019-06-03 DIAGNOSIS — J309 Allergic rhinitis, unspecified: Secondary | ICD-10-CM

## 2019-06-05 ENCOUNTER — Ambulatory Visit (INDEPENDENT_AMBULATORY_CARE_PROVIDER_SITE_OTHER)

## 2019-06-05 ENCOUNTER — Other Ambulatory Visit: Payer: Self-pay

## 2019-06-05 DIAGNOSIS — J309 Allergic rhinitis, unspecified: Secondary | ICD-10-CM

## 2019-06-10 ENCOUNTER — Other Ambulatory Visit: Payer: Self-pay

## 2019-06-10 ENCOUNTER — Ambulatory Visit (INDEPENDENT_AMBULATORY_CARE_PROVIDER_SITE_OTHER)

## 2019-06-10 DIAGNOSIS — J309 Allergic rhinitis, unspecified: Secondary | ICD-10-CM

## 2019-06-12 ENCOUNTER — Other Ambulatory Visit: Payer: Self-pay

## 2019-06-12 ENCOUNTER — Ambulatory Visit (INDEPENDENT_AMBULATORY_CARE_PROVIDER_SITE_OTHER)

## 2019-06-12 DIAGNOSIS — J309 Allergic rhinitis, unspecified: Secondary | ICD-10-CM

## 2019-06-17 ENCOUNTER — Ambulatory Visit (INDEPENDENT_AMBULATORY_CARE_PROVIDER_SITE_OTHER)

## 2019-06-17 ENCOUNTER — Other Ambulatory Visit: Payer: Self-pay

## 2019-06-17 DIAGNOSIS — J309 Allergic rhinitis, unspecified: Secondary | ICD-10-CM | POA: Diagnosis not present

## 2019-06-24 ENCOUNTER — Ambulatory Visit (INDEPENDENT_AMBULATORY_CARE_PROVIDER_SITE_OTHER)

## 2019-06-24 ENCOUNTER — Other Ambulatory Visit: Payer: Self-pay

## 2019-06-24 DIAGNOSIS — J309 Allergic rhinitis, unspecified: Secondary | ICD-10-CM

## 2019-06-26 ENCOUNTER — Ambulatory Visit (INDEPENDENT_AMBULATORY_CARE_PROVIDER_SITE_OTHER)

## 2019-06-26 ENCOUNTER — Other Ambulatory Visit: Payer: Self-pay

## 2019-06-26 DIAGNOSIS — J309 Allergic rhinitis, unspecified: Secondary | ICD-10-CM | POA: Diagnosis not present

## 2019-07-01 ENCOUNTER — Other Ambulatory Visit: Payer: Self-pay

## 2019-07-01 ENCOUNTER — Ambulatory Visit (INDEPENDENT_AMBULATORY_CARE_PROVIDER_SITE_OTHER)

## 2019-07-01 DIAGNOSIS — J309 Allergic rhinitis, unspecified: Secondary | ICD-10-CM

## 2019-07-08 ENCOUNTER — Ambulatory Visit (INDEPENDENT_AMBULATORY_CARE_PROVIDER_SITE_OTHER)

## 2019-07-08 ENCOUNTER — Other Ambulatory Visit: Payer: Self-pay

## 2019-07-08 DIAGNOSIS — J309 Allergic rhinitis, unspecified: Secondary | ICD-10-CM | POA: Diagnosis not present

## 2019-07-10 ENCOUNTER — Other Ambulatory Visit: Payer: Self-pay

## 2019-07-10 ENCOUNTER — Ambulatory Visit (INDEPENDENT_AMBULATORY_CARE_PROVIDER_SITE_OTHER)

## 2019-07-10 DIAGNOSIS — J309 Allergic rhinitis, unspecified: Secondary | ICD-10-CM | POA: Diagnosis not present

## 2019-07-15 ENCOUNTER — Other Ambulatory Visit: Payer: Self-pay

## 2019-07-15 ENCOUNTER — Ambulatory Visit (INDEPENDENT_AMBULATORY_CARE_PROVIDER_SITE_OTHER)

## 2019-07-15 DIAGNOSIS — J309 Allergic rhinitis, unspecified: Secondary | ICD-10-CM

## 2019-07-22 ENCOUNTER — Other Ambulatory Visit: Payer: Self-pay

## 2019-07-22 ENCOUNTER — Ambulatory Visit (INDEPENDENT_AMBULATORY_CARE_PROVIDER_SITE_OTHER)

## 2019-07-22 DIAGNOSIS — J309 Allergic rhinitis, unspecified: Secondary | ICD-10-CM | POA: Diagnosis not present

## 2019-07-29 ENCOUNTER — Ambulatory Visit (INDEPENDENT_AMBULATORY_CARE_PROVIDER_SITE_OTHER)

## 2019-07-29 ENCOUNTER — Other Ambulatory Visit: Payer: Self-pay

## 2019-07-29 DIAGNOSIS — J309 Allergic rhinitis, unspecified: Secondary | ICD-10-CM | POA: Diagnosis not present

## 2019-08-12 ENCOUNTER — Other Ambulatory Visit: Payer: Self-pay

## 2019-08-12 ENCOUNTER — Ambulatory Visit (INDEPENDENT_AMBULATORY_CARE_PROVIDER_SITE_OTHER)

## 2019-08-12 DIAGNOSIS — J309 Allergic rhinitis, unspecified: Secondary | ICD-10-CM | POA: Diagnosis not present

## 2019-08-14 ENCOUNTER — Other Ambulatory Visit: Payer: Self-pay

## 2019-08-14 ENCOUNTER — Ambulatory Visit (INDEPENDENT_AMBULATORY_CARE_PROVIDER_SITE_OTHER)

## 2019-08-14 DIAGNOSIS — J309 Allergic rhinitis, unspecified: Secondary | ICD-10-CM | POA: Diagnosis not present

## 2019-09-04 ENCOUNTER — Ambulatory Visit (INDEPENDENT_AMBULATORY_CARE_PROVIDER_SITE_OTHER)

## 2019-09-04 ENCOUNTER — Other Ambulatory Visit: Payer: Self-pay

## 2019-09-04 DIAGNOSIS — J309 Allergic rhinitis, unspecified: Secondary | ICD-10-CM | POA: Diagnosis not present

## 2019-09-16 ENCOUNTER — Ambulatory Visit (INDEPENDENT_AMBULATORY_CARE_PROVIDER_SITE_OTHER)

## 2019-09-16 ENCOUNTER — Other Ambulatory Visit: Payer: Self-pay

## 2019-09-16 DIAGNOSIS — J309 Allergic rhinitis, unspecified: Secondary | ICD-10-CM | POA: Diagnosis not present

## 2019-09-25 ENCOUNTER — Other Ambulatory Visit: Payer: Self-pay

## 2019-09-25 ENCOUNTER — Ambulatory Visit (INDEPENDENT_AMBULATORY_CARE_PROVIDER_SITE_OTHER)

## 2019-09-25 DIAGNOSIS — J309 Allergic rhinitis, unspecified: Secondary | ICD-10-CM | POA: Diagnosis not present

## 2019-10-02 ENCOUNTER — Other Ambulatory Visit: Payer: Self-pay

## 2019-10-02 ENCOUNTER — Ambulatory Visit (INDEPENDENT_AMBULATORY_CARE_PROVIDER_SITE_OTHER)

## 2019-10-02 DIAGNOSIS — J309 Allergic rhinitis, unspecified: Secondary | ICD-10-CM

## 2019-10-14 ENCOUNTER — Ambulatory Visit (INDEPENDENT_AMBULATORY_CARE_PROVIDER_SITE_OTHER)

## 2019-10-14 ENCOUNTER — Other Ambulatory Visit: Payer: Self-pay

## 2019-10-14 DIAGNOSIS — J309 Allergic rhinitis, unspecified: Secondary | ICD-10-CM | POA: Diagnosis not present

## 2019-10-21 ENCOUNTER — Other Ambulatory Visit: Payer: Self-pay

## 2019-10-21 ENCOUNTER — Ambulatory Visit (INDEPENDENT_AMBULATORY_CARE_PROVIDER_SITE_OTHER)

## 2019-10-21 DIAGNOSIS — J309 Allergic rhinitis, unspecified: Secondary | ICD-10-CM | POA: Diagnosis not present

## 2019-10-23 ENCOUNTER — Ambulatory Visit

## 2019-10-23 ENCOUNTER — Other Ambulatory Visit: Payer: Self-pay

## 2019-10-28 ENCOUNTER — Ambulatory Visit (INDEPENDENT_AMBULATORY_CARE_PROVIDER_SITE_OTHER)

## 2019-10-28 ENCOUNTER — Other Ambulatory Visit: Payer: Self-pay

## 2019-10-28 DIAGNOSIS — J309 Allergic rhinitis, unspecified: Secondary | ICD-10-CM

## 2019-10-30 ENCOUNTER — Other Ambulatory Visit: Payer: Self-pay

## 2019-10-30 ENCOUNTER — Ambulatory Visit (INDEPENDENT_AMBULATORY_CARE_PROVIDER_SITE_OTHER)

## 2019-10-30 DIAGNOSIS — J309 Allergic rhinitis, unspecified: Secondary | ICD-10-CM | POA: Diagnosis not present

## 2019-11-06 ENCOUNTER — Ambulatory Visit (INDEPENDENT_AMBULATORY_CARE_PROVIDER_SITE_OTHER): Payer: 59

## 2019-11-06 ENCOUNTER — Other Ambulatory Visit: Payer: Self-pay

## 2019-11-06 DIAGNOSIS — J309 Allergic rhinitis, unspecified: Secondary | ICD-10-CM

## 2019-11-07 DIAGNOSIS — J349 Unspecified disorder of nose and nasal sinuses: Secondary | ICD-10-CM | POA: Diagnosis not present

## 2019-11-11 ENCOUNTER — Ambulatory Visit (INDEPENDENT_AMBULATORY_CARE_PROVIDER_SITE_OTHER): Payer: 59

## 2019-11-11 ENCOUNTER — Other Ambulatory Visit: Payer: Self-pay

## 2019-11-11 DIAGNOSIS — J309 Allergic rhinitis, unspecified: Secondary | ICD-10-CM

## 2019-11-17 DIAGNOSIS — L218 Other seborrheic dermatitis: Secondary | ICD-10-CM | POA: Diagnosis not present

## 2019-11-17 DIAGNOSIS — D225 Melanocytic nevi of trunk: Secondary | ICD-10-CM | POA: Diagnosis not present

## 2019-11-18 ENCOUNTER — Other Ambulatory Visit: Payer: Self-pay

## 2019-11-18 ENCOUNTER — Ambulatory Visit (INDEPENDENT_AMBULATORY_CARE_PROVIDER_SITE_OTHER): Payer: 59

## 2019-11-18 DIAGNOSIS — J309 Allergic rhinitis, unspecified: Secondary | ICD-10-CM | POA: Diagnosis not present

## 2019-12-04 ENCOUNTER — Other Ambulatory Visit: Payer: Self-pay

## 2019-12-04 ENCOUNTER — Ambulatory Visit (INDEPENDENT_AMBULATORY_CARE_PROVIDER_SITE_OTHER): Payer: 59

## 2019-12-04 DIAGNOSIS — J309 Allergic rhinitis, unspecified: Secondary | ICD-10-CM | POA: Diagnosis not present

## 2019-12-09 ENCOUNTER — Other Ambulatory Visit: Payer: Self-pay

## 2019-12-09 ENCOUNTER — Ambulatory Visit (INDEPENDENT_AMBULATORY_CARE_PROVIDER_SITE_OTHER): Payer: 59

## 2019-12-09 DIAGNOSIS — J309 Allergic rhinitis, unspecified: Secondary | ICD-10-CM | POA: Diagnosis not present

## 2019-12-23 ENCOUNTER — Ambulatory Visit (INDEPENDENT_AMBULATORY_CARE_PROVIDER_SITE_OTHER): Payer: 59

## 2019-12-23 ENCOUNTER — Other Ambulatory Visit: Payer: Self-pay

## 2019-12-23 DIAGNOSIS — J309 Allergic rhinitis, unspecified: Secondary | ICD-10-CM

## 2019-12-25 ENCOUNTER — Ambulatory Visit (INDEPENDENT_AMBULATORY_CARE_PROVIDER_SITE_OTHER): Payer: 59

## 2019-12-25 ENCOUNTER — Other Ambulatory Visit: Payer: Self-pay

## 2019-12-25 DIAGNOSIS — J309 Allergic rhinitis, unspecified: Secondary | ICD-10-CM

## 2020-01-01 ENCOUNTER — Ambulatory Visit (INDEPENDENT_AMBULATORY_CARE_PROVIDER_SITE_OTHER): Payer: 59

## 2020-01-01 ENCOUNTER — Other Ambulatory Visit: Payer: Self-pay

## 2020-01-01 DIAGNOSIS — J309 Allergic rhinitis, unspecified: Secondary | ICD-10-CM

## 2020-01-06 ENCOUNTER — Ambulatory Visit (INDEPENDENT_AMBULATORY_CARE_PROVIDER_SITE_OTHER): Payer: 59

## 2020-01-06 ENCOUNTER — Other Ambulatory Visit: Payer: Self-pay

## 2020-01-06 DIAGNOSIS — J309 Allergic rhinitis, unspecified: Secondary | ICD-10-CM

## 2020-01-15 ENCOUNTER — Telehealth: Payer: Self-pay

## 2020-01-15 ENCOUNTER — Ambulatory Visit (INDEPENDENT_AMBULATORY_CARE_PROVIDER_SITE_OTHER): Payer: 59

## 2020-01-15 ENCOUNTER — Other Ambulatory Visit: Payer: Self-pay

## 2020-01-15 DIAGNOSIS — J309 Allergic rhinitis, unspecified: Secondary | ICD-10-CM | POA: Diagnosis not present

## 2020-01-15 NOTE — Telephone Encounter (Signed)
Patient came in to get her allergy injections and informed me that she will be going out of town in July to visit family who have dogs. She stated every year she has a bad allergy flare and is wondering what else she can take to help. Patient stated that  taking her antihistamine is never enough. She has stopped taking her antihistamine since she has not needed them. I did recommend she start taking them before she goes, so that it is in her system. Is there anything else she can take with her antihistamine to help reduce or prevent allergy flares while she is there for a week. Please advise.

## 2020-01-19 NOTE — Telephone Encounter (Signed)
Called but was unable to leave a message due to mailbox not being set up. Will speak with the patient and provide prednisone when patient comes in for her allergy injections this week.

## 2020-01-19 NOTE — Telephone Encounter (Signed)
Please provide prednisone, 20 mg x 4 days, 10 mg x1 day, then stop.  Let me know if the visit with the family is longer than 5 days. Thanks.

## 2020-01-22 ENCOUNTER — Other Ambulatory Visit: Payer: Self-pay

## 2020-01-22 ENCOUNTER — Ambulatory Visit (INDEPENDENT_AMBULATORY_CARE_PROVIDER_SITE_OTHER): Payer: 59

## 2020-01-22 DIAGNOSIS — J309 Allergic rhinitis, unspecified: Secondary | ICD-10-CM | POA: Diagnosis not present

## 2020-01-22 NOTE — Telephone Encounter (Signed)
Spoke with pt she is on the way to [facility] now for injection

## 2020-01-22 NOTE — Telephone Encounter (Signed)
Patient came in and received the prednisone with instructions. Patient verbalized understanding.

## 2020-01-27 ENCOUNTER — Ambulatory Visit (INDEPENDENT_AMBULATORY_CARE_PROVIDER_SITE_OTHER): Payer: 59

## 2020-01-27 ENCOUNTER — Other Ambulatory Visit: Payer: Self-pay

## 2020-01-27 DIAGNOSIS — J309 Allergic rhinitis, unspecified: Secondary | ICD-10-CM | POA: Diagnosis not present

## 2020-02-19 ENCOUNTER — Ambulatory Visit (INDEPENDENT_AMBULATORY_CARE_PROVIDER_SITE_OTHER): Payer: 59

## 2020-02-19 ENCOUNTER — Other Ambulatory Visit: Payer: Self-pay

## 2020-02-19 DIAGNOSIS — J309 Allergic rhinitis, unspecified: Secondary | ICD-10-CM | POA: Diagnosis not present

## 2020-02-24 ENCOUNTER — Ambulatory Visit (INDEPENDENT_AMBULATORY_CARE_PROVIDER_SITE_OTHER): Payer: 59

## 2020-02-24 ENCOUNTER — Other Ambulatory Visit: Payer: Self-pay

## 2020-02-24 DIAGNOSIS — J309 Allergic rhinitis, unspecified: Secondary | ICD-10-CM

## 2020-02-24 NOTE — Progress Notes (Signed)
Follow Up Note  RE: Anne Jefferson MRN: [id number] DOB: 09-28-1975 Date of Office Visit: 02/25/2020  Referring provider: Josetta Huddle, MD Primary care provider: Josetta Huddle, MD  Chief Complaint: Allergic Rhinitis  (want to see severity of her allergies )  History of Present Illness: I had the pleasure of seeing Anne Jefferson for a follow up visit at the [facility] on 02/25/2020. She is a 44 y.o. female, who is being followed for allergic rhinitis on AIT, asthma and atopic dermatitis. Her previous allergy office visit was on 04/01/2019 with Dr. Verlin Fester. Today is a regular follow up visit.  Perennial and seasonal allergic rhinitis Currently on allergy injections and has some large localized reactions. Not taking any antihistamines on a daily basis.   Uses nasal spray on rare occasions.  Patient wants to discuss getting bloodwork or skin testing to cat and dog dander.  She was on vacation with people who had dogs and cats. Didn't have any major allergy flare but had headaches. She wasn't sure if it was sinus headaches or PMS headaches.  There is also an outdoor cat and wondering when she can have an indoor pet.  Mild intermittent asthma Denies any SOB, coughing, wheezing, chest tightness, nocturnal awakenings, ER/urgent care visits or prednisone use since the last visit.  Atopic dermatitis Stable with no flares.   Assessment and Plan: Anne Jefferson is a 44 y.o. female with: Perennial and seasonal allergic rhinitis Past history - started AIT on 04/22/2019 (M + G/DM/C/D). Interim history - stayed with cat/dog indoors on vacation and developed headaches. Not taking any daily medications. Has some localized reactions after injections.   Discussed at length about getting repeat skin testing to cat/dog or bloodwork. This will not predict clinical symptoms especially when exposed to pet dander in a prolonged manner. The additional information also won't  change medical regimen. She is still building up on her injections.   Continue allergy injections.  Take an antihistamines such as Xyzal on the days of your allergy injections to help with the localized itching/swelling.  Once reached maintenance dose for about 1 year then can try to spend some time indoors with cats/dogs and see if they trigger her symptoms.   Continue environmental control measures.  May use Flonase (fluticasone) OR Nasonex nasal spray 1 spray per nostril twice a day as needed for nasal congestion.   Nasal saline spray (i.e., Simply Saline) or nasal saline lavage (i.e., NeilMed) is recommended as needed and prior to medicated nasal sprays.  Atopic dermatitis Well-controlled.  Continue proper skin care.  Mild intermittent asthma Well-controlled. No inhaler use.  . Daily controller medication(s): none.  . During upper respiratory infections/asthma flares: Start Flovent 166mcg 2 puffs twice a day for 1-2 weeks until symptoms back to baseline.  . May use albuterol rescue inhaler 2 puffs every 4 to 6 hours as needed for shortness of breath, chest tightness, coughing, and wheezing. May use albuterol rescue inhaler 2 puffs 5 to 15 minutes prior to strenuous physical activities. Monitor frequency of use. . Get spirometry at next visit.    Return in about 1 year (around 02/24/2021).  Diagnostics: None.  Medication List:  Current Outpatient Medications  Medication Sig Dispense Refill  . EPINEPHrine (AUVI-Q) 0.3 mg/0.3 mL IJ SOAJ injection Inject 0.3 mLs (0.3 mg total) into the muscle as needed for anaphylaxis. 2 each 1  . Azelastine HCl 0.15 % SOLN Place 2 sprays into both nostrils 2 (two) times daily as needed. (Patient  not taking: Reported on 02/25/2020) 30 mL 5  . Carbinoxamine Maleate 4 MG TABS Take 1 tablet (4 mg total) by mouth every 6 (six) hours as needed. (Patient not taking: Reported on 02/25/2020) 90 tablet 0  . fluticasone (FLOVENT HFA) 110 MCG/ACT inhaler Inhale 2  puffs into the lungs 2 (two) times daily. (Patient not taking: Reported on 04/01/2019) 1 Inhaler 5  . levocetirizine (XYZAL) 5 MG tablet Take 1 tablet (5 mg total) by mouth every evening. (Patient not taking: Reported on 02/25/2020) 30 tablet 5  . mometasone (NASONEX) 50 MCG/ACT nasal spray Place 1 spray into the nose daily.  (Patient not taking: Reported on 02/25/2020)     No current facility-administered medications for this visit.   Allergies: Allergies  Allergen Reactions  . Promethazine Other (See Comments)    Hallucinations  . Theophyllines Nausea And Vomiting   I reviewed her past medical history, social history, family history, and environmental history and no significant changes have been reported from her previous visit.  Review of Systems  Constitutional: Negative for appetite change, chills, fever and unexpected weight change.  HENT: Negative for congestion and rhinorrhea.   Eyes: Negative for itching.  Respiratory: Negative for cough, chest tightness, shortness of breath and wheezing.   Gastrointestinal: Negative for abdominal pain.  Skin: Negative for rash.  Allergic/Immunologic: Positive for environmental allergies.  Neurological: Negative for headaches.   Objective: BP 96/78 (BP Location: Right Arm, Cuff Size: Normal)   Pulse 61   Resp 14   Ht 5\' 3"  (1.6 m)   SpO2 97%   BMI 31.44 kg/m  Body mass index is 31.44 kg/m. Physical Exam Vitals and nursing note reviewed.  Constitutional:      Appearance: Normal appearance. She is well-developed.  HENT:     Head: Normocephalic and atraumatic.     Right Ear: Tympanic membrane and external ear normal.     Left Ear: Tympanic membrane and external ear normal.     Nose: Nose normal.     Mouth/Throat:     Mouth: Mucous membranes are moist.     Pharynx: Oropharynx is clear.  Eyes:     Conjunctiva/sclera: Conjunctivae normal.  Cardiovascular:     Rate and Rhythm: Normal rate and regular rhythm.     Heart sounds: Normal  heart sounds. No murmur heard.   Pulmonary:     Effort: Pulmonary effort is normal.     Breath sounds: Normal breath sounds. No wheezing, rhonchi or rales.  Musculoskeletal:     Cervical back: Neck supple.  Skin:    General: Skin is warm.     Findings: No rash.  Neurological:     Mental Status: She is alert and oriented to person, place, and time.  Psychiatric:        Behavior: Behavior normal.    Previous notes and tests were reviewed. The plan was reviewed with the patient/family, and all questions/concerned were addressed.  It was my pleasure to see Anne Jefferson today and participate in her care. Please feel free to contact me with any questions or concerns.  Sincerely,  Rexene Alberts, DO Allergy & Immunology  [facility] office: [phone] [facility] office: [employer] office: [phone]

## 2020-02-25 ENCOUNTER — Encounter: Payer: Self-pay | Admitting: Allergy

## 2020-02-25 ENCOUNTER — Ambulatory Visit (INDEPENDENT_AMBULATORY_CARE_PROVIDER_SITE_OTHER): Payer: 59 | Admitting: Allergy

## 2020-02-25 VITALS — BP 96/78 | HR 61 | Resp 14 | Ht 63.0 in

## 2020-02-25 DIAGNOSIS — J4520 Mild intermittent asthma, uncomplicated: Secondary | ICD-10-CM

## 2020-02-25 DIAGNOSIS — J3089 Other allergic rhinitis: Secondary | ICD-10-CM

## 2020-02-25 DIAGNOSIS — L2089 Other atopic dermatitis: Secondary | ICD-10-CM | POA: Diagnosis not present

## 2020-02-25 NOTE — Assessment & Plan Note (Signed)
Well-controlled. No inhaler use.  . Daily controller medication(s): none.  . During upper respiratory infections/asthma flares: Start Flovent 156mcg 2 puffs twice a day for 1-2 weeks until symptoms back to baseline.  . May use albuterol rescue inhaler 2 puffs every 4 to 6 hours as needed for shortness of breath, chest tightness, coughing, and wheezing. May use albuterol rescue inhaler 2 puffs 5 to 15 minutes prior to strenuous physical activities. Monitor frequency of use. . Get spirometry at next visit.

## 2020-02-25 NOTE — Assessment & Plan Note (Signed)
Well-controlled.  Continue proper skin care. 

## 2020-02-25 NOTE — Assessment & Plan Note (Addendum)
Past history - started AIT on 04/22/2019 (M + G/DM/C/D). Interim history - stayed with cat/dog indoors on vacation and developed headaches. Not taking any daily medications. Has some localized reactions after injections.   Discussed at length about getting repeat skin testing to cat/dog or bloodwork. This will not predict clinical symptoms especially when exposed to pet dander in a prolonged manner. The additional information also won't change medical regimen. She is still building up on her injections.   Continue allergy injections.  Take an antihistamines such as Xyzal on the days of your allergy injections to help with the localized itching/swelling.  Once reached maintenance dose for about 1 year then can try to spend some time indoors with cats/dogs and see if they trigger her symptoms.   Continue environmental control measures.  May use Flonase (fluticasone) OR Nasonex nasal spray 1 spray per nostril twice a day as needed for nasal congestion.   Nasal saline spray (i.e., Simply Saline) or nasal saline lavage (i.e., NeilMed) is recommended as needed and prior to medicated nasal sprays.

## 2020-02-25 NOTE — Patient Instructions (Addendum)
Perennial and seasonal allergic rhinitis  Continue allergy injections.  Take an antihistamines such as Xyzal on the days of your allergy injections to help with the localized itching/swelling.  Continue environmental control measures.  May use Flonase (fluticasone) OR Nasonex nasal spray 1 spray per nostril twice a day as needed for nasal congestion.   Nasal saline spray (i.e., Simply Saline) or nasal saline lavage (i.e., NeilMed) is recommended as needed and prior to medicated nasal sprays.  Mild intermittent asthma . Daily controller medication(s): none.  . During upper respiratory infections/asthma flares: Start Flovent 134mcg 2 puffs twice a day for 1-2 weeks until symptoms back to baseline.  . May use albuterol rescue inhaler 2 puffs every 4 to 6 hours as needed for shortness of breath, chest tightness, coughing, and wheezing. May use albuterol rescue inhaler 2 puffs 5 to 15 minutes prior to strenuous physical activities. Monitor frequency of use.  . Asthma control goals:  o Full participation in all desired activities (may need albuterol before activity) o Albuterol use two times or less a week on average (not counting use with activity) o Cough interfering with sleep two times or less a month o Oral steroids no more than once a year o No hospitalizations  Atopic dermatitis  Continue proper skin care.  Follow up in 1 year or sooner if needed.  Reducing Pollen Exposure . Pollen seasons: trees (spring), grass (summer) and ragweed/weeds (fall). Marland Kitchen Keep windows closed in your home and car to lower pollen exposure.  [name] in the bedroom and throughout the house if possible.  . Avoid going out in dry windy days - especially early morning. . Pollen counts are highest between 5 - 10 AM and on dry, hot and windy days.  . Save outside activities for late afternoon or after a heavy rain, when pollen levels are lower.  . Avoid mowing of grass if you have grass  pollen allergy. Marland Kitchen Be aware that pollen can also be transported indoors on people and pets.  . Dry your clothes in an automatic dryer rather than hanging them outside where they might collect pollen.  . Rinse hair and eyes before bedtime. Pet Allergen Avoidance: . Contrary to popular opinion, there are no "hypoallergenic" breeds of dogs or cats. That is because people are not allergic to an animal's hair, but to an allergen found in the animal's saliva, dander (dead skin flakes) or urine. Pet allergy symptoms typically occur within minutes. For some people, symptoms can build up and become most severe 8 to 12 hours after contact with the animal. People with severe allergies can experience reactions in public places if dander has been transported on the pet owners' clothing. Marland Kitchen Keeping an animal outdoors is only a partial solution, since homes with pets in the yard still have higher concentrations of animal allergens. . Before getting a pet, ask your allergist to determine if you are allergic to animals. If your pet is already considered part of your family, try to minimize contact and keep the pet out of the bedroom and other rooms where you spend a great deal of time. . As with dust mites, vacuum carpets often or replace carpet with a hardwood floor, tile or linoleum. . High-efficiency particulate air (HEPA) cleaners can reduce allergen levels over time. . While dander and saliva are the source of cat and dog allergens, urine is the source of allergens from rabbits, hamsters, mice and [place] pigs; so ask a non-allergic family member to  clean the animal's cage. . If you have a pet allergy, talk to your allergist about the potential for allergy immunotherapy (allergy shots). This strategy can often provide long-term relief. Control of House Dust Mite Allergen . Dust mite allergens are a common trigger of allergy and asthma symptoms. While they can be found throughout the house, these microscopic creatures  thrive in warm, humid environments such as bedding, upholstered furniture and carpeting. . Because so much time is spent in the bedroom, it is essential to reduce mite levels there.  . Encase pillows, mattresses, and box springs in special allergen-proof fabric covers or airtight, zippered plastic covers.  . Bedding should be washed weekly in hot water (130 F) and dried in a hot dryer. Allergen-proof covers are available for comforters and pillows that can't be regularly washed.  Wendee Copp the allergy-proof covers every few months. Minimize clutter in the bedroom. Keep pets out of the bedroom.  Marland Kitchen Keep humidity less than 50% by using a dehumidifier or air conditioning. You can buy a humidity measuring device called a hygrometer to monitor this.  . If possible, replace carpets with hardwood, linoleum, or washable area rugs. If that's not possible, vacuum frequently with a vacuum that has a HEPA filter. . Remove all upholstered furniture and non-washable window drapes from the bedroom. . Remove all non-washable stuffed toys from the bedroom.  Wash stuffed toys weekly. Mold Control . Mold and fungi can grow on a variety of surfaces provided certain temperature and moisture conditions exist.  . Outdoor molds grow on plants, decaying vegetation and soil. The major outdoor mold, Alternaria and Cladosporium, are found in very high numbers during hot and dry conditions. Generally, a late summer - fall peak is seen for common outdoor fungal spores. Rain will temporarily lower outdoor mold spore count, but counts rise rapidly when the rainy period ends. . The most important indoor molds are Aspergillus and Penicillium. Dark, humid and poorly ventilated basements are ideal sites for mold growth. The next most common sites of mold growth are the bathroom and the kitchen. Outdoor (Seasonal) Mold Control . Use air conditioning and keep windows closed. . Avoid exposure to decaying vegetation. Marland Kitchen Avoid leaf  raking. . Avoid grain handling. . Consider wearing a face mask if working in moldy areas.  Indoor (Perennial) Mold Control  . Maintain humidity below 50%. . Get rid of mold growth on hard surfaces with water, detergent and, if necessary, 5% bleach (do not mix with other cleaners). Then dry the area completely. If mold covers an area more than 10 square feet, consider hiring an indoor environmental professional. . For clothing, washing with soap and water is best. If moldy items cannot be cleaned and dried, throw them away. . Remove sources e.g. contaminated carpets. . Repair and seal leaking roofs or pipes. Using dehumidifiers in damp basements may be helpful, but empty the water and clean units regularly to prevent mildew from forming. All rooms, especially basements, bathrooms and kitchens, require ventilation and cleaning to deter mold and mildew growth. Avoid carpeting on concrete or damp floors, and storing items in damp areas.  Skin care recommendations  Bath time: . Always use lukewarm water. AVOID very hot or cold water. Marland Kitchen Keep bathing time to 5-10 minutes. . Do NOT use bubble bath. . Use a mild soap and use just enough to wash the dirty areas. . Do NOT scrub skin vigorously.  . After bathing, pat dry your skin with a towel. Do NOT rub  or scrub the skin.  Moisturizers and prescriptions:  . ALWAYS apply moisturizers immediately after bathing (within 3 minutes). This helps to lock-in moisture. . Use the moisturizer several times a day over the whole body. Kermit Balo summer moisturizers include: Aveeno, CeraVe, Cetaphil. Kermit Balo winter moisturizers include: Aquaphor, Vaseline, Cerave, Cetaphil, Eucerin, Vanicream. . When using moisturizers along with medications, the moisturizer should be applied about one hour after applying the medication to prevent diluting effect of the medication or moisturize around where you applied the medications. When not using medications, the moisturizer can be  continued twice daily as maintenance.  Laundry and clothing: . Avoid laundry products with added color or perfumes. . Use unscented hypo-allergenic laundry products such as Tide free, Cheer free & gentle, and All free and clear.  . If the skin still seems dry or sensitive, you can try double-rinsing the clothes. . Avoid tight or scratchy clothing such as wool. . Do not use fabric softeners or dyer sheets.

## 2020-03-04 ENCOUNTER — Ambulatory Visit (INDEPENDENT_AMBULATORY_CARE_PROVIDER_SITE_OTHER): Payer: 59

## 2020-03-04 ENCOUNTER — Other Ambulatory Visit: Payer: Self-pay

## 2020-03-04 DIAGNOSIS — J309 Allergic rhinitis, unspecified: Secondary | ICD-10-CM

## 2020-03-04 NOTE — Progress Notes (Signed)
EXP 03/04/21

## 2020-03-05 DIAGNOSIS — J3089 Other allergic rhinitis: Secondary | ICD-10-CM | POA: Diagnosis not present

## 2020-03-09 ENCOUNTER — Other Ambulatory Visit: Payer: Self-pay

## 2020-03-09 ENCOUNTER — Ambulatory Visit (INDEPENDENT_AMBULATORY_CARE_PROVIDER_SITE_OTHER): Payer: 59

## 2020-03-09 DIAGNOSIS — J309 Allergic rhinitis, unspecified: Secondary | ICD-10-CM

## 2020-03-16 ENCOUNTER — Ambulatory Visit (INDEPENDENT_AMBULATORY_CARE_PROVIDER_SITE_OTHER): Payer: 59

## 2020-03-16 ENCOUNTER — Other Ambulatory Visit: Payer: Self-pay

## 2020-03-16 DIAGNOSIS — J309 Allergic rhinitis, unspecified: Secondary | ICD-10-CM | POA: Diagnosis not present

## 2020-03-22 DIAGNOSIS — H4912 Fourth [trochlear] nerve palsy, left eye: Secondary | ICD-10-CM | POA: Diagnosis not present

## 2020-03-22 DIAGNOSIS — Z83511 Family history of glaucoma: Secondary | ICD-10-CM | POA: Diagnosis not present

## 2020-03-22 DIAGNOSIS — H5213 Myopia, bilateral: Secondary | ICD-10-CM | POA: Diagnosis not present

## 2020-03-25 ENCOUNTER — Other Ambulatory Visit: Payer: Self-pay

## 2020-03-25 ENCOUNTER — Ambulatory Visit (INDEPENDENT_AMBULATORY_CARE_PROVIDER_SITE_OTHER): Payer: 59

## 2020-03-25 DIAGNOSIS — J309 Allergic rhinitis, unspecified: Secondary | ICD-10-CM

## 2020-03-30 ENCOUNTER — Ambulatory Visit (INDEPENDENT_AMBULATORY_CARE_PROVIDER_SITE_OTHER): Payer: 59

## 2020-03-30 ENCOUNTER — Other Ambulatory Visit: Payer: Self-pay

## 2020-03-30 DIAGNOSIS — J309 Allergic rhinitis, unspecified: Secondary | ICD-10-CM

## 2020-04-08 ENCOUNTER — Ambulatory Visit (INDEPENDENT_AMBULATORY_CARE_PROVIDER_SITE_OTHER): Payer: 59

## 2020-04-08 ENCOUNTER — Other Ambulatory Visit: Payer: Self-pay

## 2020-04-08 DIAGNOSIS — J309 Allergic rhinitis, unspecified: Secondary | ICD-10-CM | POA: Diagnosis not present

## 2020-04-10 ENCOUNTER — Other Ambulatory Visit: Payer: Self-pay | Admitting: Internal Medicine

## 2020-04-10 ENCOUNTER — Other Ambulatory Visit: Payer: Self-pay

## 2020-04-10 ENCOUNTER — Ambulatory Visit
Admission: RE | Admit: 2020-04-10 | Discharge: 2020-04-10 | Disposition: A | Discharge status: Home / Self Care | Payer: 59 | Source: Ambulatory Visit | Attending: Internal Medicine | Admitting: Internal Medicine

## 2020-04-10 DIAGNOSIS — Z1231 Encounter for screening mammogram for malignant neoplasm of breast: Secondary | ICD-10-CM | POA: Diagnosis not present

## 2020-04-10 IMAGING — MG DIGITAL SCREENING BILAT W/ TOMO W/ CAD
8 series · 8 of 24 positions shown · non-contrast
Comparison: Previous exam(s).

CLINICAL DATA: Screening.

EXAM:
DIGITAL SCREENING BILATERAL MAMMOGRAM WITH TOMO AND CAD

[R CC synth-2D]
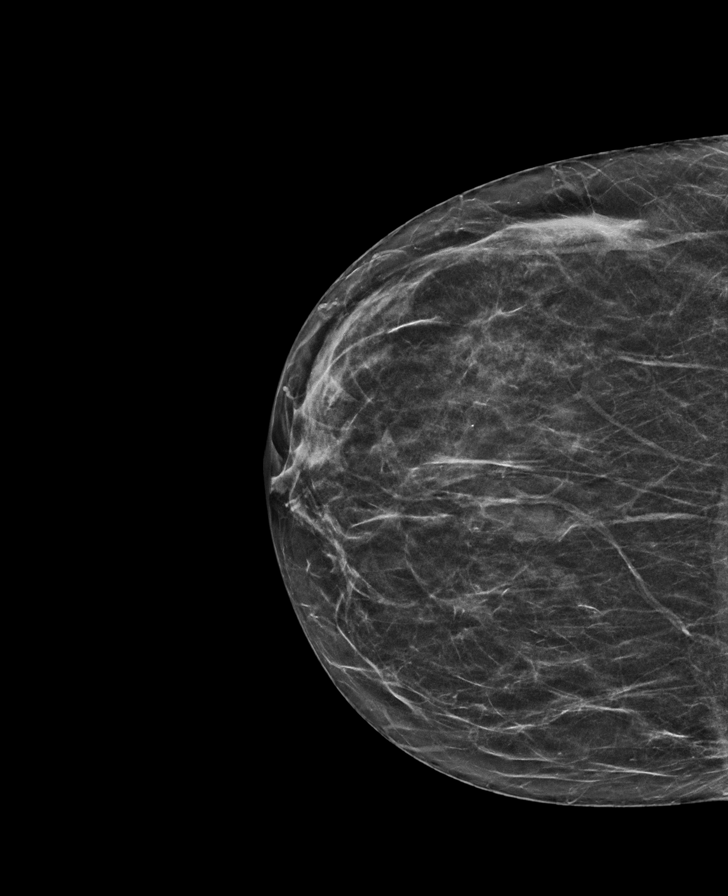

[L CC synth-2D]
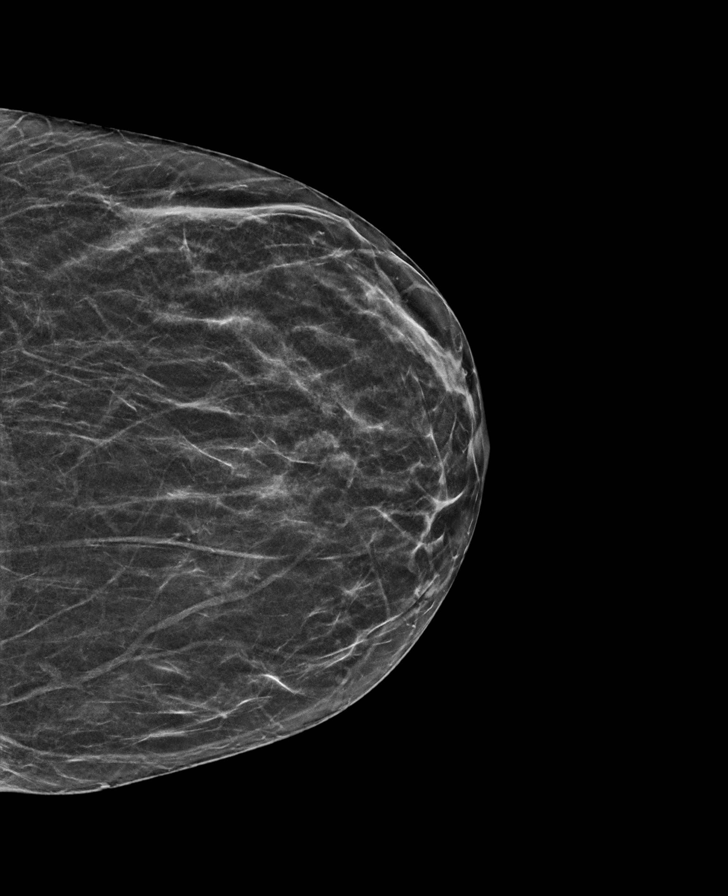

[L MLO synth-2D]
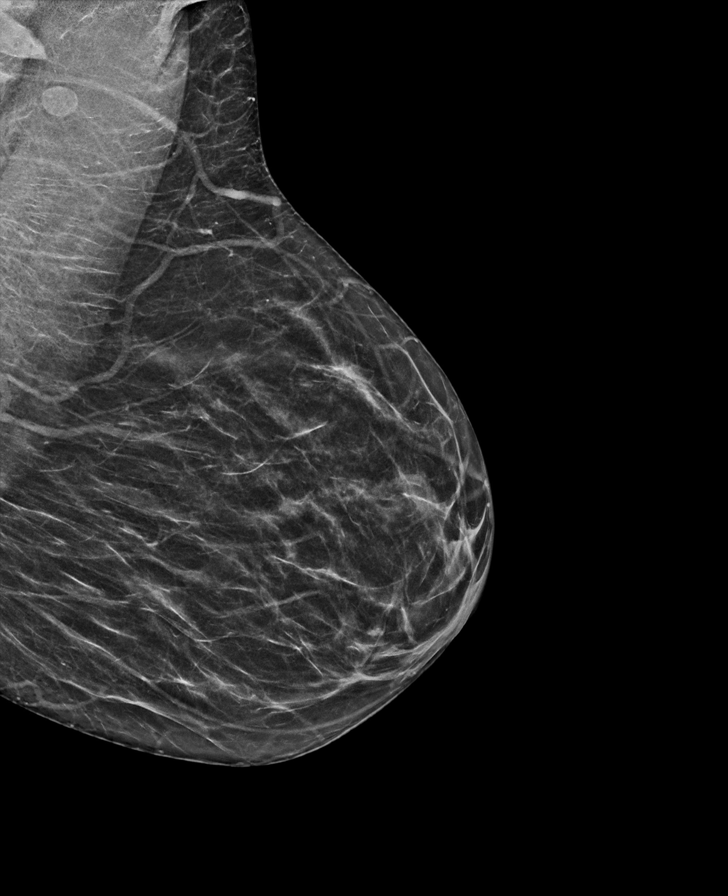

[R MLO synth-2D]
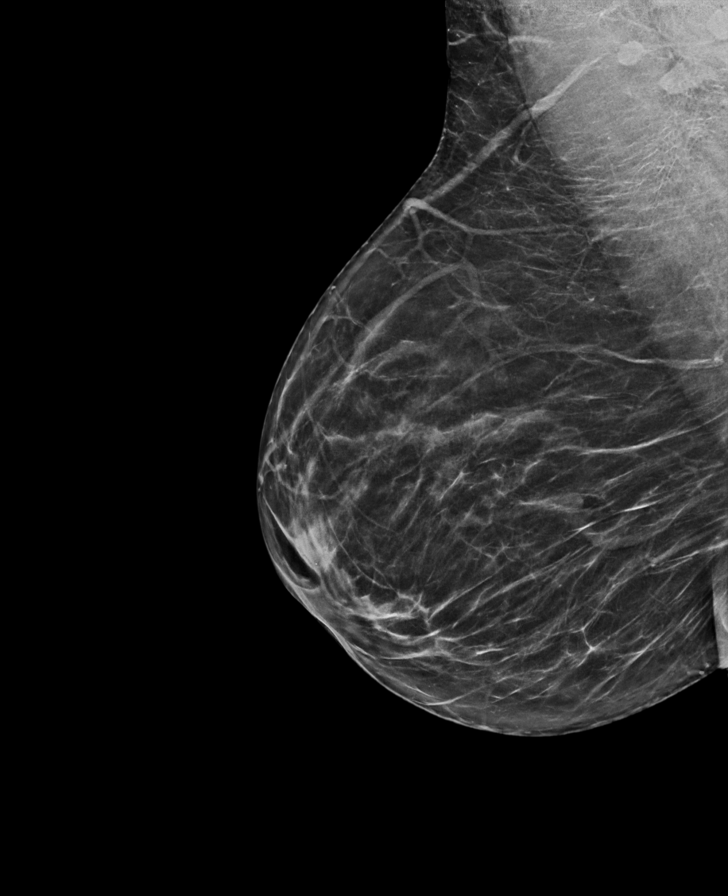

[R CC tomo · tomo slice 25/50.0]
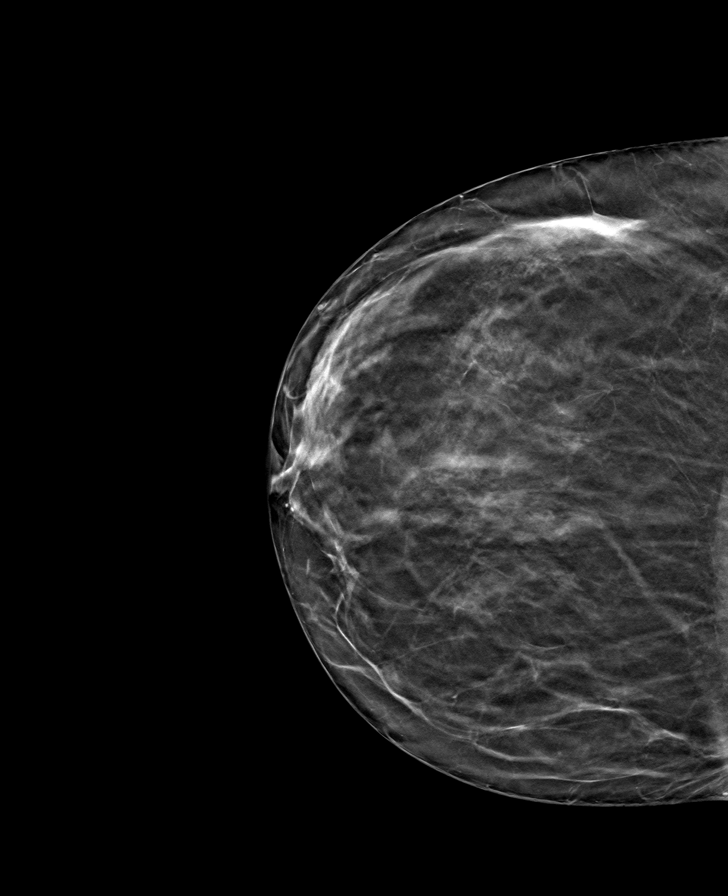

[R MLO tomo · tomo slice 32/63.0]
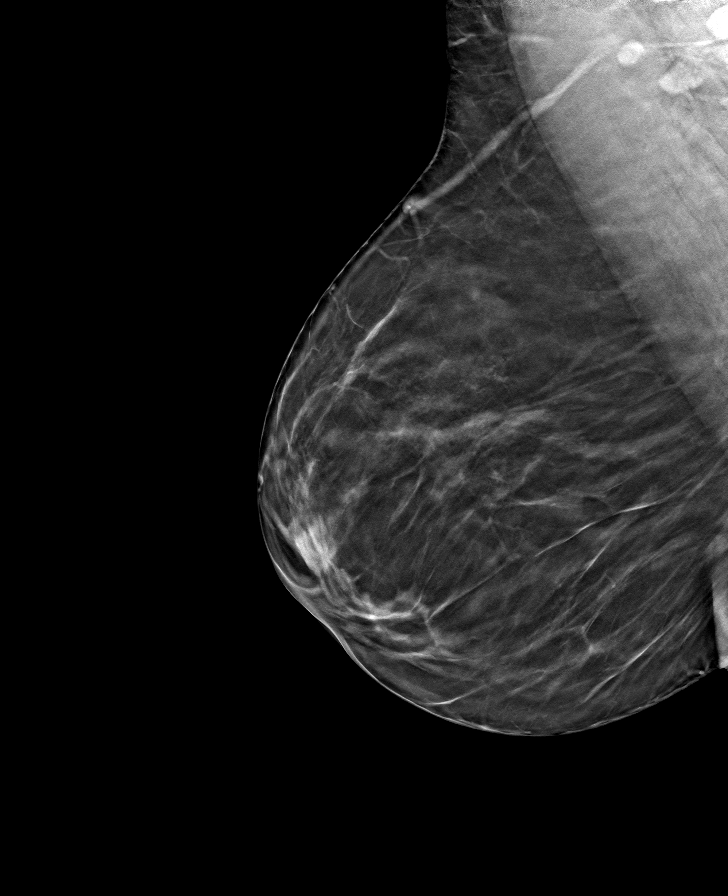

[L CC tomo · tomo slice 25/49.0]
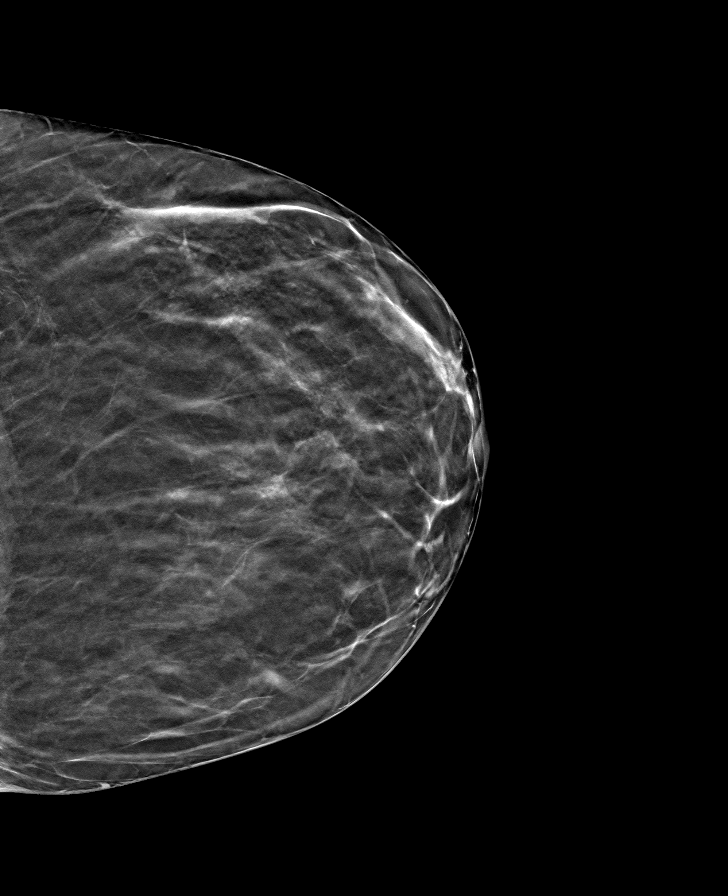

[L MLO tomo · tomo slice 30/59.0]
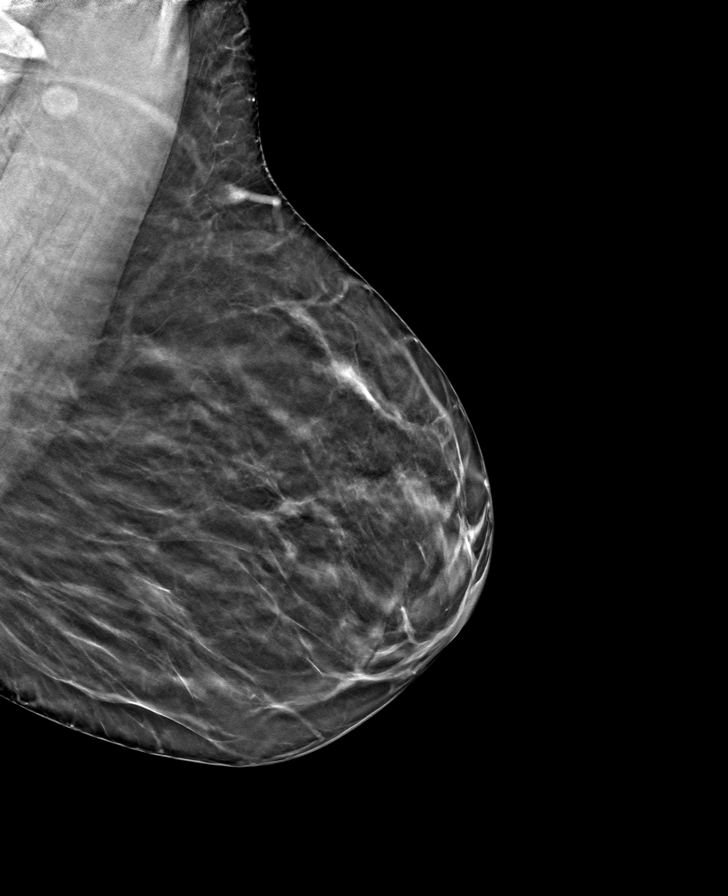

[8 of 24 positions shown; findings below may reference images not displayed]

ACR Breast Density Category b: There are scattered areas of
fibroglandular density.
FINDINGS: There are no findings suspicious for malignancy. Images were
processed with CAD.
IMPRESSION: No mammographic evidence of malignancy. A result letter of this
screening mammogram will be mailed directly to the patient.

RECOMMENDATION:
Screening mammogram in one year. (Code:[TQ])

BI-RADS CATEGORY  1: Negative.

## 2020-04-13 ENCOUNTER — Telehealth: Payer: Self-pay

## 2020-04-13 ENCOUNTER — Ambulatory Visit (INDEPENDENT_AMBULATORY_CARE_PROVIDER_SITE_OTHER): Payer: 59

## 2020-04-13 ENCOUNTER — Other Ambulatory Visit: Payer: Self-pay

## 2020-04-13 DIAGNOSIS — J309 Allergic rhinitis, unspecified: Secondary | ICD-10-CM

## 2020-04-13 NOTE — Telephone Encounter (Signed)
Prednisone, 20 mg x 4 days, 10 mg x1 day, then stop.

## 2020-04-13 NOTE — Telephone Encounter (Signed)
Patient came in to get her allergy injections and informed me that she will be going out of town again to visit family who have dogs. Patient stated that taking her antihistamine is never enough. She is wondering if she could have get some prednisone again to help reduce or prevent allergy flares while she is there for a week. Please advise.

## 2020-04-14 NOTE — Telephone Encounter (Signed)
Left a message for patient to call me in the [place] office today. Will need to see if she would like to pick the prednisone up in our [facility] office in the morning or if she would like [place] to send it in to the pharmacy.

## 2020-04-15 MED ORDER — PREDNISONE 10 MG PO TABS: ORAL_TABLET | ORAL | 0 refills | Status: DC

## 2020-04-15 NOTE — Telephone Encounter (Signed)
Spoke with patient and she was at the airport [place] ready to go out of town. Patient will call me back and let me know of a pharmacy I can send the prednisone to.

## 2020-04-15 NOTE — Telephone Encounter (Signed)
Spoke with patient and she stated the pharmacy she would like [facility] sent to [facility] [place] [address]. Prescription has been sent to the requested pharmacy.

## 2020-04-15 NOTE — Addendum Note (Signed)
Addended by: Herbie Drape on: 04/15/2020 01:33 PM   Modules accepted: Orders

## 2020-04-22 ENCOUNTER — Ambulatory Visit (INDEPENDENT_AMBULATORY_CARE_PROVIDER_SITE_OTHER): Payer: 59

## 2020-04-22 ENCOUNTER — Other Ambulatory Visit: Payer: Self-pay

## 2020-04-22 DIAGNOSIS — J309 Allergic rhinitis, unspecified: Secondary | ICD-10-CM

## 2020-05-06 ENCOUNTER — Ambulatory Visit (INDEPENDENT_AMBULATORY_CARE_PROVIDER_SITE_OTHER): Payer: 59

## 2020-05-06 ENCOUNTER — Other Ambulatory Visit: Payer: Self-pay

## 2020-05-06 DIAGNOSIS — J309 Allergic rhinitis, unspecified: Secondary | ICD-10-CM | POA: Diagnosis not present

## 2020-05-11 ENCOUNTER — Other Ambulatory Visit: Payer: Self-pay

## 2020-05-11 ENCOUNTER — Ambulatory Visit (INDEPENDENT_AMBULATORY_CARE_PROVIDER_SITE_OTHER): Payer: 59

## 2020-05-11 DIAGNOSIS — J309 Allergic rhinitis, unspecified: Secondary | ICD-10-CM

## 2020-05-25 ENCOUNTER — Other Ambulatory Visit: Payer: Self-pay

## 2020-05-25 ENCOUNTER — Ambulatory Visit (INDEPENDENT_AMBULATORY_CARE_PROVIDER_SITE_OTHER): Payer: 59

## 2020-05-25 DIAGNOSIS — J309 Allergic rhinitis, unspecified: Secondary | ICD-10-CM | POA: Diagnosis not present

## 2020-06-01 ENCOUNTER — Ambulatory Visit (INDEPENDENT_AMBULATORY_CARE_PROVIDER_SITE_OTHER): Payer: 59

## 2020-06-01 ENCOUNTER — Other Ambulatory Visit: Payer: Self-pay

## 2020-06-01 DIAGNOSIS — J309 Allergic rhinitis, unspecified: Secondary | ICD-10-CM | POA: Diagnosis not present

## 2020-06-02 DIAGNOSIS — J3089 Other allergic rhinitis: Secondary | ICD-10-CM | POA: Diagnosis not present

## 2020-06-02 NOTE — Progress Notes (Signed)
Vials exp 06-02-21

## 2020-06-10 ENCOUNTER — Other Ambulatory Visit: Payer: Self-pay

## 2020-06-10 ENCOUNTER — Ambulatory Visit (INDEPENDENT_AMBULATORY_CARE_PROVIDER_SITE_OTHER): Payer: 59

## 2020-06-10 DIAGNOSIS — J309 Allergic rhinitis, unspecified: Secondary | ICD-10-CM

## 2020-06-14 ENCOUNTER — Other Ambulatory Visit: Payer: Self-pay

## 2020-06-14 ENCOUNTER — Telehealth: Payer: Self-pay

## 2020-06-14 MED ORDER — ALBUTEROL SULFATE HFA 108 (90 BASE) MCG/ACT IN AERS: 2.0000 | INHALATION_SPRAY | RESPIRATORY_TRACT | 5 refills | Status: DC | PRN

## 2020-06-14 MED ORDER — FLOVENT HFA 110 MCG/ACT IN AERO: 2.0000 | INHALATION_SPRAY | Freq: Two times a day (BID) | RESPIRATORY_TRACT | 1 refills | Status: DC

## 2020-06-14 MED ORDER — CARBINOXAMINE MALEATE 4 MG PO TABS
4.0000 mg | ORAL_TABLET | Freq: Four times a day (QID) | ORAL | 3 refills | Status: DC | PRN
Start: 2020-06-14 — End: 2020-07-29

## 2020-06-14 NOTE — Telephone Encounter (Signed)
Patient called stating her asthma is flared and all her inhalers are expired. Patient is wondering if these refills can be sent into her pharmacy. Patient is traveling for work and will be around the [place] on [place] for the next 30 mins.   If its after this time please send meds to her regular pharmacy- [facility]

## 2020-06-14 NOTE — Telephone Encounter (Signed)
Called and spoke to patient to inform her that her refills have been sent in. Patient verbalized understanding.

## 2020-06-24 ENCOUNTER — Other Ambulatory Visit: Payer: Self-pay

## 2020-06-24 ENCOUNTER — Ambulatory Visit (INDEPENDENT_AMBULATORY_CARE_PROVIDER_SITE_OTHER): Payer: 59

## 2020-06-24 DIAGNOSIS — J309 Allergic rhinitis, unspecified: Secondary | ICD-10-CM

## 2020-06-27 DIAGNOSIS — S93601A Unspecified sprain of right foot, initial encounter: Secondary | ICD-10-CM | POA: Diagnosis not present

## 2020-07-01 ENCOUNTER — Ambulatory Visit (INDEPENDENT_AMBULATORY_CARE_PROVIDER_SITE_OTHER): Payer: 59

## 2020-07-01 ENCOUNTER — Ambulatory Visit: Payer: 59

## 2020-07-01 ENCOUNTER — Other Ambulatory Visit: Payer: Self-pay

## 2020-07-01 ENCOUNTER — Ambulatory Visit (INDEPENDENT_AMBULATORY_CARE_PROVIDER_SITE_OTHER): Payer: 59 | Admitting: Podiatry

## 2020-07-01 DIAGNOSIS — M21619 Bunion of unspecified foot: Secondary | ICD-10-CM | POA: Diagnosis not present

## 2020-07-01 DIAGNOSIS — J309 Allergic rhinitis, unspecified: Secondary | ICD-10-CM

## 2020-07-01 DIAGNOSIS — M21861 Other specified acquired deformities of right lower leg: Secondary | ICD-10-CM

## 2020-07-01 DIAGNOSIS — M2011 Hallux valgus (acquired), right foot: Secondary | ICD-10-CM

## 2020-07-01 DIAGNOSIS — M21611 Bunion of right foot: Secondary | ICD-10-CM

## 2020-07-01 DIAGNOSIS — M19272 Secondary osteoarthritis, left ankle and foot: Secondary | ICD-10-CM

## 2020-07-01 DIAGNOSIS — M2012 Hallux valgus (acquired), left foot: Secondary | ICD-10-CM | POA: Diagnosis not present

## 2020-07-01 DIAGNOSIS — M19271 Secondary osteoarthritis, right ankle and foot: Secondary | ICD-10-CM

## 2020-07-01 DIAGNOSIS — M21862 Other specified acquired deformities of left lower leg: Secondary | ICD-10-CM

## 2020-07-01 DIAGNOSIS — M21612 Bunion of left foot: Secondary | ICD-10-CM | POA: Diagnosis not present

## 2020-07-01 DIAGNOSIS — M216X2 Other acquired deformities of left foot: Secondary | ICD-10-CM

## 2020-07-01 DIAGNOSIS — M216X1 Other acquired deformities of right foot: Secondary | ICD-10-CM

## 2020-07-01 NOTE — Progress Notes (Signed)
Subjective:  Patient ID: Anne Jefferson, female    DOB: [birth year],  MRN: [id number]  Chief Complaint  Patient presents with  . Bunions    PT STATES SHE HAS BILATERAL BUNION PAIN    44 y.o. female presents with the above complaint. History confirmed with patient.  She has a history of bunion surgery on the left foot.  She has a bunion on the right foot now that is painful.  She also has bone spurs on the top of the mid arch which are also uncomfortable.  She notes this causes some nerve discomfort.  Some pain in the joint of the big toe on the left foot too Objective:  Physical Exam: warm, good capillary refill, no trophic changes or ulcerative lesions, normal DP and PT pulses and normal sensory exam. Left Foot: Well-healed surgical scar, mild tenderness over the first MTPJ.  Over the dorsal TMT there is bony prominence which is slightly tender to palpation. Right Foot: Moderate hallux valgus with large dorsomedial eminence, bony prominence over the first TMT which slightly tender to palpation.    Radiographs: X-ray of both feet: On the right foot there is hallux valgus with large increase in the intermetatarsal angle and deviation of the sesamoid complex, there is bony prominence over the first TMT with maintained joint space.  On the left foot there is previous hallux valgus correction with Kirschner wire fixation, mild narrowing of the first MTPJ.  Bony prominence of the first TMT with maintained joint space. Assessment:   1. Bunion   2. Hallux valgus with bunions, right   3. Hallux valgus with bunions, left   4. Other secondary osteoarthritis of left foot   5. Other secondary osteoarthritis of right foot   6. Gastrocnemius equinus of left lower extremity   7. Gastrocnemius equinus of right lower extremity      Plan:  Patient was evaluated and treated and all questions answered.   We discussed what surgical options are possible to treat her left and right foot pain.   For the dorsal spurring over the TMT joints, usually counter this with osteoarthritis which likely is the issue here as well.  I suspect most of the pain is from compression over the area of the bony spur from shoe gear.  I would like to order bilateral MRI to evaluate for the quality of the remaining articular surface of the first and second tarsometatarsal joints to determine if arthrodesis would be necessary for if simple exostectomy of the dorsal spurring would be sufficient for both feet.  Additionally for the left foot she does have some pain in mid range of motion of the first MTPJ and I like to evaluate this for osteoarthritis as well to guide further treatment options.  In the interim recommend supporting with longitudinal orthoses, she purchase Power Steps here today.  Concerning her bunion on the right foot she has tried changes in shoe gear which is not been helpful.  She is interested in surgical correction.  We discussed all risks including but not limited to: pain, swelling, infection, scar, numbness which may be temporary or permanent, chronic pain, stiffness, nerve pain or damage, wound healing problems, bone healing problems including delayed or non-union and recurrence. Specifically we discussed the following procedures: Distal metatarsal chevron osteotomy with possible Akin osteotomy through a small incision under fluoroscopy.  We will plan for this likely in the spring.  We discussed the recuperation period and the period of weightbearing in a surgical shoe and how  long she will need to be out of work.  She works as a Marine scientist at [employer].  She will return in 3 to 4 weeks following MRI to review and schedule surgery.    No follow-ups on file.

## 2020-07-01 NOTE — Patient Instructions (Addendum)
Expect 4-6 weeks off work for recovery. You will be walking in a post op shoe or boot after surgery during this period. Will need to ice and elevate. We will discuss what to do about the arthritis after the MRIs which I will order from [facility], they will contact you to schedule.  We will discuss calf muscle lengthening at your next visit    [place] surgery is done to remove a bunion, which is a bony bump on the big toe joint, on the inner side of the foot. A bunion can develop over time when pressure turns the big toe and joint toward the other toes. Bunions may be caused by wearing shoes that are too tight or narrow. They can also be caused by conditions such as rheumatoid arthritis and flat feet. You may need bunion surgery if your bunion is very large or painful or it affects your ability to walk. Tell a health care provider about:  Any allergies you have.  All medicines you are taking, including vitamins, herbs, eye drops, creams, and over-the-counter medicines.  Any problems you or family members have had with anesthetic medicines.  Any blood disorders you have.  Any surgeries you have had.  Any medical conditions you have.  Whether you are pregnant or may be pregnant. What are the risks? Generally, this is a safe procedure. However, problems may occur, including:  Infection.  Bleeding or blood clots.  Allergic reactions to medicines.  Nerve damage.  Pain.  Numbness, stiffness, or arthritis in the toe.  Return of the bunion.  Failure of the bone to heal completely after surgery.  Failure of the surgery to relieve symptoms. What happens before the procedure? General instructions  Ask your health care provider about: ? Changing or stopping your regular medicines. This is especially important if you are taking diabetes medicines or blood thinners. ? Taking medicines such as aspirin and ibuprofen. These medicines can thin your blood. Do  not take these medicines unless your health care provider tells you to take them. ? Taking over-the-counter medicines, vitamins, herbs, and supplements.  Do not drink alcohol before the procedure as told by your health care provider.  Do not use any products that contain nicotine or tobacco, such as cigarettes and e-cigarettes. These can delay bone healing. If you need help quitting, ask your health care provider.  Plan to have someone take you home from the hospital or clinic.  Plan to have a responsible adult care for you for at least 24 hours after you leave the hospital or clinic. This is important.  Ask your health care provider how your surgical site will be marked or identified. Staying hydrated Follow instructions from your health care provider about hydration, which may include:  Up to 2 hours before the procedure - you may continue to drink clear liquids, such as water, clear fruit juice, black coffee, and plain tea. Eating and drinking restrictions Follow instructions from your health care provider about eating and drinking, which may include:  8 hours before the procedure - stop eating heavy meals or foods such as meat, fried foods, or fatty foods.  6 hours before the procedure - stop eating light meals or foods, such as toast or cereal.  6 hours before the procedure - stop drinking milk or drinks that contain milk.  2 hours before the procedure - stop drinking clear liquids. What happens during the procedure?  To lower your risk of infection: ? Your health care  team will wash or sanitize their hands. ? Your skin will be washed with soap.  An IV will be inserted into one of your veins.  You will be given one of the following: ? A medicine to numb the area (local anesthetic). ? A medicine to make you fall asleep (general anesthetic).  An incision will be made over the bump at the big toe joint. Depending on the type of procedure that is needed for your bunion, your  surgeon may do one or more of the following: ? Remove the bunion (exostectomy). ? Cut or replace the damaged joint in your big toe (osteotomy). ? Use screws or other hardware to keep your foot in the correct position (arthrodesis). ? Tighten or loosen tissues around the big toe to reposition the toe.  The incision will be closed with stitches (sutures) and covered with adhesive strips or another type of bandage (dressing).  A supportive device, such as a cast that you can walk on (boot), may be placed on your foot. The procedure may vary among health care providers and hospitals. What happens after the procedure?  Your blood pressure, heart rate, breathing rate, and blood oxygen level will be monitored until the medicines you were given have worn off.  Do not drive for 24 hours if you were given a sedative during your procedure.  Ask your health care provider when it is safe to drive if you have a boot or other supportive device on your foot.  You may be given instructions about how much body weight you can or cannot support on your foot (weight-bearing restrictions). You may be given crutches, a cane, or a walker to help you move around so that you do not put (bear) weight on your foot. Summary  Bunion surgery is done to remove a bunion, which is a bony bump on the big toe joint, on the inner side of the foot.  You may need bunion surgery if your bunion is very large or painful or it affects your ability to walk.  Before the procedure, follow instructions from your health care provider about eating and drinking, and ask about changing or stopping your regular medicines. This information is not intended to replace advice given to you by your health care provider. Make sure you discuss any questions you have with your health care provider. Document Revised: 06/22/2017 Document Reviewed: 04/16/2017 Elsevier Patient Education  2020 [employer].

## 2020-07-07 ENCOUNTER — Other Ambulatory Visit: Payer: Self-pay

## 2020-07-07 ENCOUNTER — Ambulatory Visit
Admission: RE | Admit: 2020-07-07 | Discharge: 2020-07-07 | Disposition: A | Discharge status: Home / Self Care | Payer: 59 | Source: Ambulatory Visit | Attending: Podiatry | Admitting: Podiatry

## 2020-07-07 DIAGNOSIS — M19272 Secondary osteoarthritis, left ankle and foot: Secondary | ICD-10-CM

## 2020-07-07 DIAGNOSIS — M19271 Secondary osteoarthritis, right ankle and foot: Secondary | ICD-10-CM

## 2020-07-07 DIAGNOSIS — M19071 Primary osteoarthritis, right ankle and foot: Secondary | ICD-10-CM | POA: Diagnosis not present

## 2020-07-07 DIAGNOSIS — M2011 Hallux valgus (acquired), right foot: Secondary | ICD-10-CM | POA: Diagnosis not present

## 2020-07-07 DIAGNOSIS — M79672 Pain in left foot: Secondary | ICD-10-CM | POA: Diagnosis not present

## 2020-07-07 IMAGING — MR MR FOOT*L* W/O CM
4 of 6 series · 23 of 40 positions shown · non-contrast
Comparison: Plain films left foot [DATE].

CLINICAL DATA: Left foot pain.  History hallux valgus repair.

EXAM:
MRI OF THE LEFT FOOT WITHOUT CONTRAST
TECHNIQUE: Multiplanar, multisequence MR imaging of the left foot was
performed. No intravenous contrast was administered.

[Series 4: T1 · coronal · 3.0mm · 0.19mm/px · 4 of 44 slices shown]
[im 1/44]
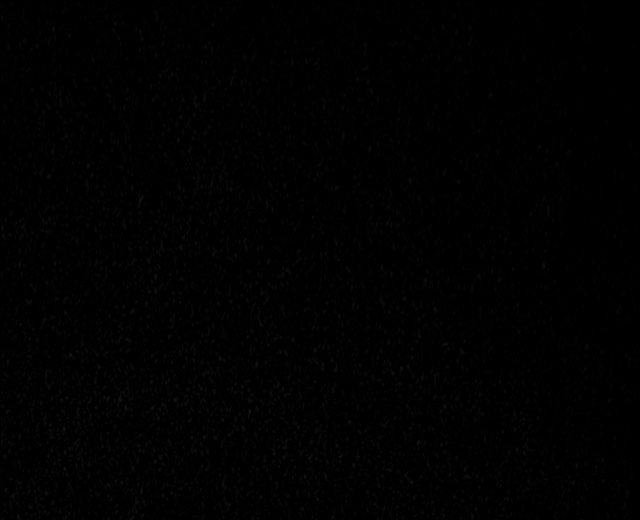
[im 6/44]
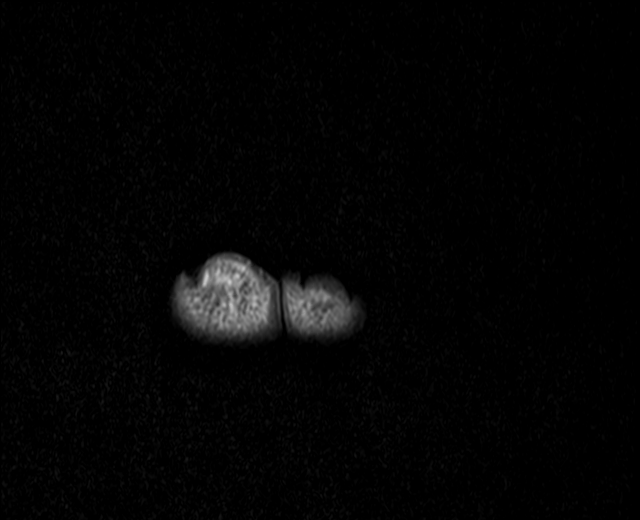
[im 22/44]
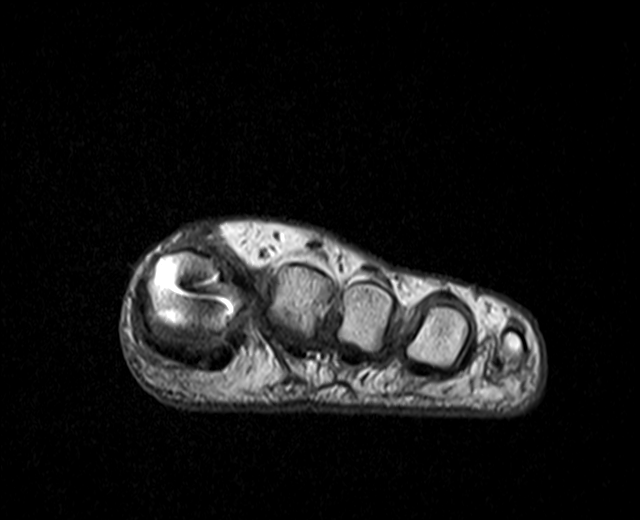
[im 38/44]
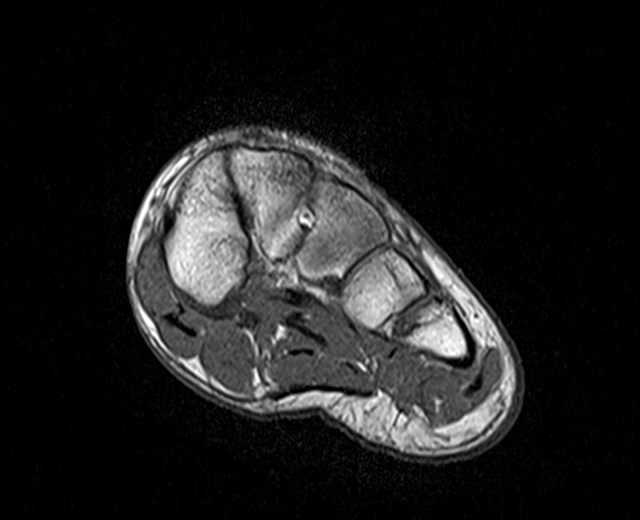

[Series 5: T2 fat-sat · coronal · 3.0mm · 0.19mm/px · 8 of 44 slices shown (1 of 2)]
[im 1/44]
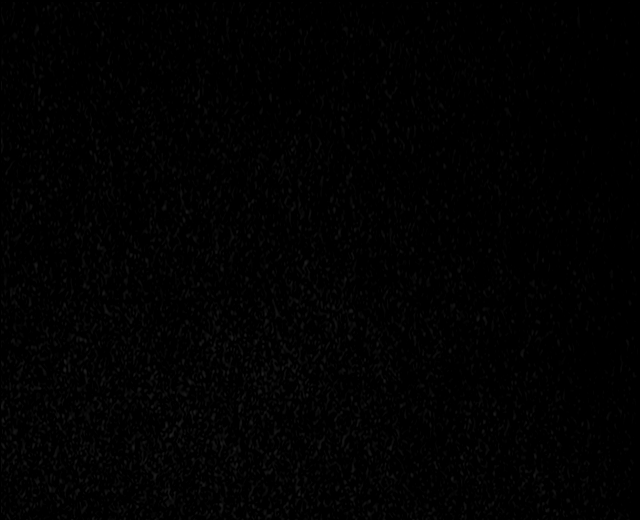
[im 5/44]
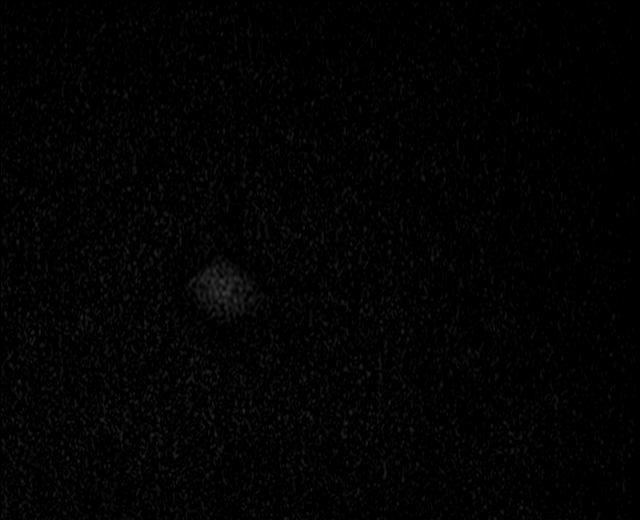
[im 15/44]
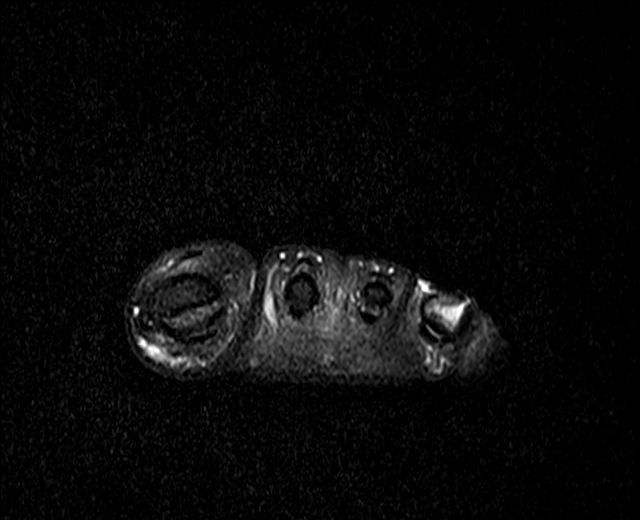
[im 20/44]
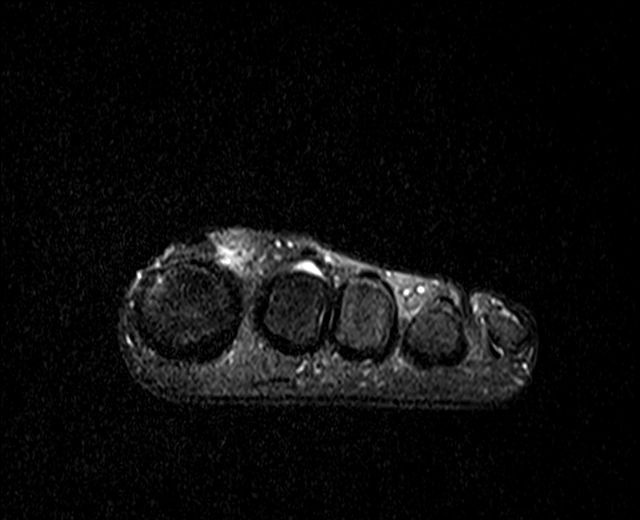
[im 24/44]
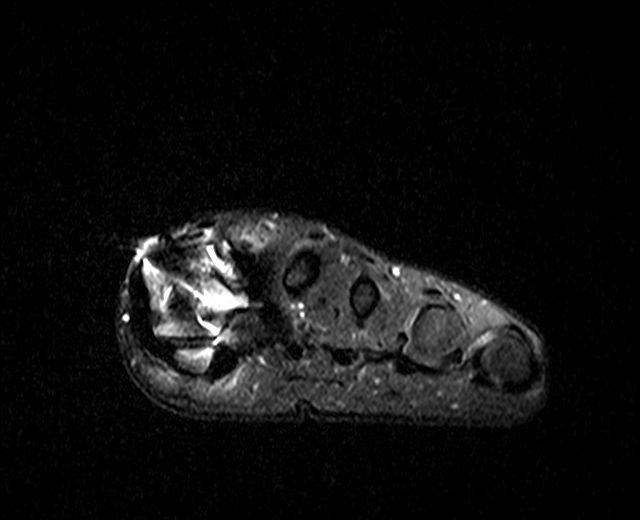
[im 29/44]
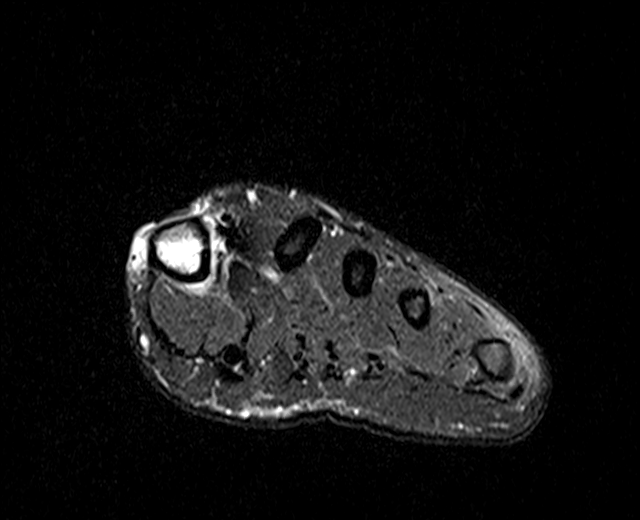
[im 39/44]
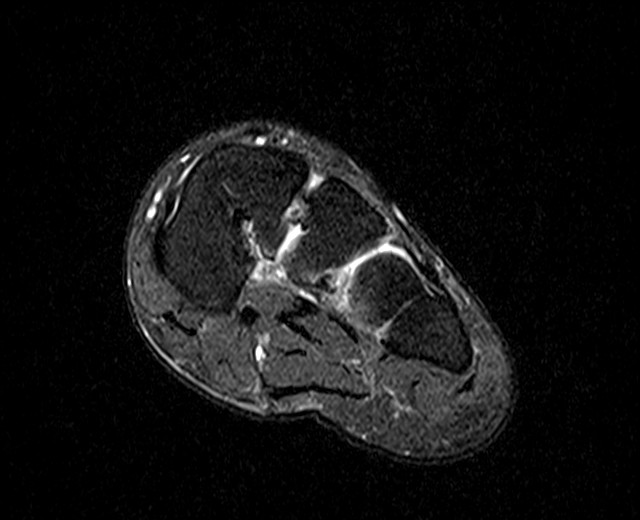
[im 44/44]
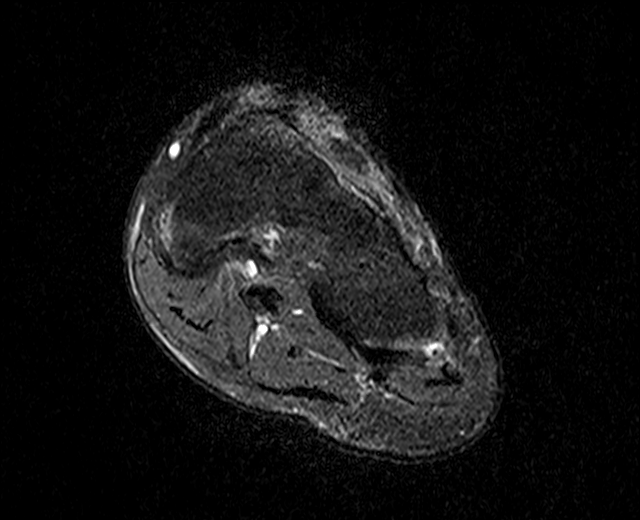

[Series 6: T2 fat-sat · axial · 3.0mm · 0.35mm/px · z∈[-102,-28]mm · 5 of 21 slices shown (2 of 2)]
[im 1/21]
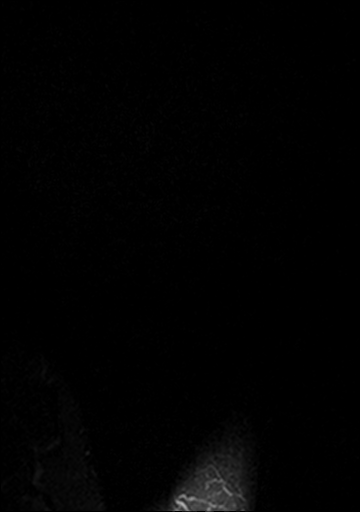
[im 6/21]
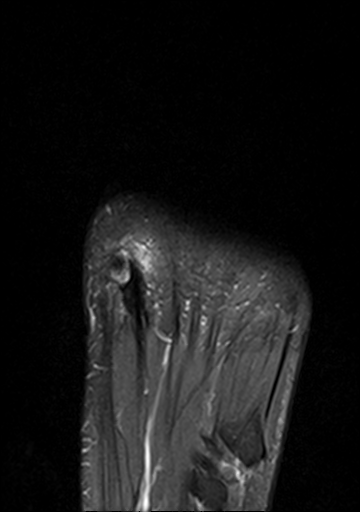
[im 11/21]
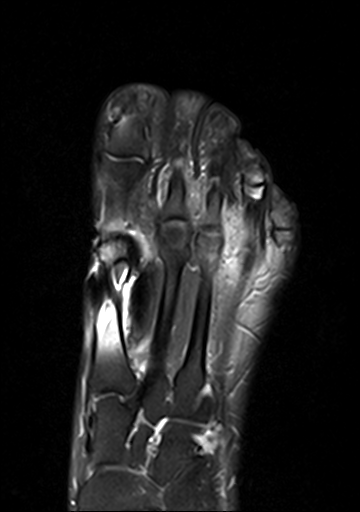
[im 16/21]
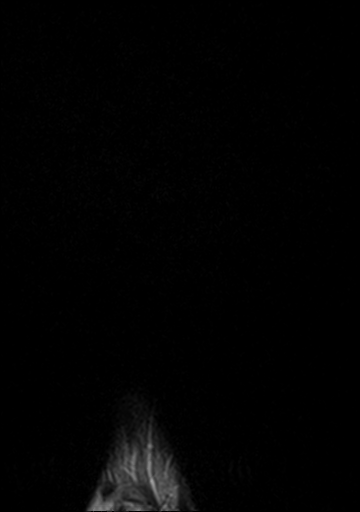
[im 21/21]
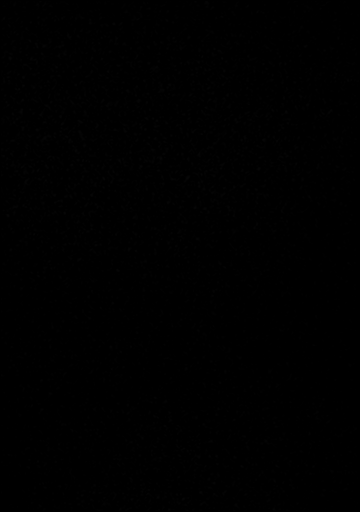

[Series 9: PD fat-sat · sagittal · 3.0mm · 0.70mm/px · 6 of 26 slices shown]
[im 1/26]
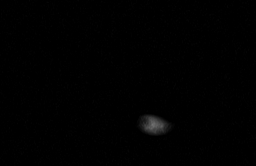
[im 6/26]
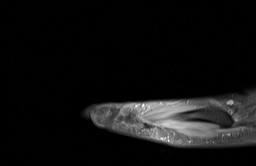
[im 11/26]
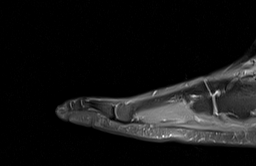
[im 16/26]
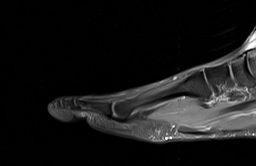
[im 21/26]
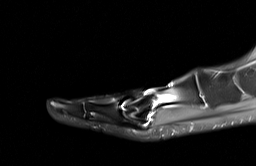
[im 26/26]
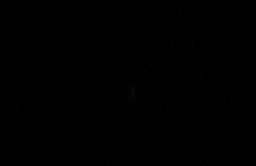

[23 of 40 positions shown; findings below may reference images not displayed]

FINDINGS: Bones/Joint/Cartilage

There is artifact from 2 wires fixing a healed osteotomy defect of
the distal first metatarsal. No fracture, stress change or worrisome
lesion is identified. Joint spaces are preserved. No erosion,
osteophytosis or periostitis.

Ligaments

Intact.

Muscles and Tendons

Intact and normal in appearance.

Soft tissues

Negative.
IMPRESSION: Status post hallux valgus repair. The examination is otherwise
unremarkable. Negative for arthropathy

## 2020-07-07 IMAGING — MR MR FOOT*R* W/O CM
4 of 6 series · 24 of 40 positions shown · non-contrast
Comparison: Plain films right foot [DATE].

CLINICAL DATA: Chronic right foot pain.

EXAM:
MRI OF THE RIGHT FOREFOOT WITHOUT CONTRAST
TECHNIQUE: Multiplanar, multisequence MR imaging of the right forefoot was
performed. No intravenous contrast was administered.

[Series 4: T1 · coronal · 3.0mm · 0.19mm/px · 4 of 44 slices shown]
[im 1/44]
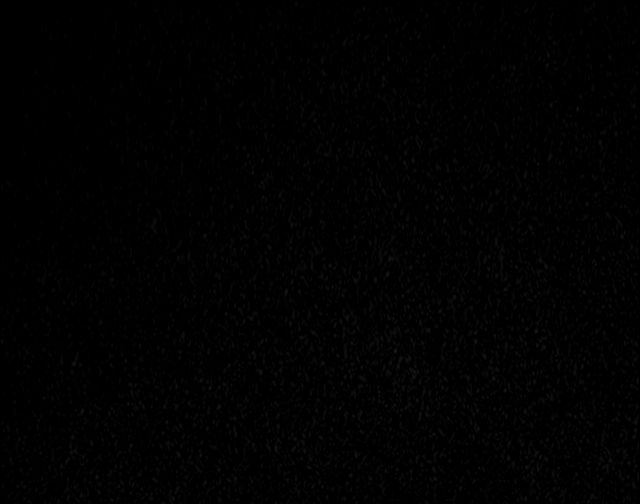
[im 6/44]
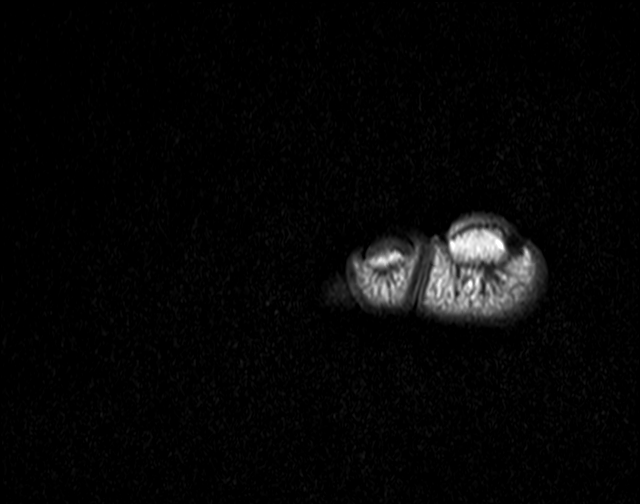
[im 22/44]
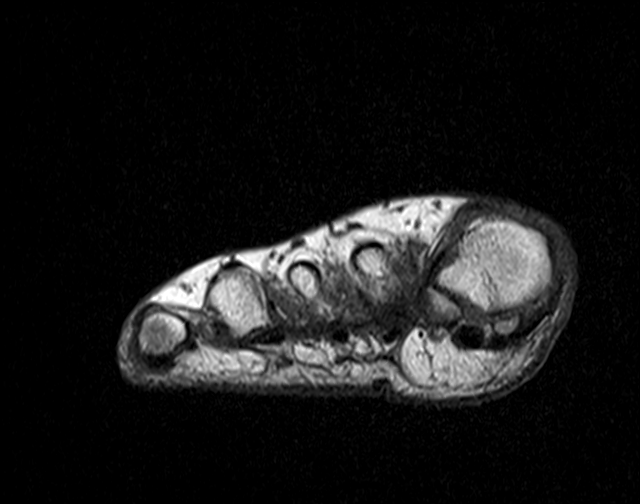
[im 38/44]
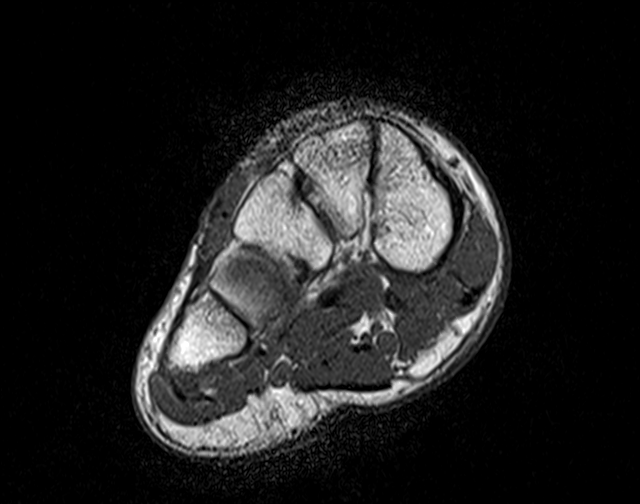

[Series 5: T2 fat-sat · coronal · 3.0mm · 0.19mm/px · 9 of 44 slices shown (1 of 2)]
[im 1/44]
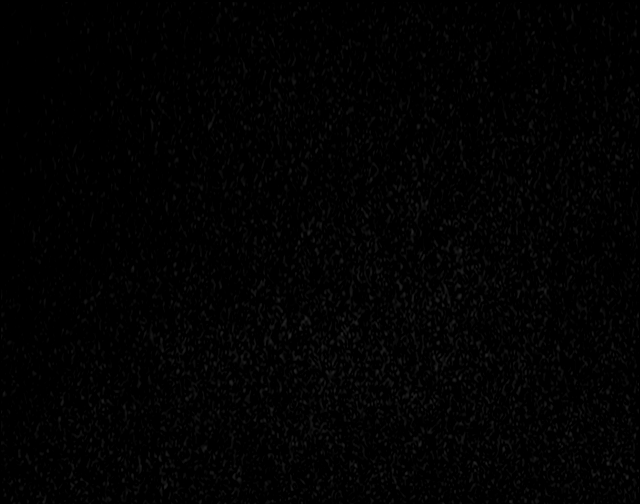
[im 6/44]
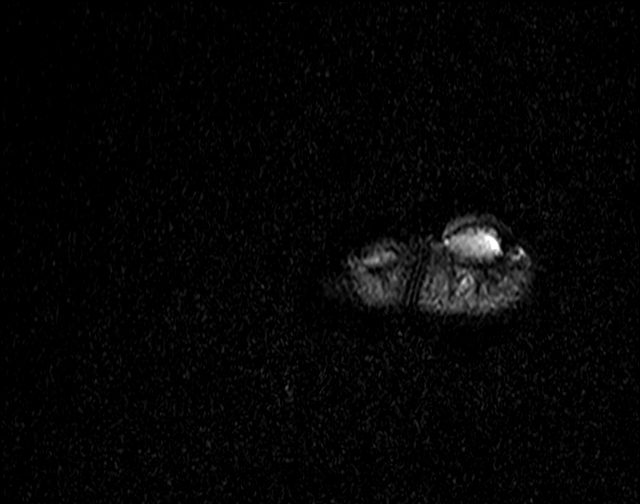
[im 11/44]
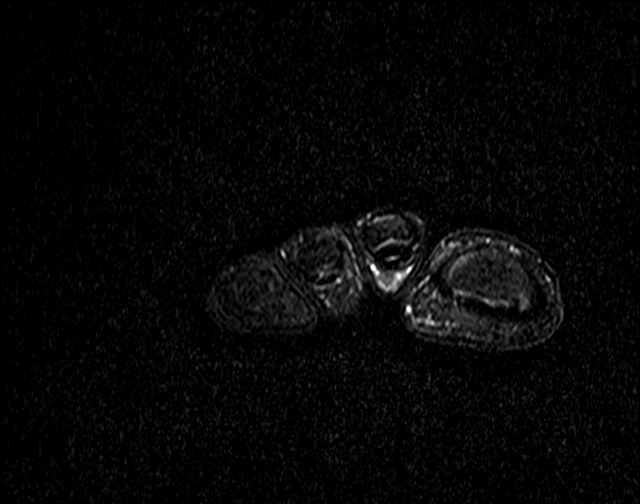
[im 17/44]
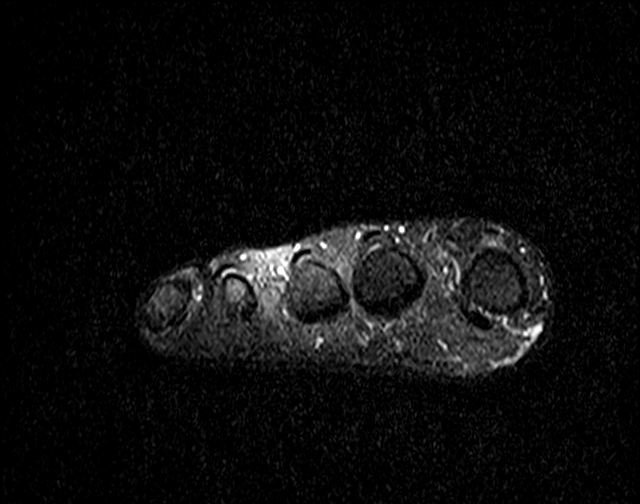
[im 22/44]
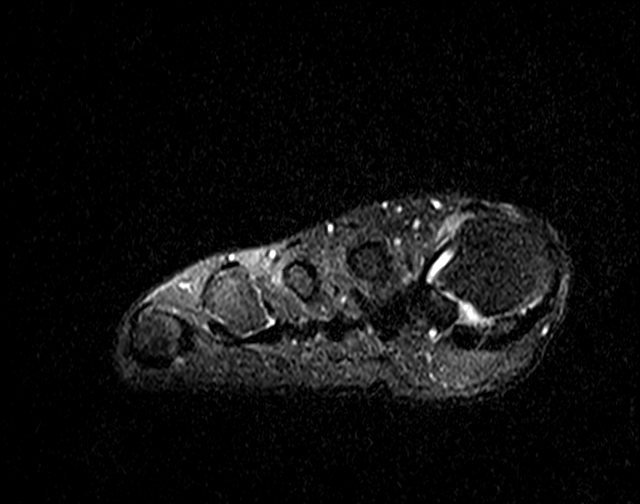
[im 27/44]
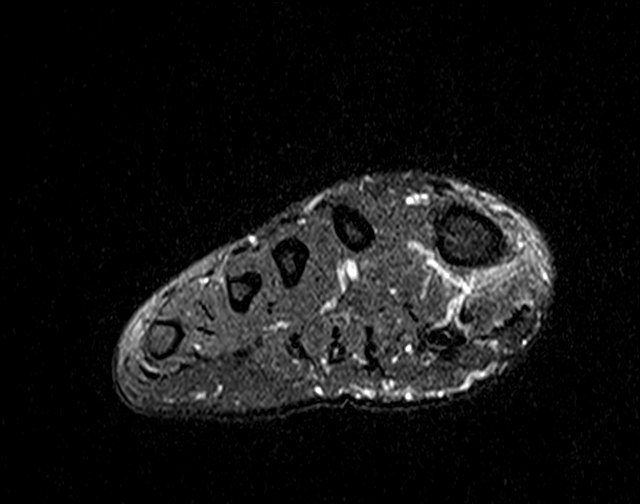
[im 33/44]
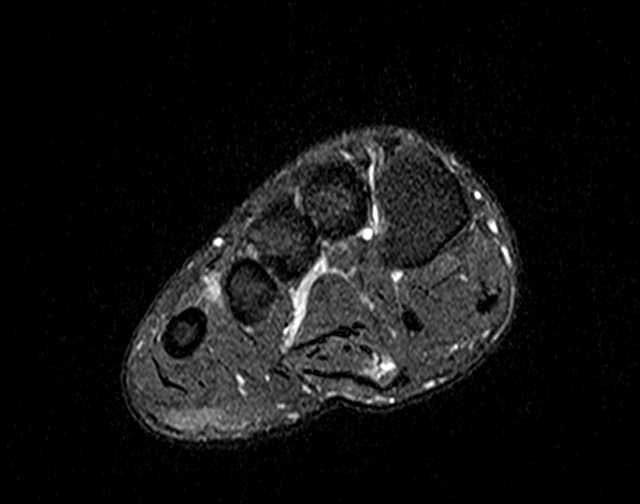
[im 38/44]
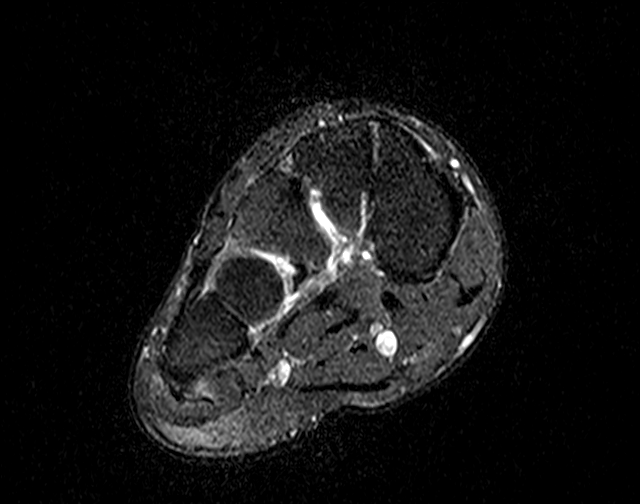
[im 44/44]
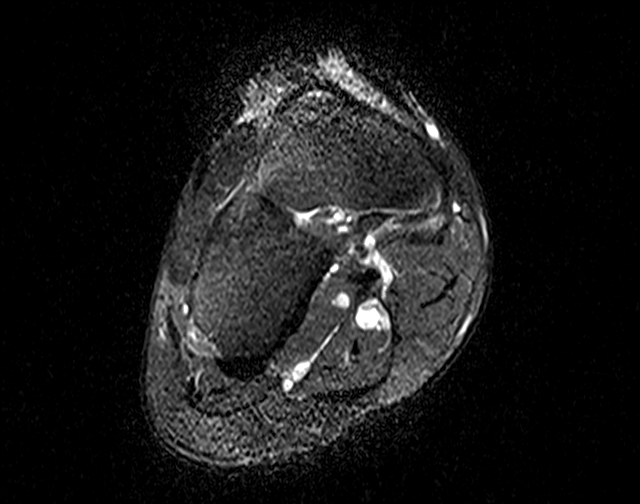

[Series 6: T2 fat-sat · axial · 3.0mm · 0.35mm/px · z∈[-141,-64]mm · 5 of 22 slices shown (2 of 2)]
[im 1/22]
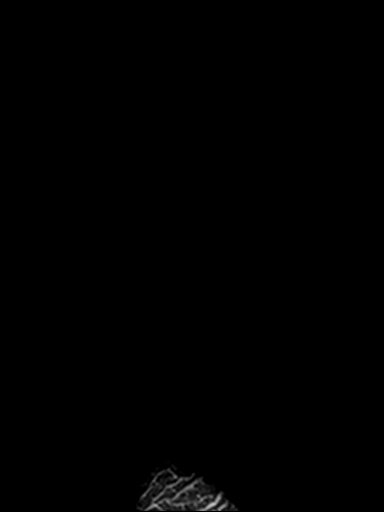
[im 6/22]
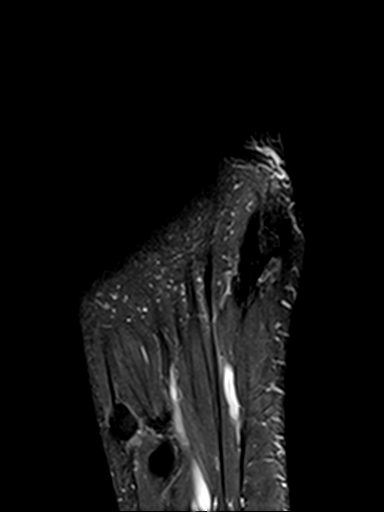
[im 11/22]
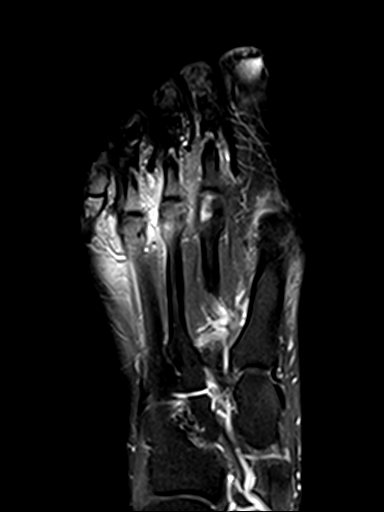
[im 16/22]
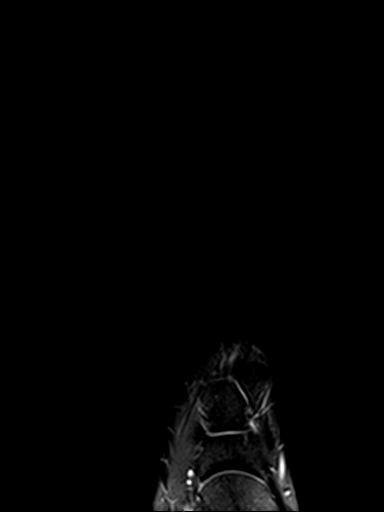
[im 22/22]
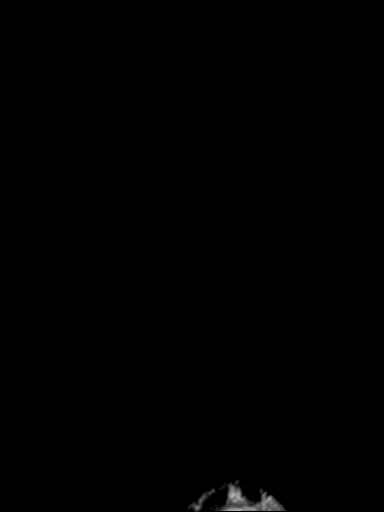

[Series 8: PD fat-sat · sagittal · 3.0mm · 0.70mm/px · 6 of 28 slices shown]
[im 1/28]
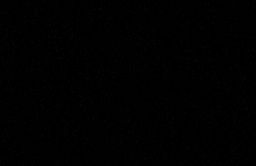
[im 6/28]
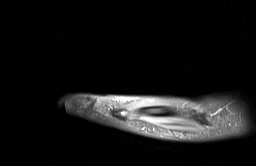
[im 11/28]
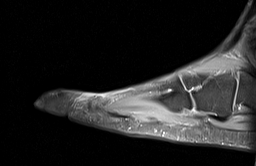
[im 17/28]
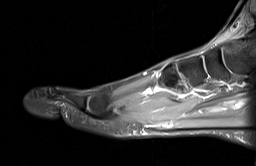
[im 22/28]
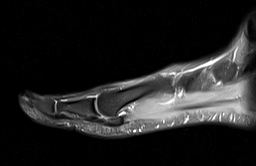
[im 28/28]
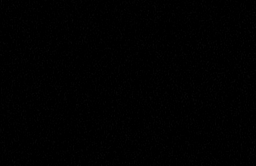

[24 of 40 positions shown; findings below may reference images not displayed]

FINDINGS: Bones/Joint/Cartilage

Mild hallux valgus is again seen. Marrow signal is normal without
fracture, stress change or worrisome lesion. Joint spaces are
preserved. Minimal osteophytosis is seen off the plantar surface of
the first metatarsal head at its articulation with the lateral
sesamoid. No erosion or periostitis. No joint effusion.

Ligaments

Intact.

Muscles and Tendons

Intact.  No atrophy or focal lesion.

Soft tissues

Normal.
IMPRESSION: Mild hallux valgus and very mild first MTP osteoarthritis. The exam
is otherwise negative.

## 2020-07-08 ENCOUNTER — Ambulatory Visit (INDEPENDENT_AMBULATORY_CARE_PROVIDER_SITE_OTHER): Payer: 59

## 2020-07-08 DIAGNOSIS — J309 Allergic rhinitis, unspecified: Secondary | ICD-10-CM | POA: Diagnosis not present

## 2020-07-10 ENCOUNTER — Encounter: Payer: Self-pay | Admitting: Podiatry

## 2020-07-11 ENCOUNTER — Other Ambulatory Visit: Payer: 59

## 2020-07-13 ENCOUNTER — Ambulatory Visit (INDEPENDENT_AMBULATORY_CARE_PROVIDER_SITE_OTHER): Payer: 59

## 2020-07-13 ENCOUNTER — Other Ambulatory Visit: Payer: Self-pay

## 2020-07-13 DIAGNOSIS — J309 Allergic rhinitis, unspecified: Secondary | ICD-10-CM | POA: Diagnosis not present

## 2020-07-20 ENCOUNTER — Ambulatory Visit (INDEPENDENT_AMBULATORY_CARE_PROVIDER_SITE_OTHER): Payer: 59

## 2020-07-20 DIAGNOSIS — J309 Allergic rhinitis, unspecified: Secondary | ICD-10-CM

## 2020-07-21 ENCOUNTER — Telehealth: Payer: Self-pay | Admitting: Allergy

## 2020-07-21 DIAGNOSIS — Z79899 Other long term (current) drug therapy: Secondary | ICD-10-CM | POA: Diagnosis not present

## 2020-07-21 DIAGNOSIS — R202 Paresthesia of skin: Secondary | ICD-10-CM | POA: Diagnosis not present

## 2020-07-21 DIAGNOSIS — M792 Neuralgia and neuritis, unspecified: Secondary | ICD-10-CM | POA: Diagnosis not present

## 2020-07-21 NOTE — Telephone Encounter (Signed)
Please call patient, have her schedule a televisit for tomorrow to discuss the reaction further.  Thank you.

## 2020-07-21 NOTE — Telephone Encounter (Signed)
Patient states her PCP believes she is having an allergic reaction to Carbinoxamine Maleate. Patient is having inflammation and PCP told her to stop. PCP is sending her to rheumotology. Patient would like an alternative to this medication.  Please advise.

## 2020-07-21 NOTE — Telephone Encounter (Signed)
Called and left a voicemail for the patient asking for her to return call to schedule this appointment.

## 2020-07-22 ENCOUNTER — Other Ambulatory Visit: Payer: Self-pay

## 2020-07-22 ENCOUNTER — Ambulatory Visit (INDEPENDENT_AMBULATORY_CARE_PROVIDER_SITE_OTHER): Payer: 59 | Admitting: Podiatry

## 2020-07-22 ENCOUNTER — Encounter: Payer: Self-pay | Admitting: Podiatry

## 2020-07-22 DIAGNOSIS — M2011 Hallux valgus (acquired), right foot: Secondary | ICD-10-CM | POA: Diagnosis not present

## 2020-07-22 DIAGNOSIS — M21611 Bunion of right foot: Secondary | ICD-10-CM

## 2020-07-22 DIAGNOSIS — M898X7 Other specified disorders of bone, ankle and foot: Secondary | ICD-10-CM | POA: Diagnosis not present

## 2020-07-22 NOTE — Progress Notes (Signed)
Subjective:  Patient ID: Anne Jefferson, female    DOB: 06/27/1976,  MRN: [id number]  Chief Complaint  Patient presents with  . MRI results    PT stated that she is here for MRI results.    44 y.o. female returns with the above complaint. History confirmed with patient.  She completed the MRI.  She is here to discuss and plan for surgery.  The neuritic symptoms she was having I discussed with her her primary care doctor thinks it is a secondary reaction to one of her medications and hopefully this will resolve this.  Her partner is available by video chat, she will be taken care of her in the postop phase Objective:  Physical Exam: warm, good capillary refill, no trophic changes or ulcerative lesions, normal DP and PT pulses and normal sensory exam. Left Foot: Well-healed surgical scar, mild tenderness over the first MTPJ.  Over the dorsal TMT there is bony prominence which is slightly tender to palpation. Right Foot: Moderate hallux valgus with large dorsomedial eminence, bony prominence over the first TMT which slightly tender to palpation.    Radiographs: X-ray of both feet: On the right foot there is hallux valgus with large increase in the intermetatarsal angle and deviation of the sesamoid complex, there is bony prominence over the first TMT with maintained joint space.  On the left foot there is previous hallux valgus correction with Kirschner wire fixation, mild narrowing of the first MTPJ.  Bony prominence of the first TMT with maintained joint space.  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 Hallux valgus with bunions, right   2. Exostosis of left foot   3. Exostosis of right foot      Plan:  Patient was evaluated and treated and all questions answered.   Reviewed the MRI in detail with her and discussed that there is no  appreciable arthritis and hopefully she can avoid tarsometatarsal arthrodesis at this point.  I think we should proceed with bunionectomy of the right foot and exostectomy of the palpable dorsal spurs on both feet.  I discussed with her that normally bilateral foot surgery can be difficult but with a simple exostectomy that this would be possible.  She will be weightbearing as tolerated in surgical shoes bilaterally.  We discussed all risks including but not limited to: pain, swelling, infection, scar, numbness which may be temporary or permanent, chronic pain, stiffness, nerve pain or damage, wound healing problems, bone healing problems including delayed or non-union and recurrence. Specifically we discussed the following procedures: Distal metatarsal chevron osteotomy with possible Akin osteotomy through a small incision under fluoroscopy as well as exostectomy of the dorsal spurring on both feet.  She would like to plan for surgery the first week of March.  We discussed the recuperation period and the period of weightbearing in a surgical shoe and how long she will need to be out of work.  All questions were addressed.  She met with our surgery scheduler to discuss surgery.  Informed consent was reviewed and signed in the office today.  Surgical shoes were dispensed.   Surgical plan:  Procedure: -Right foot minimally invasive bunionectomy with chevron and possible Akin osteotomies with screw fixation  Location: -[facility]  Anesthesia plan: -IV sedation with local anesthesia  Postoperative pain plan: -Tylenol 1000 mg every 6 hours, ibuprofen 600 mg every 6 hours, gabapentin  300 mg every 8 hours x5 days, oxycodone 5 mg 1-2 tabs every 6 hours only as needed  DVT prophylaxis: -None required should be WBAT bilaterally and surgical shoes  WB Restrictions / DME needs: -Surgical shoe WBAT bilateral, dispensed today in the office     No follow-ups on file.

## 2020-07-22 NOTE — Telephone Encounter (Signed)
Patient is schedule to speak with Anne Jefferson on 08/06/2020 for a tele-health.

## 2020-07-27 ENCOUNTER — Other Ambulatory Visit: Payer: Self-pay

## 2020-07-27 ENCOUNTER — Ambulatory Visit (INDEPENDENT_AMBULATORY_CARE_PROVIDER_SITE_OTHER): Payer: 59

## 2020-07-27 DIAGNOSIS — J309 Allergic rhinitis, unspecified: Secondary | ICD-10-CM

## 2020-07-29 ENCOUNTER — Encounter: Payer: Self-pay | Admitting: Allergy

## 2020-07-29 ENCOUNTER — Other Ambulatory Visit: Payer: Self-pay

## 2020-07-29 ENCOUNTER — Ambulatory Visit (INDEPENDENT_AMBULATORY_CARE_PROVIDER_SITE_OTHER): Payer: 59 | Admitting: Allergy

## 2020-07-29 ENCOUNTER — Ambulatory Visit: Payer: 59 | Admitting: Podiatry

## 2020-07-29 ENCOUNTER — Telehealth: Payer: Self-pay

## 2020-07-29 DIAGNOSIS — J4520 Mild intermittent asthma, uncomplicated: Secondary | ICD-10-CM | POA: Diagnosis not present

## 2020-07-29 DIAGNOSIS — J3089 Other allergic rhinitis: Secondary | ICD-10-CM

## 2020-07-29 DIAGNOSIS — L2089 Other atopic dermatitis: Secondary | ICD-10-CM

## 2020-07-29 DIAGNOSIS — R52 Pain, unspecified: Secondary | ICD-10-CM | POA: Diagnosis not present

## 2020-07-29 DIAGNOSIS — G8929 Other chronic pain: Secondary | ICD-10-CM | POA: Insufficient documentation

## 2020-07-29 HISTORY — DX: Other chronic pain: G89.29

## 2020-07-29 MED ORDER — EPINEPHRINE 0.3 MG/0.3ML IJ SOAJ: 0.3000 mg | INTRAMUSCULAR | 1 refills | Status: AC | PRN

## 2020-07-29 MED ORDER — FLOVENT HFA 110 MCG/ACT IN AERO
2.0000 | INHALATION_SPRAY | Freq: Two times a day (BID) | RESPIRATORY_TRACT | 3 refills | Status: DC
Start: 2020-07-29 — End: 2020-10-26

## 2020-07-29 MED ORDER — MOMETASONE FUROATE 50 MCG/ACT NA SUSP: 1.0000 | Freq: Every day | NASAL | 3 refills | Status: DC

## 2020-07-29 MED ORDER — ALBUTEROL SULFATE HFA 108 (90 BASE) MCG/ACT IN AERS: 2.0000 | INHALATION_SPRAY | RESPIRATORY_TRACT | 2 refills | Status: DC | PRN

## 2020-07-29 MED ORDER — LEVOCETIRIZINE DIHYDROCHLORIDE 5 MG PO TABS: 5.0000 mg | ORAL_TABLET | Freq: Every evening | ORAL | 5 refills | Status: DC

## 2020-07-29 NOTE — Assessment & Plan Note (Signed)
Well-controlled. No inhaler use.  . Daily controller medication(s): none.  . During upper respiratory infections/asthma flares: Start Flovent 110mcg 2 puffs twice a day for 1-2 weeks until symptoms back to baseline.  . May use albuterol rescue inhaler 2 puffs every 4 to 6 hours as needed for shortness of breath, chest tightness, coughing, and wheezing. May use albuterol rescue inhaler 2 puffs 5 to 15 minutes prior to strenuous physical activities. Monitor frequency of use. . Get spirometry at next visit.   

## 2020-07-29 NOTE — Assessment & Plan Note (Signed)
   Continue proper skin care. 

## 2020-07-29 NOTE — Assessment & Plan Note (Signed)
Past history - started AIT on 04/22/2019 (M + G/DM/C/D).  STOP allergy injections for now.   Continue environmental control measures.  May use Flonase (fluticasone) OR Nasonex nasal spray 1 spray per nostril twice a day as needed for nasal congestion.   Nasal saline spray (i.e., Simply Saline) or nasal saline lavage (i.e., NeilMed) is recommended as needed and prior to medicated nasal sprays.  Continue with loratadine 10mg  daily - patient can't come off antihistamines as it flares her asthma.

## 2020-07-29 NOTE — Patient Instructions (Addendum)
Pain   Not sure what's causing this.  Will place referral to neurology.   Perennial and seasonal allergic rhinitis  STOP allergy injections for now.   Continue environmental control measures.  May use Flonase (fluticasone) OR Nasonex nasal spray 1 spray per nostril twice a day as needed for nasal congestion.   Nasal saline spray (i.e., Simply Saline) or nasal saline lavage (i.e., NeilMed) is recommended as needed and prior to medicated nasal sprays.  Continue with loratadine 10mg  daily.   Mild intermittent asthma . Daily controller medication(s): none.  . During upper respiratory infections/asthma flares: Start Flovent 115mcg 2 puffs twice a day for 1-2 weeks until symptoms back to baseline.  . May use albuterol rescue inhaler 2 puffs every 4 to 6 hours as needed for shortness of breath, chest tightness, coughing, and wheezing. May use albuterol rescue inhaler 2 puffs 5 to 15 minutes prior to strenuous physical activities. Monitor frequency of use.  . Asthma control goals:  o Full participation in all desired activities (may need albuterol before activity) o Albuterol use two times or less a week on average (not counting use with activity) o Cough interfering with sleep two times or less a month o Oral steroids no more than once a year o No hospitalizations  Atopic dermatitis  Continue proper skin care.  Follow up after neurology appointment.   Reducing Pollen Exposure . Pollen seasons: trees (spring), grass (summer) and ragweed/weeds (fall). Marland Kitchen Keep windows closed in your home and car to lower pollen exposure.  Anne Jefferson air conditioning in the bedroom and throughout the house if possible.  . Avoid going out in dry windy days - especially early morning. . Pollen counts are highest between 5 - 10 AM and on dry, hot and windy days.  . Save outside activities for late afternoon or after a heavy rain, when pollen levels are lower.  . Avoid mowing of grass if you have grass  pollen allergy. Marland Kitchen Be aware that pollen can also be transported indoors on people and pets.  . Dry your clothes in an automatic dryer rather than hanging them outside where they might collect pollen.  . Rinse hair and eyes before bedtime. Pet Allergen Avoidance: . Contrary to popular opinion, there are no "hypoallergenic" breeds of dogs or cats. That is because people are not allergic to an animal's hair, but to an allergen found in the animal's saliva, dander (dead skin flakes) or urine. Pet allergy symptoms typically occur within minutes. For some people, symptoms can build up and become most severe 8 to 12 hours after contact with the animal. People with severe allergies can experience reactions in public places if dander has been transported on the pet owners' clothing. Marland Kitchen Keeping an animal outdoors is only a partial solution, since homes with pets in the yard still have higher concentrations of animal allergens. . Before getting a pet, ask your allergist to determine if you are allergic to animals. If your pet is already considered part of your family, try to minimize contact and keep the pet out of the bedroom and other rooms where you spend a great deal of time. . As with dust mites, vacuum carpets often or replace carpet with a hardwood floor, tile or linoleum. . High-efficiency particulate air (HEPA) cleaners can reduce allergen levels over time. . While dander and saliva are the source of cat and dog allergens, urine is the source of allergens from rabbits, hamsters, mice and [place] pigs; so ask a non-allergic  family member to clean the animal's cage. . If you have a pet allergy, talk to your allergist about the potential for allergy immunotherapy (allergy shots). This strategy can often provide long-term relief. Control of House Dust Mite Allergen . Dust mite allergens are a common trigger of allergy and asthma symptoms. While they can be found throughout the house, these microscopic creatures  thrive in warm, humid environments such as bedding, upholstered furniture and carpeting. . Because so much time is spent in the bedroom, it is essential to reduce mite levels there.  . Encase pillows, mattresses, and box springs in special allergen-proof fabric covers or airtight, zippered plastic covers.  . Bedding should be washed weekly in hot water (130 F) and dried in a hot dryer. Allergen-proof covers are available for comforters and pillows that can't be regularly washed.  Anne Jefferson the allergy-proof covers every few months. Minimize clutter in the bedroom. Keep pets out of the bedroom.  Marland Kitchen Keep humidity less than 50% by using a dehumidifier or air conditioning. You can buy a humidity measuring device called a hygrometer to monitor this.  . If possible, replace carpets with hardwood, linoleum, or washable area rugs. If that's not possible, vacuum frequently with a vacuum that has a HEPA filter. . Remove all upholstered furniture and non-washable window drapes from the bedroom. . Remove all non-washable stuffed toys from the bedroom.  Wash stuffed toys weekly. Mold Control . Mold and fungi can grow on a variety of surfaces provided certain temperature and moisture conditions exist.  . Outdoor molds grow on plants, decaying vegetation and soil. The major outdoor mold, Alternaria and Cladosporium, are found in very high numbers during hot and dry conditions. Generally, a late summer - fall peak is seen for common outdoor fungal spores. Rain will temporarily lower outdoor mold spore count, but counts rise rapidly when the rainy period ends. . The most important indoor molds are Aspergillus and Penicillium. Dark, humid and poorly ventilated basements are ideal sites for mold growth. The next most common sites of mold growth are the bathroom and the kitchen. Outdoor (Seasonal) Mold Control . Use air conditioning and keep windows closed. . Avoid exposure to decaying vegetation. Marland Kitchen Avoid leaf  raking. . Avoid grain handling. . Consider wearing a face mask if working in moldy areas.  Indoor (Perennial) Mold Control  . Maintain humidity below 50%. . Get rid of mold growth on hard surfaces with water, detergent and, if necessary, 5% bleach (do not mix with other cleaners). Then dry the area completely. If mold covers an area more than 10 square feet, consider hiring an indoor environmental professional. . For clothing, washing with soap and water is best. If moldy items cannot be cleaned and dried, throw them away. . Remove sources e.g. contaminated carpets. . Repair and seal leaking roofs or pipes. Using dehumidifiers in damp basements may be helpful, but empty the water and clean units regularly to prevent mildew from forming. All rooms, especially basements, bathrooms and kitchens, require ventilation and cleaning to deter mold and mildew growth. Avoid carpeting on concrete or damp floors, and storing items in damp areas.  Skin care recommendations  Bath time: . Always use lukewarm water. AVOID very hot or cold water. Marland Kitchen Keep bathing time to 5-10 minutes. . Do NOT use bubble bath. . Use a mild soap and use just enough to wash the dirty areas. . Do NOT scrub skin vigorously.  . After bathing, pat dry your skin with a towel.  Do NOT rub or scrub the skin.  Moisturizers and prescriptions:  . ALWAYS apply moisturizers immediately after bathing (within 3 minutes). This helps to lock-in moisture. . Use the moisturizer several times a day over the whole body. Kermit Balo summer moisturizers include: Aveeno, CeraVe, Cetaphil. Kermit Balo winter moisturizers include: Aquaphor, Vaseline, Cerave, Cetaphil, Eucerin, Vanicream. . When using moisturizers along with medications, the moisturizer should be applied about one hour after applying the medication to prevent diluting effect of the medication or moisturize around where you applied the medications. When not using medications, the moisturizer can be  continued twice daily as maintenance.  Laundry and clothing: . Avoid laundry products with added color or perfumes. . Use unscented hypo-allergenic laundry products such as Tide free, Cheer free & gentle, and All free and clear.  . If the skin still seems dry or sensitive, you can try double-rinsing the clothes. . Avoid tight or scratchy clothing such as wool. . Do not use fabric softeners or dyer sheets.

## 2020-07-29 NOTE — Assessment & Plan Note (Addendum)
Worsening, daily pain for the past 4-6 weeks. No prior history of neuropathic pain like this before. Denies any other neurological symptoms. PCP evaluation showed low vitamin D. Stopped carbinoxamine with possibly initial benefit but symptoms flared after her allergy injection. Patient is not sure if this is just coincidental as she has been on antihistamines and allergy shots for a long time. Currently on gabapentin. No recent infections.   Discussed with patient that I'm not sure what's causing her symptoms as antihistamines and allergy immunotherapy typically do not cause this.   Symptoms described concerning for neuropathy.   Patient requesting neurology referral which was placed.   Meanwhile advised patient to hold allergy injections.

## 2020-07-29 NOTE — Progress Notes (Signed)
RE: Anne Jefferson MRN: [id number] DOB: 02-May-1976 Date of Telemedicine Visit: 07/29/2020  Referring provider: Josetta Huddle, MD Primary care provider: Josetta Huddle, MD  Chief Complaint: Pain (Severe pain going down hands, feet, legs, and back. Started a few weeks ago mildly in feet. )   Telemedicine Follow Up Visit via Telephone: I connected with Anne Jefferson for a follow up on 07/29/20 by telephone and verified that I am speaking with the correct person using two identifiers.   I discussed the limitations, risks, security and privacy concerns of performing an evaluation and management service by telephone and the availability of in person appointments. I also discussed with the patient that there may be a patient responsible charge related to this service. The patient expressed understanding and agreed to proceed.  Patient is at home/work. Provider is at the office.  Visit start time: 11:13AM Visit end time: 11:35AM Insurance consent/check in by: front desk Medical consent and medical assistant/nurse: Lachelle S.  History of Present Illness: She is a 45 y.o. female, who is being followed for allergic rhinitis on AIT, atopic dermatitis, asthma. Her previous allergy office visit was on 02/26/2020 with Dr. Maudie Mercury. Today is a new complaint visit of pain.  About 4-6 weeks ago, patient noted shooting pains in the bottom of her feet. Then a few weeks ago, noted some severe pain in the palms of her hands.  Denies any changes in diet, medications or recent infections.   Since then it spread to her arms, legs and back. Describes the pain as shooting pain and it stays there for about 15 minutes at a time.  Symptoms more pronounced on the right side than left. There is some associated numbness and weakness.   Denies any dizziness, lightheadedness or other associated symptoms.  No prior history of pains likes this.   Patient went to see PCP. Had the below labwork.  Crp, Sed  rate, TSH, CMP, CBC diff - normal.  Vitamin D - low She was referred to rheumatology as well.   Patient stopped carbinoxamine last week - and patient noted some improvement perhaps but then her symptoms flared a few hours after getting her allergy injections. She has been on allergy injections since Sept 2020.   Now taking Claritin and nasonex. Can't come off antihistamines as it flares her asthma.   She was started on gabapentin at night as it caused drowsiness.   Mild intermittent asthma Denies any SOB, coughing, wheezing, chest tightness, nocturnal awakenings, ER/urgent care visits or prednisone use since the last visit.  Assessment and Plan: Anne Jefferson is a 45 y.o. female with: Shooting pain Worsening, daily pain for the past 4-6 weeks. No prior history of neuropathic pain like this before. Denies any other neurological symptoms. PCP evaluation showed low vitamin D. Stopped carbinoxamine with possibly initial benefit but symptoms flared after her allergy injection. Patient is not sure if this is just coincidental as she has been on antihistamines and allergy shots for a long time. Currently on gabapentin. No recent infections.   Discussed with patient that I'm not sure what's causing her symptoms as antihistamines and allergy immunotherapy typically do not cause this.   Symptoms described concerning for neuropathy.   Patient requesting neurology referral which was placed.   Meanwhile advised patient to hold allergy injections.  Perennial and seasonal allergic rhinitis Past history - started AIT on 04/22/2019 (M + G/DM/C/D).  STOP allergy injections for now.   Continue environmental control measures.  May use Flonase (fluticasone) OR Nasonex  nasal spray 1 spray per nostril twice a day as needed for nasal congestion.   Nasal saline spray (i.e., Simply Saline) or nasal saline lavage (i.e., NeilMed) is recommended as needed and prior to medicated nasal sprays.  Continue with  loratadine 10mg  daily - patient can't come off antihistamines as it flares her asthma.   Mild intermittent asthma without complication Well-controlled. No inhaler use.  . Daily controller medication(s): none.  . During upper respiratory infections/asthma flares: Start Flovent 2 puffs twice a day for 1-2 weeks until symptoms back to baseline.  . May use albuterol rescue inhaler 2 puffs every 4 to 6 hours as needed for shortness of breath, chest tightness, coughing, and wheezing. May use albuterol rescue inhaler 2 puffs 5 to 15 minutes prior to strenuous physical activities. Monitor frequency of use. . Get spirometry at next visit.    Atopic dermatitis  Continue proper skin care.  Return in about 2 months (around 09/26/2020).  Meds ordered this encounter  Medications  . albuterol (PROVENTIL HFA) 108 (90 Base) MCG/ACT inhaler    Sig: Inhale 2 puffs into the lungs every 4 (four) hours as needed for wheezing or shortness of breath.    Dispense:  1 each    Refill:  2  . EPINEPHrine (AUVI-Q) 0.3 mg/0.3 mL IJ SOAJ injection    Sig: Inject 0.3 mg into the muscle as needed for anaphylaxis.    Dispense:  2 each    Refill:  1    [phone] (M)  . fluticasone (FLOVENT HFA) 110 MCG/ACT inhaler    Sig: Inhale 2 puffs into the lungs 2 (two) times daily.    Dispense:  12 g    Refill:  3  . DISCONTD: levocetirizine (XYZAL) 5 MG tablet    Sig: Take 1 tablet (5 mg total) by mouth every evening.    Dispense:  30 tablet    Refill:  5  . mometasone (NASONEX) 50 MCG/ACT nasal spray    Sig: Place 1 spray into the nose daily.    Dispense:  1 each    Refill:  3    Diagnostics: None.  Medication List:  Current Outpatient Medications  Medication Sig Dispense Refill  . gabapentin (NEURONTIN) 100 MG capsule Take 100 mg by mouth 3 (three) times daily.    [phone] albuterol (PROVENTIL HFA) 108 (90 Base) MCG/ACT inhaler Inhale 2 puffs into the lungs every 4 (four) hours as needed for wheezing or  shortness of breath. 1 each 2  . EPINEPHrine (AUVI-Q) 0.3 mg/0.3 mL IJ SOAJ injection Inject 0.3 mg into the muscle as needed for anaphylaxis. 2 each 1  . fluticasone (FLOVENT HFA) 110 MCG/ACT inhaler Inhale 2 puffs into the lungs 2 (two) times daily. 12 g 3  . mometasone (NASONEX) 50 MCG/ACT nasal spray Place 1 spray into the nose daily. 1 each 3   No current facility-administered medications for this visit.   Allergies: Allergies  Allergen Reactions  . Gluten Meal Other (See Comments)    "Internal damage to his intestines" "Internal damage to his intestines"   . Promethazine Other (See Comments)    Hallucinations  . Theophyllines Nausea And Vomiting  . Mite (D. Farinae) Other (See Comments)  . Other Other (See Comments)   I reviewed her past medical history, social history, family history, and environmental history and no significant changes have been reported from her previous visit.  Review of Systems  Constitutional: Negative for appetite change, chills, fever and unexpected weight change.  HENT: Negative for congestion and rhinorrhea.   Eyes: Negative for itching.  Respiratory: Negative for cough, chest tightness, shortness of breath and wheezing.   Gastrointestinal: Negative for abdominal pain.  Skin: Negative for rash.  Allergic/Immunologic: Positive for environmental allergies.  Neurological: Negative for headaches.   Objective: Physical Exam Not obtained as encounter was done via telephone.   Previous notes and tests were reviewed.  I discussed the assessment and treatment plan with the patient. The patient was provided an opportunity to ask questions and all were answered. The patient agreed with the plan and demonstrated an understanding of the instructions. After visit summary/patient instructions available via mychart.   The patient was advised to call back or seek an in-person evaluation if the symptoms worsen or if the condition fails to improve as  anticipated.  I provided 22 minutes of non-face-to-face time during this encounter.  It was my pleasure to participate in [place] care today. Please feel free to contact me with any questions or concerns.   Sincerely,  Rexene Alberts, DO Allergy & Immunology  [facility] office: [place] office: [phone]

## 2020-07-29 NOTE — Telephone Encounter (Signed)
Referral needed to Neurologist  DX: Pain in Extremities Per Dr. Selena Batten

## 2020-08-03 NOTE — Telephone Encounter (Signed)
Patient is scheduled with [facility] on 10/06/20 at 7:30 a.m.

## 2020-08-06 ENCOUNTER — Ambulatory Visit: Payer: Self-pay | Admitting: Family Medicine

## 2020-08-12 DIAGNOSIS — M792 Neuralgia and neuritis, unspecified: Secondary | ICD-10-CM | POA: Diagnosis not present

## 2020-08-12 DIAGNOSIS — I776 Arteritis, unspecified: Secondary | ICD-10-CM | POA: Diagnosis not present

## 2020-08-12 DIAGNOSIS — M79606 Pain in leg, unspecified: Secondary | ICD-10-CM | POA: Diagnosis not present

## 2020-08-12 DIAGNOSIS — M79641 Pain in right hand: Secondary | ICD-10-CM | POA: Diagnosis not present

## 2020-08-17 DIAGNOSIS — H0288B Meibomian gland dysfunction left eye, upper and lower eyelids: Secondary | ICD-10-CM | POA: Diagnosis not present

## 2020-08-17 DIAGNOSIS — H0288A Meibomian gland dysfunction right eye, upper and lower eyelids: Secondary | ICD-10-CM | POA: Diagnosis not present

## 2020-08-17 DIAGNOSIS — H0102B Squamous blepharitis left eye, upper and lower eyelids: Secondary | ICD-10-CM | POA: Diagnosis not present

## 2020-08-17 DIAGNOSIS — H04123 Dry eye syndrome of bilateral lacrimal glands: Secondary | ICD-10-CM | POA: Diagnosis not present

## 2020-08-17 DIAGNOSIS — H0102A Squamous blepharitis right eye, upper and lower eyelids: Secondary | ICD-10-CM | POA: Diagnosis not present

## 2020-08-17 DIAGNOSIS — G514 Facial myokymia: Secondary | ICD-10-CM | POA: Diagnosis not present

## 2020-08-17 DIAGNOSIS — H16141 Punctate keratitis, right eye: Secondary | ICD-10-CM | POA: Diagnosis not present

## 2020-08-31 ENCOUNTER — Encounter: Payer: Self-pay | Admitting: Diagnostic Neuroimaging

## 2020-08-31 ENCOUNTER — Ambulatory Visit (INDEPENDENT_AMBULATORY_CARE_PROVIDER_SITE_OTHER): Payer: 59 | Admitting: Diagnostic Neuroimaging

## 2020-08-31 VITALS — BP 120/78 | HR 76 | Ht 63.0 in | Wt 180.2 lb

## 2020-08-31 DIAGNOSIS — R519 Headache, unspecified: Secondary | ICD-10-CM | POA: Diagnosis not present

## 2020-08-31 DIAGNOSIS — R52 Pain, unspecified: Secondary | ICD-10-CM | POA: Diagnosis not present

## 2020-08-31 DIAGNOSIS — R200 Anesthesia of skin: Secondary | ICD-10-CM

## 2020-08-31 NOTE — Progress Notes (Signed)
[facility]  PATIENT: Anne Jefferson DOB: 21-Nov-1975  REFERRING CLINICIAN: Garnet Sierras, DO HISTORY FROM: patient  REASON FOR VISIT: new consult    HISTORICAL  CHIEF COMPLAINT:  Chief Complaint  Patient presents with  . Pain in extremities    Rm 7 New Pt "6-8 weeks ago I started having severe pain in my feet, now in hands/fingers, hands are weak; some headaches; few falls from loss of balance, dizziness"    HISTORY OF PRESENT ILLNESS:   45 year old female with new onset numbness and pain.  Symptoms started around December 2021.  She describes numbness and pain in her toes and feet, starting on the right side and spreading to the left.  Then she had pain in her fingertips and arms.  She is also had swollen sensation throughout her body.  Also having headaches, gait and balance difficulty.  She went to PCP and then rheumatology.  No specific causes have been found for her symptoms.  She has tried gabapentin with mild relief.   REVIEW OF SYSTEMS: Full 14 system review of systems performed and negative with exception of: As per HPI.  ALLERGIES: Allergies  Allergen Reactions  . Promethazine Other (See Comments)    Hallucinations  . Theophyllines Nausea And Vomiting  . Mite (D. Farinae) Other (See Comments)  . Other Other (See Comments)    HOME MEDICATIONS: Outpatient Medications Prior to Visit  Medication Sig Dispense Refill  . albuterol (PROVENTIL HFA) 108 (90 Base) MCG/ACT inhaler Inhale 2 puffs into the lungs every 4 (four) hours as needed for wheezing or shortness of breath. 1 each 2  . Cholecalciferol (VITAMIN D3) 125 MCG (5000 UT) TBDP See admin instructions.    Marland Kitchen EPINEPHrine (AUVI-Q) 0.3 mg/0.3 mL IJ SOAJ injection Inject 0.3 mg into the muscle as needed for anaphylaxis. 2 each 1  . fexofenadine (ALLEGRA) 180 MG tablet 1 tablet Swallow whole with water; do not take with fruit juices.    . fluticasone (FLOVENT HFA) 110 MCG/ACT inhaler Inhale 2  puffs into the lungs 2 (two) times daily. 12 g 3  . gabapentin (NEURONTIN) 300 MG capsule Take 300 mg by mouth 3 (three) times daily.    . mometasone (NASONEX) 50 MCG/ACT nasal spray Place 1 spray into the nose daily. 1 each 3  . gabapentin (NEURONTIN) 100 MG capsule Take 100 mg by mouth 3 (three) times daily.     No facility-administered medications prior to visit.    PAST MEDICAL HISTORY: Past Medical History:  Diagnosis Date  . Abnormal Pap smear   . Anxiety    no meds  . Asthma    mild, no issue for years no  inhaler use except in winter  . Biliary colic   . Blood type, Rh negative   . Gallstones   . H/O dysmenorrhea   . Infertility, female   . Nausea and vomiting   . Pain    back and chest realted to gallbladder since november 2018  . PONV (postoperative nausea and vomiting)    severe, needs heavy anti nausea meds    PAST SURGICAL HISTORY: Past Surgical History:  Procedure Laterality Date  .  c sections     x 2  3 births last was twins  . CHOLECYSTECTOMY N/A 11/22/2017   Procedure: LAPAROSCOPIC CHOLECYSTECTOMY ERAS PATHWAY;  Surgeon: Kieth Brightly Arta Bruce, MD;  Location: [facility];  Service: General;  Laterality: N/A;  . DILATION AND CURETTAGE OF UTERUS  2011  . LEEP  12/23/2009  .  WISDOM TOOTH EXTRACTION      FAMILY HISTORY: Family History  Problem Relation Age of Onset  . Other Mother        Varicosities  . Diabetes Mother        gestational  . Allergic rhinitis Mother   . Diabetes Maternal Grandmother   . Skin cancer Maternal Grandmother     SOCIAL HISTORY: Social History   Socioeconomic History  . Marital status: Legally Separated    Spouse name: Not on file  . Number of children: 1  . Years of education: Not on file  . 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 Smoking status: Never Smoker  . Smokeless tobacco: Never Used  Vaping Use   . Vaping Use: Never used  Substance and Sexual Activity  . Alcohol use: No  . Drug use: No  . Sexual activity: Yes    Birth control/protection: None  Other Topics Concern  . Not on file  Social History Narrative  . Not on file   Social Determinants of Health   Financial Resource Strain: Not on file  Food Insecurity: Not on file  Transportation Needs: Not on file  Physical Activity: Not on file  Stress: Not on file  Social Connections: Not on file  Intimate Partner Violence: Not on file     PHYSICAL EXAM  GENERAL EXAM/CONSTITUTIONAL: Vitals:  Vitals:   08/31/20 1535  BP: 120/78  Pulse: 76  Weight: 180 lb 3.2 oz (81.7 kg)  Height: _0  (1.6 m)   Body mass index is 31.92 kg/m. Wt Readings from Last 3 Encounters:  08/31/20 180 lb 3.2 oz (81.7 kg)  04/01/19 177 lb 8 oz (80.5 kg)  09/17/18 170 lb 4 oz (77.2 kg)    Patient is in no distress; well developed, nourished and groomed; neck is supple  CARDIOVASCULAR:  Examination of carotid arteries is normal; no carotid bruits  Regular rate and rhythm, no murmurs  Examination of peripheral vascular system by observation and palpation is normal  EYES:  Ophthalmoscopic exam of optic discs and posterior segments is normal; no papilledema or hemorrhages No exam data present  MUSCULOSKELETAL:  Gait, strength, tone, movements noted in Neurologic exam below  NEUROLOGIC: MENTAL STATUS:  No flowsheet data found.  awake, alert, oriented to person, place and time  recent and remote memory intact  normal attention and concentration  language fluent, comprehension intact, naming intact  fund of knowledge appropriate  CRANIAL NERVE:   2nd - no papilledema on fundoscopic exam  2nd, 3rd, 4th, 6th - pupils equal and reactive to light, visual fields full to confrontation, extraocular muscles intact, no nystagmus  5th - facial sensation symmetric  7th - facial strength symmetric  8th - hearing intact  9th -  palate elevates symmetrically, uvula midline  11th - shoulder shrug symmetric  12th - tongue protrusion midline  MOTOR:   normal bulk and tone, full strength in the BUE, BLE  SENSORY:   normal and symmetric to light touch, temperature, vibration  COORDINATION:   finger-nose-finger, fine finger movements normal  REFLEXES:   deep tendon reflexes TRACE and symmetric  GAIT/STATION:   narrow based gait     DIAGNOSTIC DATA (LABS, IMAGING, TESTING) - I reviewed patient records, labs, notes, testing and imaging myself where available.  Lab Results  Component Value Date   WBC 5.0 11/24/2017   HGB  11.8 (L) 11/24/2017   HCT 36.0 11/24/2017   MCV 87.8 11/24/2017   PLT 143 (L) 11/24/2017      Component Value Date/Time   NA 138 11/24/2017 0852   K 3.6 11/24/2017 0852   CL 107 11/24/2017 0852   CO2 22 11/24/2017 0852   GLUCOSE 111 (H) 11/24/2017 0852   BUN 11 11/24/2017 0852   CREATININE 0.68 11/24/2017 0852   CALCIUM 9.0 11/24/2017 0852   PROT 6.5 11/24/2017 0852   ALBUMIN 3.7 11/24/2017 0852   AST 51 (H) 11/24/2017 0852   ALT 109 (H) 11/24/2017 0852   ALKPHOS 41 11/24/2017 0852   BILITOT 1.0 11/24/2017 0852   GFRNONAA >60 11/24/2017 0852   GFRAA >60 11/24/2017 0852   No results found for: CHOL, HDL, LDLCALC, LDLDIRECT, TRIG, CHOLHDL No results found for: HGBA1C No results found for: VITAMINB12 No results found for: TSH     ASSESSMENT AND PLAN  45 y.o. year old female here with new onset of numbness, pain, headaches, gait and balance ability in December 2021.  We will proceed with further work-up.  DDx: neuropathy versus CNS autoimmune/inflammatory/demyelinating etiologies  1. Pain   2. Numbness   3. Nonintractable headache, unspecified chronicity pattern, unspecified headache type     PLAN:  PAIN (hands, feet, headaches) - check MRI brain, labs - continue gabapentin  Orders Placed This Encounter  Procedures  . MR BRAIN W WO CONTRAST  . CBC  with diff  . CMP  . Vitamin B12  . MMA  . Homocysteine  . A1c  . TSH  . SPEP with IFE  . ANA w/Reflex  . SSA, SSB  . ESR  . CRP  . ANCA  . Vitamin B6  . Vitamin B1  . RPR  . HIV  . Hepatitis C antibody  . Hepatitis B core antibody, total  . Hepatitis B surface antigen  . Hepatitis B surface antibody, qualitative  . Anti-HU, Anti-RI, Anti-YO IFA  . Celiac Ab TTG DGP TIGA  . NCV with EMG(electromyography)   Return for for NCV/EMG.    Penni Bombard, MD 07/24/9145, 8:29 PM Certified in Neurology, Neurophysiology and Neuroimaging  [facility] [address] [place], [place] [address] [phone]

## 2020-08-31 NOTE — Patient Instructions (Signed)
-   check MRI, EMG, labs - continue gabapentin

## 2020-09-01 ENCOUNTER — Telehealth: Payer: Self-pay | Admitting: Diagnostic Neuroimaging

## 2020-09-01 ENCOUNTER — Emergency Department (HOSPITAL_COMMUNITY): Payer: 59

## 2020-09-01 ENCOUNTER — Other Ambulatory Visit: Payer: Self-pay

## 2020-09-01 ENCOUNTER — Emergency Department (HOSPITAL_COMMUNITY)
Admission: EM | Admit: 2020-09-01 | Discharge: 2020-09-01 | Disposition: A | Discharge status: Home / Self Care | Payer: 59 | Attending: Emergency Medicine | Admitting: Emergency Medicine

## 2020-09-01 ENCOUNTER — Telehealth: Payer: Self-pay | Admitting: *Deleted

## 2020-09-01 ENCOUNTER — Encounter (HOSPITAL_COMMUNITY): Payer: Self-pay

## 2020-09-01 DIAGNOSIS — Z7951 Long term (current) use of inhaled steroids: Secondary | ICD-10-CM | POA: Diagnosis not present

## 2020-09-01 DIAGNOSIS — R42 Dizziness and giddiness: Secondary | ICD-10-CM | POA: Diagnosis not present

## 2020-09-01 DIAGNOSIS — R519 Headache, unspecified: Secondary | ICD-10-CM | POA: Insufficient documentation

## 2020-09-01 DIAGNOSIS — J4520 Mild intermittent asthma, uncomplicated: Secondary | ICD-10-CM | POA: Diagnosis not present

## 2020-09-01 DIAGNOSIS — M4802 Spinal stenosis, cervical region: Secondary | ICD-10-CM | POA: Insufficient documentation

## 2020-09-01 DIAGNOSIS — F419 Anxiety disorder, unspecified: Secondary | ICD-10-CM | POA: Diagnosis not present

## 2020-09-01 DIAGNOSIS — M5412 Radiculopathy, cervical region: Secondary | ICD-10-CM

## 2020-09-01 DIAGNOSIS — H538 Other visual disturbances: Secondary | ICD-10-CM | POA: Diagnosis not present

## 2020-09-01 DIAGNOSIS — R202 Paresthesia of skin: Secondary | ICD-10-CM | POA: Diagnosis not present

## 2020-09-01 DIAGNOSIS — R69 Illness, unspecified: Secondary | ICD-10-CM | POA: Diagnosis not present

## 2020-09-01 LAB — COMPREHENSIVE METABOLIC PANEL
ALT: 32 U/L (ref 0–44)
AST: 23 U/L (ref 15–41)
Albumin: 3.8 g/dL (ref 3.5–5.0)
Alkaline Phosphatase: 42 U/L (ref 38–126)
Anion gap: 11 (ref 5–15)
BUN: 15 mg/dL (ref 6–20)
CO2: 26 mmol/L (ref 22–32)
Calcium: 9.1 mg/dL (ref 8.9–10.3)
Chloride: 101 mmol/L (ref 98–111)
Creatinine, Ser: 0.73 mg/dL (ref 0.44–1.00)
GFR, Estimated: >60 mL/min (ref >60)
Glucose, Bld: 102 mg/dL — ABNORMAL HIGH (ref 70–99)
Potassium: 3.9 mmol/L (ref 3.5–5.1)
Sodium: 138 mmol/L (ref 135–145)
Total Bilirubin: 0.6 mg/dL (ref 0.3–1.2)
Total Protein: 7 g/dL (ref 6.5–8.1)

## 2020-09-01 LAB — PROTIME-INR
INR: 1 (ref 0.8–1.2)
Prothrombin Time: 12.7 seconds (ref 11.4–15.2)

## 2020-09-01 LAB — DIFFERENTIAL
Abs Immature Granulocytes: 0.01 10*3/uL (ref 0.00–0.07)
Basophils Absolute: 0.1 10*3/uL (ref 0.0–0.1)
Basophils Relative: 1 %
Eosinophils Absolute: 0.2 10*3/uL (ref 0.0–0.5)
Eosinophils Relative: 3 %
Immature Granulocytes: 0 %
Lymphocytes Relative: 28 %
Lymphs Abs: 1.7 10*3/uL (ref 0.7–4.0)
Monocytes Absolute: 0.6 10*3/uL (ref 0.1–1.0)
Monocytes Relative: 10 %
Neutro Abs: 3.5 10*3/uL (ref 1.7–7.7)
Neutrophils Relative %: 58 %

## 2020-09-01 LAB — APTT: aPTT: 30 seconds (ref 24–36)

## 2020-09-01 LAB — CBC
HCT: 38.9 % (ref 36.0–46.0)
Hemoglobin: 13.1 g/dL (ref 12.0–15.0)
MCH: 30.2 pg (ref 26.0–34.0)
MCHC: 33.7 g/dL (ref 30.0–36.0)
MCV: 89.6 fL (ref 80.0–100.0)
Platelets: 250 10*3/uL (ref 150–400)
RBC: 4.34 MIL/uL (ref 3.87–5.11)
RDW: 12.1 % (ref 11.5–15.5)
WBC: 6.1 10*3/uL (ref 4.0–10.5)
nRBC: 0 % (ref 0.0–0.2)

## 2020-09-01 LAB — I-STAT BETA HCG BLOOD, ED (MC, WL, AP ONLY): I-stat hCG, quantitative: <5 m[IU]/mL (ref <5)

## 2020-09-01 IMAGING — CT CT HEAD W/O CM
3 series · 16 of 47 positions shown, 19 images · non-contrast
Comparison: None.

CLINICAL DATA: Dizziness, right arm neuropathy

EXAM:
CT HEAD WITHOUT CONTRAST
TECHNIQUE: Contiguous axial images were obtained from the base of the skull
through the vertex without intravenous contrast.

[Series 4: head 3.0 mpr cor · coronal · 0.33mm/px · 3 of 67 slices shown]
[im 23/67  brain]
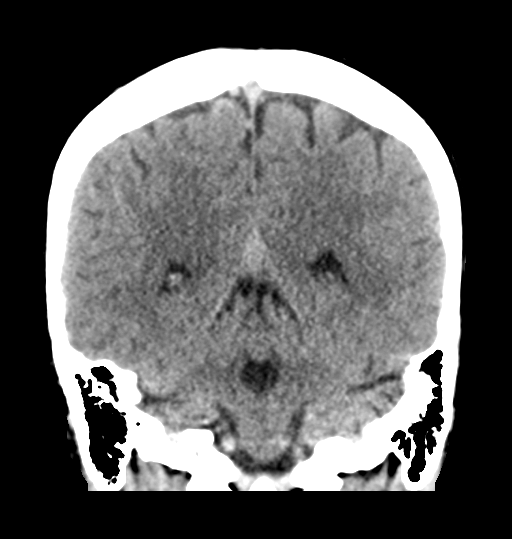
[im 30/67  brain]
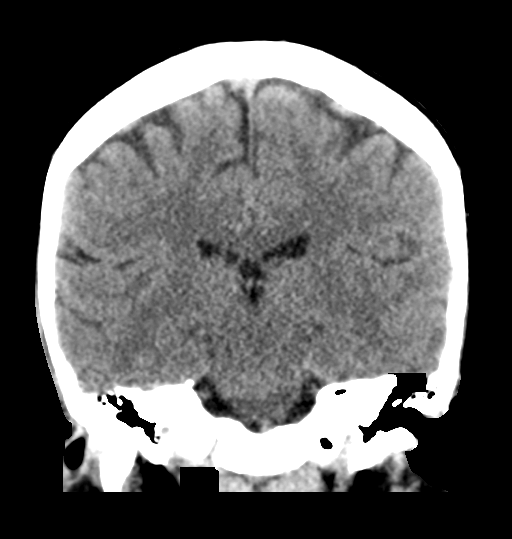
[im 37/67  brain]
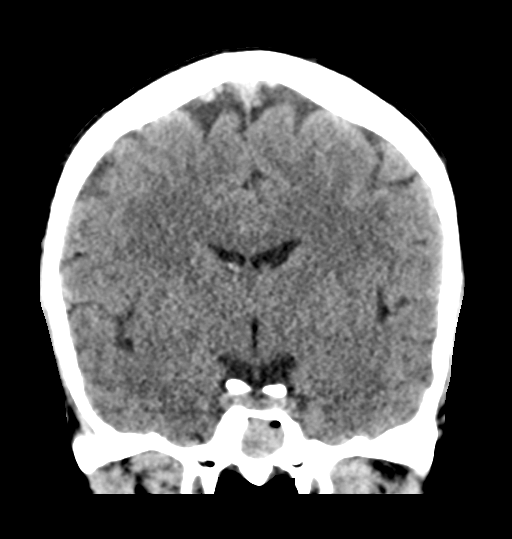

[Series 5: head 3.0 mpr sag · sagittal · 0.34mm/px · 3 of 51 slices shown]
[im 17/51  brain]
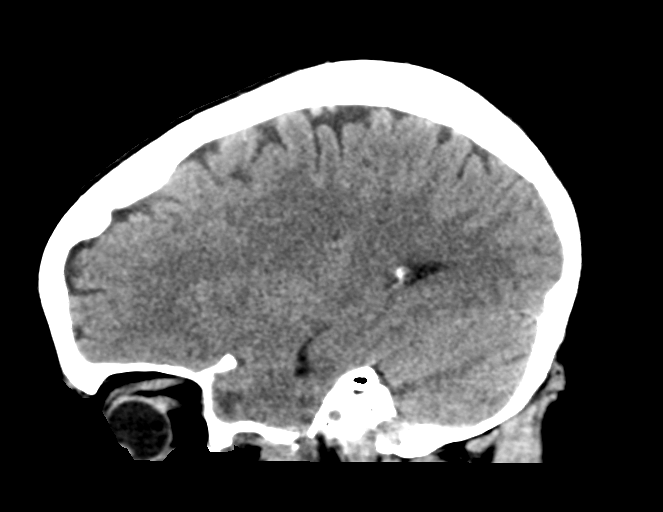
[im 26/51  brain]
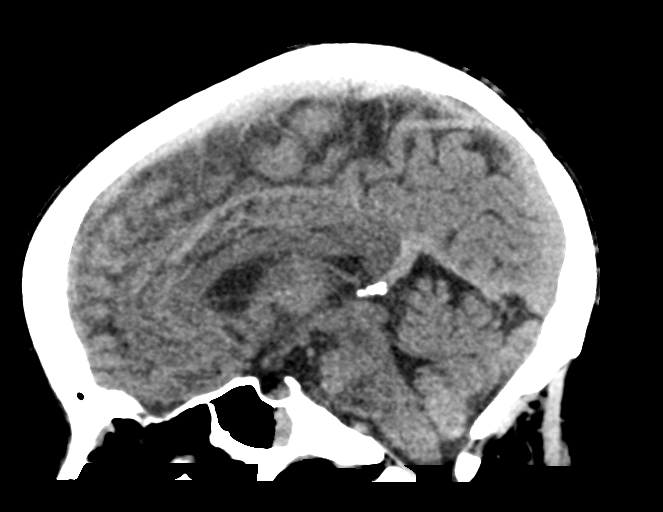
[im 34/51  brain]
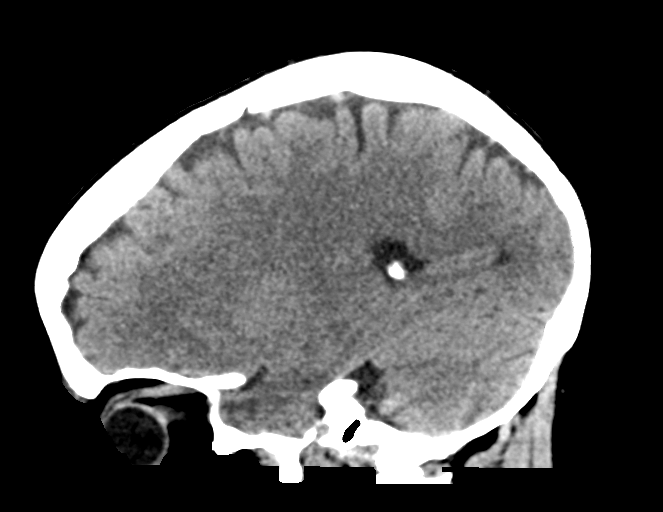

[Series 6: head 5.0 h30s · axial · 0.44mm/px · z∈[-154,-24]mm · 10 of 32 slices shown, 13 images]
[im 3/32  brain]
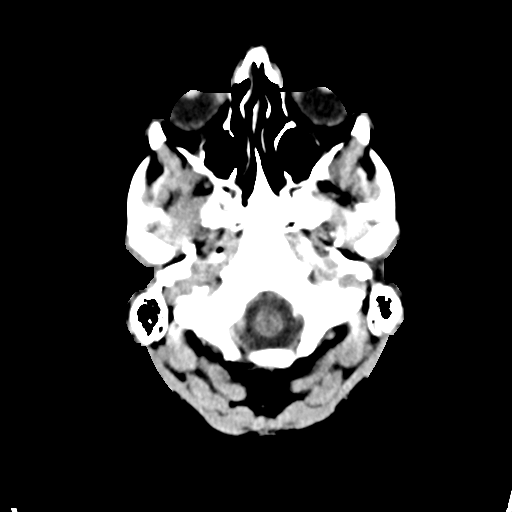
[im 3/32  bone]
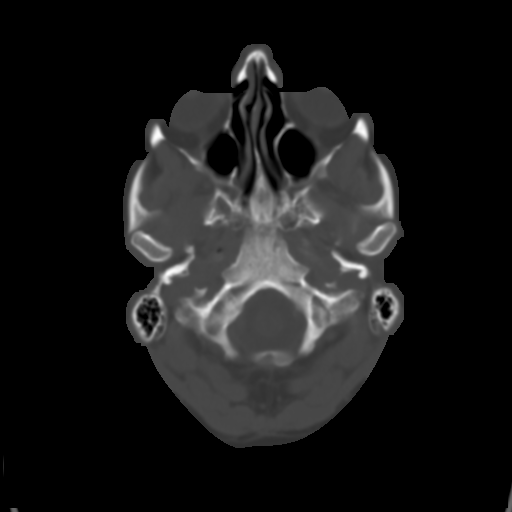
[im 6/32  brain]
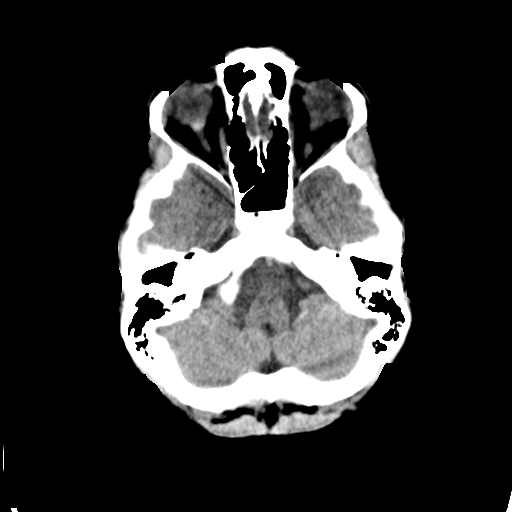
[im 9/32  brain]
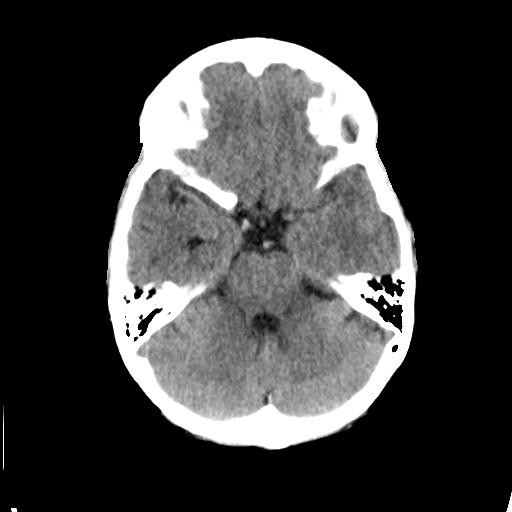
[im 11/32  brain]
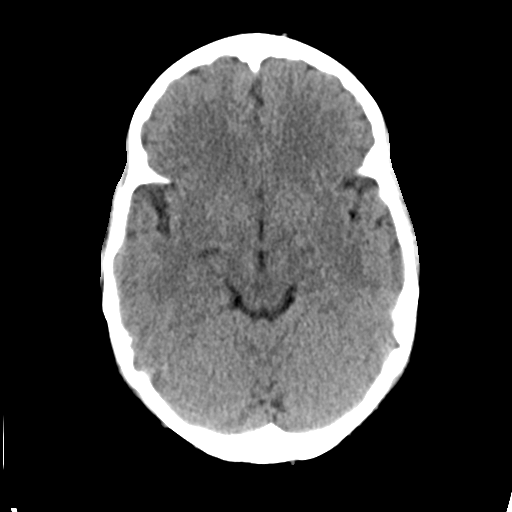
[im 14/32  brain]
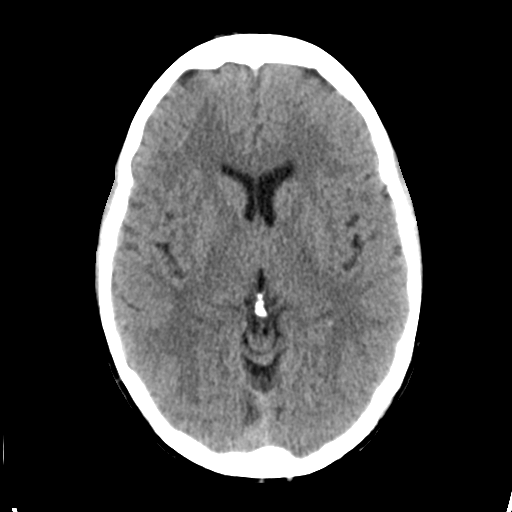
[im 14/32  bone]
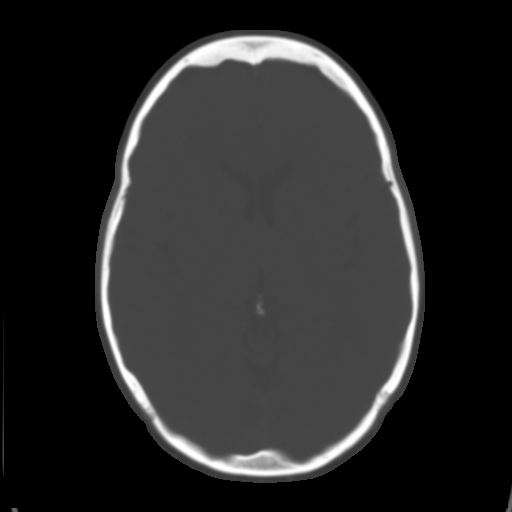
[im 18/32  brain]
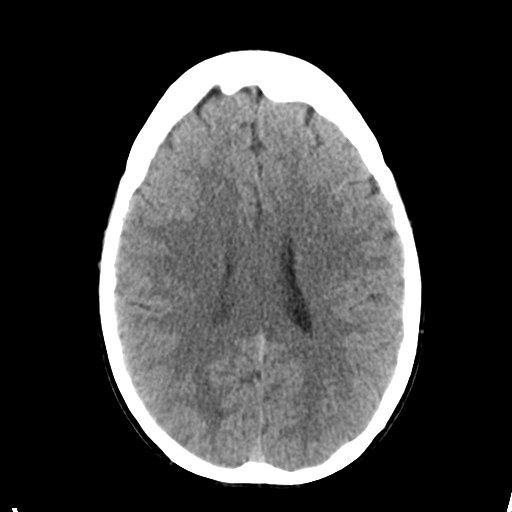
[im 21/32  brain]
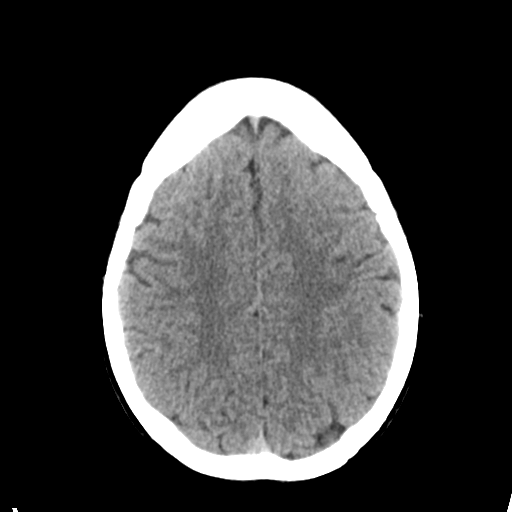
[im 24/32  brain]
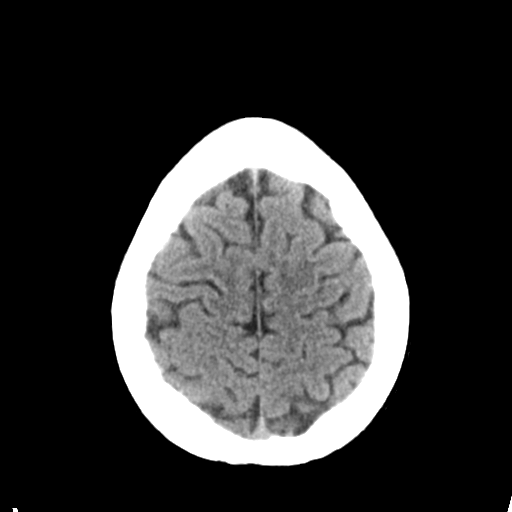
[im 26/32  brain]
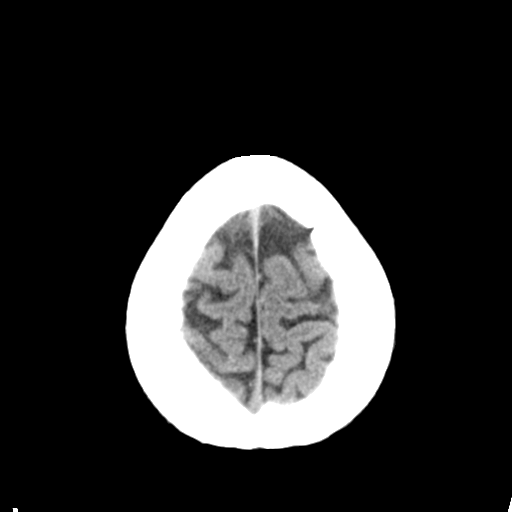
[im 26/32  bone]
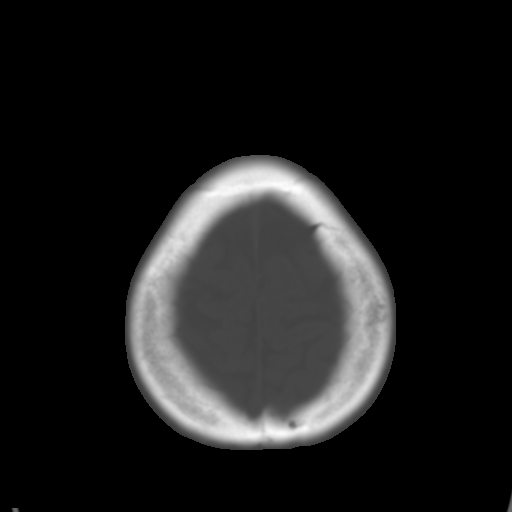
[im 29/32  brain]
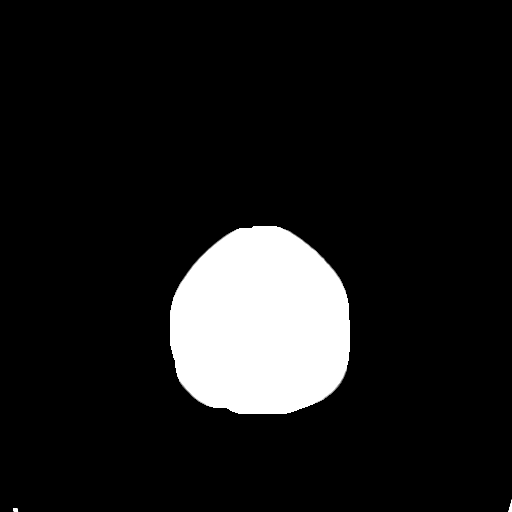

[16 of 47 positions shown; findings below may reference images not displayed]

FINDINGS: Brain: Normal anatomic configuration. No abnormal intra or
extra-axial mass lesion or fluid collection. No abnormal mass effect
or midline shift. No evidence of acute intracranial hemorrhage or
infarct. Ventricular size is normal. Cerebellum unremarkable.

Vascular: Unremarkable

Skull: Intact

Sinuses/Orbits: Bilateral maxillary antrectomy has been performed.
Small air-fluid level is seen within the left sphenoid sinus.
Remaining paranasal sinuses are clear. The orbits are unremarkable.

Other: Mastoid air cells and middle ear cavities are clear.
IMPRESSION: No acute intracranial abnormality.

Mild left sphenoid sinus disease.

## 2020-09-01 IMAGING — MR MR HEAD W/O CM
17 of 19 series · 38 of 48 positions shown · non-contrast
Comparison: Comparison made with prior head CT from earlier the
same day.

CLINICAL DATA: Initial evaluation for intermittent headaches,
dizziness, right hand numbness.

EXAM:
MRI HEAD WITHOUT CONTRAST
MRI CERVICAL SPINE WITHOUT CONTRAST
TECHNIQUE: Multiplanar, multiecho pulse sequences of the brain and surrounding
structures, and cervical spine, to include the craniocervical
junction and cervicothoracic junction, were obtained without
intravenous contrast.

[Series 5: DWI · axial · 3.0mm · 0.88mm/px · z∈[-120,+27]mm · 5 of 100 slices shown (1 of 4)]
[im 1/100]
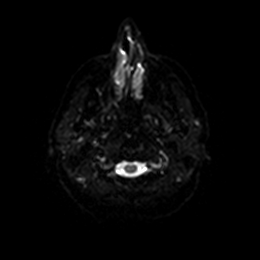
[im 25/100]
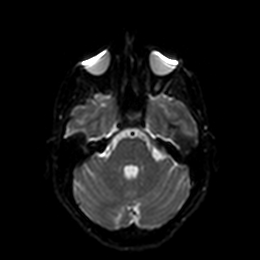
[im 50/100]
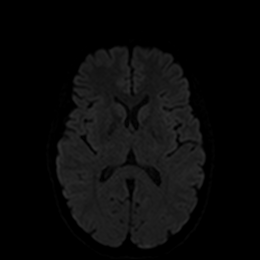
[im 75/100]
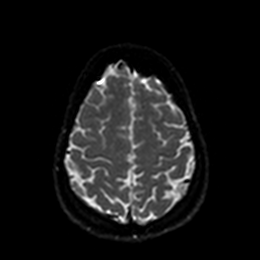
[im 100/100]
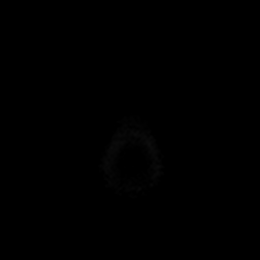

[Series 6: DWI · axial · 3.0mm · 0.88mm/px · z∈[-120,+27]mm · 3 of 50 slices shown (2 of 4)]
[im 1/50]
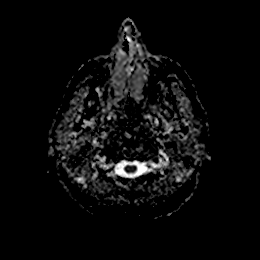
[im 25/50]
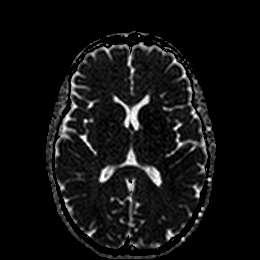
[im 50/50]
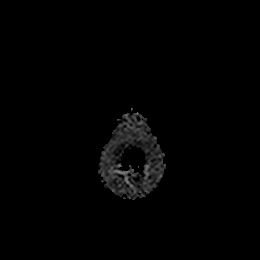

[Series 7: DWI · coronal · 4.0mm · 0.88mm/px · 4 of 68 slices shown (3 of 4)]
[im 1/68]
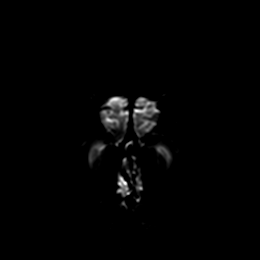
[im 23/68]
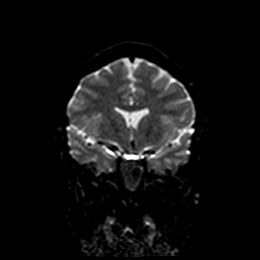
[im 45/68]
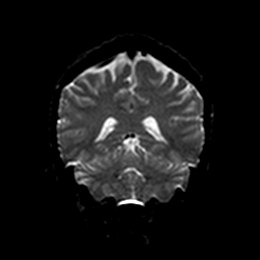
[im 68/68]
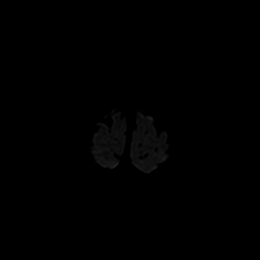

[Series 8: DWI · coronal · 4.0mm · 0.88mm/px · 2 of 34 slices shown (4 of 4)]
[im 1/34]
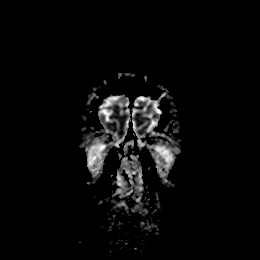
[im 34/34]
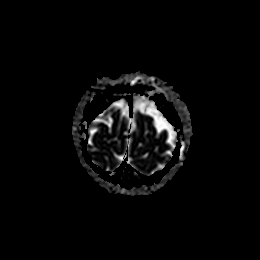

[Series 9: T1 · sagittal · 5.0mm · 0.75mm/px · 1 of 23 slices shown (1 of 2)]
[im 1/23]
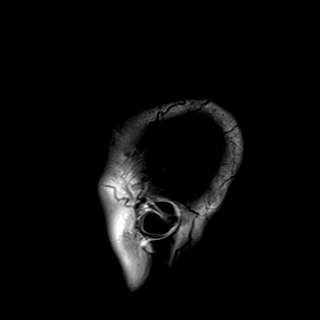

[Series 10: T2 · axial · 5.0mm · 0.72mm/px · 1 of 25 slices shown (1 of 4)]
[im 1/25]
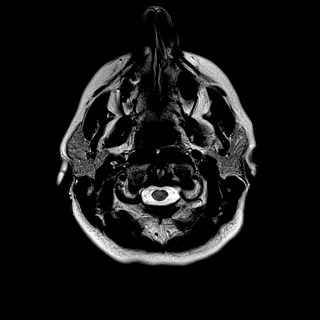

[Series 11: FLAIR · axial · 5.0mm · 0.45mm/px · 1 of 25 slices shown]
[im 1/25]
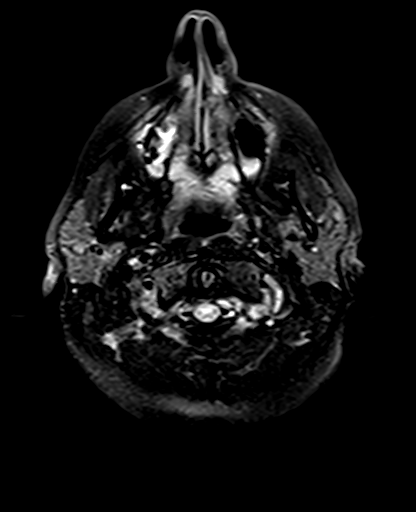

[Series 12: mag_images · axial · 3.0mm · 0.90mm/px · z∈[-137,+40]mm · 3 of 60 slices shown]
[im 1/60]
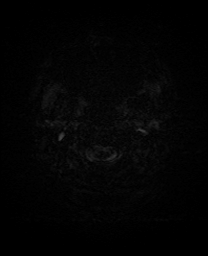
[im 30/60]
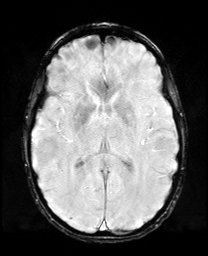
[im 60/60]
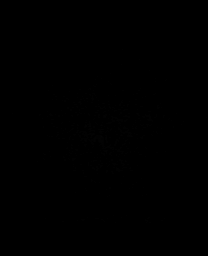

[Series 13: pha_images · axial · 3.0mm · 0.90mm/px · z∈[-137,+40]mm · 3 of 56 slices shown]
[im 1/56]
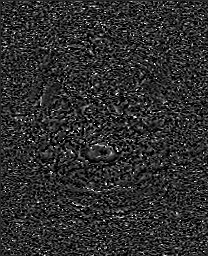
[im 28/56]
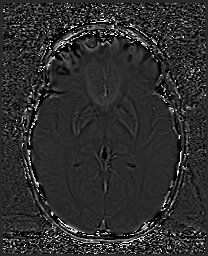
[im 56/56]
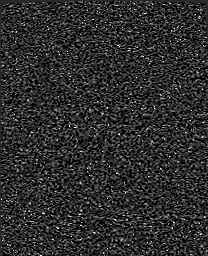

[Series 14: swi_images · axial · 3.0mm · 0.90mm/px · z∈[-137,+40]mm · 3 of 60 slices shown]
[im 1/60]
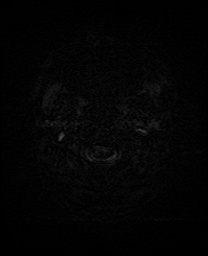
[im 30/60]
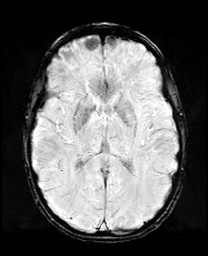
[im 60/60]
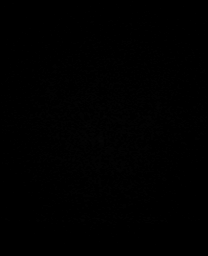

[Series 15: mip_images(sw) · axial · 24.0mm · 0.90mm/px · z∈[-126,+29]mm · 3 of 53 slices shown]
[im 1/53]
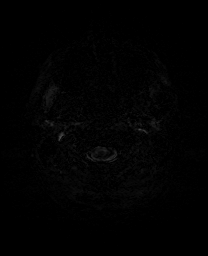
[im 27/53]
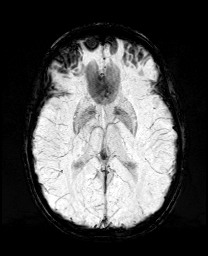
[im 53/53]
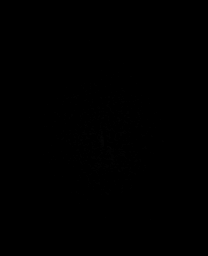

[Series 17: T2 · coronal · 5.0mm · 0.34mm/px · 2 of 29 slices shown (2 of 4)]
[im 1/29]
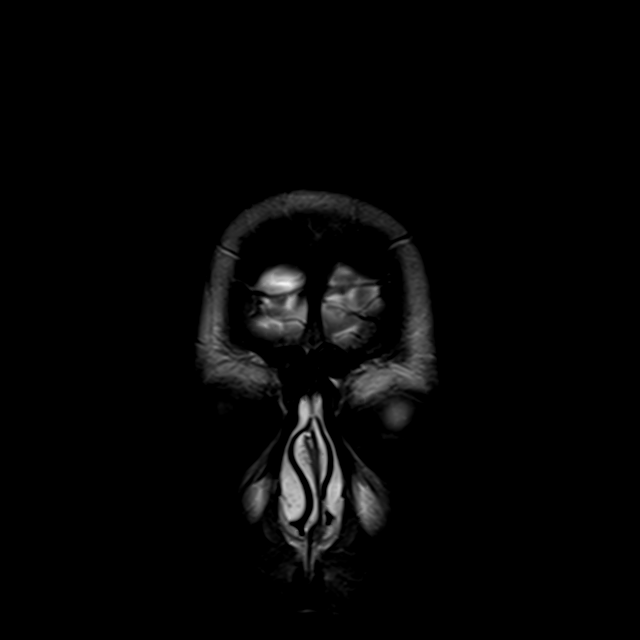
[im 29/29]
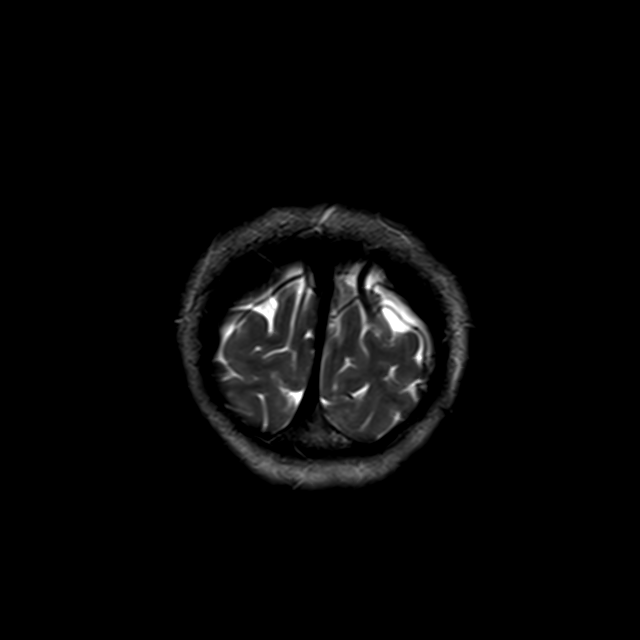

[Series 22: T2 · sagittal · 3.0mm · 0.69mm/px · 1 of 15 slices shown (3 of 4)]
[im 1/15]
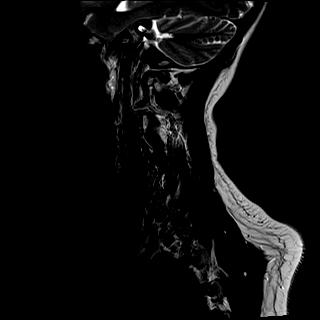

[Series 23: T1 · sagittal · 3.0mm · 0.69mm/px · 1 of 15 slices shown (2 of 2)]
[im 1/15]
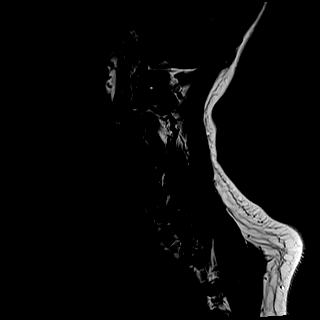

[Series 24: STIR · sagittal · 3.0mm · 0.86mm/px · 1 of 15 slices shown]
[im 1/15]
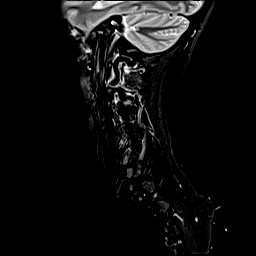

[Series 25: T2 · axial · 3.0mm · 0.66mm/px · z∈[-271,-149]mm · 2 of 40 slices shown (4 of 4)]
[im 1/40]
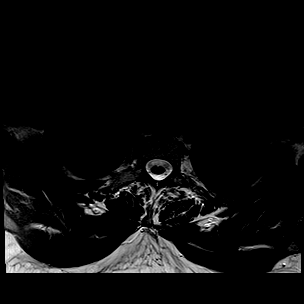
[im 40/40]
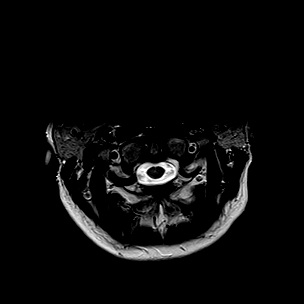

[Series 26: GRE · axial · 3.0mm · 0.39mm/px · z∈[-271,-149]mm · 2 of 40 slices shown]
[im 1/40]
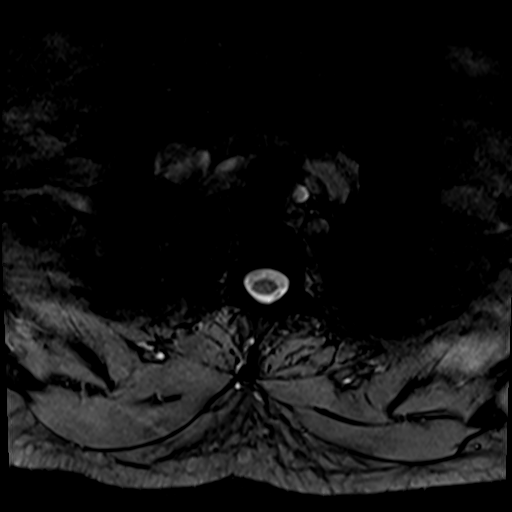
[im 40/40]
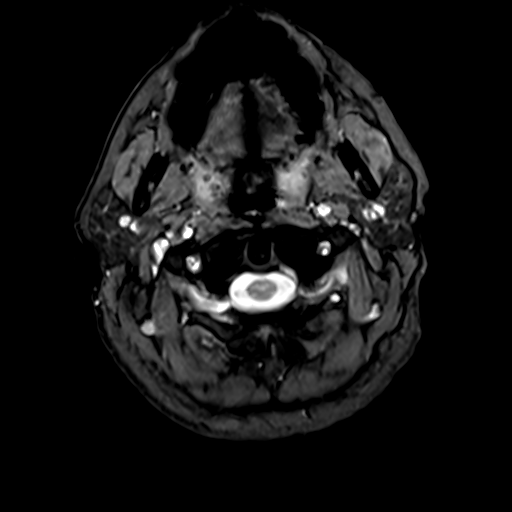

[38 of 48 positions shown; findings below may reference images not displayed]

FINDINGS: MRI HEAD FINDINGS

Brain: Cerebral volume within normal limits. No focal parenchymal
signal abnormality. No significant cerebral white matter disease for
age.

No abnormal foci of restricted diffusion to suggest acute or
subacute ischemia. Gray-white matter differentiation well
maintained. No encephalomalacia to suggest chronic cortical
infarction. No foci of susceptibility artifact to suggest acute or
chronic intracranial hemorrhage.

No mass lesion, midline shift or mass effect. Ventricles normal size
without hydrocephalus. No extra-axial fluid collection. Incidental
note made of a subcentimeter simple cyst at the posterior pituitary
gland, likely a small pars intermedius cyst, of doubtful
significance. Pituitary gland and suprasellar region otherwise
unremarkable. Midline structures intact and normal.

Vascular: Major intracranial vascular flow voids are maintained.

Skull and upper cervical spine: Craniocervical junction within
normal limits. Bone marrow signal intensity normal. No scalp soft
tissue abnormality.

Sinuses/Orbits: Globes and orbital soft tissues within normal
limits. Air-fluid level noted within the left sphenoid sinus.
Paranasal sinuses are otherwise clear. No mastoid effusion. Inner
ear structures grossly normal.

Other: None.

MRI CERVICAL SPINE FINDINGS

Alignment: Straightening with mild reversal of the normal cervical
lordosis. Underlying trace dextroscoliosis. Trace anterolisthesis of
C4 on C5 and T2 on T3.

Vertebrae: Vertebral body height maintained without acute or chronic
fracture. Bone marrow signal intensity within normal limits. No
discrete or worrisome osseous lesions. No abnormal marrow edema.

Cord: Normal signal and morphology.

Posterior Fossa, vertebral arteries, paraspinal tissues:
Craniocervical junction within normal limits. Paraspinous and
prevertebral soft tissues demonstrate no acute abnormality. Multiple
probable calcified tonsilliths noted within the palatine tonsils
bilaterally. Normal flow voids seen within the vertebral arteries
bilaterally.

Disc levels:

C2-C3: Tiny central disc protrusion. Minimal left greater than right
facet hypertrophy. No canal or foraminal stenosis.

C3-C4: Shallow central disc protrusion minimally indents the ventral
thecal sac. Mild bilateral uncovertebral and left-sided facet
hypertrophy. No spinal stenosis. Foramina remain patent.

C4-C5: Trace anterolisthesis. Mild disc bulge with uncovertebral
hypertrophy. Moderate right with mild left facet hypertrophy. No
spinal stenosis. Foramina remain patent.

C5-C6: Degenerative intervertebral disc space narrowing with diffuse
disc osteophyte complex. Flattening and partial effacement of the
ventral thecal sac with no more than mild spinal stenosis. No cord
impingement. Mild bilateral C6 foraminal narrowing.

C6-C7: Degenerative intervertebral disc space narrowing with diffuse
disc osteophyte, asymmetric to the left. Broad posterior component
flattens the ventral thecal sac, greater on the left. Mild spinal
stenosis without significant cord deformity. Moderate left C7
foraminal narrowing. Right neural foramina remains widely patent.

C7-T1: Normal interspace. Bilateral facet hypertrophy. No stenosis.

Visualized upper thoracic spine demonstrates no significant finding.
IMPRESSION: MRI HEAD IMPRESSION:

1. Normal brain MRI.  No acute intracranial abnormality.
2. Acute left sphenoid sinusitis.

MRI CERVICAL SPINE IMPRESSION:

1. Normal MRI of the cervical spinal cord.  No acute abnormality.
2. Degenerative spondylosis at C5-6 and C6-7 with resultant mild
spinal stenosis, with mild bilateral C6 and moderate left C7
foraminal narrowing.
3. Additional mild noncompressive disc bulging elsewhere within the
cervical spine without significant stenosis or neural impingement.

## 2020-09-01 IMAGING — MR MR CERVICAL SPINE W/O CM
16 of 18 series · 42 of 48 positions shown · non-contrast
Comparison: Comparison made with prior head CT from earlier the
same day.

CLINICAL DATA: Initial evaluation for intermittent headaches,
dizziness, right hand numbness.

EXAM:
MRI HEAD WITHOUT CONTRAST
MRI CERVICAL SPINE WITHOUT CONTRAST
TECHNIQUE: Multiplanar, multiecho pulse sequences of the brain and surrounding
structures, and cervical spine, to include the craniocervical
junction and cervicothoracic junction, were obtained without
intravenous contrast.

[Series 5: DWI · axial · 3.0mm · 0.88mm/px · z∈[-120,+27]mm · 6 of 100 slices shown (1 of 4)]
[im 1/100]
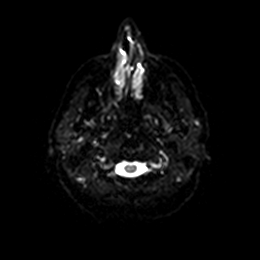
[im 20/100]
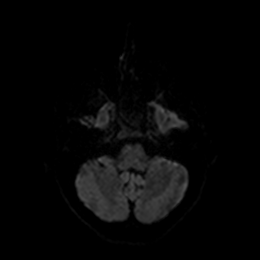
[im 40/100]
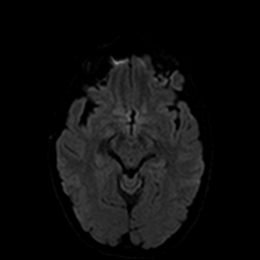
[im 60/100]
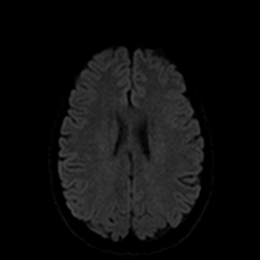
[im 80/100]
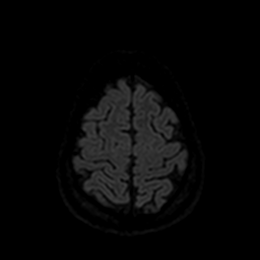
[im 100/100]
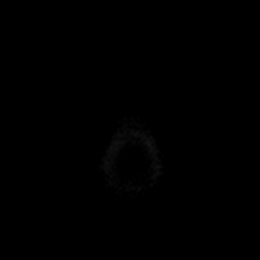

[Series 6: DWI · axial · 3.0mm · 0.88mm/px · z∈[-120,+27]mm · 3 of 50 slices shown (2 of 4)]
[im 1/50]
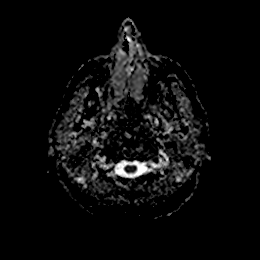
[im 25/50]
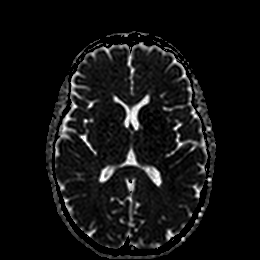
[im 50/50]
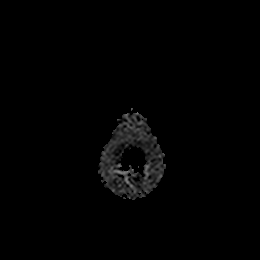

[Series 7: DWI · coronal · 4.0mm · 0.88mm/px · 4 of 68 slices shown (3 of 4)]
[im 1/68]
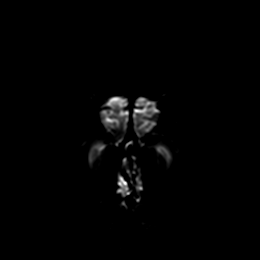
[im 23/68]
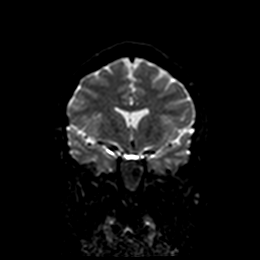
[im 45/68]
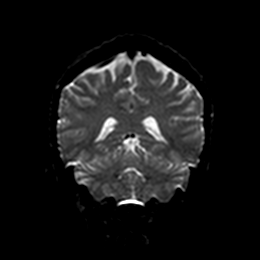
[im 68/68]
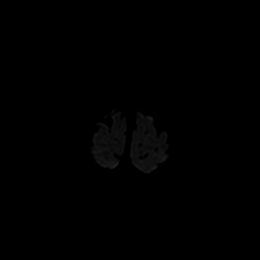

[Series 8: DWI · coronal · 4.0mm · 0.88mm/px · 2 of 34 slices shown (4 of 4)]
[im 1/34]
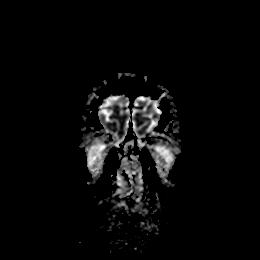
[im 34/34]
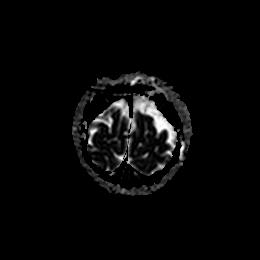

[Series 9: T1 · sagittal · 5.0mm · 0.75mm/px · 1 of 23 slices shown (1 of 2)]
[im 1/23]
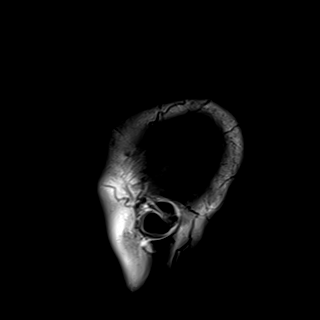

[Series 10: T2 · axial · 5.0mm · 0.72mm/px · z∈[-124,+19]mm · 2 of 25 slices shown (1 of 4)]
[im 1/25]
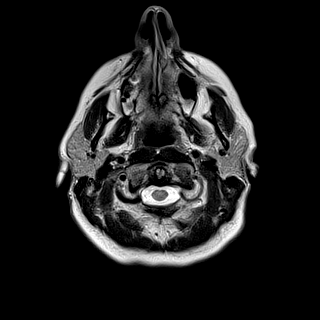
[im 25/25]
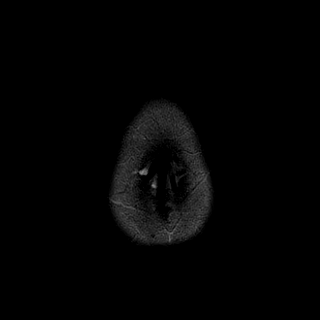

[Series 11: FLAIR · axial · 5.0mm · 0.45mm/px · z∈[-124,+20]mm · 2 of 25 slices shown]
[im 1/25]
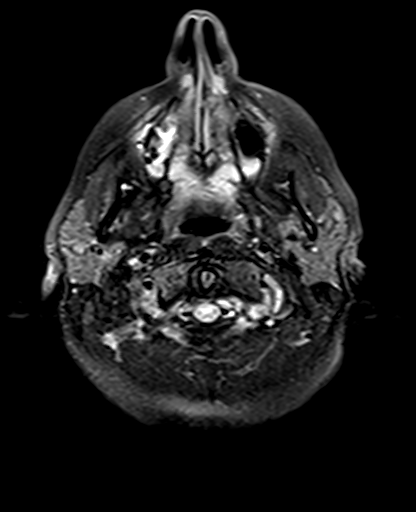
[im 25/25]
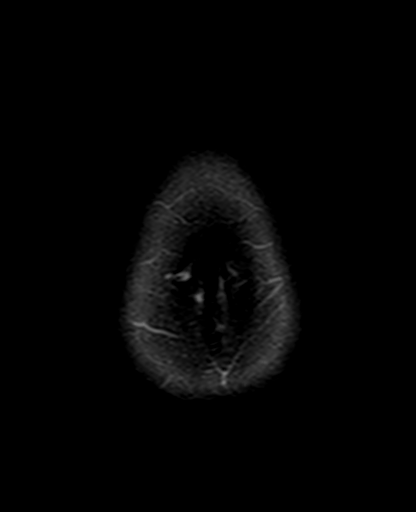

[Series 12: mag_images · axial · 3.0mm · 0.90mm/px · z∈[-137,+40]mm · 4 of 60 slices shown]
[im 1/60]
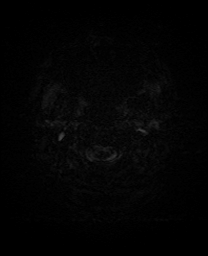
[im 20/60]
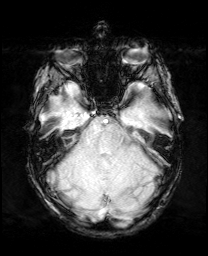
[im 40/60]
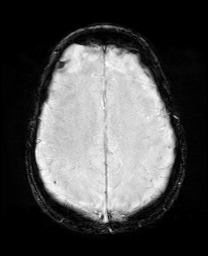
[im 60/60]
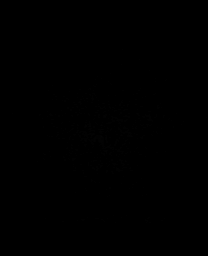

[Series 13: pha_images · axial · 3.0mm · 0.90mm/px · z∈[-137,+40]mm · 4 of 56 slices shown]
[im 1/56]
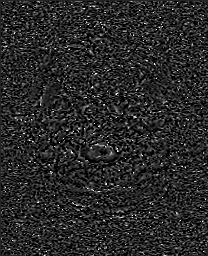
[im 19/56]
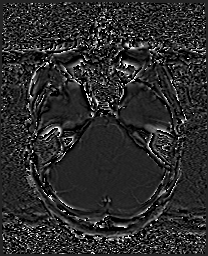
[im 37/56]
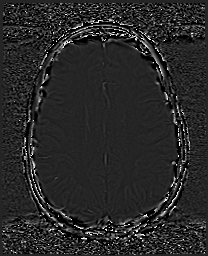
[im 56/56]
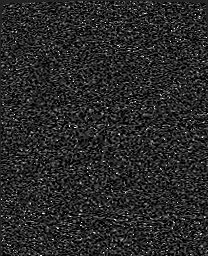

[Series 14: swi_images · axial · 3.0mm · 0.90mm/px · z∈[-137,+40]mm · 4 of 60 slices shown]
[im 1/60]
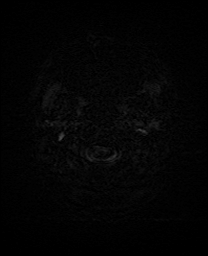
[im 20/60]
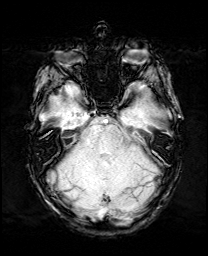
[im 40/60]
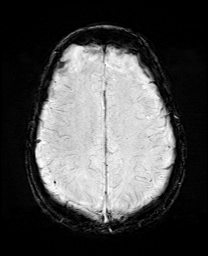
[im 60/60]
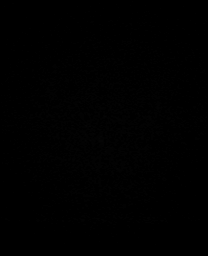

[Series 15: mip_images(sw) · axial · 24.0mm · 0.90mm/px · z∈[-126,+29]mm · 3 of 53 slices shown]
[im 1/53]
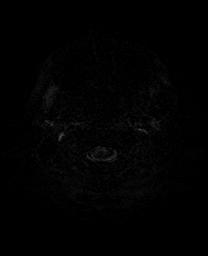
[im 27/53]
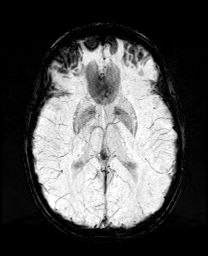
[im 53/53]
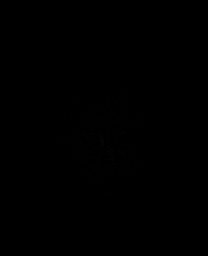

[Series 17: T2 · coronal · 5.0mm · 0.34mm/px · 2 of 29 slices shown (2 of 4)]
[im 1/29]
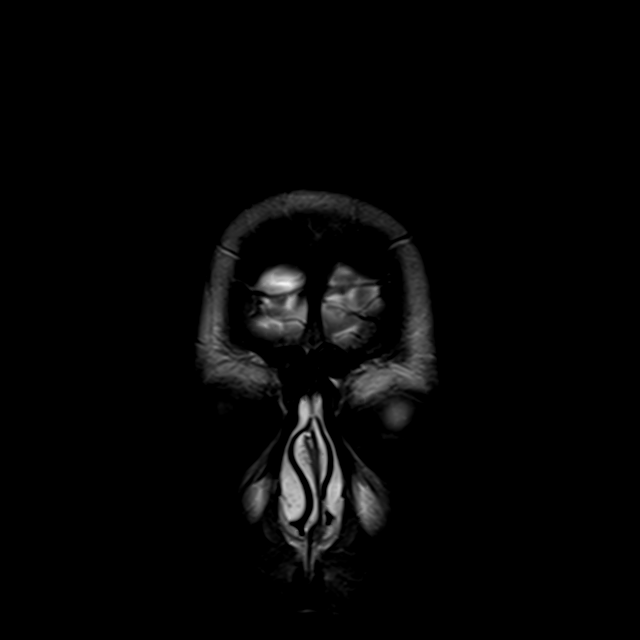
[im 29/29]
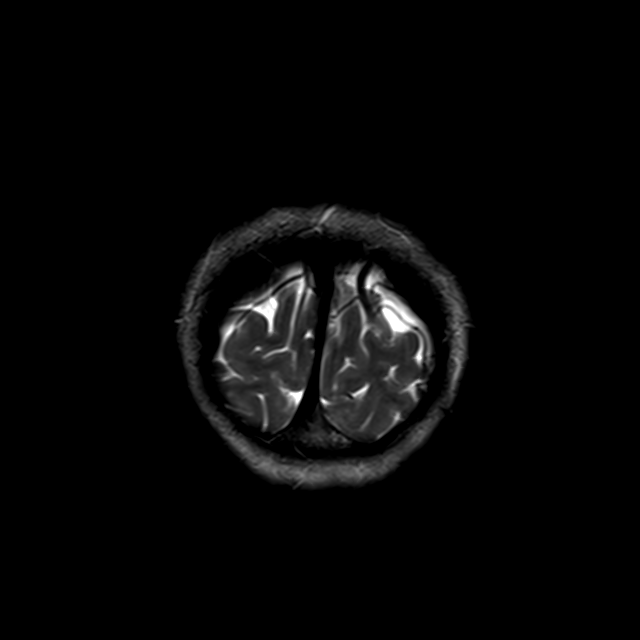

[Series 22: T2 · sagittal · 3.0mm · 0.69mm/px · 1 of 15 slices shown (3 of 4)]
[im 1/15]
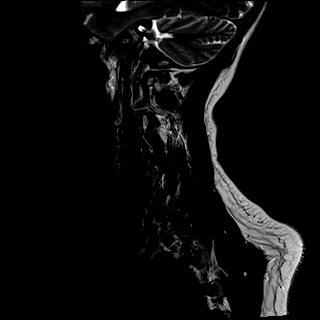

[Series 23: T1 · sagittal · 3.0mm · 0.69mm/px · 1 of 15 slices shown (2 of 2)]
[im 1/15]
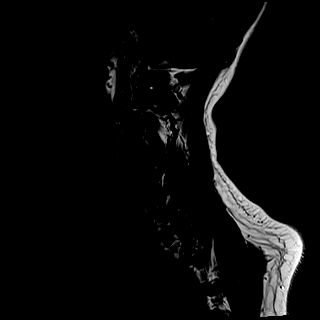

[Series 24: STIR · sagittal · 3.0mm · 0.86mm/px · 1 of 15 slices shown]
[im 1/15]
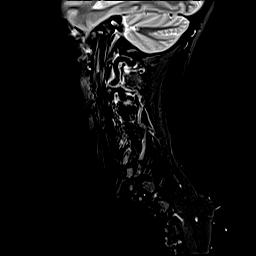

[Series 25: T2 · axial · 3.0mm · 0.66mm/px · z∈[-271,-149]mm · 2 of 40 slices shown (4 of 4)]
[im 1/40]
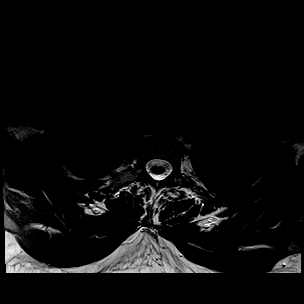
[im 40/40]
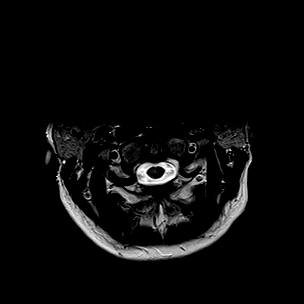

[42 of 48 positions shown; findings below may reference images not displayed]

FINDINGS: MRI HEAD FINDINGS

Brain: Cerebral volume within normal limits. No focal parenchymal
signal abnormality. No significant cerebral white matter disease for
age.

No abnormal foci of restricted diffusion to suggest acute or
subacute ischemia. Gray-white matter differentiation well
maintained. No encephalomalacia to suggest chronic cortical
infarction. No foci of susceptibility artifact to suggest acute or
chronic intracranial hemorrhage.

No mass lesion, midline shift or mass effect. Ventricles normal size
without hydrocephalus. No extra-axial fluid collection. Incidental
note made of a subcentimeter simple cyst at the posterior pituitary
gland, likely a small pars intermedius cyst, of doubtful
significance. Pituitary gland and suprasellar region otherwise
unremarkable. Midline structures intact and normal.

Vascular: Major intracranial vascular flow voids are maintained.

Skull and upper cervical spine: Craniocervical junction within
normal limits. Bone marrow signal intensity normal. No scalp soft
tissue abnormality.

Sinuses/Orbits: Globes and orbital soft tissues within normal
limits. Air-fluid level noted within the left sphenoid sinus.
Paranasal sinuses are otherwise clear. No mastoid effusion. Inner
ear structures grossly normal.

Other: None.

MRI CERVICAL SPINE FINDINGS

Alignment: Straightening with mild reversal of the normal cervical
lordosis. Underlying trace dextroscoliosis. Trace anterolisthesis of
C4 on C5 and T2 on T3.

Vertebrae: Vertebral body height maintained without acute or chronic
fracture. Bone marrow signal intensity within normal limits. No
discrete or worrisome osseous lesions. No abnormal marrow edema.

Cord: Normal signal and morphology.

Posterior Fossa, vertebral arteries, paraspinal tissues:
Craniocervical junction within normal limits. Paraspinous and
prevertebral soft tissues demonstrate no acute abnormality. Multiple
probable calcified tonsilliths noted within the palatine tonsils
bilaterally. Normal flow voids seen within the vertebral arteries
bilaterally.

Disc levels:

C2-C3: Tiny central disc protrusion. Minimal left greater than right
facet hypertrophy. No canal or foraminal stenosis.

C3-C4: Shallow central disc protrusion minimally indents the ventral
thecal sac. Mild bilateral uncovertebral and left-sided facet
hypertrophy. No spinal stenosis. Foramina remain patent.

C4-C5: Trace anterolisthesis. Mild disc bulge with uncovertebral
hypertrophy. Moderate right with mild left facet hypertrophy. No
spinal stenosis. Foramina remain patent.

C5-C6: Degenerative intervertebral disc space narrowing with diffuse
disc osteophyte complex. Flattening and partial effacement of the
ventral thecal sac with no more than mild spinal stenosis. No cord
impingement. Mild bilateral C6 foraminal narrowing.

C6-C7: Degenerative intervertebral disc space narrowing with diffuse
disc osteophyte, asymmetric to the left. Broad posterior component
flattens the ventral thecal sac, greater on the left. Mild spinal
stenosis without significant cord deformity. Moderate left C7
foraminal narrowing. Right neural foramina remains widely patent.

C7-T1: Normal interspace. Bilateral facet hypertrophy. No stenosis.

Visualized upper thoracic spine demonstrates no significant finding.
IMPRESSION: MRI HEAD IMPRESSION:

1. Normal brain MRI.  No acute intracranial abnormality.
2. Acute left sphenoid sinusitis.

MRI CERVICAL SPINE IMPRESSION:

1. Normal MRI of the cervical spinal cord.  No acute abnormality.
2. Degenerative spondylosis at C5-6 and C6-7 with resultant mild
spinal stenosis, with mild bilateral C6 and moderate left C7
foraminal narrowing.
3. Additional mild noncompressive disc bulging elsewhere within the
cervical spine without significant stenosis or neural impingement.

## 2020-09-01 MED ORDER — SODIUM CHLORIDE 0.9% FLUSH
3.0000 mL | Freq: Once | INTRAVENOUS | Status: AC
  Administered 2020-09-01: 3 mL via INTRAVENOUS

## 2020-09-01 MED ORDER — PREDNISONE 20 MG PO TABS: ORAL_TABLET | ORAL | 0 refills | Status: DC

## 2020-09-01 MED ORDER — DIAZEPAM 5 MG PO TABS: 5.0000 mg | ORAL_TABLET | Freq: Four times a day (QID) | ORAL | 0 refills | Status: DC | PRN

## 2020-09-01 MED ORDER — GABAPENTIN 300 MG PO CAPS
300.0000 mg | ORAL_CAPSULE | Freq: Once | ORAL | Status: AC
  Administered 2020-09-01: 300 mg via ORAL
  Filled 2020-09-01: qty 1

## 2020-09-01 MED ORDER — LORAZEPAM 2 MG/ML IJ SOLN
1.0000 mg | INTRAMUSCULAR | Status: DC | PRN
  Administered 2020-09-01: 1 mg via INTRAVENOUS
  Filled 2020-09-01: qty 1

## 2020-09-01 NOTE — Discharge Instructions (Signed)
Your intermittent arm and leg pain within sensation is likely due to spinal stenosis, which is nerve impingement of your cervical spinal nerve.  Take steroid and Valium as prescribed for symptom control.  Call and follow-up closely with your neurologist and as well as to orthopedics for further care.  You may continue taking neurontin as needed for nerve pain.

## 2020-09-01 NOTE — Telephone Encounter (Signed)
Pt's right arm for the last 30 mins is 50% numb, pt states it doesn't feel right and she would like a call back to discuss.  Pt states she would rather not go to the hospital, please call.

## 2020-09-01 NOTE — Telephone Encounter (Signed)
Called patient and advised Dr Leta Baptist ordered MRI cervical spine. She asked what to do if she develops other symptoms; I advised any life threatening such as difficulty breathing or swallowing, vision changes she go to ED immediately. She stated she has none of .  She will call tomorrow to see if any EMG have canceled so she can have hers tomorrow. She verbalized understanding, appreciation.

## 2020-09-01 NOTE — Telephone Encounter (Signed)
Patient called and stated she was at work,  suddenly got very dizzy, lost her train of thought. Heart rate and O2 sat are fine. She can't check BP. She has eaten, doesn't think she's dehydrated, didn't feel like she was going to pass out nor feel she was experiencing anxiety. She is fine now. I advised she monitor symptoms, may consider going to ED or Urgent Care if symptoms persist or worsen. Patient verbalized understanding, appreciation.

## 2020-09-01 NOTE — Telephone Encounter (Signed)
Called patient to advise her EMG/NCS can be rescheduled for tomorrow. She stated that she missed almost all her [place] today, will keep her 09/09/20 appointment. She asked that I call  Imaging and cancel her MRi Brain, MRI cervical spine since she had those done in ED today. I advised will cancel for her. Patient verbalized understanding, appreciation.

## 2020-09-01 NOTE — Telephone Encounter (Signed)
[employer] order sent to GI. They will obtain the auth for [employer] and reach out to the patient to schedule.

## 2020-09-01 NOTE — ED Notes (Signed)
ED Provider at bedside. PA Domenic Moras

## 2020-09-01 NOTE — Telephone Encounter (Signed)
Called patient who stated her right arm is half numb, no other symptoms, denies pain, feels like she got a shot to numb her arm. She has slight numbness of right leg. 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 I checked with Raquel Sarna; MRI brain ready to be scheduled with [facility]. I gave her their #; she'll call now to schedule. She asked if her spine can be imaged too. I advised will discuss with Dr Leta Baptist; advised it will require insurance authorization as well. I advised will let her know Dr Gladstone Lighter reply. Patient verbalized understanding, appreciation.

## 2020-09-01 NOTE — Telephone Encounter (Signed)
Patient is scheduled at GI for 09/19/20.

## 2020-09-01 NOTE — ED Provider Notes (Signed)
[facility] EMERGENCY DEPARTMENT Provider Note   CSN: [id number] Arrival date & time: 09/01/20  1110     History Chief Complaint  Patient presents with  . Dizziness  . Numbness  . Headache    Anne Jefferson is a 45 y.o. female.  The history is provided by the patient and medical records. No language interpreter was used.  Dizziness Associated symptoms: headaches   Headache Associated symptoms: dizziness      45 year old female significant history of anxiety, asthma, sent here from neurology for evaluation of numbness.  Patient reported acute onset of dizziness, difficulty thinking, along with intermittent pain and numbness to her extremities.  Patient report for nearly a year she has had intermittent sharp shooting pains to her legs and her arms with a occasional bouts of tingling and numbness sensation.  She also endorsed occasional bouts of blurred vision and within the past few weeks she also endorsed having sensation of dizziness in which she described more of an imbalance, and has had several falls due to that.  She also endorsed having recurrent headache to the back of her head.  She denies having fever, diplopia, runny nose sneezing coughing chest pain shortness of breath abdominal pain or increasing stress.  She denies any prior history of MS or any strong family history of neurological disease.  She denies alcohol or tobacco abuse.  She was seen by her neurologist yesterday for her complaint and was scheduled to have MRI as well as EMG test.  Her symptoms worsened today prompting this ER visit.  Patient has been fully vaccinated for COVID-19.  She denies increasing stress.  She denies any recent medication changes.  Past Medical History:  Diagnosis Date  . Abnormal Pap smear   . Anxiety    no meds  . Asthma    mild, no issue for years no  inhaler use except in winter  . Biliary colic   . Blood type, Rh negative   . Gallstones   . H/O dysmenorrhea    . Infertility, female   . Nausea and vomiting   . Pain    back and chest realted to gallbladder since november 2018  . PONV (postoperative nausea and vomiting)    severe, needs heavy anti nausea meds    Patient Active Problem List   Diagnosis Date Noted  . Shooting pain 07/29/2020  . Atopic dermatitis 04/01/2019  . Perennial and seasonal allergic rhinitis 09/17/2018  . Infertility, female   . Abnormal Pap smear   . H/O dysmenorrhea   . Mild intermittent asthma without complication   . Blood type, Rh negative   . METATARSALGIA 12/18/2007  . BUNIONS, LEFT FOOT 12/18/2007  . UNEQUAL LEG LENGTH 12/18/2007  . SPRAIN&STRAIN OF UNSPECIFIED SITE OF KNEE&LEG 11/13/2007    Past Surgical History:  Procedure Laterality Date  .  c sections     x 2  3 births last was twins  . CHOLECYSTECTOMY N/A 11/22/2017   Procedure: LAPAROSCOPIC CHOLECYSTECTOMY ERAS PATHWAY;  Surgeon: Kieth Brightly Arta Bruce, MD;  Location: [facility];  Service: General;  Laterality: N/A;  . DILATION AND CURETTAGE OF UTERUS  2011  . LEEP  12/23/2009  . WISDOM TOOTH EXTRACTION       OB History    Gravida  1   Para  1   Term  1   Preterm      AB      Living  1     SAB  IAB      Ectopic      Multiple      Live Births              Family History  Problem Relation Age of Onset  . Other Mother        Varicosities  . Diabetes Mother        gestational  . Allergic rhinitis Mother   . Diabetes Maternal Grandmother   . Skin cancer Maternal Grandmother     Social History   Tobacco Use  . Smoking status: Never Smoker  . Smokeless tobacco: Never Used  Vaping Use  . Vaping Use: Never used  Substance Use Topics  . Alcohol use: No  . Drug use: No    Home Medications Prior to Admission medications   Medication Sig Start Date End Date Taking? Authorizing Provider  albuterol (PROVENTIL HFA) 108 (90 Base) MCG/ACT inhaler Inhale 2 puffs into the lungs every 4 (four) hours as needed for wheezing  or shortness of breath. 07/29/20   Garnet Sierras, DO  Cholecalciferol (VITAMIN D3) 125 MCG (5000 UT) TBDP See admin instructions.    [provider]  EPINEPHrine (AUVI-Q) 0.3 mg/0.3 mL IJ SOAJ injection Inject 0.3 mg into the muscle as needed for anaphylaxis. 07/29/20   Garnet Sierras, DO  fexofenadine (ALLEGRA) 180 MG tablet 1 tablet Swallow whole with water; do not take with fruit juices.    [provider]  fluticasone (FLOVENT HFA) 110 MCG/ACT inhaler Inhale 2 puffs into the lungs 2 (two) times daily. 07/29/20   Garnet Sierras, DO  gabapentin (NEURONTIN) 300 MG capsule Take 300 mg by mouth 3 (three) times daily. 08/28/20   [provider]  mometasone (NASONEX) 50 MCG/ACT nasal spray Place 1 spray into the nose daily. 07/29/20   Garnet Sierras, DO    Allergies    Promethazine, Theophyllines, Mite (d. farinae), and Other  Review of Systems   Review of Systems  Neurological: Positive for dizziness and headaches.  All other systems reviewed and are negative.   Physical Exam Updated Vital Signs BP (!) 142/68   Pulse 83   Temp 98 F (36.7 C)   Resp 18   SpO2 97%   Physical Exam Vitals and nursing note reviewed.  Constitutional:      General: She is not in acute distress.    Appearance: She is well-developed and well-nourished.  HENT:     Head: Atraumatic.  Eyes:     Extraocular Movements:     Right eye: No nystagmus.     Left eye: No nystagmus.     Conjunctiva/sclera: Conjunctivae normal.  Cardiovascular:     Rate and Rhythm: Normal rate and regular rhythm.     Heart sounds: Normal heart sounds.  Pulmonary:     Effort: Pulmonary effort is normal.     Breath sounds: Normal breath sounds.  Abdominal:     General: Bowel sounds are normal.     Palpations: Abdomen is soft.  Musculoskeletal:        General: No tenderness.     Cervical back: Neck supple.  Skin:    Findings: No rash.  Neurological:     General: No focal deficit present.     Mental Status: She is  alert and oriented to person, place, and time.     GCS: GCS eye subscore is 4. GCS verbal subscore is 5. GCS motor subscore is 6.     Cranial Nerves: No  cranial nerve deficit.     Sensory: No sensory deficit.     Motor: Motor function is intact. No weakness or tremor.     Coordination: Coordination is intact.  Psychiatric:        Mood and Affect: Mood is anxious.     ED Results / Procedures / Treatments   Labs (all labs ordered are listed, but only abnormal results are displayed) Labs Reviewed  COMPREHENSIVE METABOLIC PANEL - Abnormal; Notable for the following components:      Result Value   Glucose, Bld 102 (*)    All other components within normal limits  PROTIME-INR  APTT  CBC  DIFFERENTIAL  I-STAT BETA HCG BLOOD, ED ([facility], [facility], [facility] ONLY)    EKG EKG Interpretation  Date/Time:  Wednesday September 01 2020 11:45:49 EST Ventricular Rate:  76 PR Interval:  124 QRS Duration: 86 QT Interval:  392 QTC Calculation: 441 R Axis:   77 Text Interpretation: Normal sinus rhythm Normal ECG No previous tracing Confirmed by Blanchie Dessert [phone]) on 09/01/2020 12:58:31 PM   Radiology CT HEAD WO CONTRAST  Result Date: 09/01/2020 CLINICAL DATA:  Dizziness, right arm neuropathy EXAM: CT HEAD WITHOUT CONTRAST TECHNIQUE: Contiguous axial images were obtained from the base of the skull through the vertex without intravenous contrast. COMPARISON:  None. FINDINGS: Brain: Normal anatomic configuration. No abnormal intra or extra-axial mass lesion or fluid collection. No abnormal mass effect or midline shift. No evidence of acute intracranial hemorrhage or infarct. Ventricular size is normal. Cerebellum unremarkable. Vascular: Unremarkable Skull: Intact Sinuses/Orbits: Bilateral maxillary antrectomy has been performed. Small air-fluid level is seen within the left sphenoid sinus. Remaining paranasal sinuses are clear. The orbits are unremarkable. Other: Mastoid air cells and middle ear cavities are  clear. IMPRESSION: No acute intracranial abnormality. Mild left sphenoid sinus disease. Electronically Signed   By: Fidela Salisbury MD   On: 09/01/2020 12:53   MR BRAIN WO CONTRAST  Result Date: 09/01/2020 CLINICAL DATA:  Initial evaluation for intermittent headaches, dizziness, right hand numbness. EXAM: MRI HEAD WITHOUT CONTRAST MRI CERVICAL SPINE WITHOUT CONTRAST TECHNIQUE: Multiplanar, multiecho pulse sequences of the brain and surrounding structures, and cervical spine, to include the craniocervical junction and cervicothoracic junction, were obtained without intravenous contrast. COMPARISON:  Comparison made with prior head CT from earlier the same day. FINDINGS: MRI HEAD FINDINGS Brain: Cerebral volume within normal limits. No focal parenchymal signal abnormality. No significant cerebral white matter disease for age. No abnormal foci of restricted diffusion to suggest acute or subacute ischemia. Gray-white matter differentiation well maintained. No encephalomalacia to suggest chronic cortical infarction. No foci of susceptibility artifact to suggest acute or chronic intracranial hemorrhage. No mass lesion, midline shift or mass effect. Ventricles normal size without hydrocephalus. No extra-axial fluid collection. Incidental note made of a subcentimeter simple cyst at the posterior pituitary gland, likely a small pars intermedius cyst, of doubtful significance. Pituitary gland and suprasellar region otherwise unremarkable. Midline structures intact and normal. Vascular: Major intracranial vascular flow voids are maintained. Skull and upper cervical spine: Craniocervical junction within normal limits. Bone marrow signal intensity normal. No scalp soft tissue abnormality. Sinuses/Orbits: Globes and orbital soft tissues within normal limits. Air-fluid level noted within the left sphenoid sinus. Paranasal sinuses are otherwise clear. No mastoid effusion. Inner ear structures grossly normal. Other: None. MRI  CERVICAL SPINE FINDINGS Alignment: Straightening with mild reversal of the normal cervical lordosis. Underlying trace dextroscoliosis. Trace anterolisthesis of C4 on C5 and T2 on T3. Vertebrae: Vertebral body height  maintained without acute or chronic fracture. Bone marrow signal intensity within normal limits. No discrete or worrisome osseous lesions. No abnormal marrow edema. Cord: Normal signal and morphology. Posterior Fossa, vertebral arteries, paraspinal tissues: Craniocervical junction within normal limits. Paraspinous and prevertebral soft tissues demonstrate no acute abnormality. Multiple probable calcified tonsilliths noted within the palatine tonsils bilaterally. Normal flow voids seen within the vertebral arteries bilaterally. Disc levels: C2-C3: Tiny central disc protrusion. Minimal left greater than right facet hypertrophy. No canal or foraminal stenosis. C3-C4: Shallow central disc protrusion minimally indents the ventral thecal sac. Mild bilateral uncovertebral and left-sided facet hypertrophy. No spinal stenosis. Foramina remain patent. C4-C5: Trace anterolisthesis. Mild disc bulge with uncovertebral hypertrophy. Moderate right with mild left facet hypertrophy. No spinal stenosis. Foramina remain patent. C5-C6: Degenerative intervertebral disc space narrowing with diffuse disc osteophyte complex. Flattening and partial effacement of the ventral thecal sac with no more than mild spinal stenosis. No cord impingement. Mild bilateral C6 foraminal narrowing. C6-C7: Degenerative intervertebral disc space narrowing with diffuse disc osteophyte, asymmetric to the left. Broad posterior component flattens the ventral thecal sac, greater on the left. Mild spinal stenosis without significant cord deformity. Moderate left C7 foraminal narrowing. Right neural foramina remains widely patent. C7-T1: Normal interspace. Bilateral facet hypertrophy. No stenosis. Visualized upper thoracic spine demonstrates no  significant finding. IMPRESSION: MRI HEAD IMPRESSION: 1. Normal brain MRI.  No acute intracranial abnormality. 2. Acute left sphenoid sinusitis. MRI CERVICAL SPINE IMPRESSION: 1. Normal MRI of the cervical spinal cord.  No acute abnormality. 2. Degenerative spondylosis at C5-6 and C6-7 with resultant mild spinal stenosis, with mild bilateral C6 and moderate left C7 foraminal narrowing. 3. Additional mild noncompressive disc bulging elsewhere within the cervical spine without significant stenosis or neural impingement. Electronically Signed   By: Jeannine Boga M.D.   On: 09/01/2020 15:24   MR Cervical Spine Wo Contrast  Result Date: 09/01/2020 CLINICAL DATA:  Initial evaluation for intermittent headaches, dizziness, right hand numbness. EXAM: MRI HEAD WITHOUT CONTRAST MRI CERVICAL SPINE WITHOUT CONTRAST TECHNIQUE: Multiplanar, multiecho pulse sequences of the brain and surrounding structures, and cervical spine, to include the craniocervical junction and cervicothoracic junction, were obtained without intravenous contrast. COMPARISON:  Comparison made with prior head CT from earlier the same day. FINDINGS: MRI HEAD FINDINGS Brain: Cerebral volume within normal limits. No focal parenchymal signal abnormality. No significant cerebral white matter disease for age. No abnormal foci of restricted diffusion to suggest acute or subacute ischemia. Gray-white matter differentiation well maintained. No encephalomalacia to suggest chronic cortical infarction. No foci of susceptibility artifact to suggest acute or chronic intracranial hemorrhage. No mass lesion, midline shift or mass effect. Ventricles normal size without hydrocephalus. No extra-axial fluid collection. Incidental note made of a subcentimeter simple cyst at the posterior pituitary gland, likely a small pars intermedius cyst, of doubtful significance. Pituitary gland and suprasellar region otherwise unremarkable. Midline structures intact and normal.  Vascular: Major intracranial vascular flow voids are maintained. Skull and upper cervical spine: Craniocervical junction within normal limits. Bone marrow signal intensity normal. No scalp soft tissue abnormality. Sinuses/Orbits: Globes and orbital soft tissues within normal limits. Air-fluid level noted within the left sphenoid sinus. Paranasal sinuses are otherwise clear. No mastoid effusion. Inner ear structures grossly normal. Other: None. MRI CERVICAL SPINE FINDINGS Alignment: Straightening with mild reversal of the normal cervical lordosis. Underlying trace dextroscoliosis. Trace anterolisthesis of C4 on C5 and T2 on T3. Vertebrae: Vertebral body height maintained without acute or chronic fracture. Bone marrow signal intensity  within normal limits. No discrete or worrisome osseous lesions. No abnormal marrow edema. Cord: Normal signal and morphology. Posterior Fossa, vertebral arteries, paraspinal tissues: Craniocervical junction within normal limits. Paraspinous and prevertebral soft tissues demonstrate no acute abnormality. Multiple probable calcified tonsilliths noted within the palatine tonsils bilaterally. Normal flow voids seen within the vertebral arteries bilaterally. Disc levels: C2-C3: Tiny central disc protrusion. Minimal left greater than right facet hypertrophy. No canal or foraminal stenosis. C3-C4: Shallow central disc protrusion minimally indents the ventral thecal sac. Mild bilateral uncovertebral and left-sided facet hypertrophy. No spinal stenosis. Foramina remain patent. C4-C5: Trace anterolisthesis. Mild disc bulge with uncovertebral hypertrophy. Moderate right with mild left facet hypertrophy. No spinal stenosis. Foramina remain patent. C5-C6: Degenerative intervertebral disc space narrowing with diffuse disc osteophyte complex. Flattening and partial effacement of the ventral thecal sac with no more than mild spinal stenosis. No cord impingement. Mild bilateral C6 foraminal narrowing.  C6-C7: Degenerative intervertebral disc space narrowing with diffuse disc osteophyte, asymmetric to the left. Broad posterior component flattens the ventral thecal sac, greater on the left. Mild spinal stenosis without significant cord deformity. Moderate left C7 foraminal narrowing. Right neural foramina remains widely patent. C7-T1: Normal interspace. Bilateral facet hypertrophy. No stenosis. Visualized upper thoracic spine demonstrates no significant finding. IMPRESSION: MRI HEAD IMPRESSION: 1. Normal brain MRI.  No acute intracranial abnormality. 2. Acute left sphenoid sinusitis. MRI CERVICAL SPINE IMPRESSION: 1. Normal MRI of the cervical spinal cord.  No acute abnormality. 2. Degenerative spondylosis at C5-6 and C6-7 with resultant mild spinal stenosis, with mild bilateral C6 and moderate left C7 foraminal narrowing. 3. Additional mild noncompressive disc bulging elsewhere within the cervical spine without significant stenosis or neural impingement. Electronically Signed   By: Jeannine Boga M.D.   On: 09/01/2020 15:24    Procedures Procedures   Medications Ordered in ED Medications  LORazepam (ATIVAN) injection 1 mg (1 mg Intravenous Given 09/01/20 1339)  gabapentin (NEURONTIN) capsule 300 mg (has no administration in time range)  sodium chloride flush (NS) 0.9 % injection 3 mL (3 mLs Intravenous Given 09/01/20 1340)    ED Course  I have reviewed the triage vital signs and the nursing notes.  Pertinent labs & imaging results that were available during my care of the patient were reviewed by me and considered in my medical decision making (see chart for details).    MDM Rules/Calculators/A&P                          BP 114/87   Pulse 72   Temp 98 F (36.7 C)   Resp (!) 21   SpO2 97%   Final Clinical Impression(s) / ED Diagnoses Final diagnoses:  Cervical spinal stenosis    Rx / DC Orders ED Discharge Orders         Ordered    predniSONE (DELTASONE) 20 MG tablet         09/01/20 1614    diazepam (VALIUM) 5 MG tablet  Every 6 hours PRN        09/01/20 1614         1:31 PM Patient report having intermittent numbness and weakness to her extremities, having dizziness, blurry vision, and recurrent falls.  Symptom has been ongoing issue for nearly a year.  States she was seen by ophthalmology for vision changes recently and was told that it was secondary to dry eye.  She was also seen by neurology, Dr. Leta Baptist and did have schedule  brain and cervical spine MRI.  That has not been done yet.  Here her head CT scan is unremarkable, will check basic labs, will obtain head and cervical spine MRI to rule out acute problem such as MS.  No appreciable neuro deficit on exam.  3:40 PM MRI of the head and cervical spine was obtained.  Normal brain MRI without any acute intracranial abnormalities.  Acute left sphenoid sinusitis were noted.  MRI of the cervical spine demonstrate normal MRI of the cervical spinal cord.  There are degenerative spondylosis and mild spinal stenosis along with foraminal narrowing at the level of C6-C7.  There are mild noncompressive disc bulging elsewhere within the cervical spine without significant stenosis or neural impingement.  Perhaps the stenosis may contribute to patient's intermittent numbness and pain in her arms and legs.  Fortunately no other acute concerning findings were noted.  Pt has been seen by neurology and by hematology and have had extensive work up.  Will d/c home with steroid taper and orthopedist f/u for further care.  Return precaution given.  Care discussed with Dr. Maryan Rued.    Domenic Moras, PA-C 09/01/20 1619    Blanchie Dessert, MD 09/06/20 [phone]

## 2020-09-01 NOTE — ED Notes (Signed)
To MRI via gurney

## 2020-09-01 NOTE — ED Triage Notes (Signed)
Pt reports intermittent headache, dizziness and right hand numbness for the past few weeks but today she had numbness in her entire arm lasting about 1.5 hrs that caused her to come here. Pt seen by outpt neurologist and has outpt Ct ordered. Pt ambulatory. No neuro deficits noted in triage at this time

## 2020-09-01 NOTE — Addendum Note (Signed)
Addended by: Andrey Spearman R on: 09/01/2020 09:25 AM   Modules accepted: Orders

## 2020-09-01 NOTE — Telephone Encounter (Signed)
Orders Placed This Encounter  Procedures  . MR CERVICAL SPINE W WO CONTRAST    Penni Bombard, MD 09/29/1827, 9:37 AM Certified in Neurology, Neurophysiology and Neuroimaging  [facility] [address] [place], [place] [address] [phone]

## 2020-09-02 ENCOUNTER — Encounter: Payer: Self-pay | Admitting: Podiatry

## 2020-09-06 NOTE — Telephone Encounter (Signed)
Patient had MRI Anne Jefferson and Cervical spine wo contrast at the ER on 09/01/20.

## 2020-09-07 DIAGNOSIS — I776 Arteritis, unspecified: Secondary | ICD-10-CM | POA: Diagnosis not present

## 2020-09-07 DIAGNOSIS — M79641 Pain in right hand: Secondary | ICD-10-CM | POA: Diagnosis not present

## 2020-09-07 DIAGNOSIS — M792 Neuralgia and neuritis, unspecified: Secondary | ICD-10-CM | POA: Diagnosis not present

## 2020-09-07 DIAGNOSIS — M79606 Pain in leg, unspecified: Secondary | ICD-10-CM | POA: Diagnosis not present

## 2020-09-09 ENCOUNTER — Other Ambulatory Visit: Payer: Self-pay

## 2020-09-09 ENCOUNTER — Encounter (INDEPENDENT_AMBULATORY_CARE_PROVIDER_SITE_OTHER): Payer: 59 | Admitting: Diagnostic Neuroimaging

## 2020-09-09 ENCOUNTER — Ambulatory Visit (INDEPENDENT_AMBULATORY_CARE_PROVIDER_SITE_OTHER): Payer: 59 | Admitting: Diagnostic Neuroimaging

## 2020-09-09 DIAGNOSIS — R200 Anesthesia of skin: Secondary | ICD-10-CM

## 2020-09-09 DIAGNOSIS — M79642 Pain in left hand: Secondary | ICD-10-CM | POA: Diagnosis not present

## 2020-09-09 DIAGNOSIS — Z0289 Encounter for other administrative examinations: Secondary | ICD-10-CM

## 2020-09-09 DIAGNOSIS — R519 Headache, unspecified: Secondary | ICD-10-CM

## 2020-09-09 DIAGNOSIS — R52 Pain, unspecified: Secondary | ICD-10-CM

## 2020-09-09 DIAGNOSIS — M79641 Pain in right hand: Secondary | ICD-10-CM | POA: Diagnosis not present

## 2020-09-09 NOTE — Progress Notes (Signed)
Reviewed results with patient.  EMG testing is normal.  MRI of the brain is normal.  MRI of the cervical spine essentially normal except for mild degenerative changes, not likely enough to explain the diffuse constellation of symptoms including headache, numbness, pain, dizziness and lightheadedness.  No clear unifying neurologic etiology for patient's symptoms.  No definite migrainous features.  Importantly we have ruled out more serious central nervous system and peripheral nervous system disorders.  At this point patient is mainly complaining of pain affecting her right upper extremity.  Recommend patient to follow-up with rheumatology, PCP and consider sports medicine evaluation.  She may continue over-the-counter medications and gabapentin for symptom control.  Penni Bombard, MD 08/19/5168, 0:17 PM Certified in Neurology, Neurophysiology and Neuroimaging  [facility] [address] [place], [place] [address] [phone]

## 2020-09-09 NOTE — Procedures (Signed)
[facility]  NCS (NERVE CONDUCTION STUDY) WITH EMG (ELECTROMYOGRAPHY) REPORT   STUDY DATE: 09/09/20 PATIENT NAME: Anne Jefferson DOB: [birth year] MRN: [id number]  ORDERING CLINICIAN: Andrey Spearman, MD   TECHNOLOGIST: Sherre Scarlet ELECTROMYOGRAPHER: Earlean Polka. Jaina Morin, MD  CLINICAL INFORMATION: 45 year old female with pain, numbness, headaches, dizziness.  FINDINGS: NERVE CONDUCTION STUDY:  Right median, right ulnar, right peroneal and right tibial motor responses are normal.  Right peroneal motor response stimulation at the fibular head was suboptimal, may be due to technical artifact.  Right sural, right superficial peroneal, right median and right ulnar sensory responses are normal, except suboptimal response of right superficial peroneal sensory response, may be due to technical artifact.  Right ulnar and right tibial F wave latencies are normal.   NEEDLE ELECTROMYOGRAPHY:  Needle examination of right upper extremity is normal.  IMPRESSION:   Normal study.  No electrodiagnostic evidence of large fiber neuropathy at this time    INTERPRETING PHYSICIAN:  Penni Bombard, MD Certified in Neurology, Neurophysiology and Neuroimaging  [facility] [address], [place], [place] [address] [phone]   [facility]    Nerve / Sites Muscle Latency Ref. Amplitude Ref. Rel Amp Segments Distance Velocity Ref. Area    ms ms mV mV %  cm m/s m/s mVms  R Median - APB     Wrist APB 2.7 ?4.4 7.7 ?4.0 100 Wrist - APB 7   28.8     Upper arm APB 6.0  7.9  102 Upper arm - Wrist 19 58 ?49 28.5  R Ulnar - ADM     Wrist ADM 2.3 ?3.3 11.0 ?6.0 100 Wrist - ADM 7   35.6     B.Elbow ADM 5.0  10.7  97 B.Elbow - Wrist 16 60 ?49 33.1     A.Elbow ADM 6.6  11.0  103 A.Elbow - B.Elbow 10 62 ?49 33.5  R Peroneal - EDB     Ankle EDB 4.7 ?6.5 4.7 ?2.0 100 Ankle - EDB 9   15.8     Fib head EDB 9.7  0.7  15.4 Fib head - Ankle 23 46 ?44 2.0      Pop fossa EDB 11.7  4.3  599 Pop fossa - Fib head 10 51 ?44 15.8         Pop fossa - Ankle      R Tibial - AH     Ankle AH 3.6 ?5.8 11.3 ?4.0 100 Ankle - AH 9   21.3     Pop fossa AH 11.5  4.3  38.1 Pop fossa - Ankle 33 41 ?41 11.8             SNC    Nerve / Sites Rec. Site Peak Lat Ref.  Amp Ref. Segments Distance    ms ms V V  cm  R Sural - Ankle (Calf)     Calf Ankle 2.7 ?4.4 7 ?6 Calf - Ankle 14  R Superficial peroneal - Ankle     Lat leg Ankle 2.8 ?4.4 2 ?6 Lat leg - Ankle 14  R Median - Orthodromic (Dig II, Mid palm)     Dig II Wrist 2.6 ?3.4 13 ?10 Dig II - Wrist 13  R Ulnar - Orthodromic, (Dig V, Mid palm)     Dig V Wrist 2.4 ?3.1 13 ?5 Dig V - Wrist 39             F  Wave  Nerve F Lat Ref.   ms ms  R Ulnar - ADM 24.0 ?32.0  R Tibial - AH 47.0 ?56.0         EMG Summary Table    Spontaneous MUAP Recruitment  Muscle IA Fib PSW Fasc Other Amp Dur. Poly Pattern  R. Deltoid Normal None None None _______ Normal Normal Normal Normal  R. Biceps brachii Normal None None None _______ Normal Normal Normal Normal  R. Triceps brachii Normal None None None _______ Normal Normal Normal Normal  R. Flexor carpi radialis Normal None None None _______ Normal Normal Normal Normal  R. First dorsal interosseous Normal None None None _______ Normal Normal Normal Normal

## 2020-09-11 LAB — CBC WITH DIFFERENTIAL/PLATELET
Basophils Absolute: 0 10*3/uL (ref 0.0–0.2)
Basos: 1 %
EOS (ABSOLUTE): 0.2 10*3/uL (ref 0.0–0.4)
Eos: 3 %
Hematocrit: 40.3 % (ref 34.0–46.6)
Hemoglobin: 13.2 g/dL (ref 11.1–15.9)
Immature Grans (Abs): 0 10*3/uL (ref 0.0–0.1)
Immature Granulocytes: 0 %
Lymphocytes Absolute: 1.5 10*3/uL (ref 0.7–3.1)
Lymphs: 26 %
MCH: 29 pg (ref 26.6–33.0)
MCHC: 32.8 g/dL (ref 31.5–35.7)
MCV: 89 fL (ref 79–97)
Monocytes Absolute: 0.5 10*3/uL (ref 0.1–0.9)
Monocytes: 9 %
Neutrophils Absolute: 3.5 10*3/uL (ref 1.4–7.0)
Neutrophils: 61 %
Platelets: 259 10*3/uL (ref 150–450)
RBC: 4.55 x10E6/uL (ref 3.77–5.28)
RDW: 12.4 % (ref 11.7–15.4)
WBC: 5.7 10*3/uL (ref 3.4–10.8)

## 2020-09-11 LAB — SEDIMENTATION RATE: Sed Rate: 3 mm/hr (ref 0–32)

## 2020-09-11 LAB — COMPREHENSIVE METABOLIC PANEL
ALT: 29 IU/L (ref 0–32)
AST: 22 IU/L (ref 0–40)
Albumin/Globulin Ratio: 1.7 (ref 1.2–2.2)
Albumin: 4.4 g/dL (ref 3.8–4.8)
Alkaline Phosphatase: 53 IU/L (ref 44–121)
BUN/Creatinine Ratio: 24 — ABNORMAL HIGH (ref 9–23)
BUN: 18 mg/dL (ref 6–24)
Bilirubin Total: <0.2 mg/dL (ref 0.0–1.2)
CO2: 24 mmol/L (ref 20–29)
Calcium: 9.3 mg/dL (ref 8.7–10.2)
Chloride: 102 mmol/L (ref 96–106)
Creatinine, Ser: 0.74 mg/dL (ref 0.57–1.00)
GFR calc Af Amer: 112 mL/min/{1.73_m2} (ref >59)
GFR calc non Af Amer: 97 mL/min/{1.73_m2} (ref >59)
Globulin, Total: 2.6 g/dL (ref 1.5–4.5)
Glucose: 132 mg/dL — ABNORMAL HIGH (ref 65–99)
Potassium: 4.1 mmol/L (ref 3.5–5.2)
Sodium: 140 mmol/L (ref 134–144)
Total Protein: 7 g/dL (ref 6.0–8.5)

## 2020-09-11 LAB — PAN-ANCA
ANCA Proteinase 3: <3.5 U/mL (ref 0.0–3.5)
Atypical pANCA: <1:20 {titer}
C-ANCA: <1:20 {titer}
Myeloperoxidase Ab: <9 U/mL (ref 0.0–9.0)
P-ANCA: <1:20 {titer}

## 2020-09-11 LAB — HEPATITIS B SURFACE ANTIBODY,QUALITATIVE: Hep B Surface Ab, Qual: NONREACTIVE

## 2020-09-11 LAB — HIV ANTIBODY (ROUTINE TESTING W REFLEX): HIV Screen 4th Generation wRfx: NONREACTIVE

## 2020-09-11 LAB — VITAMIN B6: Vitamin B6: 15.6 ug/L (ref 2.0–32.8)

## 2020-09-11 LAB — MULTIPLE MYELOMA PANEL, SERUM
Albumin SerPl Elph-Mcnc: 4 g/dL (ref 2.9–4.4)
Albumin/Glob SerPl: 1.4 (ref 0.7–1.7)
Alpha 1: 0.2 g/dL (ref 0.0–0.4)
Alpha2 Glob SerPl Elph-Mcnc: 0.7 g/dL (ref 0.4–1.0)
B-Globulin SerPl Elph-Mcnc: 1.1 g/dL (ref 0.7–1.3)
Gamma Glob SerPl Elph-Mcnc: 1 g/dL (ref 0.4–1.8)
Globulin, Total: 3 g/dL (ref 2.2–3.9)
IgA/Immunoglobulin A, Serum: 69 mg/dL — ABNORMAL LOW (ref 87–352)
IgG (Immunoglobin G), Serum: 1124 mg/dL (ref 586–1602)
IgM (Immunoglobulin M), Srm: 70 mg/dL (ref 26–217)

## 2020-09-11 LAB — HEPATITIS B CORE ANTIBODY, TOTAL: Hep B Core Total Ab: NEGATIVE

## 2020-09-11 LAB — ENA+DNA/DS+ANTICH+CENTRO+FA...
Anti JO-1: <0.2 AI (ref 0.0–0.9)
Antiribosomal P Antibodies: <0.2 AI (ref 0.0–0.9)
Centromere Ab Screen: <0.2 AI (ref 0.0–0.9)
Chromatin Ab SerPl-aCnc: <0.2 AI (ref 0.0–0.9)
ENA RNP Ab: <0.2 AI (ref 0.0–0.9)
ENA SM Ab Ser-aCnc: <0.2 AI (ref 0.0–0.9)
Scleroderma (Scl-70) (ENA) Antibody, IgG: <0.2 AI (ref 0.0–0.9)
Smith/RNP Antibodies: <0.2 AI (ref 0.0–0.9)
Speckled Pattern: 1:160 {titer} — ABNORMAL HIGH
dsDNA Ab: 1 IU/mL (ref 0–9)

## 2020-09-11 LAB — CELIAC AB TTG DGP TIGA
Antigliadin Abs, IgA: 2 units (ref 0–19)
Gliadin IgG: 2 units (ref 0–19)
Tissue Transglut Ab: 3 U/mL (ref 0–5)
Transglutaminase IgA: <2 U/mL (ref 0–3)

## 2020-09-11 LAB — ANA W/REFLEX: ANA Titer 1: POSITIVE — AB

## 2020-09-11 LAB — ANTI-HU, ANTI-RI, ANTI-YO IFA
Anti-Hu Ab: NEGATIVE
Anti-Ri Ab: NEGATIVE
Anti-Yo Ab: NEGATIVE

## 2020-09-11 LAB — HEPATITIS B SURFACE ANTIGEN: Hepatitis B Surface Ag: NEGATIVE

## 2020-09-11 LAB — HEMOGLOBIN A1C
Est. average glucose Bld gHb Est-mCnc: 103 mg/dL
Hgb A1c MFr Bld: 5.2 % (ref 4.8–5.6)

## 2020-09-11 LAB — METHYLMALONIC ACID, SERUM: Methylmalonic Acid: 206 nmol/L (ref 0–378)

## 2020-09-11 LAB — RPR: RPR Ser Ql: NONREACTIVE

## 2020-09-11 LAB — C-REACTIVE PROTEIN: CRP: <1 mg/L (ref 0–10)

## 2020-09-11 LAB — VITAMIN B12: Vitamin B-12: 405 pg/mL (ref 232–1245)

## 2020-09-11 LAB — TSH: TSH: 2.46 u[IU]/mL (ref 0.450–4.500)

## 2020-09-11 LAB — HEPATITIS C ANTIBODY: Hep C Virus Ab: <0.1 s/co ratio (ref 0.0–0.9)

## 2020-09-11 LAB — SJOGREN'S SYNDROME ANTIBODS(SSA + SSB)
ENA SSA (RO) Ab: <0.2 AI (ref 0.0–0.9)
ENA SSB (LA) Ab: <0.2 AI (ref 0.0–0.9)

## 2020-09-11 LAB — VITAMIN B1: Thiamine: 122.9 nmol/L (ref 66.5–200.0)

## 2020-09-11 LAB — HOMOCYSTEINE: Homocysteine: 7 umol/L (ref 0.0–14.5)

## 2020-09-19 ENCOUNTER — Other Ambulatory Visit

## 2020-09-20 ENCOUNTER — Encounter: Payer: Self-pay | Admitting: *Deleted

## 2020-09-23 ENCOUNTER — Ambulatory Visit: Payer: 59 | Admitting: Neurology

## 2020-10-05 ENCOUNTER — Encounter: Payer: 59 | Admitting: Podiatry

## 2020-10-06 ENCOUNTER — Ambulatory Visit: Payer: 59 | Admitting: Neurology

## 2020-10-07 ENCOUNTER — Encounter: Payer: 59 | Admitting: Podiatry

## 2020-10-12 ENCOUNTER — Encounter: Payer: 59 | Admitting: Podiatry

## 2020-10-14 ENCOUNTER — Encounter: Payer: 59 | Admitting: Podiatry

## 2020-10-14 DIAGNOSIS — Z01419 Encounter for gynecological examination (general) (routine) without abnormal findings: Secondary | ICD-10-CM | POA: Diagnosis not present

## 2020-10-14 DIAGNOSIS — Z6830 Body mass index (BMI) 30.0-30.9, adult: Secondary | ICD-10-CM | POA: Diagnosis not present

## 2020-10-14 DIAGNOSIS — R319 Hematuria, unspecified: Secondary | ICD-10-CM | POA: Diagnosis not present

## 2020-10-20 DIAGNOSIS — M79601 Pain in right arm: Secondary | ICD-10-CM | POA: Diagnosis not present

## 2020-10-20 DIAGNOSIS — M545 Low back pain, unspecified: Secondary | ICD-10-CM | POA: Diagnosis not present

## 2020-10-21 ENCOUNTER — Other Ambulatory Visit: Payer: Self-pay | Admitting: Internal Medicine

## 2020-10-21 ENCOUNTER — Telehealth: Payer: Self-pay

## 2020-10-21 DIAGNOSIS — M5412 Radiculopathy, cervical region: Secondary | ICD-10-CM

## 2020-10-21 NOTE — Telephone Encounter (Signed)
Unfortunately patient's green vials ran out due to having to repeat because of time. Mixed down new green vials and will administer 0.05 at next injection. Patient has been informed and flowsheet reflects this change.

## 2020-10-21 NOTE — Telephone Encounter (Signed)
Patient called back regarding this same message. She said for medical reasons she had to stop injections and would like to know if she can start again.

## 2020-10-21 NOTE — Telephone Encounter (Signed)
If patient comes in between now and 3/4 - give Green 0.15 If patient comes in between 3/5 and 3/11 - give Green 0.10

## 2020-10-21 NOTE — Telephone Encounter (Signed)
Patient called, informed Anne Jefferson that she use to get injections and haven't been in a little while due to health issues and was wondering if she come in and receive them. Anne Jefferson asked the patient on hold while she looked up her information. Patient waited 1 min then hung up. I called patient back and left a message for her to call the office in regards to her allergy injections. Patient last injection was on 07/27/2020 and received 0.25 ml of her first red vials. Please advise on where to begin her at.

## 2020-10-22 ENCOUNTER — Ambulatory Visit
Admission: RE | Admit: 2020-10-22 | Discharge: 2020-10-22 | Disposition: A | Discharge status: Home / Self Care | Payer: 59 | Source: Ambulatory Visit | Attending: Internal Medicine | Admitting: Internal Medicine

## 2020-10-22 ENCOUNTER — Other Ambulatory Visit: Payer: Self-pay | Admitting: Internal Medicine

## 2020-10-22 ENCOUNTER — Other Ambulatory Visit

## 2020-10-22 ENCOUNTER — Other Ambulatory Visit: Payer: Self-pay

## 2020-10-22 DIAGNOSIS — M5412 Radiculopathy, cervical region: Secondary | ICD-10-CM

## 2020-10-22 DIAGNOSIS — M47814 Spondylosis without myelopathy or radiculopathy, thoracic region: Secondary | ICD-10-CM | POA: Diagnosis not present

## 2020-10-22 DIAGNOSIS — R200 Anesthesia of skin: Secondary | ICD-10-CM | POA: Diagnosis not present

## 2020-10-22 IMAGING — MR MR THORACIC SPINE W/O CM
4 of 6 series · 21 of 48 positions shown · non-contrast
Comparison: None.

CLINICAL DATA: Mid back pain radiating down right arm, right arm
numbness

EXAM:
MRI THORACIC SPINE WITHOUT CONTRAST
TECHNIQUE: Multiplanar, multisequence MR imaging of the thoracic spine was
performed. No intravenous contrast was administered.

[Series 5: T2 · sagittal · 4.0mm · 0.62mm/px · 5 of 21 slices shown (1 of 3)]
[im 1/21]
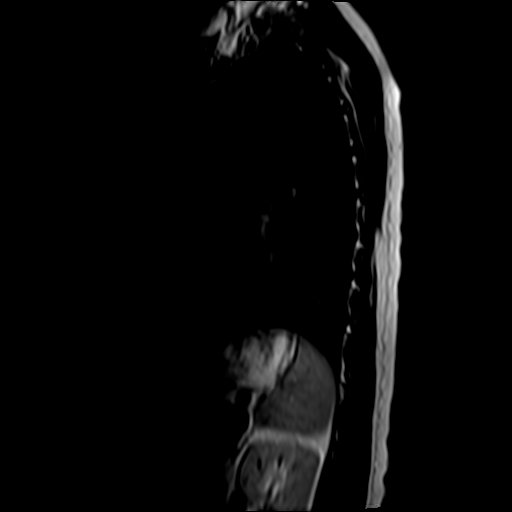
[im 6/21]
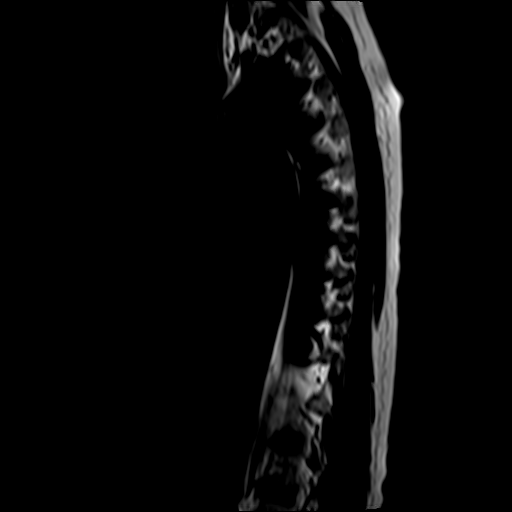
[im 11/21]
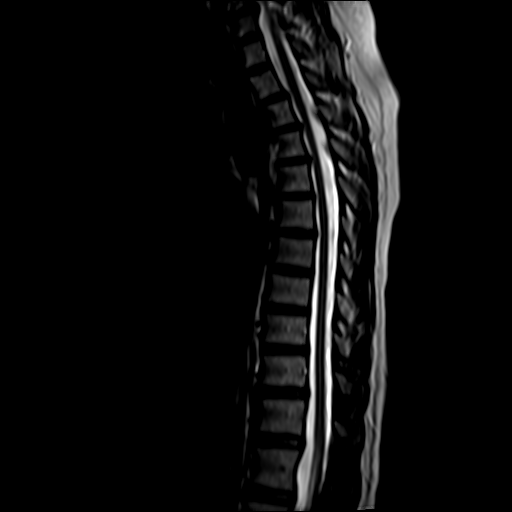
[im 16/21]
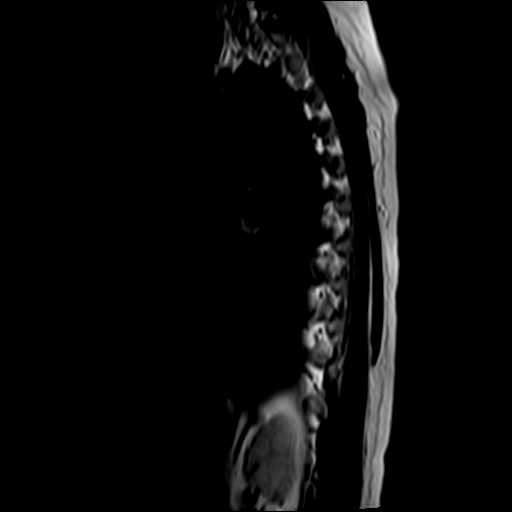
[im 21/21]
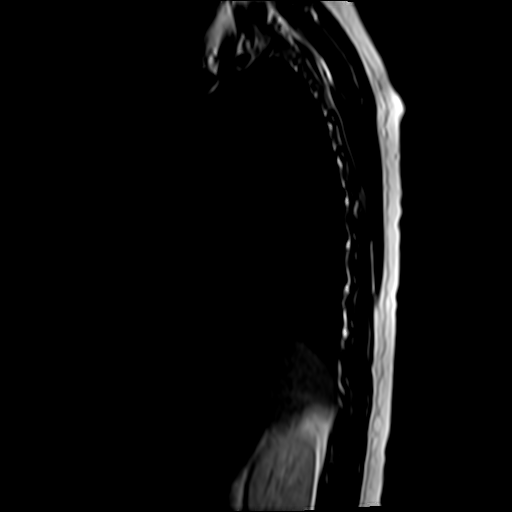

[Series 7: T1 · sagittal · 4.0mm · 1.25mm/px · 3 of 21 slices shown]
[im 1/21]
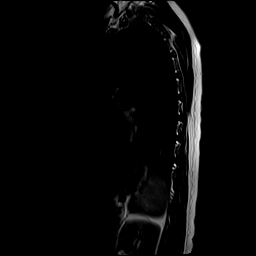
[im 11/21]
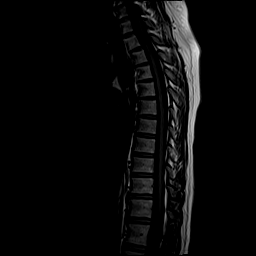
[im 21/21]
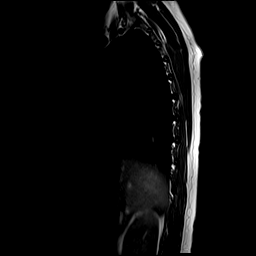

[Series 8: T2 · axial · 4.0mm · 0.39mm/px · z∈[-248,+53]mm · 9 of 54 slices shown (2 of 3)]
[im 1/54]
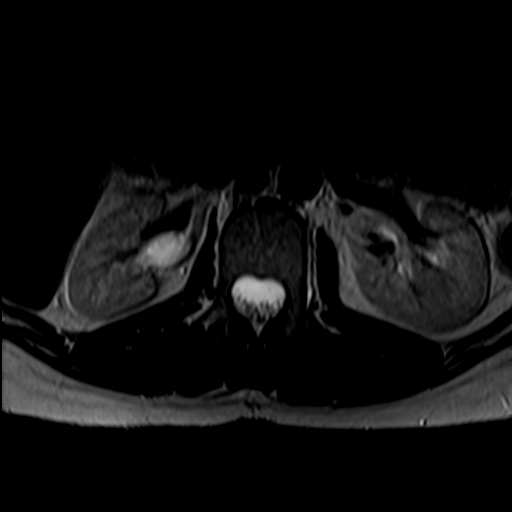
[im 9/54]
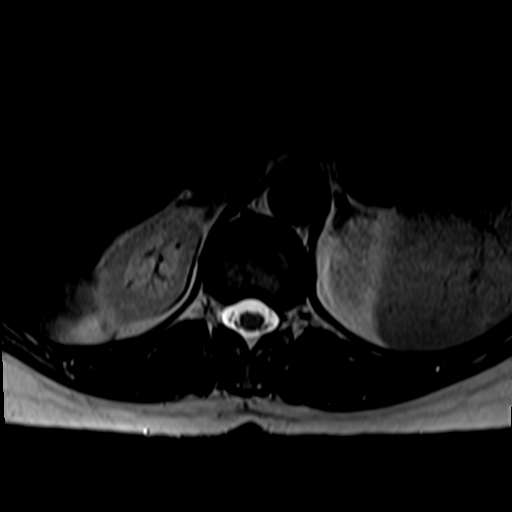
[im 18/54]
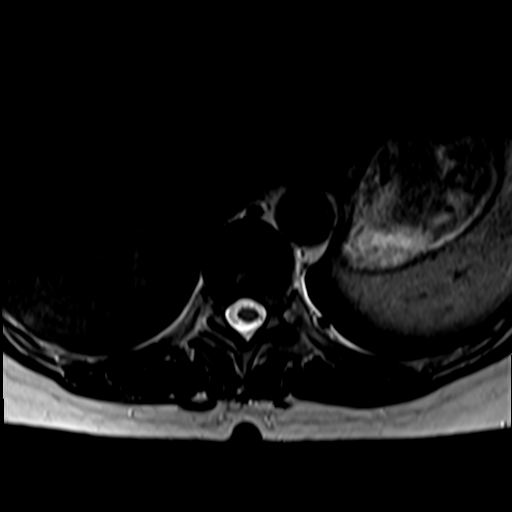
[im 23/54]
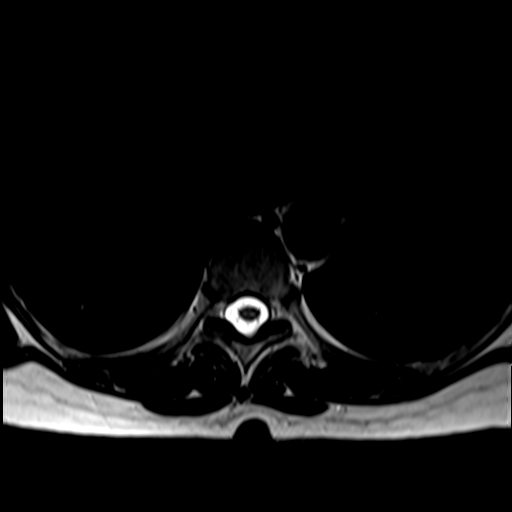
[im 27/54]
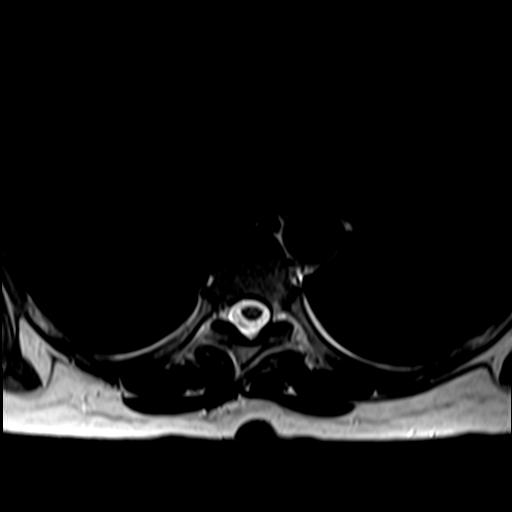
[im 31/54]
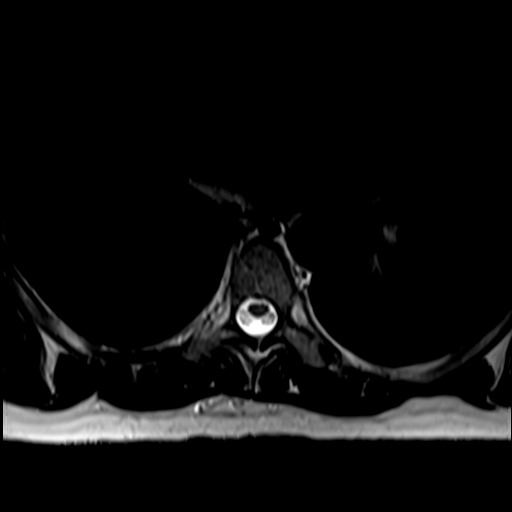
[im 36/54]
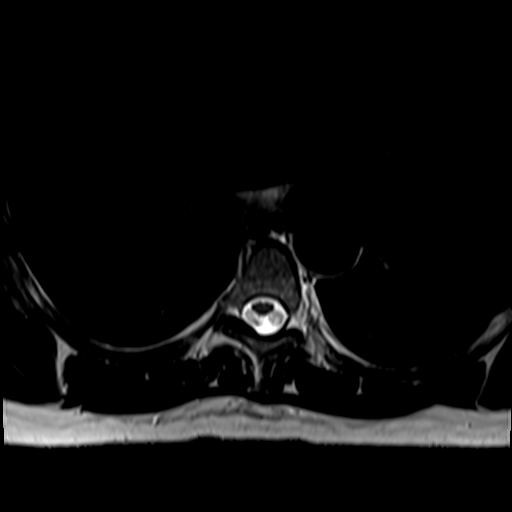
[im 45/54]
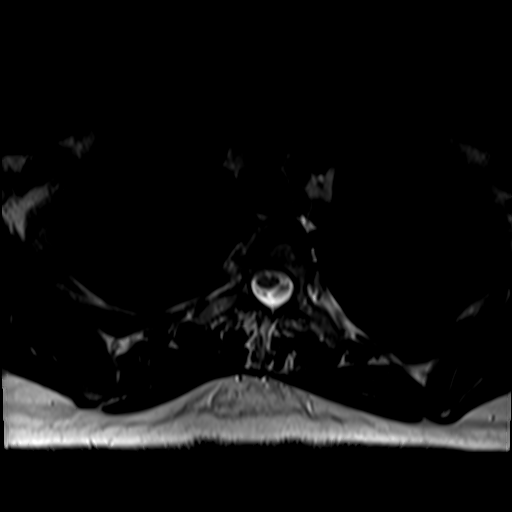
[im 54/54]
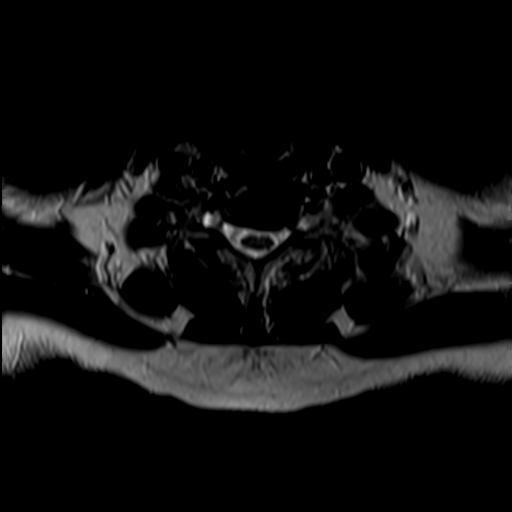

[Series 9: T2 · axial · 4.0mm · 0.39mm/px · z∈[-248,-1]mm · 4 of 54 slices shown (3 of 3)]
[im 1/54]
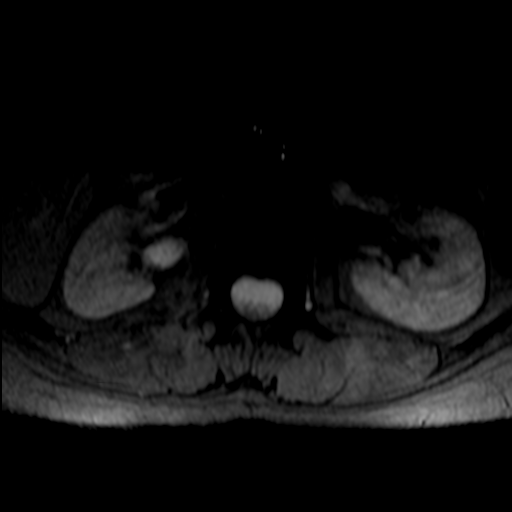
[im 9/54]
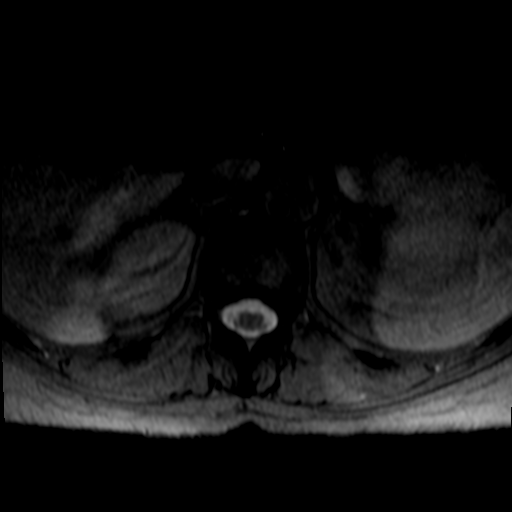
[im 27/54]
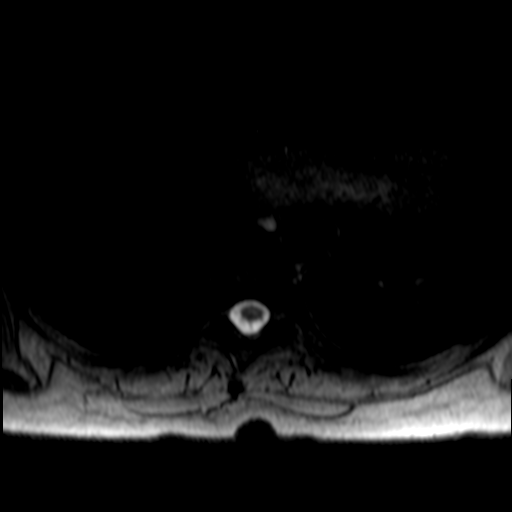
[im 45/54]
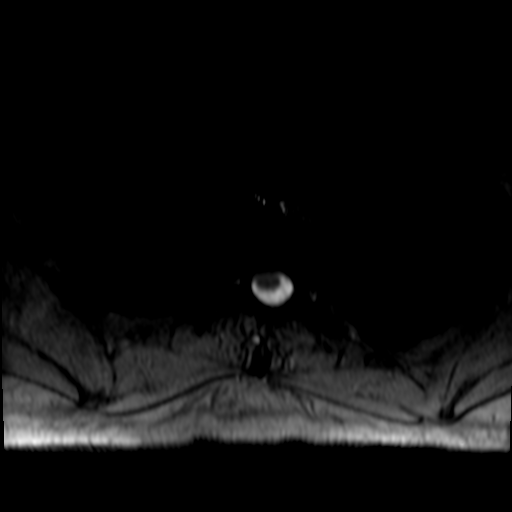

[21 of 48 positions shown; findings below may reference images not displayed]

FINDINGS: Alignment:  Preserved.

Vertebrae: Vertebral body heights are maintained. No marrow edema.
No suspicious osseous lesion.

Cord:  Normal signal.

Paraspinal and other soft tissues: Unremarkable.

Disc levels:

Intervertebral disc heights are maintained. Minor multilevel
degenerative disc disease with minimal disc bulges or protrusions,
for example from T2-T3 to T6-T7 and at T10-T11 and T11-T12. No canal
or foraminal stenosis at any level.
IMPRESSION: No abnormal cord signal. Minor degenerative changes without
stenosis.

## 2020-10-26 ENCOUNTER — Ambulatory Visit (INDEPENDENT_AMBULATORY_CARE_PROVIDER_SITE_OTHER): Payer: 59 | Admitting: Allergy

## 2020-10-26 ENCOUNTER — Encounter: Payer: Self-pay | Admitting: Allergy

## 2020-10-26 ENCOUNTER — Encounter: Payer: 59 | Admitting: Podiatry

## 2020-10-26 ENCOUNTER — Other Ambulatory Visit: Payer: Self-pay

## 2020-10-26 VITALS — BP 122/78 | HR 85 | Temp 97.7°F | Resp 16

## 2020-10-26 DIAGNOSIS — L2089 Other atopic dermatitis: Secondary | ICD-10-CM

## 2020-10-26 DIAGNOSIS — J3089 Other allergic rhinitis: Secondary | ICD-10-CM

## 2020-10-26 DIAGNOSIS — J4521 Mild intermittent asthma with (acute) exacerbation: Secondary | ICD-10-CM

## 2020-10-26 MED ORDER — BUDESONIDE-FORMOTEROL FUMARATE 80-4.5 MCG/ACT IN AERO: 2.0000 | INHALATION_SPRAY | Freq: Two times a day (BID) | RESPIRATORY_TRACT | 2 refills | Status: DC

## 2020-10-26 NOTE — Patient Instructions (Addendum)
Perennial and seasonal allergic rhinitis  Continue environmental control measures.  May use Flonase (fluticasone) OR Nasonex nasal spray 1 spray per nostril twice a day as needed for nasal congestion.   Nasal saline spray (i.e., Simply Saline) or nasal saline lavage (i.e., NeilMed) is recommended as needed and prior to medicated nasal sprays.  Take allegra 180mg  twice a day.  May take mucinex twice a day for the chest congestion - take with plenty of water.   Mild intermittent asthma . Use albuterol nebulizer twice a day for the next 1-2 weeks then use only as needed.  Prednisone 10mg  tablet pack: 2 tablets given in office today. Take 2 more tablets before bed today.  Then take 2 tablets twice a day for 2 more days. Then take 2 tablets once a day for 1 day. Then take 1 tablet once a day for 1 day.   . Daily controller medication(s): start Symbicort 5mcg 2 puffs twice a day with spacer and rinse mouth afterwards. . May use albuterol rescue inhaler 2 puffs every 4 to 6 hours as needed for shortness of breath, chest tightness, coughing, and wheezing. May use albuterol rescue inhaler 2 puffs 5 to 15 minutes prior to strenuous physical activities. Monitor frequency of use.  . Asthma control goals:  o Full participation in all desired activities (may need albuterol before activity) o Albuterol use two times or less a week on average (not counting use with activity) o Cough interfering with sleep two times or less a month o Oral steroids no more than once a year o No hospitalizations  Atopic dermatitis  Continue proper skin care.  Follow up in 4 weeks or sooner if needed. If asthma is doing better will re-start allergy injections.    Reducing Pollen Exposure . Pollen seasons: trees (spring), grass (summer) and ragweed/weeds (fall). Marland Kitchen Keep windows closed in your home and car to lower pollen exposure.  [name] in the bedroom and throughout the house if possible.   . Avoid going out in dry windy days - especially early morning. . Pollen counts are highest between 5 - 10 AM and on dry, hot and windy days.  . Save outside activities for late afternoon or after a heavy rain, when pollen levels are lower.  . Avoid mowing of grass if you have grass pollen allergy. Marland Kitchen Be aware that pollen can also be transported indoors on people and pets.  . Dry your clothes in an automatic dryer rather than hanging them outside where they might collect pollen.  . Rinse hair and eyes before bedtime. Pet Allergen Avoidance: . Contrary to popular opinion, there are no "hypoallergenic" breeds of dogs or cats. That is because people are not allergic to an animal's hair, but to an allergen found in the animal's saliva, dander (dead skin flakes) or urine. Pet allergy symptoms typically occur within minutes. For some people, symptoms can build up and become most severe 8 to 12 hours after contact with the animal. People with severe allergies can experience reactions in public places if dander has been transported on the pet owners' clothing. Marland Kitchen Keeping an animal outdoors is only a partial solution, since homes with pets in the yard still have higher concentrations of animal allergens. . Before getting a pet, ask your allergist to determine if you are allergic to animals. If your pet is already considered part of your family, try to minimize contact and keep the pet out of the bedroom and other rooms where  you spend a great deal of time. . As with dust mites, vacuum carpets often or replace carpet with a hardwood floor, tile or linoleum. . High-efficiency particulate air (HEPA) cleaners can reduce allergen levels over time. . While dander and saliva are the source of cat and dog allergens, urine is the source of allergens from rabbits, hamsters, mice and [place] pigs; so ask a non-allergic family member to clean the animal's cage. . If you have a pet allergy, talk to your allergist about the  potential for allergy immunotherapy (allergy shots). This strategy can often provide long-term relief. Control of House Dust Mite Allergen . Dust mite allergens are a common trigger of allergy and asthma symptoms. While they can be found throughout the house, these microscopic creatures thrive in warm, humid environments such as bedding, upholstered furniture and carpeting. . Because so much time is spent in the bedroom, it is essential to reduce mite levels there.  . Encase pillows, mattresses, and box springs in special allergen-proof fabric covers or airtight, zippered plastic covers.  . Bedding should be washed weekly in hot water (130 F) and dried in a hot dryer. Allergen-proof covers are available for comforters and pillows that can't be regularly washed.  Wendee Copp the allergy-proof covers every few months. Minimize clutter in the bedroom. Keep pets out of the bedroom.  Marland Kitchen Keep humidity less than 50% by using a dehumidifier or air conditioning. You can buy a humidity measuring device called a hygrometer to monitor this.  . If possible, replace carpets with hardwood, linoleum, or washable area rugs. If that's not possible, vacuum frequently with a vacuum that has a HEPA filter. . Remove all upholstered furniture and non-washable window drapes from the bedroom. . Remove all non-washable stuffed toys from the bedroom.  Wash stuffed toys weekly. Mold Control . Mold and fungi can grow on a variety of surfaces provided certain temperature and moisture conditions exist.  . Outdoor molds grow on plants, decaying vegetation and soil. The major outdoor mold, Alternaria and Cladosporium, are found in very high numbers during hot and dry conditions. Generally, a late summer - fall peak is seen for common outdoor fungal spores. Rain will temporarily lower outdoor mold spore count, but counts rise rapidly when the rainy period ends. . The most important indoor molds are Aspergillus and Penicillium. Dark, humid  and poorly ventilated basements are ideal sites for mold growth. The next most common sites of mold growth are the bathroom and the kitchen. Outdoor (Seasonal) Mold Control . Use air conditioning and keep windows closed. . Avoid exposure to decaying vegetation. Marland Kitchen Avoid leaf raking. . Avoid grain handling. . Consider wearing a face mask if working in moldy areas.  Indoor (Perennial) Mold Control  . Maintain humidity below 50%. . Get rid of mold growth on hard surfaces with water, detergent and, if necessary, 5% bleach (do not mix with other cleaners). Then dry the area completely. If mold covers an area more than 10 square feet, consider hiring an indoor environmental professional. . For clothing, washing with soap and water is best. If moldy items cannot be cleaned and dried, throw them away. . Remove sources e.g. contaminated carpets. . Repair and seal leaking roofs or pipes. Using dehumidifiers in damp basements may be helpful, but empty the water and clean units regularly to prevent mildew from forming. All rooms, especially basements, bathrooms and kitchens, require ventilation and cleaning to deter mold and mildew growth. Avoid carpeting on concrete or damp floors, and  storing items in damp areas.  Skin care recommendations  Bath time: . Always use lukewarm water. AVOID very hot or cold water. Marland Kitchen Keep bathing time to 5-10 minutes. . Do NOT use bubble bath. . Use a mild soap and use just enough to wash the dirty areas. . Do NOT scrub skin vigorously.  . After bathing, pat dry your skin with a towel. Do NOT rub or scrub the skin.  Moisturizers and prescriptions:  . ALWAYS apply moisturizers immediately after bathing (within 3 minutes). This helps to lock-in moisture. . Use the moisturizer several times a day over the whole body. Kermit Balo summer moisturizers include: Aveeno, CeraVe, Cetaphil. Kermit Balo winter moisturizers include: Aquaphor, Vaseline, Cerave, Cetaphil, Eucerin,  Vanicream. . When using moisturizers along with medications, the moisturizer should be applied about one hour after applying the medication to prevent diluting effect of the medication or moisturize around where you applied the medications. When not using medications, the moisturizer can be continued twice daily as maintenance.  Laundry and clothing: . Avoid laundry products with added color or perfumes. . Use unscented hypo-allergenic laundry products such as Tide free, Cheer free & gentle, and All free and clear.  . If the skin still seems dry or sensitive, you can try double-rinsing the clothes. . Avoid tight or scratchy clothing such as wool. . Do not use fabric softeners or dyer sheets.

## 2020-10-26 NOTE — Progress Notes (Signed)
Follow Up Note  RE: Anne Jefferson MRN: [id number] DOB: [birth year] Date of Office Visit: 10/26/2020  Referring provider: Josetta Huddle, MD Primary care provider: Josetta Huddle, MD  Chief Complaint: Allergic Reaction (To cat, possibly a sinus infection currently)  History of Present Illness: I had the pleasure of seeing Anne [facility] for a follow up visit at the [facility] [place] [facility] [place] on 10/27/2020. She is a 45 y.o. female, who is being followed for allergic rhinitis on AIT, asthma, atopic dermatitis. Her previous allergy office visit was on 07/29/2020 with Dr. Maudie Mercury via telemedicine. Today is a new complaint visit of sinuses.  Shooting pain Stopped carbinoxamine and allergy injections which did not improve the symptoms. She is being evaluated by neurology, thoracic surgery and looking for a rheumatologist as well.   Perennial and seasonal allergic rhinitis The last 2 weeks noticed facial pressure, coughing, wheezing, some drainage.  Temperature was around 99.   Patient has been taking Tylenol prn and allegra 180mg  once a day, Nasonex 1 sprays per nostril BID. No nosebleeds. No recent oral prednisone or antibiotics.   Patient would like to go back on allergy injections.   Mild intermittent asthma  Some coughing and wheezing and using albuterol 8-9 times per day with good benefit recently.  Patient did not start Flovent as she could not find it.   Atopic dermatitis Skin is stable.   Assessment and Plan: Remmi is a 45 y.o. female with: Mild intermittent asthma with acute exacerbation Exacerbation with current URI/allergy flare. Using albuterol 8-9 times per day but did not start Flovent as she could not find it. No prednisone since the last visit.  Today's spirometry showed some restriction. . Use albuterol nebulizer twice a day for the next 1-2 weeks then use only as needed.  Start prednisone taper.  . Daily controller medication(s): start Symbicort  109mcg 2 puffs twice a day with spacer and rinse mouth afterwards. . May use albuterol rescue inhaler 2 puffs every 4 to 6 hours as needed for shortness of breath, chest tightness, coughing, and wheezing. May use albuterol rescue inhaler 2 puffs 5 to 15 minutes prior to strenuous physical activities. Monitor frequency of use.  . Repeat spirometry at next visit.   Perennial and seasonal allergic rhinitis Past history - started AIT on 04/22/2019 (M + G/DM/C/D). Interim history - Stopped AIT due to concerns if it was contributing to her shooting pain. Shooting pain is unchanged and allergies had flare. Wants to restart injections.  Continue environmental control measures.  May use Flonase (fluticasone) OR Nasonex nasal spray 1 spray per nostril twice a day as needed for nasal congestion.   Nasal saline spray (i.e., Simply Saline) or nasal saline lavage (i.e., NeilMed) is recommended as needed and prior to medicated nasal sprays.  Take allegra 180mg  twice a day.  May take mucinex twice a day for the chest congestion - take with plenty of water.   Restart injections once asthma is in better control.  Atopic dermatitis  Continue proper skin care.  Return in about 4 weeks (around 11/23/2020).  Meds ordered this encounter  Medications  . budesonide-formoterol (SYMBICORT) 80-4.5 MCG/ACT inhaler    Sig: Inhale 2 puffs into the lungs in the morning and at bedtime. with spacer and rinse mouth afterwards.    Dispense:  1 each    Refill:  2   Lab Orders  No laboratory test(s) ordered today    Diagnostics: Spirometry:  Tracings reviewed. Her effort: Good reproducible efforts.  FVC: 2.37L FEV1: 2.19L, 79% predicted FEV1/FVC ratio: 92% Interpretation: Spirometry consistent with possible restrictive disease.  Please see scanned spirometry results for details.  Medication List:  Current Outpatient Medications  Medication Sig Dispense Refill  . albuterol (PROVENTIL HFA) 108 (90 Base) MCG/ACT  inhaler Inhale 2 puffs into the lungs every 4 (four) hours as needed for wheezing or shortness of breath. 1 each 2  . budesonide-formoterol (SYMBICORT) 80-4.5 MCG/ACT inhaler Inhale 2 puffs into the lungs in the morning and at bedtime. with spacer and rinse mouth afterwards. 1 each 2  . carboxymethylcellulose (REFRESH PLUS) 0.5 % SOLN Place 1 drop into both eyes 4 (four) times daily as needed (dry eyes).    . Cholecalciferol (VITAMIN D3) 125 MCG (5000 UT) TBDP Take 1 tablet by mouth daily.    Marland Kitchen EPINEPHrine (AUVI-Q) 0.3 mg/0.3 mL IJ SOAJ injection Inject 0.3 mg into the muscle as needed for anaphylaxis. 2 each 1  . mometasone (NASONEX) 50 MCG/ACT nasal spray Place 1 spray into the nose daily. 1 each 3   No current facility-administered medications for this visit.   Allergies: Allergies  Allergen Reactions  . Promethazine Other (See Comments)    Hallucinations  . Theophyllines Nausea And Vomiting  . Mite (D. Farinae) Other (See Comments)  . Other Other (See Comments)   I reviewed her past medical history, social history, family history, and environmental history and no significant changes have been reported from her previous visit.  Review of Systems  Constitutional: Negative for appetite change, chills, fever and unexpected weight change.  HENT: Negative for congestion and rhinorrhea.   Eyes: Negative for itching.  Respiratory: Negative for cough, chest tightness, shortness of breath and wheezing.   Gastrointestinal: Negative for abdominal pain.  Skin: Negative for rash.  Allergic/Immunologic: Positive for environmental allergies.  Neurological: Negative for headaches.   Objective: BP 122/78   Pulse 85   Temp 97.7 F (36.5 C) (Temporal)   Resp 16   SpO2 97%  There is no height or weight on file to calculate BMI. Physical Exam Vitals and nursing note reviewed.  Constitutional:      Appearance: Normal appearance. She is well-developed.  HENT:     Head: Normocephalic and  atraumatic.     Right Ear: Tympanic membrane and external ear normal.     Left Ear: Tympanic membrane and external ear normal.     Nose: Nose normal.     Mouth/Throat:     Mouth: Mucous membranes are moist.     Pharynx: Oropharynx is clear.  Eyes:     Conjunctiva/sclera: Conjunctivae normal.  Cardiovascular:     Rate and Rhythm: Normal rate and regular rhythm.     Heart sounds: Normal heart sounds. No murmur heard.   Pulmonary:     Effort: Pulmonary effort is normal.     Breath sounds: Normal breath sounds. No wheezing, rhonchi or rales.  Musculoskeletal:     Cervical back: Neck supple.  Skin:    General: Skin is warm.     Findings: No rash.  Neurological:     Mental Status: She is alert and oriented to person, place, and time.    Previous notes and tests were reviewed. The plan was reviewed with the patient/family, and all questions/concerned were addressed.  It was my pleasure to see Sharai today and participate in her care. Please feel free to contact me with any questions or concerns.  Sincerely,  Rexene Alberts, DO Allergy & Immunology  [facility] [place] office: [place] office: [phone]

## 2020-10-27 ENCOUNTER — Encounter: Payer: Self-pay | Admitting: Allergy

## 2020-10-27 DIAGNOSIS — J4521 Mild intermittent asthma with (acute) exacerbation: Secondary | ICD-10-CM | POA: Insufficient documentation

## 2020-10-27 NOTE — Assessment & Plan Note (Signed)
Exacerbation with current URI/allergy flare. Using albuterol 8-9 times per day but did not start Flovent as she could not find it. No prednisone since the last visit.  Today's spirometry showed some restriction. . Use albuterol nebulizer twice a day for the next 1-2 weeks then use only as needed.  Start prednisone taper.  . Daily controller medication(s): start Symbicort 74mcg 2 puffs twice a day with spacer and rinse mouth afterwards. . May use albuterol rescue inhaler 2 puffs every 4 to 6 hours as needed for shortness of breath, chest tightness, coughing, and wheezing. May use albuterol rescue inhaler 2 puffs 5 to 15 minutes prior to strenuous physical activities. Monitor frequency of use.  . Repeat spirometry at next visit.

## 2020-10-27 NOTE — Assessment & Plan Note (Signed)
   Continue proper skin care. 

## 2020-10-27 NOTE — Assessment & Plan Note (Signed)
Past history - started AIT on 04/22/2019 (M + G/DM/C/D). Interim history - Stopped AIT due to concerns if it was contributing to her shooting pain. Shooting pain is unchanged and allergies had flare. Wants to restart injections.  Continue environmental control measures.  May use Flonase (fluticasone) OR Nasonex nasal spray 1 spray per nostril twice a day as needed for nasal congestion.   Nasal saline spray (i.e., Simply Saline) or nasal saline lavage (i.e., NeilMed) is recommended as needed and prior to medicated nasal sprays.  Take allegra 180mg  twice a day.  May take mucinex twice a day for the chest congestion - take with plenty of water.   Restart injections once asthma is in better control.

## 2020-10-28 ENCOUNTER — Encounter: Payer: 59 | Admitting: Podiatry

## 2020-10-29 ENCOUNTER — Encounter: Payer: Self-pay | Admitting: Allergy

## 2020-11-16 ENCOUNTER — Encounter: Payer: Self-pay | Admitting: Allergy

## 2020-11-16 ENCOUNTER — Ambulatory Visit (HOSPITAL_BASED_OUTPATIENT_CLINIC_OR_DEPARTMENT_OTHER)
Admission: RE | Admit: 2020-11-16 | Discharge: 2020-11-16 | Disposition: A | Discharge status: Home / Self Care | Payer: 59 | Source: Ambulatory Visit | Attending: Allergy | Admitting: Allergy

## 2020-11-16 ENCOUNTER — Other Ambulatory Visit: Payer: Self-pay

## 2020-11-16 ENCOUNTER — Ambulatory Visit (INDEPENDENT_AMBULATORY_CARE_PROVIDER_SITE_OTHER): Payer: 59 | Admitting: Allergy

## 2020-11-16 VITALS — BP 118/72 | HR 81 | Temp 97.7°F | Resp 16

## 2020-11-16 DIAGNOSIS — J45901 Unspecified asthma with (acute) exacerbation: Secondary | ICD-10-CM | POA: Diagnosis not present

## 2020-11-16 DIAGNOSIS — L2089 Other atopic dermatitis: Secondary | ICD-10-CM

## 2020-11-16 DIAGNOSIS — R059 Cough, unspecified: Secondary | ICD-10-CM

## 2020-11-16 DIAGNOSIS — J4541 Moderate persistent asthma with (acute) exacerbation: Secondary | ICD-10-CM

## 2020-11-16 DIAGNOSIS — J3089 Other allergic rhinitis: Secondary | ICD-10-CM | POA: Diagnosis not present

## 2020-11-16 DIAGNOSIS — J45909 Unspecified asthma, uncomplicated: Secondary | ICD-10-CM | POA: Diagnosis not present

## 2020-11-16 HISTORY — DX: Moderate persistent asthma with (acute) exacerbation: J45.41

## 2020-11-16 IMAGING — DX DG CHEST 2V
2 series · 2 of 2 positions shown · non-contrast
Comparison: [DATE]

CLINICAL DATA: cough

Asthma.
EXAM:
CHEST - 2 VIEW

[chest pa]
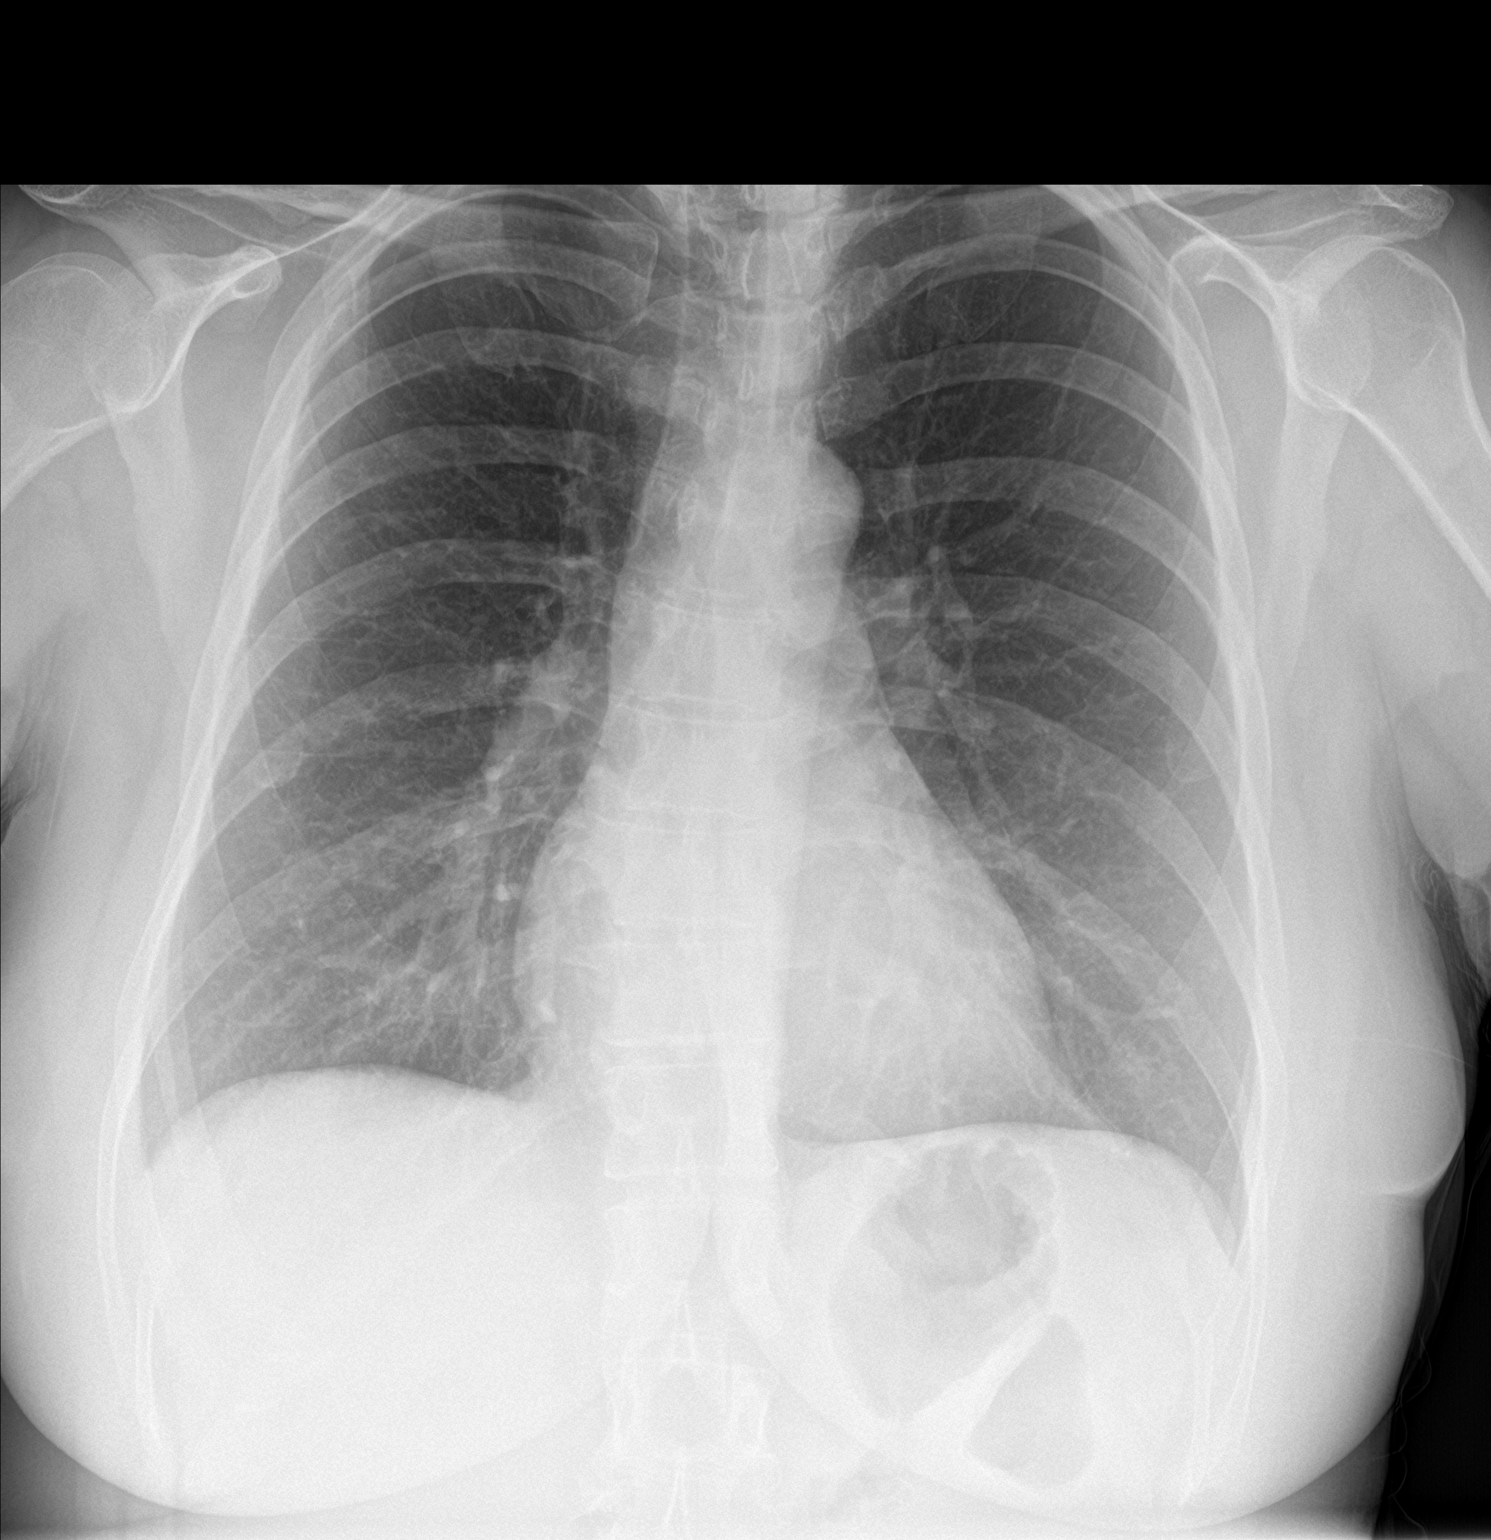

[chest lat]
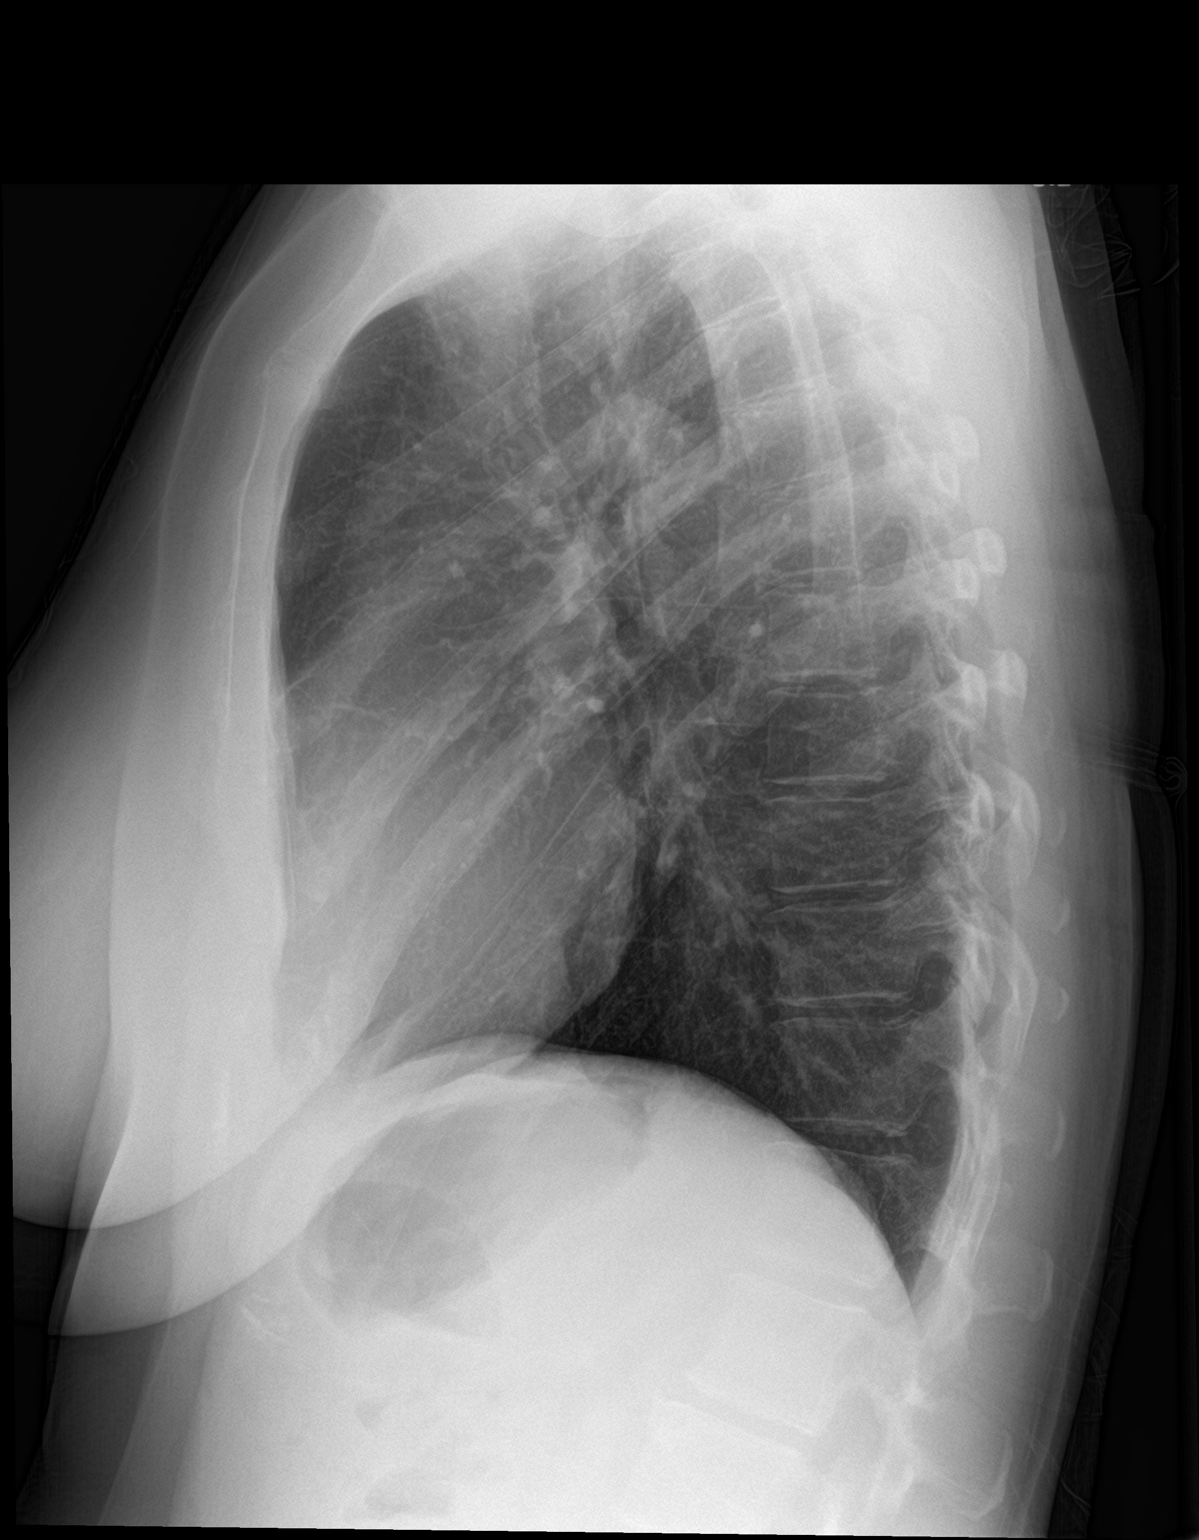

[2 of 2 positions shown; findings below may reference images not displayed]

FINDINGS: The cardiomediastinal contours are normal. The lungs are clear. No
significant bronchial thickening. Pulmonary vasculature is normal.
No consolidation, pleural effusion, or pneumothorax. No acute
osseous abnormalities are seen.
IMPRESSION: No acute findings or explanation for cough. No significant bronchial
thickening.

## 2020-11-16 MED ORDER — METHYLPREDNISOLONE ACETATE 80 MG/ML IJ SUSP
80.0000 mg | Freq: Once | INTRAMUSCULAR | Status: AC
  Administered 2020-11-16: 80 mg via INTRAMUSCULAR

## 2020-11-16 NOTE — Assessment & Plan Note (Signed)
Still not feeling better after the oral prednisone. Negative Covid-19 testing at home. No fevers/chills. Denies cardiac history.  Today's spirometry showed some restriction with 19% improvement in FEV1 post bronchodilator treatment. Clinically feeling improved.  Get Chest X-ray Depo 80mg  injection given today.  START Breztri 2 puffs twice a day with spacer and rinse mouth afterwards for the next 1 month. Samples given - this should last 1 month. Use albuterol nebulizer twice a day before using Breztri for the next 1 week.  . Daily controller medication(s): after finished with Breztri then start Symbicort 38mcg 2 puffs twice a day with spacer and rinse mouth afterwards. . May use albuterol rescue inhaler 2 puffs every 4 to 6 hours as needed for shortness of breath, chest tightness, coughing, and wheezing. May use albuterol rescue inhaler 2 puffs 5 to 15 minutes prior to strenuous physical activities. Monitor frequency of use.  . Repeat spirometry at next visit.   If you are having chest pressure and chest pain please go to ER/Urgent care or PCP to rule out cardiac issues.  Take omeprazole 20mg  once a day in the mornings for the next 2 weeks.

## 2020-11-16 NOTE — Progress Notes (Signed)
Follow Up Note  RE: Anne Jefferson MRN: [id number] DOB: 06/18/1976 Date of Office Visit: 11/16/2020  Referring provider: Josetta Huddle, MD Primary care provider: Josetta Huddle, MD  Chief Complaint: Asthma (SOB, non productive cough x 3 weeks, headache)  History of Present Illness: I had the pleasure of seeing Anne Jefferson for a follow up visit at the [facility] on 11/16/2020. She is a 45 y.o. female, who is being followed for asthma and allergic rhinitis. Her previous allergy office visit was on 10/26/2020 with Dr. Maudie Mercury. Today is a new complaint visit of asthma flare.  Asthma Finished prednisone taper with no real benefit. Negative Covid-19 testing at home.  Currently on Symbicort 44mcg 2 puffs BID and albuterol nebulizer 1-2 times per day with some benefit.  Currently having dry coughing, shortness of breath, pressure in lungs. No phlegm. No fevers/chills. No sick contacts. Denies any cardiac history.  Perennial and seasonal allergic rhinitis Some PND and slight yellowish discharge. Taking allegra BID, nasal spray twice a day.  Not taking Mucinex.  Assessment and Plan: Chase is a 45 y.o. female with: Not well controlled asthma with acute exacerbation Still not feeling better after the oral prednisone. Negative Covid-19 testing at home. No fevers/chills. Denies cardiac history.  Today's spirometry showed some restriction with 19% improvement in FEV1 post bronchodilator treatment. Clinically feeling improved.  Get Chest X-ray Depo 80mg  injection given today.  START Breztri 2 puffs twice a day with spacer and rinse mouth afterwards for the next 1 month. Samples given - this should last 1 month. Use albuterol nebulizer twice a day before using Breztri for the next 1 week.  . Daily controller medication(s): after finished with Breztri then start Symbicort 54mcg 2 puffs twice a day with spacer and rinse mouth afterwards. . May use albuterol  rescue inhaler 2 puffs every 4 to 6 hours as needed for shortness of breath, chest tightness, coughing, and wheezing. May use albuterol rescue inhaler 2 puffs 5 to 15 minutes prior to strenuous physical activities. Monitor frequency of use.  . Repeat spirometry at next visit.   If you are having chest pressure and chest pain please go to ER/Urgent care or PCP to rule out cardiac issues.  Take omeprazole 20mg  once a day in the mornings for the next 2 weeks.  Perennial and seasonal allergic rhinitis Past history - started AIT on 04/22/2019 (M + G/DM/C/D). Stopped AIT due to concerns if it was contributing to her shooting pain - no change. Interim history -  PND.  Continue environmental control measures.  May use Flonase (fluticasone) OR Nasonex nasal spray 1 spray per nostril twice a day as needed for nasal congestion.   Nasal saline spray (i.e., Simply Saline) or nasal saline lavage (i.e., NeilMed) is recommended as needed and prior to medicated nasal sprays.  Take allegra 180mg  twice a day.  Restart injections once asthma is in better control.  Atopic dermatitis  Continue proper skin care.  Return in about 4 weeks (around 12/14/2020).  Meds ordered this encounter  Medications  . methylPREDNISolone acetate (DEPO-MEDROL) injection 80 mg   Lab Orders  No laboratory test(s) ordered today    Diagnostics: Spirometry:  Tracings reviewed. Her effort: Good reproducible efforts. FVC: 2.16L FEV1: 1.99L, 73% predicted FEV1/FVC ratio: 92% Interpretation: Spirometry consistent with possible restrictive disease with 19% improvement in FEV1 post bronchodilator treatment. Clinically feeling improved.  .  Please see scanned spirometry results for details.  Medication List:  Current Outpatient Medications  Medication Sig Dispense Refill  . albuterol (PROVENTIL HFA) 108 (90 Base) MCG/ACT inhaler Inhale 2 puffs into the lungs every 4 (four) hours as needed for wheezing or shortness of  breath. 1 each 2  . budesonide-formoterol (SYMBICORT) 80-4.5 MCG/ACT inhaler Inhale 2 puffs into the lungs in the morning and at bedtime. with spacer and rinse mouth afterwards. 1 each 2  . carboxymethylcellulose (REFRESH PLUS) 0.5 % SOLN Place 1 drop into both eyes 4 (four) times daily as needed (dry eyes).    . Cholecalciferol (VITAMIN D3) 125 MCG (5000 UT) TBDP Take 1 tablet by mouth daily.    Marland Kitchen EPINEPHrine (AUVI-Q) 0.3 mg/0.3 mL IJ SOAJ injection Inject 0.3 mg into the muscle as needed for anaphylaxis. 2 each 1  . mometasone (NASONEX) 50 MCG/ACT nasal spray Place 1 spray into the nose daily. 1 each 3   No current facility-administered medications for this visit.   Allergies: Allergies  Allergen Reactions  . Promethazine Other (See Comments)    Hallucinations  . Theophyllines Nausea And Vomiting  . Mite (D. Farinae) Other (See Comments)  . Other Other (See Comments)   I reviewed her past medical history, social history, family history, and environmental history and no significant changes have been reported from her previous visit.  Review of Systems  Constitutional: Negative for appetite change, chills, fever and unexpected weight change.  HENT: Negative for congestion and rhinorrhea.   Eyes: Negative for itching.  Respiratory: Positive for cough, chest tightness, shortness of breath and wheezing.   Gastrointestinal: Negative for abdominal pain.  Skin: Negative for rash.  Allergic/Immunologic: Positive for environmental allergies.  Neurological: Negative for headaches.   Objective: BP 118/72   Pulse 81   Temp 97.7 F (36.5 C) (Temporal)   Resp 16   SpO2 95%  There is no height or weight on file to calculate BMI. Physical Exam Vitals and nursing note reviewed.  Constitutional:      Appearance: Normal appearance. She is well-developed.  HENT:     Head: Normocephalic and atraumatic.     Right Ear: Tympanic membrane and external ear normal.     Left Ear: Tympanic membrane  and external ear normal.     Nose: Nose normal.     Mouth/Throat:     Mouth: Mucous membranes are moist.     Pharynx: Oropharynx is clear.  Eyes:     Conjunctiva/sclera: Conjunctivae normal.  Cardiovascular:     Rate and Rhythm: Normal rate and regular rhythm.     Heart sounds: Normal heart sounds. No murmur heard.   Pulmonary:     Effort: Pulmonary effort is normal.     Breath sounds: Normal breath sounds. No wheezing, rhonchi or rales.  Musculoskeletal:     Cervical back: Neck supple.  Skin:    General: Skin is warm.     Findings: No rash.  Neurological:     Mental Status: She is alert and oriented to person, place, and time.    Previous notes and tests were reviewed. The plan was reviewed with the patient/family, and all questions/concerned were addressed.  It was my pleasure to see Anne Jefferson today and participate in her care. Please feel free to contact me with any questions or concerns.  Sincerely,  Rexene Alberts, DO Allergy & Immunology  [facility] office: [place] office: [phone]

## 2020-11-16 NOTE — Assessment & Plan Note (Signed)
Past history - started AIT on 04/22/2019 (M + G/DM/C/D). Stopped AIT due to concerns if it was contributing to her shooting pain - no change. Interim history -  PND.  Continue environmental control measures.  May use Flonase (fluticasone) OR Nasonex nasal spray 1 spray per nostril twice a day as needed for nasal congestion.   Nasal saline spray (i.e., Simply Saline) or nasal saline lavage (i.e., NeilMed) is recommended as needed and prior to medicated nasal sprays.  Take allegra 180mg  twice a day.  Restart injections once asthma is in better control.

## 2020-11-16 NOTE — Assessment & Plan Note (Signed)
   Continue proper skin care. 

## 2020-11-16 NOTE — Patient Instructions (Addendum)
Asthma Get Chest X-ray Depo 80mg  injection given today.  START Breztri 2 puffs twice a day with spacer and rinse mouth afterwards for the next 1 month. Samples given - this should last 1 month. Use albuterol nebulizer twice a day before using Breztri for the next 1 week.   . Daily controller medication(s): after finished with Breztri then start Symbicort 26mcg 2 puffs twice a day with spacer and rinse mouth afterwards. . May use albuterol rescue inhaler 2 puffs every 4 to 6 hours as needed for shortness of breath, chest tightness, coughing, and wheezing. May use albuterol rescue inhaler 2 puffs 5 to 15 minutes prior to strenuous physical activities. Monitor frequency of use.  . Asthma control goals:  o Full participation in all desired activities (may need albuterol before activity) o Albuterol use two times or less a week on average (not counting use with activity) o Cough interfering with sleep two times or less a month o Oral steroids no more than once a year o No hospitalizations   If you are having chest pressure and chest pain please go to ER/Urgent care or PCP to rule out cardiac issues.  Take omeprazole 20mg  once a day in the mornings for the next 2 weeks  Perennial and seasonal allergic rhinitis  Continue environmental control measures.  May use Flonase (fluticasone) OR Nasonex nasal spray 1 spray per nostril twice a day as needed for nasal congestion.   Nasal saline spray (i.e., Simply Saline) or nasal saline lavage (i.e., NeilMed) is recommended as needed and prior to medicated nasal sprays.  Take allegra 180mg  twice a day.  Atopic dermatitis  Continue proper skin care.  Follow up in 4 weeks or sooner if needed.  Reducing Pollen Exposure . Pollen seasons: trees (spring), grass (summer) and ragweed/weeds (fall). Marland Kitchen Keep windows closed in your home and car to lower pollen exposure.  [name] in the bedroom and throughout the house if possible.   . Avoid going out in dry windy days - especially early morning. . Pollen counts are highest between 5 - 10 AM and on dry, hot and windy days.  . Save outside activities for late afternoon or after a heavy rain, when pollen levels are lower.  . Avoid mowing of grass if you have grass pollen allergy. Marland Kitchen Be aware that pollen can also be transported indoors on people and pets.  . Dry your clothes in an automatic dryer rather than hanging them outside where they might collect pollen.  . Rinse hair and eyes before bedtime. Pet Allergen Avoidance: . Contrary to popular opinion, there are no "hypoallergenic" breeds of dogs or cats. That is because people are not allergic to an animal's hair, but to an allergen found in the animal's saliva, dander (dead skin flakes) or urine. Pet allergy symptoms typically occur within minutes. For some people, symptoms can build up and become most severe 8 to 12 hours after contact with the animal. People with severe allergies can experience reactions in public places if dander has been transported on the pet owners' clothing. Marland Kitchen Keeping an animal outdoors is only a partial solution, since homes with pets in the yard still have higher concentrations of animal allergens. . Before getting a pet, ask your allergist to determine if you are allergic to animals. If your pet is already considered part of your family, try to minimize contact and keep the pet out of the bedroom and other rooms where you spend a great deal of  time. . As with dust mites, vacuum carpets often or replace carpet with a hardwood floor, tile or linoleum. . High-efficiency particulate air (HEPA) cleaners can reduce allergen levels over time. . While dander and saliva are the source of cat and dog allergens, urine is the source of allergens from rabbits, hamsters, mice and [place] pigs; so ask a non-allergic family member to clean the animal's cage. . If you have a pet allergy, talk to your allergist about the  potential for allergy immunotherapy (allergy shots). This strategy can often provide long-term relief. Control of House Dust Mite Allergen . Dust mite allergens are a common trigger of allergy and asthma symptoms. While they can be found throughout the house, these microscopic creatures thrive in warm, humid environments such as bedding, upholstered furniture and carpeting. . Because so much time is spent in the bedroom, it is essential to reduce mite levels there.  . Encase pillows, mattresses, and box springs in special allergen-proof fabric covers or airtight, zippered plastic covers.  . Bedding should be washed weekly in hot water (130 F) and dried in a hot dryer. Allergen-proof covers are available for comforters and pillows that can't be regularly washed.  Wendee Copp the allergy-proof covers every few months. Minimize clutter in the bedroom. Keep pets out of the bedroom.  Marland Kitchen Keep humidity less than 50% by using a dehumidifier or air conditioning. You can buy a humidity measuring device called a hygrometer to monitor this.  . If possible, replace carpets with hardwood, linoleum, or washable area rugs. If that's not possible, vacuum frequently with a vacuum that has a HEPA filter. . Remove all upholstered furniture and non-washable window drapes from the bedroom. . Remove all non-washable stuffed toys from the bedroom.  Wash stuffed toys weekly. Mold Control . Mold and fungi can grow on a variety of surfaces provided certain temperature and moisture conditions exist.  . Outdoor molds grow on plants, decaying vegetation and soil. The major outdoor mold, Alternaria and Cladosporium, are found in very high numbers during hot and dry conditions. Generally, a late summer - fall peak is seen for common outdoor fungal spores. Rain will temporarily lower outdoor mold spore count, but counts rise rapidly when the rainy period ends. . The most important indoor molds are Aspergillus and Penicillium. Dark, humid  and poorly ventilated basements are ideal sites for mold growth. The next most common sites of mold growth are the bathroom and the kitchen. Outdoor (Seasonal) Mold Control . Use air conditioning and keep windows closed. . Avoid exposure to decaying vegetation. Marland Kitchen Avoid leaf raking. . Avoid grain handling. . Consider wearing a face mask if working in moldy areas.  Indoor (Perennial) Mold Control  . Maintain humidity below 50%. . Get rid of mold growth on hard surfaces with water, detergent and, if necessary, 5% bleach (do not mix with other cleaners). Then dry the area completely. If mold covers an area more than 10 square feet, consider hiring an indoor environmental professional. . For clothing, washing with soap and water is best. If moldy items cannot be cleaned and dried, throw them away. . Remove sources e.g. contaminated carpets. . Repair and seal leaking roofs or pipes. Using dehumidifiers in damp basements may be helpful, but empty the water and clean units regularly to prevent mildew from forming. All rooms, especially basements, bathrooms and kitchens, require ventilation and cleaning to deter mold and mildew growth. Avoid carpeting on concrete or damp floors, and storing items in damp areas.  Skin care recommendations  Bath time: . Always use lukewarm water. AVOID very hot or cold water. Marland Kitchen Keep bathing time to 5-10 minutes. . Do NOT use bubble bath. . Use a mild soap and use just enough to wash the dirty areas. . Do NOT scrub skin vigorously.  . After bathing, pat dry your skin with a towel. Do NOT rub or scrub the skin.  Moisturizers and prescriptions:  . ALWAYS apply moisturizers immediately after bathing (within 3 minutes). This helps to lock-in moisture. . Use the moisturizer several times a day over the whole body. Kermit Balo summer moisturizers include: Aveeno, CeraVe, Cetaphil. Kermit Balo winter moisturizers include: Aquaphor, Vaseline, Cerave, Cetaphil, Eucerin,  Vanicream. . When using moisturizers along with medications, the moisturizer should be applied about one hour after applying the medication to prevent diluting effect of the medication or moisturize around where you applied the medications. When not using medications, the moisturizer can be continued twice daily as maintenance.  Laundry and clothing: . Avoid laundry products with added color or perfumes. . Use unscented hypo-allergenic laundry products such as Tide free, Cheer free & gentle, and All free and clear.  . If the skin still seems dry or sensitive, you can try double-rinsing the clothes. . Avoid tight or scratchy clothing such as wool. . Do not use fabric softeners or dyer sheets.

## 2020-11-25 ENCOUNTER — Ambulatory Visit: Payer: 59 | Admitting: Allergy

## 2020-12-22 NOTE — Progress Notes (Signed)
Follow Up Note  RE: Anne Jefferson MRN: [id number] DOB: 08/16/1975 Date of Office Visit: 12/23/2020  Referring provider: Josetta Huddle, MD Primary care provider: Josetta Huddle, MD  Chief Complaint: Asthma (Doing better) and Allergies (Doing better)  History of Present Illness: I had the pleasure of seeing Anne Jefferson for a follow up visit at the [facility] on 12/23/2020. She is a 45 y.o. female, who is being followed for asthma, allergic rhinitis and atopic dermatitis. Her previous allergy office visit was on 11/16/2020 with Dr. Maudie Mercury. Today is a regular follow up visit.  Asthma Act score 24. Patient went back to Symbicort with no worsening symptoms. No albuterol use.  Denies any ER/urgent care visits or prednisone use since the last visit. Did not take the reflux medication.   Perennial and seasonal allergic rhinitis Patient would like to restart allergy injections. Noted increased symptoms since off injections for 1 month and she had to rehome her cat temporarily.  Currently taking Flonase/Nasonex 1 spray per nostril twice a day. No nosebleeds. Takes allegra 180mg  once a day.   Atopic dermatitis Stable.  Assessment and Plan: Anne Jefferson is a 45 y.o. female with: Moderate persistent asthma without complication Past history - 1 course of prednisone in 2022. Normal CXR in 2022.  Interim history - doing much better.   ACT score 24.  Today's spirometry showed: some restriction.  . Daily controller medication(s): Symbicort 27mcg 2 puffs twice a day with spacer and rinse mouth afterwards. . During upper respiratory infections/asthma flares: start Breztri 2 puffs twice a day for 1-2 weeks until your breathing symptoms return to baseline. Samples given.  . May use albuterol rescue inhaler 2 puffs every 4 to 6 hours as needed for shortness of breath, chest tightness, coughing, and wheezing. May use albuterol rescue inhaler 2 puffs 5 to 15 minutes  prior to strenuous physical activities. Monitor frequency of use.   Get spirometry at next visit.  Perennial and seasonal allergic rhinitis Past history - started AIT on 04/22/2019 (M + G/DM/C/D). Stopped AIT due to concerns if it was contributing to her shooting pain - no change. Interim history -  Worsening symptoms since stopped injections and wants to restart.   Continue environmental control measures.  Restart allergy injections at our [place] office due to work hours.   Restart blue vial (schedule B), gold vial (schedule B), green vial (schedule A), red vial (schedule A).  Use over the counter antihistamines such as Zyrtec (cetirizine), Claritin (loratadine), Allegra (fexofenadine), or Xyzal (levocetirizine) daily as needed. May take twice a day during allergy flares. May switch antihistamines every few months.  May use Flonase (fluticasone) OR Nasonex nasal spray 1 spray per nostril twice a day as needed for nasal congestion.   Nasal saline spray (i.e., Simply Saline) or nasal saline lavage (i.e., NeilMed) is recommended as needed and prior to medicated nasal sprays.  Atopic dermatitis  Continue proper skin care.  Return in about 3 months (around 03/25/2021).  No orders of the defined types were placed in this encounter.  Lab Orders  No laboratory test(s) ordered today    Diagnostics: Spirometry:  Tracings reviewed. Her effort: Good reproducible efforts. FVC: 2.35L FEV1: 2.12L, 72% predicted FEV1/FVC ratio: 90% Interpretation: Spirometry consistent with possible restrictive disease.  Please see scanned spirometry results for details.   Medication List:  Current Outpatient Medications  Medication Sig Dispense Refill  . albuterol (PROVENTIL HFA) 108 (90 Base) MCG/ACT inhaler Inhale 2 puffs into the lungs  every 4 (four) hours as needed for wheezing or shortness of breath. 1 each 2  . budesonide-formoterol (SYMBICORT) 80-4.5 MCG/ACT inhaler Inhale 2 puffs into the  lungs in the morning and at bedtime. with spacer and rinse mouth afterwards. 1 each 2  . carboxymethylcellulose (REFRESH PLUS) 0.5 % SOLN Place 1 drop into both eyes 4 (four) times daily as needed (dry eyes).    . Cholecalciferol (VITAMIN D3) 125 MCG (5000 UT) TBDP Take 1 tablet by mouth daily.    Marland Kitchen EPINEPHrine (AUVI-Q) 0.3 mg/0.3 mL IJ SOAJ injection Inject 0.3 mg into the muscle as needed for anaphylaxis. 2 each 1  . mometasone (NASONEX) 50 MCG/ACT nasal spray Place 1 spray into the nose daily. 1 each 3   No current facility-administered medications for this visit.   Allergies: Allergies  Allergen Reactions  . Promethazine Other (See Comments)    Hallucinations  . Theophyllines Nausea And Vomiting  . Mite (D. Farinae) Other (See Comments)  . Other Other (See Comments)   I reviewed her past medical history, social history, family history, and environmental history and no significant changes have been reported from her previous visit.  Review of Systems  Constitutional: Negative for appetite change, chills, fever and unexpected weight change.  HENT: Negative for congestion and rhinorrhea.   Eyes: Negative for itching.  Respiratory: Negative for cough, chest tightness, shortness of breath and wheezing.   Gastrointestinal: Negative for abdominal pain.  Skin: Positive for rash.  Allergic/Immunologic: Positive for environmental allergies.  Neurological: Negative for headaches.   Objective: BP 116/78 (BP Location: Right Arm, Patient Position: Sitting, Cuff Size: Large)   Pulse 79   Temp 98.1 F (36.7 C) (Temporal)   Resp 18   Ht 5\' 5"  (1.651 m)   Wt 181 lb (82.1 kg)   SpO2 97%   BMI 30.12 kg/m  Body mass index is 30.12 kg/m. Physical Exam Vitals and nursing note reviewed.  Constitutional:      Appearance: Normal appearance. She is well-developed.  HENT:     Head: Normocephalic and atraumatic.     Right Ear: Tympanic membrane and external ear normal.     Left Ear: Tympanic  membrane and external ear normal.     Nose: Nose normal.     Mouth/Throat:     Mouth: Mucous membranes are moist.     Pharynx: Oropharynx is clear.  Eyes:     Conjunctiva/sclera: Conjunctivae normal.  Cardiovascular:     Rate and Rhythm: Normal rate and regular rhythm.     Heart sounds: Normal heart sounds. No murmur heard.   Pulmonary:     Effort: Pulmonary effort is normal.     Breath sounds: Normal breath sounds. No wheezing, rhonchi or rales.  Musculoskeletal:     Cervical back: Neck supple.  Skin:    General: Skin is warm.     Findings: No rash.  Neurological:     Mental Status: She is alert and oriented to person, place, and time.    Previous notes and tests were reviewed. The plan was reviewed with the patient/family, and all questions/concerned were addressed.  It was my pleasure to see Anne Jefferson today and participate in her care. Please feel free to contact me with any questions or concerns.  Sincerely,  Rexene Alberts, DO Allergy & Immunology  [facility] office: [place] office: [phone]

## 2020-12-23 ENCOUNTER — Ambulatory Visit (INDEPENDENT_AMBULATORY_CARE_PROVIDER_SITE_OTHER): Payer: 59 | Admitting: Allergy

## 2020-12-23 ENCOUNTER — Encounter: Payer: Self-pay | Admitting: Allergy

## 2020-12-23 ENCOUNTER — Other Ambulatory Visit: Payer: Self-pay

## 2020-12-23 ENCOUNTER — Telehealth: Payer: Self-pay

## 2020-12-23 VITALS — BP 116/78 | HR 79 | Temp 98.1°F | Resp 18 | Ht 65.0 in | Wt 181.0 lb

## 2020-12-23 DIAGNOSIS — J4540 Moderate persistent asthma, uncomplicated: Secondary | ICD-10-CM

## 2020-12-23 DIAGNOSIS — J3089 Other allergic rhinitis: Secondary | ICD-10-CM | POA: Diagnosis not present

## 2020-12-23 DIAGNOSIS — L2089 Other atopic dermatitis: Secondary | ICD-10-CM

## 2020-12-23 HISTORY — DX: Moderate persistent asthma, uncomplicated: J45.40

## 2020-12-23 NOTE — Patient Instructions (Addendum)
Asthma . Daily controller medication(s): Symbicort 75mcg 2 puffs twice a day with spacer and rinse mouth afterwards. . During upper respiratory infections/asthma flares: start Breztri 2 puffs twice a day for 1-2 weeks until your breathing symptoms return to baseline. Samples given.  . May use albuterol rescue inhaler 2 puffs every 4 to 6 hours as needed for shortness of breath, chest tightness, coughing, and wheezing. May use albuterol rescue inhaler 2 puffs 5 to 15 minutes prior to strenuous physical activities. Monitor frequency of use.  . Asthma control goals:  o Full participation in all desired activities (may need albuterol before activity) o Albuterol use two times or less a week on average (not counting use with activity) o Cough interfering with sleep two times or less a month o Oral steroids no more than once a year o No hospitalizations  Perennial and seasonal allergic rhinitis  Continue environmental control measures.  Restart allergy injections at our [facility] office - last shot given at 6:30PM on Tuesdays and Thursdays.   Use over the counter antihistamines such as Zyrtec (cetirizine), Claritin (loratadine), Allegra (fexofenadine), or Xyzal (levocetirizine) daily as needed. May take twice a day during allergy flares. May switch antihistamines every few months.   May use Flonase (fluticasone) OR Nasonex nasal spray 1 spray per nostril twice a day as needed for nasal congestion.   Nasal saline spray (i.e., Simply Saline) or nasal saline lavage (i.e., NeilMed) is recommended as needed and prior to medicated nasal sprays.  Atopic dermatitis  Continue proper skin care.  Follow up in 3 months or sooner if needed.   Reducing Pollen Exposure . Pollen seasons: trees (spring), grass (summer) and ragweed/weeds (fall). Marland Kitchen Keep windows closed in your home and car to lower pollen exposure.  Anne Jefferson air conditioning in the bedroom and throughout the house if possible.  . Avoid  going out in dry windy days - especially early morning. . Pollen counts are highest between 5 - 10 AM and on dry, hot and windy days.  . Save outside activities for late afternoon or after a heavy rain, when pollen levels are lower.  . Avoid mowing of grass if you have grass pollen allergy. Marland Kitchen Be aware that pollen can also be transported indoors on people and pets.  . Dry your clothes in an automatic dryer rather than hanging them outside where they might collect pollen.  . Rinse hair and eyes before bedtime. Pet Allergen Avoidance: . Contrary to popular opinion, there are no "hypoallergenic" breeds of dogs or cats. That is because people are not allergic to an animal's hair, but to an allergen found in the animal's saliva, dander (dead skin flakes) or urine. Pet allergy symptoms typically occur within minutes. For some people, symptoms can build up and become most severe 8 to 12 hours after contact with the animal. People with severe allergies can experience reactions in public places if dander has been transported on the pet owners' clothing. Marland Kitchen Keeping an animal outdoors is only a partial solution, since homes with pets in the yard still have higher concentrations of animal allergens. . Before getting a pet, ask your allergist to determine if you are allergic to animals. If your pet is already considered part of your family, try to minimize contact and keep the pet out of the bedroom and other rooms where you spend a great deal of time. . As with dust mites, vacuum carpets often or replace carpet with a hardwood floor, tile or linoleum. . High-efficiency  particulate air (HEPA) cleaners can reduce allergen levels over time. . While dander and saliva are the source of cat and dog allergens, urine is the source of allergens from rabbits, hamsters, mice and [place] pigs; so ask a non-allergic family member to clean the animal's cage. . If you have a pet allergy, talk to your allergist about the potential  for allergy immunotherapy (allergy shots). This strategy can often provide long-term relief. Control of House Dust Mite Allergen . Dust mite allergens are a common trigger of allergy and asthma symptoms. While they can be found throughout the house, these microscopic creatures thrive in warm, humid environments such as bedding, upholstered furniture and carpeting. . Because so much time is spent in the bedroom, it is essential to reduce mite levels there.  . Encase pillows, mattresses, and box springs in special allergen-proof fabric covers or airtight, zippered plastic covers.  . Bedding should be washed weekly in hot water (130 F) and dried in a hot dryer. Allergen-proof covers are available for comforters and pillows that can't be regularly washed.  Anne Jefferson the allergy-proof covers every few months. Minimize clutter in the bedroom. Keep pets out of the bedroom.  Marland Kitchen Keep humidity less than 50% by using a dehumidifier or air conditioning. You can buy a humidity measuring device called a hygrometer to monitor this.  . If possible, replace carpets with hardwood, linoleum, or washable area rugs. If that's not possible, vacuum frequently with a vacuum that has a HEPA filter. . Remove all upholstered furniture and non-washable window drapes from the bedroom. . Remove all non-washable stuffed toys from the bedroom.  Wash stuffed toys weekly. Mold Control . Mold and fungi can grow on a variety of surfaces provided certain temperature and moisture conditions exist.  . Outdoor molds grow on plants, decaying vegetation and soil. The major outdoor mold, Alternaria and Cladosporium, are found in very high numbers during hot and dry conditions. Generally, a late summer - fall peak is seen for common outdoor fungal spores. Rain will temporarily lower outdoor mold spore count, but counts rise rapidly when the rainy period ends. . The most important indoor molds are Aspergillus and Penicillium. Dark, humid and poorly  ventilated basements are ideal sites for mold growth. The next most common sites of mold growth are the bathroom and the kitchen. Outdoor (Seasonal) Mold Control . Use air conditioning and keep windows closed. . Avoid exposure to decaying vegetation. Marland Kitchen Avoid leaf raking. . Avoid grain handling. . Consider wearing a face mask if working in moldy areas.  Indoor (Perennial) Mold Control  . Maintain humidity below 50%. . Get rid of mold growth on hard surfaces with water, detergent and, if necessary, 5% bleach (do not mix with other cleaners). Then dry the area completely. If mold covers an area more than 10 square feet, consider hiring an indoor environmental professional. . For clothing, washing with soap and water is best. If moldy items cannot be cleaned and dried, throw them away. . Remove sources e.g. contaminated carpets. . Repair and seal leaking roofs or pipes. Using dehumidifiers in damp basements may be helpful, but empty the water and clean units regularly to prevent mildew from forming. All rooms, especially basements, bathrooms and kitchens, require ventilation and cleaning to deter mold and mildew growth. Avoid carpeting on concrete or damp floors, and storing items in damp areas.  Skin care recommendations  Bath time: . Always use lukewarm water. AVOID very hot or cold water. Marland Kitchen Keep bathing time  to 5-10 minutes. . Do NOT use bubble bath. . Use a mild soap and use just enough to wash the dirty areas. . Do NOT scrub skin vigorously.  . After bathing, pat dry your skin with a towel. Do NOT rub or scrub the skin.  Moisturizers and prescriptions:  . ALWAYS apply moisturizers immediately after bathing (within 3 minutes). This helps to lock-in moisture. . Use the moisturizer several times a day over the whole body. Anne Jefferson summer moisturizers include: Aveeno, CeraVe, Cetaphil. Anne Jefferson winter moisturizers include: Aquaphor, Vaseline, Cerave, Cetaphil, Eucerin, Vanicream. . When using  moisturizers along with medications, the moisturizer should be applied about one hour after applying the medication to prevent diluting effect of the medication or moisturize around where you applied the medications. When not using medications, the moisturizer can be continued twice daily as maintenance.  Laundry and clothing: . Avoid laundry products with added color or perfumes. . Use unscented hypo-allergenic laundry products such as Tide free, Cheer free & gentle, and All free and clear.  . If the skin still seems dry or sensitive, you can try double-rinsing the clothes. . Avoid tight or scratchy clothing such as wool. . Do not use fabric softeners or dyer sheets.

## 2020-12-23 NOTE — Telephone Encounter (Signed)
Patient wants to transfer vials to [facility], sent vials to [facility] and patient is coming next week the week of 12/26/2020 to get her first injection.

## 2020-12-23 NOTE — Assessment & Plan Note (Signed)
Past history - 1 course of prednisone in 2022. Normal CXR in 2022.  Interim history - doing much better.   ACT score 24.  Today's spirometry showed: some restriction.  . Daily controller medication(s): Symbicort 66mcg 2 puffs twice a day with spacer and rinse mouth afterwards. . During upper respiratory infections/asthma flares: start Breztri 2 puffs twice a day for 1-2 weeks until your breathing symptoms return to baseline. Samples given.  . May use albuterol rescue inhaler 2 puffs every 4 to 6 hours as needed for shortness of breath, chest tightness, coughing, and wheezing. May use albuterol rescue inhaler 2 puffs 5 to 15 minutes prior to strenuous physical activities. Monitor frequency of use.   Get spirometry at next visit.

## 2020-12-23 NOTE — Assessment & Plan Note (Signed)
Past history - started AIT on 04/22/2019 (M + G/DM/C/D). Stopped AIT due to concerns if it was contributing to her shooting pain - no change. Interim history -  Worsening symptoms since stopped injections and wants to restart.   Continue environmental control measures.  Restart allergy injections at our [place] office due to work hours.   Restart blue vial (schedule B), gold vial (schedule B), green vial (schedule A), red vial (schedule A).  Use over the counter antihistamines such as Zyrtec (cetirizine), Claritin (loratadine), Allegra (fexofenadine), or Xyzal (levocetirizine) daily as needed. May take twice a day during allergy flares. May switch antihistamines every few months.  May use Flonase (fluticasone) OR Nasonex nasal spray 1 spray per nostril twice a day as needed for nasal congestion.   Nasal saline spray (i.e., Simply Saline) or nasal saline lavage (i.e., NeilMed) is recommended as needed and prior to medicated nasal sprays.

## 2020-12-23 NOTE — Assessment & Plan Note (Signed)
   Continue proper skin care. 

## 2020-12-24 ENCOUNTER — Telehealth: Payer: Self-pay

## 2020-12-24 NOTE — Telephone Encounter (Signed)
Updated flowsheet to reflect this change.

## 2020-12-24 NOTE — Telephone Encounter (Signed)
-----   Message from Garnet Sierras, DO sent at 12/23/2020  5:25 PM EDT ----- Please make a note on AIT flowsheet - restart with blue vial schedule B, gold vial schedule B, green vial schedule A and red vial schedule A. Thank you.

## 2020-12-28 ENCOUNTER — Ambulatory Visit (INDEPENDENT_AMBULATORY_CARE_PROVIDER_SITE_OTHER): Payer: 59 | Admitting: *Deleted

## 2020-12-28 DIAGNOSIS — J309 Allergic rhinitis, unspecified: Secondary | ICD-10-CM | POA: Diagnosis not present

## 2020-12-30 ENCOUNTER — Ambulatory Visit (INDEPENDENT_AMBULATORY_CARE_PROVIDER_SITE_OTHER): Payer: 59 | Admitting: *Deleted

## 2020-12-30 DIAGNOSIS — J309 Allergic rhinitis, unspecified: Secondary | ICD-10-CM | POA: Diagnosis not present

## 2020-12-30 DIAGNOSIS — R768 Other specified abnormal immunological findings in serum: Secondary | ICD-10-CM | POA: Diagnosis not present

## 2020-12-30 DIAGNOSIS — M47812 Spondylosis without myelopathy or radiculopathy, cervical region: Secondary | ICD-10-CM | POA: Diagnosis not present

## 2020-12-30 DIAGNOSIS — M542 Cervicalgia: Secondary | ICD-10-CM | POA: Diagnosis not present

## 2020-12-30 DIAGNOSIS — M546 Pain in thoracic spine: Secondary | ICD-10-CM | POA: Diagnosis not present

## 2021-01-04 ENCOUNTER — Ambulatory Visit (INDEPENDENT_AMBULATORY_CARE_PROVIDER_SITE_OTHER): Payer: 59 | Admitting: *Deleted

## 2021-01-04 DIAGNOSIS — J309 Allergic rhinitis, unspecified: Secondary | ICD-10-CM | POA: Diagnosis not present

## 2021-01-06 ENCOUNTER — Ambulatory Visit (INDEPENDENT_AMBULATORY_CARE_PROVIDER_SITE_OTHER): Payer: 59 | Admitting: *Deleted

## 2021-01-06 DIAGNOSIS — J309 Allergic rhinitis, unspecified: Secondary | ICD-10-CM

## 2021-01-11 ENCOUNTER — Ambulatory Visit (INDEPENDENT_AMBULATORY_CARE_PROVIDER_SITE_OTHER): Payer: 59 | Admitting: *Deleted

## 2021-01-11 DIAGNOSIS — J309 Allergic rhinitis, unspecified: Secondary | ICD-10-CM | POA: Diagnosis not present

## 2021-01-13 ENCOUNTER — Ambulatory Visit (INDEPENDENT_AMBULATORY_CARE_PROVIDER_SITE_OTHER): Payer: 59 | Admitting: *Deleted

## 2021-01-13 DIAGNOSIS — J309 Allergic rhinitis, unspecified: Secondary | ICD-10-CM

## 2021-01-18 ENCOUNTER — Ambulatory Visit (INDEPENDENT_AMBULATORY_CARE_PROVIDER_SITE_OTHER): Payer: 59 | Admitting: *Deleted

## 2021-01-18 DIAGNOSIS — J309 Allergic rhinitis, unspecified: Secondary | ICD-10-CM

## 2021-01-20 ENCOUNTER — Ambulatory Visit (INDEPENDENT_AMBULATORY_CARE_PROVIDER_SITE_OTHER): Payer: 59 | Admitting: *Deleted

## 2021-01-20 DIAGNOSIS — J309 Allergic rhinitis, unspecified: Secondary | ICD-10-CM

## 2021-01-20 DIAGNOSIS — D2271 Melanocytic nevi of right lower limb, including hip: Secondary | ICD-10-CM | POA: Diagnosis not present

## 2021-01-20 DIAGNOSIS — D225 Melanocytic nevi of trunk: Secondary | ICD-10-CM | POA: Diagnosis not present

## 2021-01-20 DIAGNOSIS — D692 Other nonthrombocytopenic purpura: Secondary | ICD-10-CM | POA: Diagnosis not present

## 2021-01-20 DIAGNOSIS — D2272 Melanocytic nevi of left lower limb, including hip: Secondary | ICD-10-CM | POA: Diagnosis not present

## 2021-01-20 DIAGNOSIS — L814 Other melanin hyperpigmentation: Secondary | ICD-10-CM | POA: Diagnosis not present

## 2021-01-20 DIAGNOSIS — D1801 Hemangioma of skin and subcutaneous tissue: Secondary | ICD-10-CM | POA: Diagnosis not present

## 2021-01-25 ENCOUNTER — Other Ambulatory Visit: Payer: Self-pay

## 2021-01-25 ENCOUNTER — Encounter: Payer: Self-pay | Admitting: Rheumatology

## 2021-01-25 ENCOUNTER — Ambulatory Visit: Payer: Self-pay

## 2021-01-25 ENCOUNTER — Ambulatory Visit (INDEPENDENT_AMBULATORY_CARE_PROVIDER_SITE_OTHER): Payer: 59 | Admitting: Rheumatology

## 2021-01-25 ENCOUNTER — Ambulatory Visit (INDEPENDENT_AMBULATORY_CARE_PROVIDER_SITE_OTHER): Payer: 59 | Admitting: *Deleted

## 2021-01-25 VITALS — BP 129/83 | HR 80 | Ht 64.0 in | Wt 177.0 lb

## 2021-01-25 DIAGNOSIS — Z8379 Family history of other diseases of the digestive system: Secondary | ICD-10-CM | POA: Diagnosis not present

## 2021-01-25 DIAGNOSIS — M5030 Other cervical disc degeneration, unspecified cervical region: Secondary | ICD-10-CM

## 2021-01-25 DIAGNOSIS — J3089 Other allergic rhinitis: Secondary | ICD-10-CM | POA: Diagnosis not present

## 2021-01-25 DIAGNOSIS — M79671 Pain in right foot: Secondary | ICD-10-CM | POA: Diagnosis not present

## 2021-01-25 DIAGNOSIS — M5134 Other intervertebral disc degeneration, thoracic region: Secondary | ICD-10-CM | POA: Diagnosis not present

## 2021-01-25 DIAGNOSIS — L2089 Other atopic dermatitis: Secondary | ICD-10-CM

## 2021-01-25 DIAGNOSIS — R768 Other specified abnormal immunological findings in serum: Secondary | ICD-10-CM | POA: Diagnosis not present

## 2021-01-25 DIAGNOSIS — M79641 Pain in right hand: Secondary | ICD-10-CM

## 2021-01-25 DIAGNOSIS — J309 Allergic rhinitis, unspecified: Secondary | ICD-10-CM

## 2021-01-25 DIAGNOSIS — M79642 Pain in left hand: Secondary | ICD-10-CM | POA: Diagnosis not present

## 2021-01-25 DIAGNOSIS — M21619 Bunion of unspecified foot: Secondary | ICD-10-CM | POA: Diagnosis not present

## 2021-01-25 DIAGNOSIS — M79672 Pain in left foot: Secondary | ICD-10-CM

## 2021-01-25 DIAGNOSIS — J4540 Moderate persistent asthma, uncomplicated: Secondary | ICD-10-CM | POA: Diagnosis not present

## 2021-01-25 NOTE — Patient Instructions (Signed)
Please stop prednisone 3 weeks prior to ultrasound of bilateral hands.   Please have AVISE labs completed 3 week prior to the ultrasound appointment.

## 2021-01-25 NOTE — Progress Notes (Addendum)
Office Visit Note  Patient: Anne Jefferson             Date of Birth: Dec 05, 1975           MRN: [id number]             PCP: Josetta Huddle, MD Referring: Erline Levine, MD Visit Date: 01/25/2021 Occupation: _0 @  Subjective:  Pain in multiple joints.   History of Present Illness: Anne Jefferson is a 45 y.o. female seen in consultation per request of Dr. Vertell Limber.  According to patient she has always had upper back pain since she was a child.  In November of 2020 when she started having increased neck pain, pain in her hands and her feet.  She was scheduled to have left bunionectomy but it was delayed due to discomfort.  She states the pain started radiating from her neck to her right arm.  She was seen at the emergency room where she had x-ray of her cervical spine and later MRI of her cervical and thoracic spine.  She also had a chest x-ray.  She is referred to a neurologist.  She states she had EMG and nerve conduction velocities which were normal.  There is labs which came positive for ANA.  She was referred to Dr. Dossie Der, rheumatologist who did a thorough evaluation and had nothing else to offer.  She went to see Dr. Vertell Limber as her neck pain persist.  Dr. Buddy Duty evaluation and did not find an explanation.  He advised her to see me for further evaluation.  She states her PCP placed her on prednisone 30 mg p.o. daily.  She states that the prednisone helped her with the pain symptoms.  She could not taper off prednisone.  She has been taking prednisone 30 mg p.o. daily now for 2 weeks.  She states mostly she has discomfort in her wrist, hands and her feet.  She will also continues to have discomfort in her upper back.  Her son has celiac disease.  Her mother has osteoarthritis.  Is gravida 3, para 3 miscarriages 0.  Activities of Daily Living:  Patient reports morning stiffness for all day. Patient Reports nocturnal pain.  Difficulty dressing/grooming: Denies Difficulty climbing  stairs: Denies Difficulty getting out of chair: Denies Difficulty using hands for taps, buttons, cutlery, and/or writing: Reports  Review of Systems  Constitutional:  Negative for fatigue.  HENT:  Negative for mouth sores, mouth dryness and nose dryness.   Eyes:  Positive for dryness. Negative for pain and itching.  Respiratory:  Negative for shortness of breath and difficulty breathing.   Cardiovascular:  Negative for chest pain and palpitations.  Gastrointestinal:  Negative for blood in stool, constipation and diarrhea.  Endocrine: Negative for increased urination.  Genitourinary:  Negative for difficulty urinating.  Musculoskeletal:  Positive for joint pain, joint pain, myalgias, muscle weakness, morning stiffness and myalgias. Negative for joint swelling.  Skin:  Negative for color change, rash, redness and sensitivity to sunlight.  Allergic/Immunologic: Negative for susceptible to infections.  Neurological:  Positive for dizziness and numbness. Negative for headaches, memory loss and weakness.  Hematological:  Negative for bruising/bleeding tendency and swollen glands.  Psychiatric/Behavioral:  Positive for sleep disturbance. Negative for confusion. The patient is nervous/anxious.    PMFS History:  Patient Active Problem List   Diagnosis Date Noted   Moderate persistent asthma without complication 65/46/5035   Shooting pain 07/29/2020   Atopic dermatitis 04/01/2019   Perennial and seasonal allergic rhinitis 09/17/2018   Infertility,  female    Abnormal Pap smear    H/O dysmenorrhea    Blood type, Rh negative    METATARSALGIA 12/18/2007   BUNIONS, LEFT FOOT 12/18/2007   UNEQUAL LEG LENGTH 12/18/2007   SPRAIN&STRAIN OF UNSPECIFIED SITE OF KNEE&LEG 11/13/2007    Past Medical History:  Diagnosis Date   Abnormal Pap smear    Anxiety    no meds   Asthma    mild, no issue for years no  inhaler use except in winter   Biliary colic    Blood type, Rh negative    Gallstones     H/O dysmenorrhea    Infertility, female    Nausea and vomiting    Pain    back and chest realted to gallbladder since november 2018   PONV (postoperative nausea and vomiting)    severe, needs heavy anti nausea meds   Spinal stenosis     Family History  Problem Relation Age of Onset   Allergic rhinitis Mother    Osteoarthritis Mother    Healthy Brother    Healthy Brother    Diabetes Maternal Grandmother    Skin cancer Maternal Grandmother    Celiac disease Son    Autoimmune disease Son    Anemia Son    Past Surgical History:  Procedure Laterality Date    c sections     x 2  3 births last was twins   BUNIONECTOMY Right    CHOLECYSTECTOMY N/A 11/22/2017   Procedure: [place];  Surgeon: Kieth Brightly, Arta Bruce, MD;  Location: [facility];  Service: General;  Laterality: N/A;   LEEP  12/23/2009   WISDOM TOOTH EXTRACTION     Social History   Social History Narrative   Not on file   Immunization History  Administered Date(s) Administered   Unspecified SARS-COV-2 Vaccination 08/04/2019, 08/25/2019, 04/30/2020     Objective: Vital Signs: BP 129/83 (BP Location: Right Arm, Patient Position: Sitting, Cuff Size: Normal)   Pulse 80   Ht _0  (1.626 m)   Wt 177 lb (80.3 kg)   BMI 30.38 kg/m    Physical Exam Vitals and nursing note reviewed.  Constitutional:      Appearance: She is well-developed.  HENT:     Head: Normocephalic and atraumatic.  Eyes:     Conjunctiva/sclera: Conjunctivae normal.  Cardiovascular:     Rate and Rhythm: Normal rate and regular rhythm.     Heart sounds: Normal heart sounds.  Pulmonary:     Effort: Pulmonary effort is normal.     Breath sounds: Normal breath sounds.  Abdominal:     General: Bowel sounds are normal.     Palpations: Abdomen is soft.  Musculoskeletal:     Cervical back: Normal range of motion.  Lymphadenopathy:     Cervical: No cervical adenopathy.  Skin:    General: Skin is warm and dry.      Capillary Refill: Capillary refill takes less than 2 seconds.  Neurological:     Mental Status: She is alert and oriented to person, place, and time.  Psychiatric:        Behavior: Behavior normal.     Musculoskeletal Exam: Spine was in good range of motion.  She describes tenderness in the upper thoracic region.  Shoulder joints, elbow joints, wrist joints, MCPs PIPs and DIPs with good range of motion.  She is some hypermobility in MCPs and PIPs.  She had DIP and PIP prominence but no synovitis was noted.  Hip  joints and knee joints in good range of motion.  She had right bunionectomy in the past.  She has thickening of her left first MTP joint.  PIP and DIP thickening was noted.  No synovitis was noted.  CDAI Exam: CDAI Score: -- Patient Global: --; Provider Global: -- Swollen: --; Tender: -- Joint Exam 01/25/2021   No joint exam has been documented for this visit   There is currently no information documented on the homunculus. Go to the Rheumatology activity and complete the homunculus joint exam.  Investigation: No additional findings.  Imaging: No results found.  Recent Labs: Lab Results  Component Value Date   WBC 6.1 09/01/2020   HGB 13.1 09/01/2020   PLT 250 09/01/2020   NA 138 09/01/2020   K 3.9 09/01/2020   CL 101 09/01/2020   CO2 26 09/01/2020   GLUCOSE 102 (H) 09/01/2020   BUN 15 09/01/2020   CREATININE 0.73 09/01/2020   BILITOT 0.6 09/01/2020   ALKPHOS 42 09/01/2020   AST 23 09/01/2020   ALT 32 09/01/2020   PROT 7.0 09/01/2020   ALBUMIN 3.8 09/01/2020   CALCIUM 9.1 09/01/2020   GFRAA 112 08/31/2020   February 2022 IgA low at 69, SPEP negative, ANA 1: 160, (dsDNA, SSA negative, SSB negative, RNP, Smith, SCL 70, antiribosomal P, Jo 1 negative),MPO antibody negative, proteinase 3 negative, c-ANCA negative, p-ANCA negative, antigliadin negative, anti-TTG negative, ESR 3, C-reactive protein less than 1, HIV negative, hepatitis B-, hepatitis C  negative   Speciality Comments: No specialty comments available.  Procedures:  No procedures performed Allergies: Promethazine, Theophyllines, and Mite (d. farinae)   Assessment / Plan:     Visit Diagnoses: ANA positive - ANA 1: 160 speckled, ENA negative, Jo 1 negative.  She is no history of oral ulcers, nasal ulcers, malar rash, photosensitivity, Raynaud's phenomenon.  Patient is concerned that she has underlying autoimmune disease.  I do not see any clinical features of autoimmune disease on the examination.  Although she is also on prednisone which could be masking inflammation.  I advised her to get [facility] labs 3 weeks after stopping prednisone.  She will have to taper off prednisone gradually.  Pain in both hands -she gives history of pain and discomfort in her bilateral hands.  I do not see any synovitis.  She has been on prednisone 30 mg p.o. daily for the last 2 weeks.  I advised her to come off prednisone so we can examine her and look for synovitis.  She plans to do it after the next few weeks.  Plan: XR Hand 2 View Right, XR Hand 2 View Left.  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  I also plan to get ultrasound of her bilateral hands after she is off prednisone.   Pain in both feet -she had right foot bunionectomy and is planning to get right foot bunionectomy in the future.  She complains of severe pain and discomfort in her feet.  No synovitis was noted but she is on prednisone.  She also has bilateral pes planus.  Plan: XR Foot 2 Views Right, XR Foot 2 Views Left.  X-ray of the left foot showed surgical changes from bunionectomy.  Both feet were consistent with early osteoarthritis.  DDD (degenerative disc disease), cervical-she gives history of neck pain and right-sided paresthesias.  She had neurology evaluation in the past including EMG nerve conduction velocities which were normal.  She had MRI of her brain and also MRI of her cervical  spine in the past.   MRI of the brain was normal except acute left sphenoid sinusitis.  MRI of cervical spine showed multilevel disc disease and mild spinal stenosis.  MRI results were reviewed and discussed with the patient.  She was also evaluated by Dr. Vertell Limber who did not feel that her symptoms were related to the disc disease.  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  MRI results were reviewed and discussed with the patient.  She describes pain mostly in the upper thoracic region.  BUNIONS, LEFT FOOT-she has been seeing a podiatrist and wants to get left bunionectomy in the future.  Other medical problems are listed as follows:  Other atopic dermatitis  Perennial and seasonal allergic rhinitis  Moderate persistent asthma without complication  Family history of celiac disease - son  Orders: Orders Placed This Encounter  Procedures   XR Hand 2 View Right   XR Hand 2 View Left   XR Foot 2 Views Right   XR Foot 2 Views Left    No orders of the defined types were placed in this encounter.    Follow-Up Instructions: Return in about 6 weeks (around 03/08/2021) for Positive ANA.   Bo Merino, MD  Note - This record has been created using Editor, commissioning.  Chart creation errors have been sought, but may not always  have been located. Such creation errors do not reflect on  the standard of medical care.

## 2021-01-27 ENCOUNTER — Ambulatory Visit (INDEPENDENT_AMBULATORY_CARE_PROVIDER_SITE_OTHER): Payer: 59 | Admitting: *Deleted

## 2021-01-27 DIAGNOSIS — J309 Allergic rhinitis, unspecified: Secondary | ICD-10-CM

## 2021-02-08 ENCOUNTER — Ambulatory Visit (INDEPENDENT_AMBULATORY_CARE_PROVIDER_SITE_OTHER): Payer: 59

## 2021-02-08 DIAGNOSIS — J309 Allergic rhinitis, unspecified: Secondary | ICD-10-CM

## 2021-02-10 ENCOUNTER — Ambulatory Visit (INDEPENDENT_AMBULATORY_CARE_PROVIDER_SITE_OTHER): Payer: 59 | Admitting: *Deleted

## 2021-02-10 DIAGNOSIS — J309 Allergic rhinitis, unspecified: Secondary | ICD-10-CM | POA: Diagnosis not present

## 2021-02-17 ENCOUNTER — Ambulatory Visit (INDEPENDENT_AMBULATORY_CARE_PROVIDER_SITE_OTHER): Payer: 59 | Admitting: *Deleted

## 2021-02-17 DIAGNOSIS — J309 Allergic rhinitis, unspecified: Secondary | ICD-10-CM | POA: Diagnosis not present

## 2021-02-22 ENCOUNTER — Ambulatory Visit (INDEPENDENT_AMBULATORY_CARE_PROVIDER_SITE_OTHER): Payer: 59

## 2021-02-22 DIAGNOSIS — J309 Allergic rhinitis, unspecified: Secondary | ICD-10-CM

## 2021-02-22 MED ORDER — ALBUTEROL SULFATE HFA 108 (90 BASE) MCG/ACT IN AERS: 2.0000 | INHALATION_SPRAY | RESPIRATORY_TRACT | 1 refills | Status: DC | PRN

## 2021-02-24 ENCOUNTER — Ambulatory Visit (INDEPENDENT_AMBULATORY_CARE_PROVIDER_SITE_OTHER): Payer: 59

## 2021-02-24 DIAGNOSIS — J309 Allergic rhinitis, unspecified: Secondary | ICD-10-CM

## 2021-03-03 ENCOUNTER — Ambulatory Visit (INDEPENDENT_AMBULATORY_CARE_PROVIDER_SITE_OTHER): Payer: 59 | Admitting: *Deleted

## 2021-03-03 DIAGNOSIS — J309 Allergic rhinitis, unspecified: Secondary | ICD-10-CM | POA: Diagnosis not present

## 2021-03-09 ENCOUNTER — Ambulatory Visit (INDEPENDENT_AMBULATORY_CARE_PROVIDER_SITE_OTHER): Payer: 59 | Admitting: Rheumatology

## 2021-03-09 ENCOUNTER — Other Ambulatory Visit: Payer: Self-pay

## 2021-03-09 ENCOUNTER — Encounter: Payer: Self-pay | Admitting: Rheumatology

## 2021-03-09 ENCOUNTER — Ambulatory Visit: Payer: Self-pay

## 2021-03-09 VITALS — BP 131/87 | HR 79 | Ht 63.0 in | Wt 188.0 lb

## 2021-03-09 DIAGNOSIS — M79641 Pain in right hand: Secondary | ICD-10-CM | POA: Diagnosis not present

## 2021-03-09 DIAGNOSIS — R768 Other specified abnormal immunological findings in serum: Secondary | ICD-10-CM

## 2021-03-09 DIAGNOSIS — M79642 Pain in left hand: Secondary | ICD-10-CM | POA: Diagnosis not present

## 2021-03-09 NOTE — Progress Notes (Deleted)
Office Visit Note  Patient: Anne Jefferson             Date of Birth: [birth year]           MRN: [id number]             PCP: Josetta Huddle, MD Referring: Josetta Huddle, MD Visit Date: 03/09/2021 Occupation: '@GUAROCC'$ @  Subjective:  No chief complaint on file.   History of Present Illness: Anne Jefferson is a 45 y.o. female ***   Activities of Daily Living:  Patient reports morning stiffness for 1 hour.   Patient Reports nocturnal pain.  Difficulty dressing/grooming: Denies Difficulty climbing stairs: Denies Difficulty getting out of chair: Denies Difficulty using hands for taps, buttons, cutlery, and/or writing: Reports  Review of Systems  Constitutional:  Positive for fatigue.  HENT:  Negative for mouth sores, mouth dryness and nose dryness.   Eyes:  Positive for dryness. Negative for pain and itching.  Respiratory:  Negative for shortness of breath and difficulty breathing.   Cardiovascular:  Negative for chest pain and palpitations.  Gastrointestinal:  Positive for constipation and diarrhea. Negative for blood in stool.  Endocrine: Negative for increased urination.  Genitourinary:  Negative for difficulty urinating.  Musculoskeletal:  Positive for joint pain, joint pain, joint swelling and morning stiffness. Negative for myalgias, muscle tenderness and myalgias.  Skin:  Negative for color change, rash and redness.  Allergic/Immunologic: Negative for susceptible to infections.  Neurological:  Positive for dizziness and numbness. Negative for headaches, memory loss and weakness.  Hematological:  Negative for bruising/bleeding tendency.  Psychiatric/Behavioral:  Negative for confusion.    PMFS History:  Patient Active Problem List   Diagnosis Date Noted   Moderate persistent asthma without complication 99991111   Shooting pain 07/29/2020   Atopic dermatitis 04/01/2019   Perennial and seasonal allergic rhinitis 09/17/2018   Infertility, female     Abnormal Pap smear    H/O dysmenorrhea    Blood type, Rh negative    METATARSALGIA 12/18/2007   BUNIONS, LEFT FOOT 12/18/2007   UNEQUAL LEG LENGTH 12/18/2007   SPRAIN&STRAIN OF UNSPECIFIED SITE OF KNEE&LEG 11/13/2007    Past Medical History:  Diagnosis Date   Abnormal Pap smear    Anxiety    no meds   Asthma    mild, no issue for years no  inhaler use except in winter   Biliary colic    Blood type, Rh negative    Gallstones    H/O dysmenorrhea    Infertility, female    Nausea and vomiting    Pain    back and chest realted to gallbladder since november 2018   PONV (postoperative nausea and vomiting)    severe, needs heavy anti nausea meds   Spinal stenosis     Family History  Problem Relation Age of Onset   Allergic rhinitis Mother    Osteoarthritis Mother    Healthy Brother    Healthy Brother    Diabetes Maternal Grandmother    Skin cancer Maternal Grandmother    Celiac disease Son    Autoimmune disease Son    Anemia Son    Past Surgical History:  Procedure Laterality Date    c sections     x 2  3 births last was twins   BUNIONECTOMY Right    CHOLECYSTECTOMY N/A 11/22/2017   Procedure: [place];  Surgeon: Kieth Brightly Arta Bruce, MD;  Location: [facility];  Service: General;  Laterality: N/A;   LEEP  12/23/2009  WISDOM TOOTH EXTRACTION     Social History   Social History Narrative   Not on file   Immunization History  Administered Date(s) Administered   Unspecified SARS-COV-2 Vaccination 08/04/2019, 08/25/2019, 04/30/2020     Objective: Vital Signs: BP 131/87 (BP Location: Left Arm, Patient Position: Sitting, Cuff Size: Normal)   Pulse 79   Ht '5\' 3"'$  (1.6 m)   Wt 188 lb (85.3 kg)   BMI 33.30 kg/m    Physical Exam   Musculoskeletal Exam: ***  CDAI Exam: CDAI Score: -- Patient Global: --; Provider Global: -- Swollen: --; Tender: -- Joint Exam 03/09/2021   No joint exam has been documented for this visit   There is  currently no information documented on the homunculus. Go to the Rheumatology activity and complete the homunculus joint exam.  Investigation: No additional findings.  Imaging: [place] COMPLETE JOINT SPACE STRUCTURES UP BILAT  Result Date: 03/09/2021 Ultrasound examination of bilateral hands was performed per EULAR recommendations. Using 12 MHz transducer, grayscale and power Doppler bilateral second, third, and fifth MCP joints and bilateral wrist joints both dorsal and volar aspects were evaluated to look for synovitis or tenosynovitis. The findings were there was no synovitis or tenosynovitis on ultrasound examination. Right median nerve was 0.05 cm squares which was within normal limits and left median nerve was 0.06 cm squares which was within normal limits. Impression: Ultrasound examination of bilateral hands did not show any synovitis.  Bilateral median nerves within normal limits.   Recent Labs: Lab Results  Component Value Date   WBC 6.1 09/01/2020   HGB 13.1 09/01/2020   PLT 250 09/01/2020   NA 138 09/01/2020   K 3.9 09/01/2020   CL 101 09/01/2020   CO2 26 09/01/2020   GLUCOSE 102 (H) 09/01/2020   BUN 15 09/01/2020   CREATININE 0.73 09/01/2020   BILITOT 0.6 09/01/2020   ALKPHOS 42 09/01/2020   AST 23 09/01/2020   ALT 32 09/01/2020   PROT 7.0 09/01/2020   ALBUMIN 3.8 09/01/2020   CALCIUM 9.1 09/01/2020   GFRAA 112 08/31/2020     Speciality Comments: No specialty comments available.  Procedures:  No procedures performed Allergies: Promethazine, Theophyllines, and Mite (d. farinae)   Assessment / Plan:     Visit Diagnoses: Pain in both hands - Treated with prednisone.  Plan ultrasound to rule out synovitis.  Her x-rays were consistent with early osteoarthritis. - Plan: [place] COMPLETE JOINT SPACE STRUCTURES UP BILAT  ANA positive - ANA 1: 160NS.  No history of oral ulcers, nasal ulcers, malar rash, photosensitivity, Raynaud's.  Orders: Orders Placed This Encounter   Procedures   [place] COMPLETE JOINT SPACE STRUCTURES UP BILAT   No orders of the defined types were placed in this encounter.   Face-to-face time spent with patient was *** minutes. Greater than 50% of time was spent in counseling and coordination of care.  Follow-Up Instructions: No follow-ups on file.   Bo Merino, MD  Note - This record has been created using Editor, commissioning.  Chart creation errors have been sought, but may not always  have been located. Such creation errors do not reflect on  the standard of medical care.

## 2021-03-17 ENCOUNTER — Ambulatory Visit (INDEPENDENT_AMBULATORY_CARE_PROVIDER_SITE_OTHER): Payer: 59 | Admitting: *Deleted

## 2021-03-17 DIAGNOSIS — J309 Allergic rhinitis, unspecified: Secondary | ICD-10-CM | POA: Diagnosis not present

## 2021-03-21 DIAGNOSIS — Z1231 Encounter for screening mammogram for malignant neoplasm of breast: Secondary | ICD-10-CM | POA: Diagnosis not present

## 2021-03-22 ENCOUNTER — Ambulatory Visit (INDEPENDENT_AMBULATORY_CARE_PROVIDER_SITE_OTHER): Payer: 59 | Admitting: *Deleted

## 2021-03-22 DIAGNOSIS — J309 Allergic rhinitis, unspecified: Secondary | ICD-10-CM | POA: Diagnosis not present

## 2021-03-29 ENCOUNTER — Ambulatory Visit (INDEPENDENT_AMBULATORY_CARE_PROVIDER_SITE_OTHER): Payer: 59

## 2021-03-29 DIAGNOSIS — J309 Allergic rhinitis, unspecified: Secondary | ICD-10-CM

## 2021-04-04 NOTE — Progress Notes (Deleted)
Office Visit Note  Patient: Anne Jefferson             Date of Birth: 1976-04-06           MRN: [id number]             PCP: Josetta Huddle, MD Referring: Josetta Huddle, MD Visit Date: 04/15/2021 Occupation: '@GUAROCC' @  Subjective:  No chief complaint on file.   History of Present Illness: [name] is a 45 y.o. female ***   Activities of Daily Living:  Patient reports morning stiffness for *** {minute/hour:19697}.   Patient {ACTIONS;DENIES/REPORTS:21021675::"Denies"} nocturnal pain.  Difficulty dressing/grooming: {ACTIONS;DENIES/REPORTS:21021675::"Denies"} Difficulty climbing stairs: {ACTIONS;DENIES/REPORTS:21021675::"Denies"} Difficulty getting out of chair: {ACTIONS;DENIES/REPORTS:21021675::"Denies"} Difficulty using hands for taps, buttons, cutlery, and/or writing: {ACTIONS;DENIES/REPORTS:21021675::"Denies"}  No Rheumatology ROS completed.   PMFS History:  Patient Active Problem List   Diagnosis Date Noted   Moderate persistent asthma without complication 31/49/7026   Shooting pain 07/29/2020   Atopic dermatitis 04/01/2019   Perennial and seasonal allergic rhinitis 09/17/2018   Infertility, female    Abnormal Pap smear    H/O dysmenorrhea    Blood type, Rh negative    METATARSALGIA 12/18/2007   BUNIONS, LEFT FOOT 12/18/2007   UNEQUAL LEG LENGTH 12/18/2007   SPRAIN&STRAIN OF UNSPECIFIED SITE OF KNEE&LEG 11/13/2007    Past Medical History:  Diagnosis Date   Abnormal Pap smear    Anxiety    no meds   Asthma    mild, no issue for years no  inhaler use except in winter   Biliary colic    Blood type, Rh negative    Gallstones    H/O dysmenorrhea    Infertility, female    Nausea and vomiting    Pain    back and chest realted to gallbladder since november 2018   PONV (postoperative nausea and vomiting)    severe, needs heavy anti nausea meds   Spinal stenosis     Family History  Problem Relation Age of Onset   Allergic rhinitis Mother     Osteoarthritis Mother    Healthy Brother    Healthy Brother    Diabetes Maternal Grandmother    Skin cancer Maternal Grandmother    Celiac disease Son    Autoimmune disease Son    Anemia Son    Past Surgical History:  Procedure Laterality Date    c sections     x 2  3 births last was twins   BUNIONECTOMY Right    CHOLECYSTECTOMY N/A 11/22/2017   Procedure: [place];  Surgeon: Kieth Brightly Arta Bruce, MD;  Location: [facility];  Service: General;  Laterality: N/A;   LEEP  12/23/2009   WISDOM TOOTH EXTRACTION     Social History   Social History Narrative   Not on file   Immunization History  Administered Date(s) Administered   Unspecified SARS-COV-2 Vaccination 08/04/2019, 08/25/2019, 04/30/2020     Objective: Vital Signs: There were no vitals taken for this visit.   Physical Exam   Musculoskeletal Exam: ***  CDAI Exam: CDAI Score: -- Patient Global: --; Provider Global: -- Swollen: --; Tender: -- Joint Exam 04/15/2021   No joint exam has been documented for this visit   There is currently no information documented on the homunculus. Go to the Rheumatology activity and complete the homunculus joint exam.  Investigation: No additional findings.  Imaging: [place] COMPLETE JOINT SPACE STRUCTURES UP BILAT  Result Date: 03/09/2021 Ultrasound examination of bilateral hands was performed per EULAR recommendations. Using 12 MHz  transducer, grayscale and power Doppler bilateral second, third, and fifth MCP joints and bilateral wrist joints both dorsal and volar aspects were evaluated to look for synovitis or tenosynovitis. The findings were there was no synovitis or tenosynovitis on ultrasound examination. Right median nerve was 0.05 cm squares which was within normal limits and left median nerve was 0.06 cm squares which was within normal limits. Impression: Ultrasound examination of bilateral hands did not show any synovitis.  Bilateral median nerves  within normal limits.   Recent Labs: Lab Results  Component Value Date   WBC 6.1 09/01/2020   HGB 13.1 09/01/2020   PLT 250 09/01/2020   NA 138 09/01/2020   K 3.9 09/01/2020   CL 101 09/01/2020   CO2 26 09/01/2020   GLUCOSE 102 (H) 09/01/2020   BUN 15 09/01/2020   CREATININE 0.73 09/01/2020   BILITOT 0.6 09/01/2020   ALKPHOS 42 09/01/2020   AST 23 09/01/2020   ALT 32 09/01/2020   PROT 7.0 09/01/2020   ALBUMIN 3.8 09/01/2020   CALCIUM 9.1 09/01/2020   GFRAA 112 08/31/2020   February 2022 IgA low at 69, SPEP negative, ANA 1: 160, (dsDNA, SSA negative, SSB negative, RNP, Smith, SCL 70, antiribosomal P, Jo 1 negative),MPO antibody negative, proteinase 3 negative, c-ANCA negative, p-ANCA negative, antigliadin negative, anti-TTG negative, ESR 3, C-reactive protein less than 1, HIV negative, hepatitis B-, hepatitis C negative  [facility]  Speciality Comments: No specialty comments available.  Procedures:  No procedures performed Allergies: Promethazine, Theophyllines, and Mite (d. farinae)   Assessment / Plan:     Visit Diagnoses: ANA positive  Primary osteoarthritis of both hands  Primary osteoarthritis of both feet  DDD (degenerative disc disease), cervical  DDD (degenerative disc disease), thoracic  Moderate persistent asthma without complication  Perennial and seasonal allergic rhinitis  Other atopic dermatitis  Family history of celiac disease  Orders: No orders of the defined types were placed in this encounter.  No orders of the defined types were placed in this encounter.   Face-to-face time spent with patient was *** minutes. Greater than 50% of time was spent in counseling and coordination of care.  Follow-Up Instructions: No follow-ups on file.   Bo Merino, MD  Note - This record has been created using Editor, commissioning.  Chart creation errors have been sought, but may not always  have been located. Such creation errors do not reflect on  the  standard of medical care.

## 2021-04-05 ENCOUNTER — Ambulatory Visit (INDEPENDENT_AMBULATORY_CARE_PROVIDER_SITE_OTHER): Payer: 59 | Admitting: *Deleted

## 2021-04-05 DIAGNOSIS — J309 Allergic rhinitis, unspecified: Secondary | ICD-10-CM | POA: Diagnosis not present

## 2021-04-07 DIAGNOSIS — L2089 Other atopic dermatitis: Secondary | ICD-10-CM | POA: Diagnosis not present

## 2021-04-07 DIAGNOSIS — R768 Other specified abnormal immunological findings in serum: Secondary | ICD-10-CM | POA: Diagnosis not present

## 2021-04-11 DIAGNOSIS — J3089 Other allergic rhinitis: Secondary | ICD-10-CM | POA: Diagnosis not present

## 2021-04-11 NOTE — Progress Notes (Signed)
VIALS MADE. EXP 04-11-22

## 2021-04-12 ENCOUNTER — Ambulatory Visit (INDEPENDENT_AMBULATORY_CARE_PROVIDER_SITE_OTHER): Payer: 59 | Admitting: *Deleted

## 2021-04-12 DIAGNOSIS — J309 Allergic rhinitis, unspecified: Secondary | ICD-10-CM

## 2021-04-15 ENCOUNTER — Ambulatory Visit: Payer: 59 | Admitting: Rheumatology

## 2021-04-15 DIAGNOSIS — R768 Other specified abnormal immunological findings in serum: Secondary | ICD-10-CM

## 2021-04-15 DIAGNOSIS — M19041 Primary osteoarthritis, right hand: Secondary | ICD-10-CM

## 2021-04-15 DIAGNOSIS — J4540 Moderate persistent asthma, uncomplicated: Secondary | ICD-10-CM

## 2021-04-15 DIAGNOSIS — Z8379 Family history of other diseases of the digestive system: Secondary | ICD-10-CM

## 2021-04-15 DIAGNOSIS — L2089 Other atopic dermatitis: Secondary | ICD-10-CM

## 2021-04-15 DIAGNOSIS — M5134 Other intervertebral disc degeneration, thoracic region: Secondary | ICD-10-CM

## 2021-04-15 DIAGNOSIS — M5030 Other cervical disc degeneration, unspecified cervical region: Secondary | ICD-10-CM

## 2021-04-15 DIAGNOSIS — J3089 Other allergic rhinitis: Secondary | ICD-10-CM

## 2021-04-15 DIAGNOSIS — M19071 Primary osteoarthritis, right ankle and foot: Secondary | ICD-10-CM

## 2021-04-21 ENCOUNTER — Ambulatory Visit (INDEPENDENT_AMBULATORY_CARE_PROVIDER_SITE_OTHER): Payer: 59

## 2021-04-21 DIAGNOSIS — J309 Allergic rhinitis, unspecified: Secondary | ICD-10-CM

## 2021-04-23 NOTE — Progress Notes (Signed)
Office Visit Note  Patient: Anne Jefferson             Date of Birth: [birth year]           MRN: [id number]             PCP: Josetta Huddle, MD Referring: Josetta Huddle, MD Visit Date: 05/02/2021 Occupation: _0 @  Subjective:  Pain in multiple joints.   History of Present Illness: Anne Jefferson is a 45 y.o. female with history of pain in multiple joints.  She states she continues to have pain and discomfort in her neck, upper back, her hands and her feet.  She noticed improvement while she was on prednisone.  She states the pain is severe.  She has tried Ultram in the past which was not effective.  Activities of Daily Living:  Patient reports morning stiffness for all day. Patient Reports nocturnal pain.  Difficulty dressing/grooming: Denies Difficulty climbing stairs: Denies Difficulty getting out of chair: Denies Difficulty using hands for taps, buttons, cutlery, and/or writing: Reports  Review of Systems  Constitutional:  Negative for fatigue.  HENT:  Negative for mouth sores, mouth dryness and nose dryness.   Eyes:  Negative for pain, itching and dryness.  Respiratory:  Negative for shortness of breath and difficulty breathing.   Cardiovascular:  Negative for chest pain and palpitations.  Gastrointestinal:  Positive for diarrhea. Negative for blood in stool and constipation.  Endocrine: Negative for increased urination.  Genitourinary:  Negative for difficulty urinating.  Musculoskeletal:  Positive for joint pain, joint pain, joint swelling, myalgias, morning stiffness and myalgias. Negative for muscle tenderness.  Skin:  Negative for color change, rash and redness.  Allergic/Immunologic: Negative for susceptible to infections.  Neurological:  Positive for numbness and weakness. Negative for dizziness, headaches and memory loss.  Hematological:  Negative for bruising/bleeding tendency.  Psychiatric/Behavioral:  Negative for confusion.    PMFS History:   Patient Active Problem List   Diagnosis Date Noted   Moderate persistent asthma without complication 19/62/2297   Shooting pain 07/29/2020   Atopic dermatitis 04/01/2019   Perennial and seasonal allergic rhinitis 09/17/2018   Infertility, female    Abnormal Pap smear    H/O dysmenorrhea    Blood type, Rh negative    METATARSALGIA 12/18/2007   BUNIONS, LEFT FOOT 12/18/2007   UNEQUAL LEG LENGTH 12/18/2007   SPRAIN&STRAIN OF UNSPECIFIED SITE OF KNEE&LEG 11/13/2007    Past Medical History:  Diagnosis Date   Abnormal Pap smear    Anxiety    no meds   Asthma    mild, no issue for years no  inhaler use except in winter   Biliary colic    Blood type, Rh negative    Gallstones    H/O dysmenorrhea    Infertility, female    Nausea and vomiting    Pain    back and chest realted to gallbladder since november 2018   PONV (postoperative nausea and vomiting)    severe, needs heavy anti nausea meds   Spinal stenosis     Family History  Problem Relation Age of Onset   Allergic rhinitis Mother    Osteoarthritis Mother    Healthy Brother    Healthy Brother    Diabetes Maternal Grandmother    Skin cancer Maternal Grandmother    Celiac disease Son    Autoimmune disease Son    Anemia Son    Diabetes Son        borderline   Past Surgical History:  Procedure Laterality Date    c sections     x 2  3 births last was twins   BUNIONECTOMY Right    CHOLECYSTECTOMY N/A 11/22/2017   Procedure: [place];  Surgeon: Mickeal Skinner, MD;  Location: [facility];  Service: General;  Laterality: N/A;   LEEP  12/23/2009   WISDOM TOOTH EXTRACTION     Social History   Social History Narrative   Not on file   Immunization History  Administered Date(s) Administered   Unspecified SARS-COV-2 Vaccination 08/04/2019, 08/25/2019, 04/30/2020     Objective: Vital Signs: BP 126/85 (BP Location: Left Arm, Patient Position: Sitting, Cuff Size: Normal)   Pulse 87    Ht _0  (1.6 m)   Wt 178 lb 9.6 oz (81 kg)   BMI 31.64 kg/m    Physical Exam Vitals and nursing note reviewed.  Constitutional:      Appearance: She is well-developed.  HENT:     Head: Normocephalic and atraumatic.  Eyes:     Conjunctiva/sclera: Conjunctivae normal.  Cardiovascular:     Rate and Rhythm: Normal rate and regular rhythm.     Heart sounds: Normal heart sounds.  Pulmonary:     Effort: Pulmonary effort is normal.     Breath sounds: Normal breath sounds.  Abdominal:     General: Bowel sounds are normal.     Palpations: Abdomen is soft.  Musculoskeletal:     Cervical back: Normal range of motion.  Lymphadenopathy:     Cervical: No cervical adenopathy.  Skin:    General: Skin is warm and dry.     Capillary Refill: Capillary refill takes less than 2 seconds.  Neurological:     Mental Status: She is alert and oriented to person, place, and time.  Psychiatric:        Behavior: Behavior normal.     Musculoskeletal Exam: She had painful limited range of motion of her cervical and thoracic spine.  Shoulder joints, elbow joints, wrist joints, MCPs PIPs and DIPs with good range of motion with no synovitis.  Hip joints, knee joints, ankles, MTPs and PIPs with good range of motion with no synovitis.  She had tenderness across her MTPs.  CDAI Exam: CDAI Score: -- Patient Global: --; Provider Global: -- Swollen: --; Tender: -- Joint Exam 05/02/2021   No joint exam has been documented for this visit   There is currently no information documented on the homunculus. Go to the Rheumatology activity and complete the homunculus joint exam.  Investigation: No additional findings.  Imaging: No results found.  Recent Labs: Lab Results  Component Value Date   WBC 6.1 09/01/2020   HGB 13.1 09/01/2020   PLT 250 09/01/2020   NA 138 09/01/2020   K 3.9 09/01/2020   CL 101 09/01/2020   CO2 26 09/01/2020   GLUCOSE 102 (H) 09/01/2020   BUN 15 09/01/2020   CREATININE 0.73  09/01/2020   BILITOT 0.6 09/01/2020   ALKPHOS 42 09/01/2020   AST 23 09/01/2020   ALT 32 09/01/2020   PROT 7.0 09/01/2020   ALBUMIN 3.8 09/01/2020   CALCIUM 9.1 09/01/2020   GFRAA 112 08/31/2020   February 2022 IgA low at 69, SPEP negative, ANA 1: 160, (dsDNA, SSA negative, SSB negative, RNP, Smith, SCL 70, antiribosomal P, Jo 1 negative),MPO antibody negative, proteinase 3 negative, c-ANCA negative, p-ANCA negative, antigliadin negative, anti-TTG negative, ESR 3, C-reactive protein less than 1, HIV negative, hepatitis B-, hepatitis C negative  April 07, 2021 AVISE lupus index -1.6, ANA 1: 320 speckled, ENA negative, CB CAP negative, Jo 1 negative, anticardiolipin negative, beta-2 GP 1 negative, antiphosphatidylserine negative, antihistone negative, car P negative, RF negative, anti-CCP negative, anti-TPO negative, antithyroglobulin negative   Speciality Comments: No specialty comments available.  Procedures:  No procedures performed Allergies: Promethazine, Theophyllines, and Mite (d. farinae)   Assessment / Plan:     Visit Diagnoses: ANA positive - AVISE lupus index negative.  Besides ANA all other labs were negative.  Left findings were reviewed with the patient.  There is no history of oral ulcers, nasal ulcers, sicca symptoms, Raynaud's phenomenon, malar rash, photosensitivity, lymphadenopathy or inflammatory arthritis.  She had no synovitis on my examination.  Primary osteoarthritis of both hands - Patient was on prednisone at the last visit.  No synovitis was noted today or at the last visit.  I also performed ultrasound of bilateral hands on March 09, 2021 which was negative for synovitis.  Bilateral median nerves are within normal limits.  We had detailed discussion regarding use of NSAIDs today.  I have advised her to discuss that further with her PCP.  She will need monitoring of CBC and CMP every 6 months.  Side effects of NSAID use were also discussed.  I gave her a list of  natural anti-inflammatories.  I offered physical and occupational therapy for her hands.  She requested a prescription for PT and OT which was given.  She will try to contact the location for physical therapy which is open after hours.  A handout on hand exercises was given.  Primary osteoarthritis of both feet -she continues to have pain and discomfort in her bilateral feet.  Clinical and radiographic findings are consistent with osteoarthritis.  Status post right bunionectomy.  DDD (degenerative disc disease), cervical - MRI of the cervical spine in the past showed multilevel degenerative changes and mild spinal stenosis.  She has been evaluated by Dr. Vertell Limber.  She had limited range of motion of her cervical spine with discomfort.  Prescription for physical therapy was given.  I also gave her a handout on C-spine exercises.  DDD (degenerative disc disease), thoracic - MRI in the past showed degenerative changes.  She continues to have some thoracic discomfort.  Prescription for physical therapy was given.  A handout on back exercises was given.  Chronic pain syndrome-she has been suffering from a lot of pain.  She may benefit from Cymbalta for the management of musculoskeletal pain.  She also appears to be having situational depression and anxiety.  I have advised her to discuss use of Cymbalta with her PCP.  Other atopic dermatitis  Moderate persistent asthma without complication  Perennial and seasonal allergic rhinitis  Family history of celiac disease - son.  Orders: No orders of the defined types were placed in this encounter.  No orders of the defined types were placed in this encounter.    Follow-Up Instructions: Return if symptoms worsen or fail to improve, for Osteoarthritis.   Bo Merino, MD  Note - This record has been created using Editor, commissioning.  Chart creation errors have been sought, but may not always  have been located. Such creation errors do not reflect on   the standard of medical care.

## 2021-04-28 ENCOUNTER — Ambulatory Visit (INDEPENDENT_AMBULATORY_CARE_PROVIDER_SITE_OTHER): Payer: 59 | Admitting: *Deleted

## 2021-04-28 DIAGNOSIS — J309 Allergic rhinitis, unspecified: Secondary | ICD-10-CM

## 2021-05-02 ENCOUNTER — Encounter: Payer: Self-pay | Admitting: Rheumatology

## 2021-05-02 ENCOUNTER — Other Ambulatory Visit: Payer: Self-pay

## 2021-05-02 ENCOUNTER — Ambulatory Visit (INDEPENDENT_AMBULATORY_CARE_PROVIDER_SITE_OTHER): Payer: 59 | Admitting: Rheumatology

## 2021-05-02 VITALS — BP 126/85 | HR 87 | Ht 63.0 in | Wt 178.6 lb

## 2021-05-02 DIAGNOSIS — R768 Other specified abnormal immunological findings in serum: Secondary | ICD-10-CM | POA: Diagnosis not present

## 2021-05-02 DIAGNOSIS — L2089 Other atopic dermatitis: Secondary | ICD-10-CM | POA: Diagnosis not present

## 2021-05-02 DIAGNOSIS — M5030 Other cervical disc degeneration, unspecified cervical region: Secondary | ICD-10-CM | POA: Diagnosis not present

## 2021-05-02 DIAGNOSIS — M19041 Primary osteoarthritis, right hand: Secondary | ICD-10-CM | POA: Diagnosis not present

## 2021-05-02 DIAGNOSIS — G894 Chronic pain syndrome: Secondary | ICD-10-CM | POA: Diagnosis not present

## 2021-05-02 DIAGNOSIS — Z8379 Family history of other diseases of the digestive system: Secondary | ICD-10-CM

## 2021-05-02 DIAGNOSIS — M19072 Primary osteoarthritis, left ankle and foot: Secondary | ICD-10-CM

## 2021-05-02 DIAGNOSIS — M19042 Primary osteoarthritis, left hand: Secondary | ICD-10-CM | POA: Diagnosis not present

## 2021-05-02 DIAGNOSIS — J3089 Other allergic rhinitis: Secondary | ICD-10-CM | POA: Diagnosis not present

## 2021-05-02 DIAGNOSIS — J4540 Moderate persistent asthma, uncomplicated: Secondary | ICD-10-CM

## 2021-05-02 DIAGNOSIS — M5134 Other intervertebral disc degeneration, thoracic region: Secondary | ICD-10-CM

## 2021-05-02 DIAGNOSIS — M19071 Primary osteoarthritis, right ankle and foot: Secondary | ICD-10-CM

## 2021-05-02 NOTE — Patient Instructions (Signed)
Hand Exercises Hand exercises can be helpful for almost anyone. These exercises can strengthen the hands, improve flexibility and movement, and increase blood flow to the hands. These results can make work and daily tasks easier. Hand exercises can be especially helpful for people who have joint pain from arthritis or have nerve damage from overuse (carpal tunnel syndrome). These exercises can also help people who have injured a hand. Exercises Most of these hand exercises are gentle stretching and motion exercises. It is usually safe to do them often throughout the day. Warming up your hands before exercise may help to reduce stiffness. You can do this with gentle massage or by placing your hands in warm water for 10-15 minutes. It is normal to feel some stretching, pulling, tightness, or mild discomfort as you begin new exercises. This will gradually improve. Stop an exercise right away if you feel sudden, severe pain or your pain gets worse. Ask your health care provider which exercises are best for you. Knuckle bend or "claw" fist  Stand or sit with your arm, hand, and all five fingers pointed straight up. Make sure to keep your wrist straight during the exercise. Gently bend your fingers down toward your palm until the tips of your fingers are touching the top of your palm. Keep your big knuckle straight and just bend the small knuckles in your fingers. Hold this position for __________ seconds. Straighten (extend) your fingers back to the starting position. Repeat this exercise 5-10 times with each hand. Full finger fist  Stand or sit with your arm, hand, and all five fingers pointed straight up. Make sure to keep your wrist straight during the exercise. Gently bend your fingers into your palm until the tips of your fingers are touching the middle of your palm. Hold this position for __________ seconds. Extend your fingers back to the starting position, stretching every joint fully. Repeat  this exercise 5-10 times with each hand. Straight fist Stand or sit with your arm, hand, and all five fingers pointed straight up. Make sure to keep your wrist straight during the exercise. Gently bend your fingers at the big knuckle, where your fingers meet your hand, and the middle knuckle. Keep the knuckle at the tips of your fingers straight and try to touch the bottom of your palm. Hold this position for __________ seconds. Extend your fingers back to the starting position, stretching every joint fully. Repeat this exercise 5-10 times with each hand. Tabletop  Stand or sit with your arm, hand, and all five fingers pointed straight up. Make sure to keep your wrist straight during the exercise. Gently bend your fingers at the big knuckle, where your fingers meet your hand, as far down as you can while keeping the small knuckles in your fingers straight. Think of forming a tabletop with your fingers. Hold this position for __________ seconds. Extend your fingers back to the starting position, stretching every joint fully. Repeat this exercise 5-10 times with each hand. Finger spread  Place your hand flat on a table with your palm facing down. Make sure your wrist stays straight as you do this exercise. Spread your fingers and thumb apart from each other as far as you can until you feel a gentle stretch. Hold this position for __________ seconds. Bring your fingers and thumb tight together again. Hold this position for __________ seconds. Repeat this exercise 5-10 times with each hand. Making circles  Stand or sit with your arm, hand, and all five fingers pointed   straight up. Make sure to keep your wrist straight during the exercise. Make a circle by touching the tip of your thumb to the tip of your index finger. Hold for __________ seconds. Then open your hand wide. Repeat this motion with your thumb and each finger on your hand. Repeat this exercise 5-10 times with each hand. Thumb  motion  Sit with your forearm resting on a table and your wrist straight. Your thumb should be facing up toward the ceiling. Keep your fingers relaxed as you move your thumb. Lift your thumb up as high as you can toward the ceiling. Hold for __________ seconds. Bend your thumb across your palm as far as you can, reaching the tip of your thumb for the small finger (pinkie) side of your palm. Hold for __________ seconds. Repeat this exercise 5-10 times with each hand. Grip strengthening  Hold a stress ball or other soft ball in the middle of your hand. Slowly increase the pressure, squeezing the ball as much as you can without causing pain. Think of bringing the tips of your fingers into the middle of your palm. All of your finger joints should bend when doing this exercise. Hold your squeeze for __________ seconds, then relax. Repeat this exercise 5-10 times with each hand. Contact a health care provider if: Your hand pain or discomfort gets much worse when you do an exercise. Your hand pain or discomfort does not improve within 2 hours after you exercise. If you have any of these problems, stop doing these exercises right away. Do not do them again unless your health care provider says that you can. Get help right away if: You develop sudden, severe hand pain or swelling. If this happens, stop doing these exercises right away. Do not do them again unless your health care provider says that you can. This information is not intended to replace advice given to you by your health care provider. Make sure you discuss any questions you have with your health care provider. Document Revised: 10/28/2020 Document Reviewed: 10/28/2020 Elsevier Patient Education  [place]. Back Exercises The following exercises strengthen the muscles that help to support the trunk (torso) and back. They also help to keep the lower back flexible. Doing these exercises can help to prevent or lessen existing low  back pain. If you have back pain or discomfort, try doing these exercises 2-3 times each day or as told by your health care provider. As your pain improves, do them once each day, but increase the number of times that you repeat the steps for each exercise (do more repetitions). To prevent the recurrence of back pain, continue to do these exercises once each day or as told by your health care provider. Do exercises exactly as told by your health care provider and adjust them as directed. It is normal to feel mild stretching, pulling, tightness, or discomfort as you do these exercises, but you should stop right away if you feel sudden pain or your pain gets worse. Exercises Single knee to chest Repeat these steps 3-5 times for each leg: Lie on your back on a firm bed or the floor with your legs extended. Bring one knee to your chest. Your other leg should stay extended and in contact with the floor. Hold your knee in place by grabbing your knee or thigh with both hands and hold. Pull on your knee until you feel a gentle stretch in your lower back or buttocks. Hold the stretch for 10-30 seconds.  Slowly release and straighten your leg. Pelvic tilt Repeat these steps 5-10 times: Lie on your back on a firm bed or the floor with your legs extended. Bend your knees so they are pointing toward the ceiling and your feet are flat on the floor. Tighten your lower abdominal muscles to press your lower back against the floor. This motion will tilt your pelvis so your tailbone points up toward the ceiling instead of pointing to your feet or the floor. With gentle tension and even breathing, hold this position for 5-10 seconds. Cat-cow Repeat these steps until your lower back becomes more flexible: Get into a hands-and-knees position on a firm bed or the floor. Keep your hands under your shoulders, and keep your knees under your hips. You may place padding under your knees for comfort. Let your head hang  down toward your chest. Contract your abdominal muscles and point your tailbone toward the floor so your lower back becomes rounded like the back of a cat. Hold this position for 5 seconds. Slowly lift your head, let your abdominal muscles relax, and point your tailbone up toward the ceiling so your back forms a sagging arch like the back of a cow. Hold this position for 5 seconds.  Press-ups Repeat these steps 5-10 times: Lie on your abdomen (face-down) on a firm bed or the floor. Place your palms near your head, about shoulder-width apart. Keeping your back as relaxed as possible and keeping your hips on the floor, slowly straighten your arms to raise the top half of your body and lift your shoulders. Do not use your back muscles to raise your upper torso. You may adjust the placement of your hands to make yourself more comfortable. Hold this position for 5 seconds while you keep your back relaxed. Slowly return to lying flat on the floor.  Bridges Repeat these steps 10 times: Lie on your back on a firm bed or the floor. Bend your knees so they are pointing toward the ceiling and your feet are flat on the floor. Your arms should be flat at your sides, next to your body. Tighten your buttocks muscles and lift your buttocks off the floor until your waist is at almost the same height as your knees. You should feel the muscles working in your buttocks and the back of your thighs. If you do not feel these muscles, slide your feet 1-2 inches (2.5-5 cm) farther away from your buttocks. Hold this position for 3-5 seconds. Slowly lower your hips to the starting position, and allow your buttocks muscles to relax completely. If this exercise is too easy, try doing it with your arms crossed over your chest. Abdominal crunches Repeat these steps 5-10 times: Lie on your back on a firm bed or the floor with your legs extended. Bend your knees so they are pointing toward the ceiling and your feet are flat  on the floor. Cross your arms over your chest. Tip your chin slightly toward your chest without bending your neck. Tighten your abdominal muscles and slowly raise your torso high enough to lift your shoulder blades a tiny bit off the floor. Avoid raising your torso higher than that because it can put too much stress on your lower back and does not help to strengthen your abdominal muscles. Slowly return to your starting position. Back lifts Repeat these steps 5-10 times: Lie on your abdomen (face-down) with your arms at your sides, and rest your forehead on the floor. Tighten the muscles in  your legs and your buttocks. Slowly lift your chest off the floor while you keep your hips pressed to the floor. Keep the back of your head in line with the curve in your back. Your eyes should be looking at the floor. Hold this position for 3-5 seconds. Slowly return to your starting position. Contact a health care provider if: Your back pain or discomfort gets much worse when you do an exercise. Your worsening back pain or discomfort does not lessen within 2 hours after you exercise. If you have any of these problems, stop doing these exercises right away. Do not do them again unless your health care provider says that you can. Get help right away if: You develop sudden, severe back pain. If this happens, stop doing the exercises right away. Do not do them again unless your health care provider says that you can. This information is not intended to replace advice given to you by your health care provider. Make sure you discuss any questions you have with your health care provider. Document Revised: 09/22/2020 Document Reviewed: 09/22/2020 Elsevier Patient Education  [place].

## 2021-05-05 ENCOUNTER — Ambulatory Visit (INDEPENDENT_AMBULATORY_CARE_PROVIDER_SITE_OTHER): Payer: 59 | Admitting: *Deleted

## 2021-05-05 DIAGNOSIS — J309 Allergic rhinitis, unspecified: Secondary | ICD-10-CM | POA: Diagnosis not present

## 2021-05-10 ENCOUNTER — Ambulatory Visit (INDEPENDENT_AMBULATORY_CARE_PROVIDER_SITE_OTHER): Payer: 59

## 2021-05-10 DIAGNOSIS — J309 Allergic rhinitis, unspecified: Secondary | ICD-10-CM | POA: Diagnosis not present

## 2021-05-11 DIAGNOSIS — Z8379 Family history of other diseases of the digestive system: Secondary | ICD-10-CM | POA: Diagnosis not present

## 2021-05-11 DIAGNOSIS — R69 Illness, unspecified: Secondary | ICD-10-CM | POA: Diagnosis not present

## 2021-05-11 DIAGNOSIS — M2550 Pain in unspecified joint: Secondary | ICD-10-CM | POA: Diagnosis not present

## 2021-05-16 DIAGNOSIS — R69 Illness, unspecified: Secondary | ICD-10-CM | POA: Diagnosis not present

## 2021-05-19 ENCOUNTER — Ambulatory Visit (INDEPENDENT_AMBULATORY_CARE_PROVIDER_SITE_OTHER): Payer: 59 | Admitting: *Deleted

## 2021-05-19 DIAGNOSIS — J309 Allergic rhinitis, unspecified: Secondary | ICD-10-CM | POA: Diagnosis not present

## 2021-05-26 ENCOUNTER — Ambulatory Visit (INDEPENDENT_AMBULATORY_CARE_PROVIDER_SITE_OTHER): Payer: 59 | Admitting: *Deleted

## 2021-05-26 DIAGNOSIS — J309 Allergic rhinitis, unspecified: Secondary | ICD-10-CM

## 2021-05-26 NOTE — Patient Instructions (Addendum)
Asthma Prednisone 10 mg taking 2 tablets twice a day for 3 days, then on the fourth day take 2 tablets in the morning, on the fifth day take 1 tablet and stop Daily controller medication(s): Breztri 2 puffs twice a day with spacer and rinse mouth afterwards..  May use albuterol rescue inhaler 2 puffs every 4 to 6 hours as needed for shortness of breath, chest tightness, coughing, and wheezing. May use albuterol rescue inhaler 2 puffs 5 to 15 minutes prior to strenuous physical activities. Monitor frequency of use.  Asthma control goals:  Full participation in all desired activities (may need albuterol before activity) Albuterol use two times or less a week on average (not counting use with activity) Cough interfering with sleep two times or less a month Oral steroids no more than once a year No hospitalizations  Perennial and seasonal allergic rhinitis Continue environmental control measures. Continue allergy injections per protocol and have access to epinephrine auto injector device Use over the counter antihistamines such as Zyrtec (cetirizine), Claritin (loratadine), Allegra (fexofenadine), or Xyzal (levocetirizine) daily as needed. May take twice a day during allergy flares. May switch antihistamines every few months. May use Flonase (fluticasone) OR Nasonex nasal spray 1 spray per nostril twice a day as needed for nasal congestion.  Nasal saline spray (i.e., Simply Saline) or nasal saline lavage (i.e., NeilMed) is recommended as needed and prior to medicated nasal sprays.  Atopic dermatitis Continue proper skin care.  Possible reflux Lets try a 6-week trial of omeprazole 20 mg once a day see if that helps.  If this helps we will try to decrease to an H2 blocker like famotidine  Follow up in 6 weeks or sooner if needed.   Reducing Pollen Exposure Pollen seasons: trees (spring), grass (summer) and ragweed/weeds (fall). Keep windows closed in your home and car to lower pollen exposure.   Install air conditioning in the bedroom and throughout the house if possible.  Avoid going out in dry windy days - especially early morning. Pollen counts are highest between 5 - 10 AM and on dry, hot and windy days.  Save outside activities for late afternoon or after a heavy rain, when pollen levels are lower.  Avoid mowing of grass if you have grass pollen allergy. Be aware that pollen can also be transported indoors on people and pets.  Dry your clothes in an automatic dryer rather than hanging them outside where they might collect pollen.  Rinse hair and eyes before bedtime. Pet Allergen Avoidance: Contrary to popular opinion, there are no "hypoallergenic" breeds of dogs or cats. That is because people are not allergic to an animal's hair, but to an allergen found in the animal's saliva, dander (dead skin flakes) or urine. Pet allergy symptoms typically occur within minutes. For some people, symptoms can build up and become most severe 8 to 12 hours after contact with the animal. People with severe allergies can experience reactions in public places if dander has been transported on the pet owners' clothing. Keeping an animal outdoors is only a partial solution, since homes with pets in the yard still have higher concentrations of animal allergens. Before getting a pet, ask your allergist to determine if you are allergic to animals. If your pet is already considered part of your family, try to minimize contact and keep the pet out of the bedroom and other rooms where you spend a great deal of time. As with dust mites, vacuum carpets often or replace carpet with a hardwood  floor, tile or linoleum. High-efficiency particulate air (HEPA) cleaners can reduce allergen levels over time. While dander and saliva are the source of cat and dog allergens, urine is the source of allergens from rabbits, hamsters, mice and [place] pigs; so ask a non-allergic family member to clean the animal's cage. If you  have a pet allergy, talk to your allergist about the potential for allergy immunotherapy (allergy shots). This strategy can often provide long-term relief. Control of House Dust Mite Allergen Dust mite allergens are a common trigger of allergy and asthma symptoms. While they can be found throughout the house, these microscopic creatures thrive in warm, humid environments such as bedding, upholstered furniture and carpeting. Because so much time is spent in the bedroom, it is essential to reduce mite levels there.  Encase pillows, mattresses, and box springs in special allergen-proof fabric covers or airtight, zippered plastic covers.  Bedding should be washed weekly in hot water (130 F) and dried in a hot dryer. Allergen-proof covers are available for comforters and pillows that can't be regularly washed.  Wash the allergy-proof covers every few months. Minimize clutter in the bedroom. Keep pets out of the bedroom.  Keep humidity less than 50% by using a dehumidifier or air conditioning. You can buy a humidity measuring device called a hygrometer to monitor this.  If possible, replace carpets with hardwood, linoleum, or washable area rugs. If that's not possible, vacuum frequently with a vacuum that has a HEPA filter. Remove all upholstered furniture and non-washable window drapes from the bedroom. Remove all non-washable stuffed toys from the bedroom.  Wash stuffed toys weekly. Mold Control Mold and fungi can grow on a variety of surfaces provided certain temperature and moisture conditions exist.  Outdoor molds grow on plants, decaying vegetation and soil. The major outdoor mold, Alternaria and Cladosporium, are found in very high numbers during hot and dry conditions. Generally, a late summer - fall peak is seen for common outdoor fungal spores. Rain will temporarily lower outdoor mold spore count, but counts rise rapidly when the rainy period ends. The most important indoor molds are Aspergillus  and Penicillium. Dark, humid and poorly ventilated basements are ideal sites for mold growth. The next most common sites of mold growth are the bathroom and the kitchen. Outdoor (Seasonal) Mold Control Use air conditioning and keep windows closed. Avoid exposure to decaying vegetation. Avoid leaf raking. Avoid grain handling. Consider wearing a face mask if working in moldy areas.  Indoor (Perennial) Mold Control  Maintain humidity below 50%. Get rid of mold growth on hard surfaces with water, detergent and, if necessary, 5% bleach (do not mix with other cleaners). Then dry the area completely. If mold covers an area more than 10 square feet, consider hiring an indoor environmental professional. For clothing, washing with soap and water is best. If moldy items cannot be cleaned and dried, throw them away. Remove sources e.g. contaminated carpets. Repair and seal leaking roofs or pipes. Using dehumidifiers in damp basements may be helpful, but empty the water and clean units regularly to prevent mildew from forming. All rooms, especially basements, bathrooms and kitchens, require ventilation and cleaning to deter mold and mildew growth. Avoid carpeting on concrete or damp floors, and storing items in damp areas.

## 2021-05-27 ENCOUNTER — Other Ambulatory Visit: Payer: Self-pay

## 2021-05-27 ENCOUNTER — Encounter: Payer: Self-pay | Admitting: Family

## 2021-05-27 ENCOUNTER — Ambulatory Visit: Payer: 59 | Admitting: Family

## 2021-05-27 VITALS — BP 122/78 | HR 64 | Temp 98.4°F | Resp 16 | Ht 63.0 in | Wt 180.6 lb

## 2021-05-27 DIAGNOSIS — J3089 Other allergic rhinitis: Secondary | ICD-10-CM | POA: Diagnosis not present

## 2021-05-27 DIAGNOSIS — J069 Acute upper respiratory infection, unspecified: Secondary | ICD-10-CM | POA: Diagnosis not present

## 2021-05-27 DIAGNOSIS — K219 Gastro-esophageal reflux disease without esophagitis: Secondary | ICD-10-CM | POA: Diagnosis not present

## 2021-05-27 DIAGNOSIS — J4541 Moderate persistent asthma with (acute) exacerbation: Secondary | ICD-10-CM | POA: Diagnosis not present

## 2021-05-27 MED ORDER — OMEPRAZOLE MAGNESIUM 20 MG PO TBEC: 20.0000 mg | DELAYED_RELEASE_TABLET | Freq: Every day | ORAL | 1 refills | Status: DC

## 2021-05-27 MED ORDER — ALBUTEROL SULFATE (2.5 MG/3ML) 0.083% IN NEBU: INHALATION_SOLUTION | RESPIRATORY_TRACT | 3 refills | Status: AC

## 2021-05-27 NOTE — Progress Notes (Signed)
[address] [place] [address] Dept: [phone]  FOLLOW UP NOTE  Patient ID: Anne Jefferson, female    DOB: 05/18/1976  Age: 45 y.o. MRN: [id number] Date of Office Visit: 05/27/2021  Assessment  Chief Complaint: Asthma  HPI Anne Jefferson is a 45 year old female who presents today for an acute visit.  She was last seen on December 23, 2020 by Dr. Maudie Mercury for moderate persistent asthma without complication, perennial and seasonal allergic rhinitis, and atopic dermatitis.  Moderate persistent asthma is reported as not well controlled with Breztri 2 puffs twice a day with a spacer and albuterol as needed.  She reports for the past 3 days she has had a nonproductive cough, but she can feel fluid in her lungs, wheezing, and shortness of breath.  She denies fever, chills, tightness in her chest, and nocturnal awakenings due to breathing problems.  Since her last office visit she has not required any systemic steroids or made any trips to the emergency room or urgent care due to breathing problems.  She has been using her albuterol inhaler approximately 6-8 times a day.  She reports that she does not feel sick, but her kids have had bad chest colds.  She mentions that she usually does well with prednisone when she has these symptoms.  Perennial and seasonal allergic rhinitis is reported as moderately controlled with allergy injections per protocol, Allegra twice a day, and a steroid nasal spray.  She reports that she bought a cat over a year ago which she is allergic to she knows that this may be causing some of her problems.  She reports possible postnasal drip and denies sore throat, rhinorrhea, and nasal congestion.  She has not had any sinus infections since we last saw her.  She feels like she cannot clear her throat, but at times she can.  She denies heartburn or reflux symptoms.  She feels like her allergy injections have helped a little.  She is still in the buildup  process though.  She mentions that she almost always has swelling and a hard itching knot at her injection site.  Instructed her to make sure that she lrts the injection room nurse know.  She reports that she does and at times the back her dose down.  She denies ever having a history of atopic dermatitis.   Drug Allergies:  Allergies  Allergen Reactions   Promethazine Other (See Comments)    Hallucinations   Theophyllines Nausea And Vomiting   Mite (D. Farinae) Other (See Comments)    Cats, dogs, dust     Review of Systems: Review of Systems  Constitutional:  Negative for chills and fever.  HENT:         Reports possible postnasal drip and denies rhinorrhea and nasal congestion  Eyes:        Denies itchy watery eyes  Respiratory:  Positive for cough, shortness of breath and wheezing.        Reports nonproductive cough, wheezing, and shortness of breath for the past 3 days.  Denies tightness in her chest, and nocturnal awakenings  Cardiovascular:  Positive for chest pain. Negative for palpitations.       Reports chest pain earlier a week from coughing but denies angina  Gastrointestinal:        Reports sensation of feeling something stuck in her throat that she can sometimes clear  Genitourinary:  Negative for frequency.  Skin:  Negative for itching and rash.  Neurological:  Positive  for headaches.       Reports that she gets migraines once a week and will take 4 ibuprofen or she will start vomiting  Endo/Heme/Allergies:  Positive for environmental allergies.    Physical Exam: BP 122/78 (BP Location: Left Arm, Patient Position: Sitting, Cuff Size: Normal)   Pulse 64   Temp 98.4 F (36.9 C) (Temporal)   Resp 16   Ht 5\' 3"  (1.6 m)   Wt 180 lb 9.6 oz (81.9 kg)   SpO2 97%   BMI 31.99 kg/m    Physical Exam Constitutional:      Appearance: Normal appearance.  HENT:     Head: Normocephalic and atraumatic.     Comments: Pharynx normal, eyes normal, ears normal, nose:  Bilateral lower turbinates mildly edematous and slightly erythematous with no drainage noted    Right Ear: Tympanic membrane, ear canal and external ear normal.     Left Ear: Tympanic membrane, ear canal and external ear normal.     Mouth/Throat:     Mouth: Mucous membranes are moist.     Pharynx: Oropharynx is clear.  Eyes:     Conjunctiva/sclera: Conjunctivae normal.  Cardiovascular:     Rate and Rhythm: Regular rhythm.     Heart sounds: Normal heart sounds.  Pulmonary:     Effort: Pulmonary effort is normal.     Breath sounds: Normal breath sounds.     Comments: Lungs clear to auscultation Musculoskeletal:     Cervical back: Neck supple.  Skin:    General: Skin is warm.  Neurological:     Mental Status: She is alert and oriented to person, place, and time.  Psychiatric:        Mood and Affect: Mood normal.        Behavior: Behavior normal.        Thought Content: Thought content normal.        Judgment: Judgment normal.    Diagnostics: FVC 2.01 L, FEV1 2.12 L.(78%)  Predicted FVC 3.39 L, predicted FEV1 2.73 L.  Spirometry indicates possible mild restriction.  Spirometry is consistent with previous spirometry.  Assessment and Plan: 1. Moderate persistent asthma with (acute) exacerbation   2. Perennial and seasonal allergic rhinitis   3. Gastroesophageal reflux disease, unspecified whether esophagitis present     No orders of the defined types were placed in this encounter.   Patient Instructions  Asthma Prednisone 10 mg taking 2 tablets twice a day for 3 days, then on the fourth day take 2 tablets in the morning, on the fifth day take 1 tablet and stop Daily controller medication(s): Breztri 2 puffs twice a day with spacer and rinse mouth afterwards..  May use albuterol rescue inhaler 2 puffs every 4 to 6 hours as needed for shortness of breath, chest tightness, coughing, and wheezing. May use albuterol rescue inhaler 2 puffs 5 to 15 minutes prior to strenuous physical  activities. Monitor frequency of use.  Asthma control goals:  Full participation in all desired activities (may need albuterol before activity) Albuterol use two times or less a week on average (not counting use with activity) Cough interfering with sleep two times or less a month Oral steroids no more than once a year No hospitalizations  Perennial and seasonal allergic rhinitis Continue environmental control measures. Continue allergy injections per protocol and have access to epinephrine auto injector device Use over the counter antihistamines such as Zyrtec (cetirizine), Claritin (loratadine), Allegra (fexofenadine), or Xyzal (levocetirizine) daily as needed. May take twice a  day during allergy flares. May switch antihistamines every few months. May use Flonase (fluticasone) OR Nasonex nasal spray 1 spray per nostril twice a day as needed for nasal congestion.  Nasal saline spray (i.e., Simply Saline) or nasal saline lavage (i.e., NeilMed) is recommended as needed and prior to medicated nasal sprays.  Atopic dermatitis Continue proper skin care.  Possible reflux Lets try a 6-week trial of omeprazole 20 mg once a day see if that helps.  If this helps we will try to decrease to an H2 blocker like famotidine  Follow up in 6 weeks or sooner if needed.   Reducing Pollen Exposure Pollen seasons: trees (spring), grass (summer) and ragweed/weeds (fall). Keep windows closed in your home and car to lower pollen exposure.  Install air conditioning in the bedroom and throughout the house if possible.  Avoid going out in dry windy days - especially early morning. Pollen counts are highest between 5 - 10 AM and on dry, hot and windy days.  Save outside activities for late afternoon or after a heavy rain, when pollen levels are lower.  Avoid mowing of grass if you have grass pollen allergy. Be aware that pollen can also be transported indoors on people and pets.  Dry your clothes in an automatic  dryer rather than hanging them outside where they might collect pollen.  Rinse hair and eyes before bedtime. Pet Allergen Avoidance: Contrary to popular opinion, there are no "hypoallergenic" breeds of dogs or cats. That is because people are not allergic to an animal's hair, but to an allergen found in the animal's saliva, dander (dead skin flakes) or urine. Pet allergy symptoms typically occur within minutes. For some people, symptoms can build up and become most severe 8 to 12 hours after contact with the animal. People with severe allergies can experience reactions in public places if dander has been transported on the pet owners' clothing. Keeping an animal outdoors is only a partial solution, since homes with pets in the yard still have higher concentrations of animal allergens. Before getting a pet, ask your allergist to determine if you are allergic to animals. If your pet is already considered part of your family, try to minimize contact and keep the pet out of the bedroom and other rooms where you spend a great deal of time. As with dust mites, vacuum carpets often or replace carpet with a hardwood floor, tile or linoleum. High-efficiency particulate air (HEPA) cleaners can reduce allergen levels over time. While dander and saliva are the source of cat and dog allergens, urine is the source of allergens from rabbits, hamsters, mice and [place] pigs; so ask a non-allergic family member to clean the animal's cage. If you have a pet allergy, talk to your allergist about the potential for allergy immunotherapy (allergy shots). This strategy can often provide long-term relief. Control of House Dust Mite Allergen Dust mite allergens are a common trigger of allergy and asthma symptoms. While they can be found throughout the house, these microscopic creatures thrive in warm, humid environments such as bedding, upholstered furniture and carpeting. Because so much time is spent in the bedroom, it is  essential to reduce mite levels there.  Encase pillows, mattresses, and box springs in special allergen-proof fabric covers or airtight, zippered plastic covers.  Bedding should be washed weekly in hot water (130 F) and dried in a hot dryer. Allergen-proof covers are available for comforters and pillows that can't be regularly washed.  Wash the allergy-proof covers every few  months. Minimize clutter in the bedroom. Keep pets out of the bedroom.  Keep humidity less than 50% by using a dehumidifier or air conditioning. You can buy a humidity measuring device called a hygrometer to monitor this.  If possible, replace carpets with hardwood, linoleum, or washable area rugs. If that's not possible, vacuum frequently with a vacuum that has a HEPA filter. Remove all upholstered furniture and non-washable window drapes from the bedroom. Remove all non-washable stuffed toys from the bedroom.  Wash stuffed toys weekly. Mold Control Mold and fungi can grow on a variety of surfaces provided certain temperature and moisture conditions exist.  Outdoor molds grow on plants, decaying vegetation and soil. The major outdoor mold, Alternaria and Cladosporium, are found in very high numbers during hot and dry conditions. Generally, a late summer - fall peak is seen for common outdoor fungal spores. Rain will temporarily lower outdoor mold spore count, but counts rise rapidly when the rainy period ends. The most important indoor molds are Aspergillus and Penicillium. Dark, humid and poorly ventilated basements are ideal sites for mold growth. The next most common sites of mold growth are the bathroom and the kitchen. Outdoor (Seasonal) Mold Control Use air conditioning and keep windows closed. Avoid exposure to decaying vegetation. Avoid leaf raking. Avoid grain handling. Consider wearing a face mask if working in moldy areas.  Indoor (Perennial) Mold Control  Maintain humidity below 50%. Get rid of mold growth on  hard surfaces with water, detergent and, if necessary, 5% bleach (do not mix with other cleaners). Then dry the area completely. If mold covers an area more than 10 square feet, consider hiring an indoor environmental professional. For clothing, washing with soap and water is best. If moldy items cannot be cleaned and dried, throw them away. Remove sources e.g. contaminated carpets. Repair and seal leaking roofs or pipes. Using dehumidifiers in damp basements may be helpful, but empty the water and clean units regularly to prevent mildew from forming. All rooms, especially basements, bathrooms and kitchens, require ventilation and cleaning to deter mold and mildew growth. Avoid carpeting on concrete or damp floors, and storing items in damp areas.   Return in about 6 weeks (around 07/08/2021), or if symptoms worsen or fail to improve.    Thank you for the opportunity to care for this patient.  Please do not hesitate to contact me with questions.  Althea Charon, FNP [facility]

## 2021-06-02 ENCOUNTER — Ambulatory Visit (INDEPENDENT_AMBULATORY_CARE_PROVIDER_SITE_OTHER): Payer: 59

## 2021-06-02 DIAGNOSIS — J309 Allergic rhinitis, unspecified: Secondary | ICD-10-CM | POA: Diagnosis not present

## 2021-06-09 ENCOUNTER — Ambulatory Visit (INDEPENDENT_AMBULATORY_CARE_PROVIDER_SITE_OTHER): Payer: 59 | Admitting: *Deleted

## 2021-06-09 DIAGNOSIS — J309 Allergic rhinitis, unspecified: Secondary | ICD-10-CM

## 2021-06-14 ENCOUNTER — Ambulatory Visit (INDEPENDENT_AMBULATORY_CARE_PROVIDER_SITE_OTHER): Payer: 59

## 2021-06-14 DIAGNOSIS — J309 Allergic rhinitis, unspecified: Secondary | ICD-10-CM

## 2021-06-21 ENCOUNTER — Ambulatory Visit (INDEPENDENT_AMBULATORY_CARE_PROVIDER_SITE_OTHER): Payer: 59 | Admitting: *Deleted

## 2021-06-21 DIAGNOSIS — J309 Allergic rhinitis, unspecified: Secondary | ICD-10-CM | POA: Diagnosis not present

## 2021-06-22 DIAGNOSIS — J3089 Other allergic rhinitis: Secondary | ICD-10-CM | POA: Diagnosis not present

## 2021-06-22 NOTE — Progress Notes (Signed)
VIALS MADE. EXP 06-22-22

## 2021-06-30 ENCOUNTER — Ambulatory Visit (INDEPENDENT_AMBULATORY_CARE_PROVIDER_SITE_OTHER): Payer: 59

## 2021-06-30 DIAGNOSIS — J309 Allergic rhinitis, unspecified: Secondary | ICD-10-CM

## 2021-07-05 ENCOUNTER — Ambulatory Visit (INDEPENDENT_AMBULATORY_CARE_PROVIDER_SITE_OTHER): Payer: 59 | Admitting: *Deleted

## 2021-07-05 DIAGNOSIS — J309 Allergic rhinitis, unspecified: Secondary | ICD-10-CM

## 2021-07-21 ENCOUNTER — Ambulatory Visit (INDEPENDENT_AMBULATORY_CARE_PROVIDER_SITE_OTHER): Payer: 59 | Admitting: *Deleted

## 2021-07-21 DIAGNOSIS — J309 Allergic rhinitis, unspecified: Secondary | ICD-10-CM

## 2021-07-26 ENCOUNTER — Ambulatory Visit (INDEPENDENT_AMBULATORY_CARE_PROVIDER_SITE_OTHER): Payer: 59 | Admitting: *Deleted

## 2021-07-26 DIAGNOSIS — J309 Allergic rhinitis, unspecified: Secondary | ICD-10-CM

## 2021-08-01 DIAGNOSIS — I776 Arteritis, unspecified: Secondary | ICD-10-CM | POA: Diagnosis not present

## 2021-08-01 DIAGNOSIS — M79641 Pain in right hand: Secondary | ICD-10-CM | POA: Diagnosis not present

## 2021-08-01 DIAGNOSIS — M792 Neuralgia and neuritis, unspecified: Secondary | ICD-10-CM | POA: Diagnosis not present

## 2021-08-01 DIAGNOSIS — M1990 Unspecified osteoarthritis, unspecified site: Secondary | ICD-10-CM | POA: Diagnosis not present

## 2021-08-01 DIAGNOSIS — M2550 Pain in unspecified joint: Secondary | ICD-10-CM | POA: Diagnosis not present

## 2021-08-01 DIAGNOSIS — M79606 Pain in leg, unspecified: Secondary | ICD-10-CM | POA: Diagnosis not present

## 2021-08-01 DIAGNOSIS — M542 Cervicalgia: Secondary | ICD-10-CM | POA: Diagnosis not present

## 2021-08-01 DIAGNOSIS — M79642 Pain in left hand: Secondary | ICD-10-CM | POA: Diagnosis not present

## 2021-08-04 ENCOUNTER — Ambulatory Visit (INDEPENDENT_AMBULATORY_CARE_PROVIDER_SITE_OTHER): Payer: 59

## 2021-08-04 DIAGNOSIS — N760 Acute vaginitis: Secondary | ICD-10-CM | POA: Diagnosis not present

## 2021-08-04 DIAGNOSIS — J309 Allergic rhinitis, unspecified: Secondary | ICD-10-CM | POA: Diagnosis not present

## 2021-08-04 DIAGNOSIS — R300 Dysuria: Secondary | ICD-10-CM | POA: Diagnosis not present

## 2021-08-04 DIAGNOSIS — N390 Urinary tract infection, site not specified: Secondary | ICD-10-CM | POA: Diagnosis not present

## 2021-08-08 DIAGNOSIS — Z888 Allergy status to other drugs, medicaments and biological substances status: Secondary | ICD-10-CM | POA: Diagnosis not present

## 2021-08-08 DIAGNOSIS — J45909 Unspecified asthma, uncomplicated: Secondary | ICD-10-CM | POA: Diagnosis not present

## 2021-08-08 DIAGNOSIS — M4802 Spinal stenosis, cervical region: Secondary | ICD-10-CM | POA: Diagnosis not present

## 2021-08-08 DIAGNOSIS — M47812 Spondylosis without myelopathy or radiculopathy, cervical region: Secondary | ICD-10-CM | POA: Diagnosis not present

## 2021-08-08 DIAGNOSIS — M4804 Spinal stenosis, thoracic region: Secondary | ICD-10-CM | POA: Diagnosis not present

## 2021-08-08 DIAGNOSIS — M542 Cervicalgia: Secondary | ICD-10-CM | POA: Diagnosis not present

## 2021-08-09 ENCOUNTER — Other Ambulatory Visit: Payer: Self-pay | Admitting: Internal Medicine

## 2021-08-09 DIAGNOSIS — M5412 Radiculopathy, cervical region: Secondary | ICD-10-CM

## 2021-08-09 DIAGNOSIS — M546 Pain in thoracic spine: Secondary | ICD-10-CM

## 2021-08-11 ENCOUNTER — Ambulatory Visit (INDEPENDENT_AMBULATORY_CARE_PROVIDER_SITE_OTHER): Payer: 59

## 2021-08-11 DIAGNOSIS — J309 Allergic rhinitis, unspecified: Secondary | ICD-10-CM | POA: Diagnosis not present

## 2021-08-16 ENCOUNTER — Other Ambulatory Visit: Payer: Self-pay | Admitting: Internal Medicine

## 2021-08-16 ENCOUNTER — Ambulatory Visit
Admission: RE | Admit: 2021-08-16 | Discharge: 2021-08-16 | Disposition: A | Discharge status: Home / Self Care | Payer: 59 | Source: Ambulatory Visit | Attending: Internal Medicine | Admitting: Internal Medicine

## 2021-08-16 DIAGNOSIS — N390 Urinary tract infection, site not specified: Secondary | ICD-10-CM

## 2021-08-16 DIAGNOSIS — R000 Tachycardia, unspecified: Secondary | ICD-10-CM | POA: Diagnosis not present

## 2021-08-16 DIAGNOSIS — N12 Tubulo-interstitial nephritis, not specified as acute or chronic: Secondary | ICD-10-CM | POA: Diagnosis not present

## 2021-08-16 IMAGING — US US RENAL
1 series · 14 of 25 positions shown · non-contrast
Comparison: None.

CLINICAL DATA: Urinary tract infection without hematuria, site
unspecified.

EXAM:
RENAL / URINARY TRACT ULTRASOUND COMPLETE

[Series 1: us renal · 0.16mm/px · 14 of 38 slices shown]
[im 1/38]
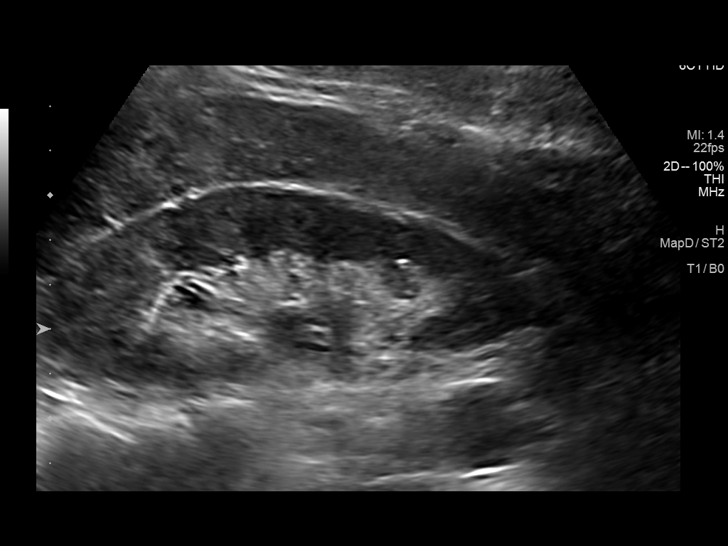
[im 4/38]
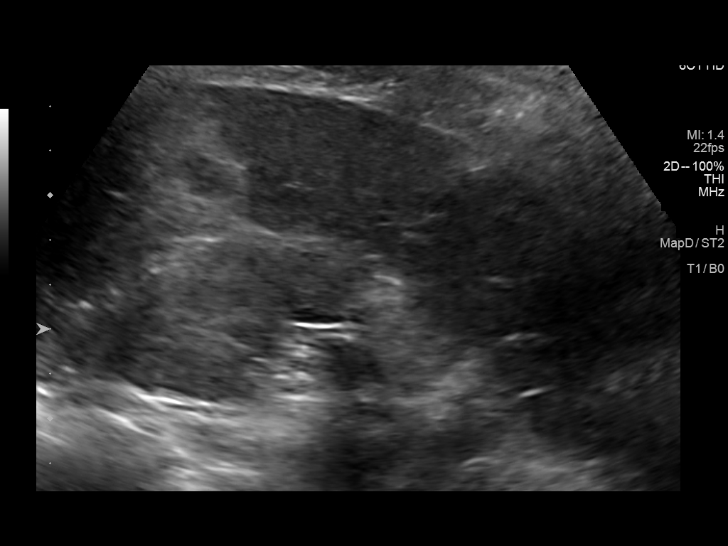
[im 7/38]
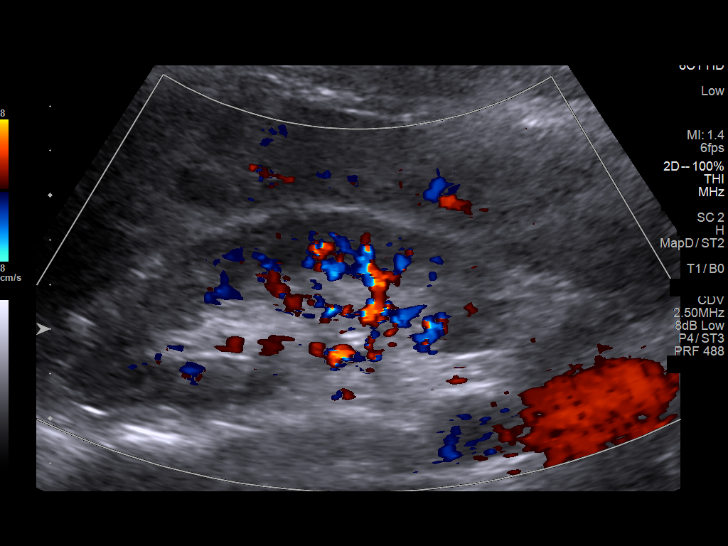
[im 10/38]
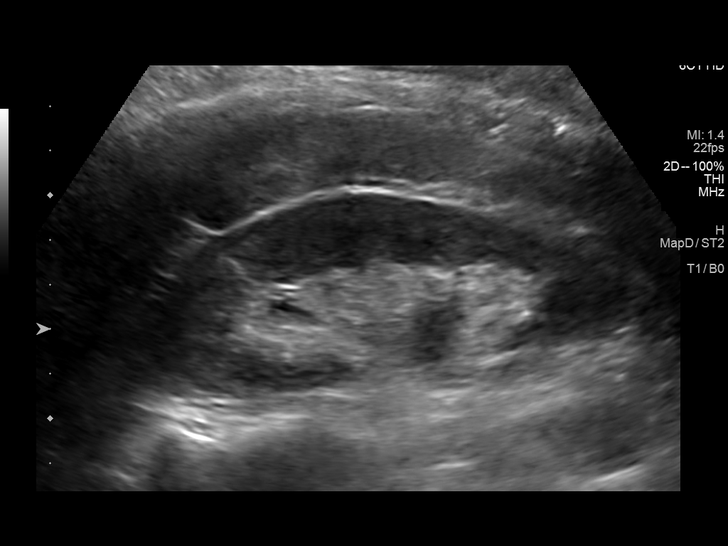
[im 13/38]
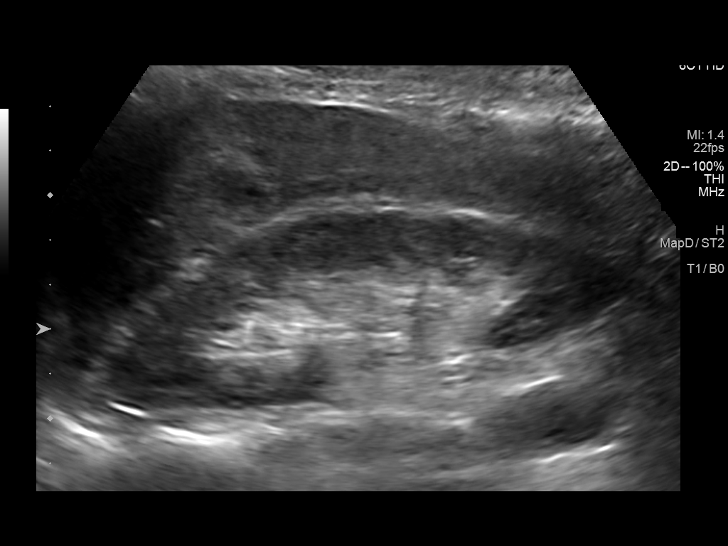
[im 14/38]
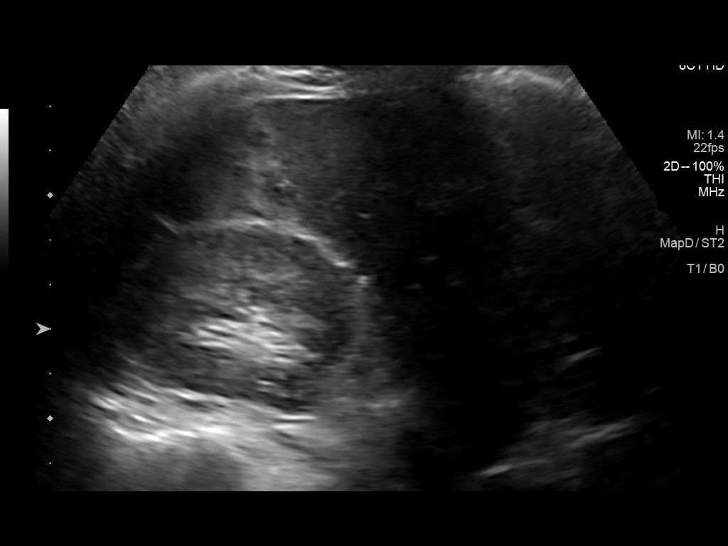
[im 17/38]
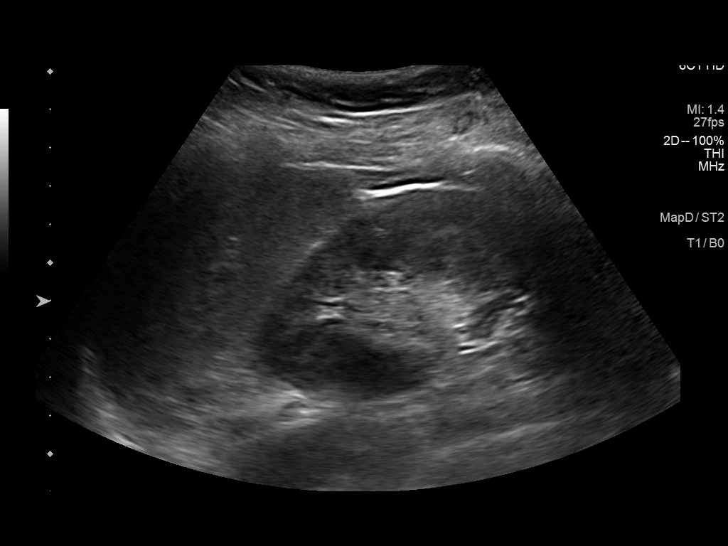
[im 21/38]
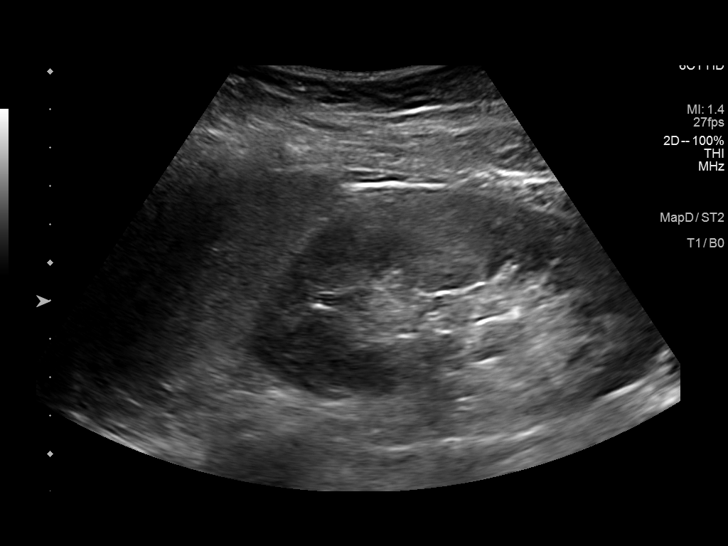
[im 24/38]
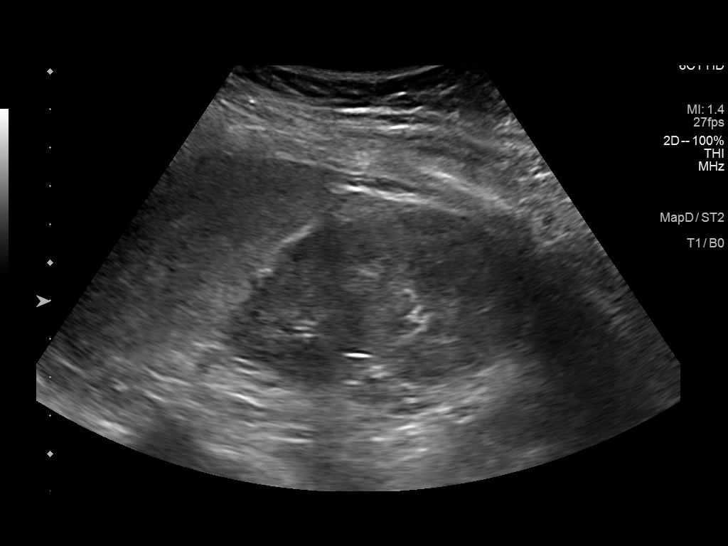
[im 25/38]
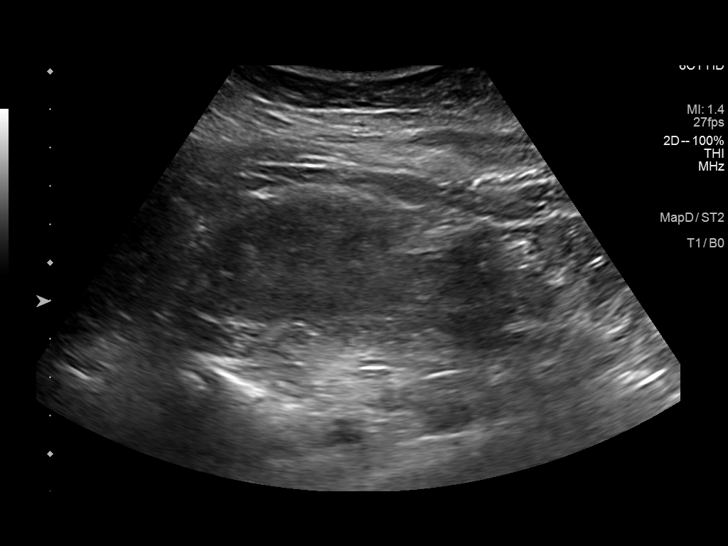
[im 28/38]
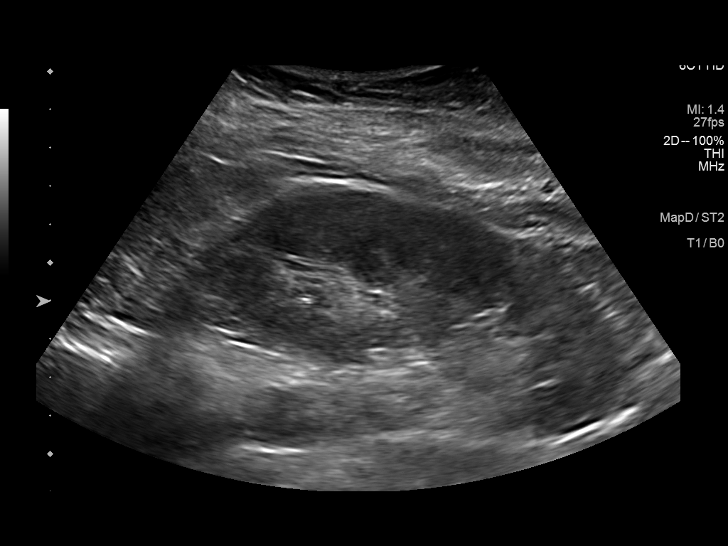
[im 31/38]
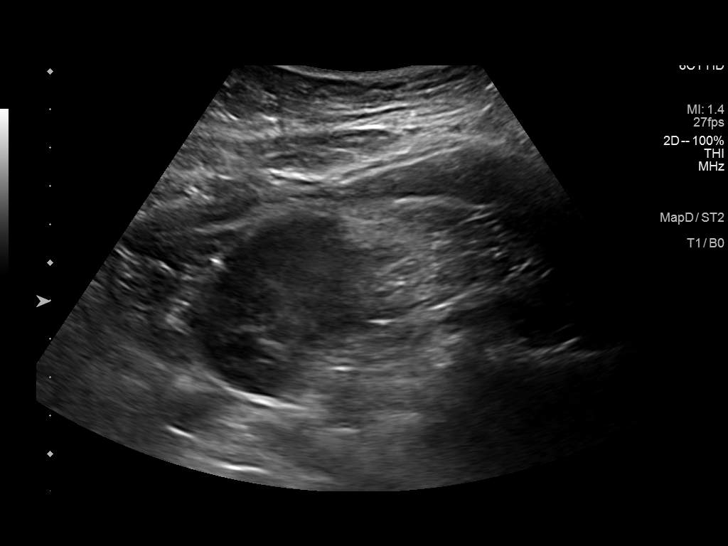
[im 34/38]
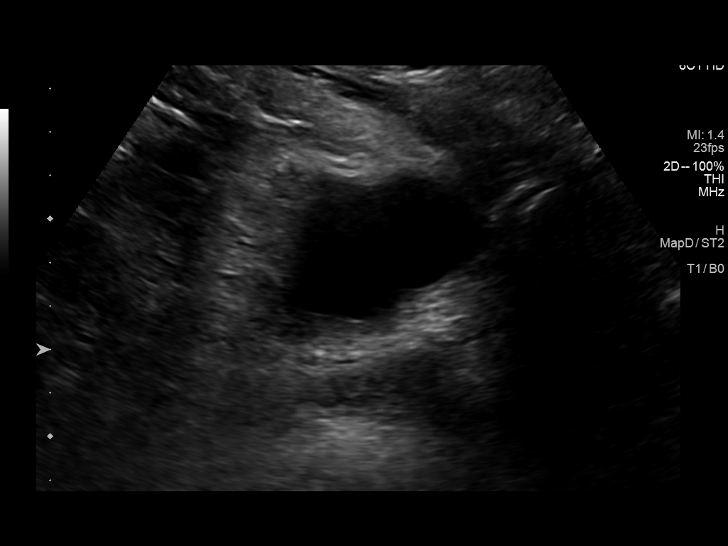
[im 38/38]
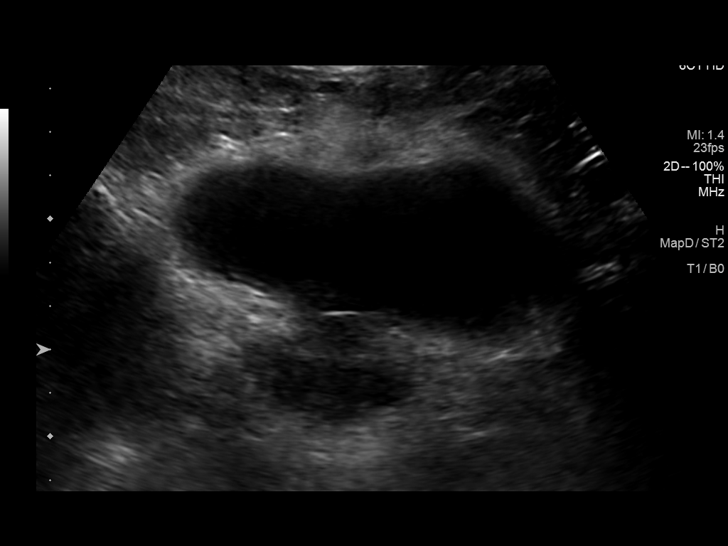

[14 of 25 positions shown; findings below may reference images not displayed]

FINDINGS: Right Kidney:

Renal measurements: 11.1 x 4.1 x 4.2 cm = volume: 100 mL.
Echogenicity within normal limits. No mass or hydronephrosis
visualized.

Left Kidney:

Renal measurements: 11.1 x 5.5 x 4.6 cm = volume: 145 mL.
Echogenicity within normal limits. No mass or hydronephrosis
visualized.

Bladder:

Appears normal for degree of bladder distention.

Other:

None.
IMPRESSION: Normal renal ultrasound.

## 2021-08-17 DIAGNOSIS — M79602 Pain in left arm: Secondary | ICD-10-CM | POA: Diagnosis not present

## 2021-08-17 DIAGNOSIS — M542 Cervicalgia: Secondary | ICD-10-CM | POA: Diagnosis not present

## 2021-08-17 DIAGNOSIS — M79601 Pain in right arm: Secondary | ICD-10-CM | POA: Diagnosis not present

## 2021-08-17 DIAGNOSIS — R202 Paresthesia of skin: Secondary | ICD-10-CM | POA: Diagnosis not present

## 2021-08-17 DIAGNOSIS — M47812 Spondylosis without myelopathy or radiculopathy, cervical region: Secondary | ICD-10-CM | POA: Diagnosis not present

## 2021-08-18 ENCOUNTER — Ambulatory Visit (INDEPENDENT_AMBULATORY_CARE_PROVIDER_SITE_OTHER): Payer: 59

## 2021-08-18 DIAGNOSIS — J309 Allergic rhinitis, unspecified: Secondary | ICD-10-CM

## 2021-08-25 ENCOUNTER — Ambulatory Visit (INDEPENDENT_AMBULATORY_CARE_PROVIDER_SITE_OTHER): Payer: 59

## 2021-08-25 DIAGNOSIS — J309 Allergic rhinitis, unspecified: Secondary | ICD-10-CM | POA: Diagnosis not present

## 2021-08-25 DIAGNOSIS — Z5181 Encounter for therapeutic drug level monitoring: Secondary | ICD-10-CM | POA: Diagnosis not present

## 2021-08-30 ENCOUNTER — Ambulatory Visit (INDEPENDENT_AMBULATORY_CARE_PROVIDER_SITE_OTHER): Payer: 59 | Admitting: *Deleted

## 2021-08-30 DIAGNOSIS — J309 Allergic rhinitis, unspecified: Secondary | ICD-10-CM

## 2021-09-08 ENCOUNTER — Ambulatory Visit (INDEPENDENT_AMBULATORY_CARE_PROVIDER_SITE_OTHER): Payer: 59

## 2021-09-08 DIAGNOSIS — J309 Allergic rhinitis, unspecified: Secondary | ICD-10-CM

## 2021-09-13 ENCOUNTER — Ambulatory Visit (INDEPENDENT_AMBULATORY_CARE_PROVIDER_SITE_OTHER): Payer: 59

## 2021-09-13 DIAGNOSIS — J309 Allergic rhinitis, unspecified: Secondary | ICD-10-CM | POA: Diagnosis not present

## 2021-09-16 DIAGNOSIS — R300 Dysuria: Secondary | ICD-10-CM | POA: Diagnosis not present

## 2021-09-16 DIAGNOSIS — N760 Acute vaginitis: Secondary | ICD-10-CM | POA: Diagnosis not present

## 2021-09-16 DIAGNOSIS — M2550 Pain in unspecified joint: Secondary | ICD-10-CM | POA: Diagnosis not present

## 2021-09-16 DIAGNOSIS — B3731 Acute candidiasis of vulva and vagina: Secondary | ICD-10-CM | POA: Diagnosis not present

## 2021-09-20 DIAGNOSIS — R69 Illness, unspecified: Secondary | ICD-10-CM | POA: Diagnosis not present

## 2021-09-20 DIAGNOSIS — Z630 Problems in relationship with spouse or partner: Secondary | ICD-10-CM | POA: Diagnosis not present

## 2021-09-22 ENCOUNTER — Ambulatory Visit (INDEPENDENT_AMBULATORY_CARE_PROVIDER_SITE_OTHER): Payer: 59

## 2021-09-22 DIAGNOSIS — J309 Allergic rhinitis, unspecified: Secondary | ICD-10-CM

## 2021-09-27 ENCOUNTER — Ambulatory Visit (INDEPENDENT_AMBULATORY_CARE_PROVIDER_SITE_OTHER): Payer: 59 | Admitting: *Deleted

## 2021-09-27 ENCOUNTER — Ambulatory Visit: Payer: Self-pay

## 2021-09-27 DIAGNOSIS — J309 Allergic rhinitis, unspecified: Secondary | ICD-10-CM

## 2021-09-27 NOTE — Progress Notes (Signed)
VIALS EXP 09-27-21 ?

## 2021-09-28 DIAGNOSIS — J3089 Other allergic rhinitis: Secondary | ICD-10-CM | POA: Diagnosis not present

## 2021-10-04 ENCOUNTER — Ambulatory Visit (INDEPENDENT_AMBULATORY_CARE_PROVIDER_SITE_OTHER): Payer: 59 | Admitting: *Deleted

## 2021-10-04 DIAGNOSIS — J309 Allergic rhinitis, unspecified: Secondary | ICD-10-CM

## 2021-10-13 ENCOUNTER — Ambulatory Visit (INDEPENDENT_AMBULATORY_CARE_PROVIDER_SITE_OTHER): Payer: 59

## 2021-10-13 DIAGNOSIS — J309 Allergic rhinitis, unspecified: Secondary | ICD-10-CM

## 2021-10-17 DIAGNOSIS — Z20822 Contact with and (suspected) exposure to covid-19: Secondary | ICD-10-CM | POA: Diagnosis not present

## 2021-10-19 NOTE — Progress Notes (Signed)
? ?[facility] ?[address] [place] [address] ?Dept: [phone] ? ?FOLLOW UP NOTE ? ?Patient ID: Anne Jefferson, female    DOB: 06-Nov-1975  Age: 46 y.o. MRN: [id number] ?Date of Office Visit: 10/20/2021 ? ?Assessment  ?Chief Complaint: Allergic Rhinitis  ? ?HPI ?Anne Jefferson is a 46 year old female who presents to the clinic for evaluation of asthma and cough. She was last seen on 05/27/2021 by Althea Charon, FNP for evaluation of asthma and allergic rhinitis on allergen immunotherapy.  In the interim, she reports that on Sunday she began to experience low-grade fever of about 101 degrees and sore throat.  She contacted her primary care provider 2 days ago and began prednisone 30 mg once a day and is currently on day 3 of a 5 day burst. At today's visit, she reports that she continues to experience symptoms of asthma exacerbation including shortness of breath with rest and activity, wheeze in the evening, and dry cough for the last 5 days.  She continues Breztri 2 puffs twice a day with a spacer and has been using albuterol via inhaler 6-7 times a day in addition to albuterol via nebulizer about twice a day especially overnight. She reports that prednisone is helping to relieve her asthma symptoms. Allergic rhinitis is reported as poorly controlled with symptoms including nasal congestion, sneezing, and postnasal drainage with frequent throat clearing. She reports headache, frontal sinus pain, and pressure occurring for the last 5 days.  She continues Nasonex 1 spray in each nostril twice a day and frequent nasal rinses.  She does report low-grade temperature for the last 5 days, intermittent sweats, and occasional body aches.  She reports negative COVID test x 2 and a negative influenza test.  She reports bilateral polypectomy in 2013. She denies symptoms of anosmia and ageusia.  She denies symptoms of reflux including heartburn or vomiting and is not currently taking a medication to control reflux.   Her current medications are listed in the chart. ?Of note, chart review indicates absolute eosinophil count of 0.2 on 08/31/2020.  ? ?Drug Allergies:  ?Allergies  ?Allergen Reactions  ? Promethazine Other (See Comments)  ?  Hallucinations  ? Theophyllines Nausea And Vomiting  ? Mite (D. Farinae) Other (See Comments)  ?  Cats, dogs, dust   ? ? ?Physical Exam: ?BP 126/82   Pulse 84   Temp 98.2 ?F (36.8 ?C)   Resp 16   Ht '5\' 3"'$  (1.6 m)   Wt 170 lb (77.1 kg)   SpO2 97%   BMI 30.11 kg/m?   ? ?Physical Exam ?Vitals reviewed.  ?Constitutional:   ?   Appearance: Normal appearance.  ?HENT:  ?   Head: Normocephalic and atraumatic.  ?   Right Ear: Tympanic membrane normal.  ?   Left Ear: Tympanic membrane normal.  ?   Nose:  ?   Comments: Bilateral nares slightly erythematous with clear nasal drainage noted.  Pharynx is slightly erythematous.  Ears normal.  Eyes normal. ?Eyes:  ?   Conjunctiva/sclera: Conjunctivae normal.  ?Cardiovascular:  ?   Rate and Rhythm: Normal rate and regular rhythm.  ?   Heart sounds: Normal heart sounds. No murmur heard. ?Pulmonary:  ?   Effort: Pulmonary effort is normal.  ?   Breath sounds: Normal breath sounds.  ?   Comments: Lungs clear to auscultation ?Musculoskeletal:     ?   General: Normal range of motion.  ?   Cervical back: Normal range of motion and neck  supple.  ?Skin: ?   General: Skin is warm and dry.  ?Neurological:  ?   Mental Status: She is alert and oriented to person, place, and time.  ?Psychiatric:     ?   Mood and Affect: Mood normal.     ?   Behavior: Behavior normal.     ?   Thought Content: Thought content normal.     ?   Judgment: Judgment normal.  ? ? ?Diagnostics: ?Deferred due to recent reported fever ? ?Assessment and Plan: ?1. Moderate persistent asthma with (acute) exacerbation   ?2. Acute cough   ?3. Acute non-recurrent frontal sinusitis   ?4. Perennial and seasonal allergic rhinitis   ?5. Viral illness   ? ? ?Meds ordered this encounter  ?Medications  ?  albuterol (PROVENTIL HFA) 108 (90 Base) MCG/ACT inhaler  ?  Sig: Inhale 2 puffs into the lungs every 4 (four) hours as needed for wheezing or shortness of breath.  ?  Dispense:  18 g  ?  Refill:  1  ? BREZTRI AEROSPHERE 160-9-4.8 MCG/ACT AERO  ?  Sig: Inhale 2 puffs into the lungs in the morning and at bedtime.  ?  Dispense:  10.7 g  ?  Refill:  5  ? amoxicillin-clavulanate (AUGMENTIN) 875-125 MG tablet  ?  Sig: Take 1 tablet by mouth 2 (two) times daily for 10 days.  ?  Dispense:  20 tablet  ?  Refill:  0  ? ? ?Patient Instructions  ?Asthma ?Continue prednisone 30 mg once a day for the remainder of the prescription you received from your primary care provider ?Continue Breztri 2 puffs twice a day with a spacer to prevent cough or wheeze ?Continue albuterol 2 puffs once every 4 hours as needed for cough or wheeze ?You may use albuterol 2 puffs 5 to 15 minutes before activity to decrease cough or wheeze ?Consider a biologic medication for better asthma control. Dupixent brochure given ? ?Cough ?Call the clinic with no improvement or worsening symptoms and we will move forward with chest xray ? ?Acute bacterial sinusitis ?Begin Augmentin 875 twice a day for the next 10 days. ?Continue nasal saline rinses and Flonase ?Begin Mucinex [phone] mg twice a day for the next week and increase hydration to thin out mucus ? ?Allergic rhinitis ?Continue allergen avoidance measures directed toward grass pollen, dust mite, cat, dog, and mold as listed below ?Continue allergen immunotherapy once a week (when you have finished your antibiotic and are feeling better) and have access to an epinephrine autoinjector set ?Consider an antihistamine once a day as needed for runny nose or itch. Remember to rotate to a different antihistamine about every 3 months. Some examples of over the counter antihistamines include Zyrtec (cetirizine), Xyzal (levocetirizine), Allegra (fexofenadine), and Claritin (loratidine).  ?Continue Flonase 2 sprays  in each nostril once a day as needed for stuffy nose ?Consider saline nasal rinses as needed for nasal symptoms. Use this before any medicated nasal sprays for best result ? ?Call the clinic if this treatment plan is not working well for you ? ?Follow up in 1 month or sooner if needed. ? ? ?Return in about 4 weeks (around 11/17/2021), or if symptoms worsen or fail to improve. ?  ? ?Thank you for the opportunity to care for this patient.  Please do not hesitate to contact me with questions. ? ?Gareth Morgan, FNP ?[facility] ? ? ? ? ? ?

## 2021-10-19 NOTE — Patient Instructions (Addendum)
Asthma ?Continue prednisone 30 mg once a day for the remainder of the prescription you received from your primary care provider ?Continue Breztri 2 puffs twice a day with a spacer to prevent cough or wheeze ?Continue albuterol 2 puffs once every 4 hours as needed for cough or wheeze ?You may use albuterol 2 puffs 5 to 15 minutes before activity to decrease cough or wheeze ?Consider a biologic medication for better asthma control. Dupixent brochure given ? ?Cough ?Call the clinic with no improvement or worsening symptoms and we will move forward with chest xray ? ?Acute bacterial sinusitis ?Begin Augmentin 875 twice a day for the next 10 days. ?Continue nasal saline rinses and Flonase ?Begin Mucinex [phone] mg twice a day for the next week and increase hydration to thin out mucus ? ?Allergic rhinitis ?Continue allergen avoidance measures directed toward grass pollen, dust mite, cat, dog, and mold as listed below ?Continue allergen immunotherapy once a week (when you have finished your antibiotic and are feeling better) and have access to an epinephrine autoinjector set ?Consider an antihistamine once a day as needed for runny nose or itch. Remember to rotate to a different antihistamine about every 3 months. Some examples of over the counter antihistamines include Zyrtec (cetirizine), Xyzal (levocetirizine), Allegra (fexofenadine), and Claritin (loratidine).  ?Continue Flonase 2 sprays in each nostril once a day as needed for stuffy nose ?Consider saline nasal rinses as needed for nasal symptoms. Use this before any medicated nasal sprays for best result ? ?Viral illness ?Suspect co-viral infection ?Continue supportive therapy ? ?Call the clinic if this treatment plan is not working well for you ? ?Follow up in 1 month or sooner if needed. ? ?Reducing Pollen Exposure ?The American Academy of Allergy, Asthma and Immunology suggests the following steps to reduce your exposure to pollen during allergy seasons. ?Do not  hang sheets or clothing out to dry; pollen may collect on these items. ?Do not mow lawns or spend time around freshly cut grass; mowing stirs up pollen. ?Keep windows closed at night.  Keep car windows closed while driving. ?Minimize morning activities outdoors, a time when pollen counts are usually at their highest. ?Stay indoors as much as possible when pollen counts or humidity is high and on windy days when pollen tends to remain in the air longer. ?Use air conditioning when possible.  Many air conditioners have filters that trap the pollen spores. ?Use a HEPA room air filter to remove pollen form the indoor air you breathe. ? ?Control of Mold Allergen ?Mold and fungi can grow on a variety of surfaces provided certain temperature and moisture conditions exist.  Outdoor molds grow on plants, decaying vegetation and soil.  The major outdoor mold, Alternaria and Cladosporium, are found in very high numbers during hot and dry conditions.  Generally, a late Summer - Fall peak is seen for common outdoor fungal spores.  Rain will temporarily lower outdoor mold spore count, but counts rise rapidly when the rainy period ends.  The most important indoor molds are Aspergillus and Penicillium.  Dark, humid and poorly ventilated basements are ideal sites for mold growth.  The next most common sites of mold growth are the bathroom and the kitchen. ? ?Outdoor [employer] ?Use air conditioning and keep windows closed ?Avoid exposure to decaying vegetation. ?Avoid leaf raking. ?Avoid grain handling. ?Consider wearing a face mask if working in moldy areas. ? ?Indoor Mold Control ?Maintain humidity below 50%. ?Clean washable surfaces with 5% bleach solution. ?Remove sources e.g.  Contaminated carpets. ? ? ?Control of Dust Mite Allergen ?Dust mites play a major role in allergic asthma and rhinitis. They occur in environments with high humidity wherever human skin is found. Dust mites absorb humidity from the atmosphere (ie, they do  not drink) and feed on organic matter (including shed human and animal skin). Dust mites are a microscopic type of insect that you cannot see with the naked eye. High levels of dust mites have been detected from mattresses, pillows, carpets, upholstered furniture, bed covers, clothes, soft toys and any woven material. The principal allergen of the dust mite is found in its feces. A gram of dust may contain 1,000 mites and 250,000 fecal particles. Mite antigen is easily measured in the air during house cleaning activities. Dust mites do not bite and do not cause harm to humans, other than by triggering allergies/asthma. ? ?Ways to decrease your exposure to dust mites in your home: ? ?1. Encase mattresses, box springs and pillows with a mite-impermeable barrier or cover ? ?2. Wash sheets, blankets and drapes weekly in hot water (130? F) with detergent and dry them in a dryer on the hot setting. ? ?3. Have the room cleaned frequently with a vacuum cleaner and a damp dust-mop. For carpeting or rugs, vacuuming with a vacuum cleaner equipped with a high-efficiency particulate air (HEPA) filter. The dust mite allergic individual should not be in a room which is being cleaned and should wait 1 hour after cleaning before going into the room. ? ?4. Do not sleep on upholstered furniture (eg, couches). ? ?5. If possible removing carpeting, upholstered furniture and drapery from the home is ideal. Horizontal blinds should be eliminated in the rooms where the person spends the most time (bedroom, study, television room). Washable vinyl, roller-type shades are optimal. ? ?6. Remove all non-washable stuffed toys from the bedroom. Wash stuffed toys weekly like sheets and blankets above. ? ?7. Reduce indoor humidity to less than 50%. Inexpensive humidity monitors can be purchased at most hardware stores. Do not use a humidifier as can make the problem worse and are not recommended. ? ?Control of Dog or Cat Allergen ?Avoidance is the  best way to manage a dog or cat allergy. If you have a dog or cat and are allergic to dog or cats, consider removing the dog or cat from the home. ?If you have a dog or cat but don?t want to find it a new home, or if your family wants a pet even though someone in the household is allergic, here are some strategies that may help keep symptoms at bay: ? ?Keep the pet out of your bedroom and restrict it to only a few rooms. Be advised that keeping the dog or cat in only one room will not limit the allergens to that room. ?Don?t pet, hug or kiss the dog or cat; if you do, wash your hands with soap and water. ?High-efficiency particulate air (HEPA) cleaners run continuously in a bedroom or living room can reduce allergen levels over time. ?Regular use of a high-efficiency vacuum cleaner or a central vacuum can reduce allergen levels. ?Giving your dog or cat a bath at least once a week can reduce airborne allergen. ? ? ?

## 2021-10-20 ENCOUNTER — Encounter: Payer: Self-pay | Admitting: Family Medicine

## 2021-10-20 ENCOUNTER — Ambulatory Visit (INDEPENDENT_AMBULATORY_CARE_PROVIDER_SITE_OTHER): Payer: 59 | Admitting: Family Medicine

## 2021-10-20 VITALS — BP 126/82 | HR 84 | Temp 98.2°F | Resp 16 | Ht 63.0 in | Wt 170.0 lb

## 2021-10-20 DIAGNOSIS — R051 Acute cough: Secondary | ICD-10-CM

## 2021-10-20 DIAGNOSIS — J0110 Acute frontal sinusitis, unspecified: Secondary | ICD-10-CM | POA: Diagnosis not present

## 2021-10-20 DIAGNOSIS — J3089 Other allergic rhinitis: Secondary | ICD-10-CM

## 2021-10-20 DIAGNOSIS — B349 Viral infection, unspecified: Secondary | ICD-10-CM | POA: Insufficient documentation

## 2021-10-20 DIAGNOSIS — J4541 Moderate persistent asthma with (acute) exacerbation: Secondary | ICD-10-CM

## 2021-10-20 HISTORY — DX: Acute cough: R05.1

## 2021-10-20 MED ORDER — AMOXICILLIN-POT CLAVULANATE 875-125 MG PO TABS: 1.0000 | ORAL_TABLET | Freq: Two times a day (BID) | ORAL | 0 refills | Status: AC

## 2021-10-20 MED ORDER — ALBUTEROL SULFATE HFA 108 (90 BASE) MCG/ACT IN AERS: 2.0000 | INHALATION_SPRAY | RESPIRATORY_TRACT | 1 refills | Status: DC | PRN

## 2021-10-20 MED ORDER — BREZTRI AEROSPHERE 160-9-4.8 MCG/ACT IN AERO: 2.0000 | INHALATION_SPRAY | Freq: Two times a day (BID) | RESPIRATORY_TRACT | 5 refills | Status: DC

## 2021-10-23 DIAGNOSIS — R051 Acute cough: Secondary | ICD-10-CM | POA: Diagnosis not present

## 2021-10-24 DIAGNOSIS — M5412 Radiculopathy, cervical region: Secondary | ICD-10-CM | POA: Diagnosis not present

## 2021-10-25 ENCOUNTER — Ambulatory Visit (INDEPENDENT_AMBULATORY_CARE_PROVIDER_SITE_OTHER): Payer: 59

## 2021-10-25 DIAGNOSIS — J309 Allergic rhinitis, unspecified: Secondary | ICD-10-CM | POA: Diagnosis not present

## 2021-11-01 ENCOUNTER — Ambulatory Visit (INDEPENDENT_AMBULATORY_CARE_PROVIDER_SITE_OTHER): Payer: 59

## 2021-11-01 DIAGNOSIS — J309 Allergic rhinitis, unspecified: Secondary | ICD-10-CM | POA: Diagnosis not present

## 2021-11-10 ENCOUNTER — Ambulatory Visit (INDEPENDENT_AMBULATORY_CARE_PROVIDER_SITE_OTHER): Payer: 59

## 2021-11-10 DIAGNOSIS — J309 Allergic rhinitis, unspecified: Secondary | ICD-10-CM

## 2021-11-17 ENCOUNTER — Ambulatory Visit: Payer: Self-pay

## 2021-11-17 ENCOUNTER — Telehealth: Payer: Self-pay

## 2021-11-17 ENCOUNTER — Ambulatory Visit: Payer: 59 | Admitting: Family Medicine

## 2021-11-17 ENCOUNTER — Encounter: Payer: Self-pay | Admitting: Family Medicine

## 2021-11-17 ENCOUNTER — Other Ambulatory Visit: Payer: Self-pay | Admitting: Family Medicine

## 2021-11-17 VITALS — BP 126/86 | HR 77 | Temp 98.1°F | Resp 18 | Ht 63.39 in | Wt 172.0 lb

## 2021-11-17 DIAGNOSIS — J309 Allergic rhinitis, unspecified: Secondary | ICD-10-CM

## 2021-11-17 DIAGNOSIS — J4540 Moderate persistent asthma, uncomplicated: Secondary | ICD-10-CM

## 2021-11-17 DIAGNOSIS — Z8709 Personal history of other diseases of the respiratory system: Secondary | ICD-10-CM

## 2021-11-17 DIAGNOSIS — J3089 Other allergic rhinitis: Secondary | ICD-10-CM

## 2021-11-17 HISTORY — DX: Personal history of other diseases of the respiratory system: Z87.09

## 2021-11-17 NOTE — Progress Notes (Signed)
? ?[facility] ?[address] [place] [address] ?Dept: [phone] ? ?FOLLOW UP NOTE ? ?Patient ID: Anne Jefferson, female    DOB: Jun 18, 1976  Age: 46 y.o. MRN: [id number] ?Date of Office Visit: 11/17/2021 ? ?Assessment  ?Chief Complaint: Asthma (Has gotten better since last visit ) ? ?HPI ?Anne Jefferson is a 46 year old female who presents the clinic for a follow-up visit.  She was last seen in this clinic on 10/20/2021 by Gareth Morgan, FNP, for evaluation of asthma with acute exacerbation requiring prednisone for resolution of symptoms, allergic rhinitis on allergen immunotherapy, cough, and acute sinusitis requiring Augmentin for resolution of symptoms.  At today's visit, she reports her asthma has been much more well controlled since her last visit to this clinic with symptoms including occasional wheeze and occasional dry cough.  She denies shortness of breath with activity and rest.  She continues Breztri 2 puffs twice a day and albuterol about once a week with relief of symptoms.  She occasionally uses a spacer with her Breztri inhaler.  She has required prednisone for asthma flare three times over the last year.  Her last complete blood count with differential from 08/08/2021 indicates absolute eosinophil 100, however, chart review indicates she took prednisone prior to having the lab drawn.  The complete blood count with differential previous to that encounter was on 09/01/2020 with absolute eosinophil count 200.  Allergic rhinitis is reported as moderately well controlled with symptoms including nasal congestion, sneeze, and thick postnasal drainage with frequent throat clearing.  She continues Nasonex 1 spray in each nostril twice a day and uses frequent nasal saline rinses.  She has a history of nasal polyposis with nasal polypectomy in 2013.  She denies anosmia and ageusia at this time.  She continues allergen immunotherapy directed toward grass pollen, dust mite, cat, dog, and mold with no large or  local reactions.  She reports a slight decrease in her symptoms of allergic rhinitis while continuing on allergen immunotherapy.  Her current medications are listed in the chart. ? ? ?Drug Allergies:  ?Allergies  ?Allergen Reactions  ? Promethazine Other (See Comments)  ?  Hallucinations  ? Theophyllines Nausea And Vomiting  ? Mite (D. Farinae) Other (See Comments)  ?  Cats, dogs, dust   ? Molds & Smuts Other (See Comments)  ? ? ?Physical Exam: ?BP 126/86   Pulse 77   Temp 98.1 ?F (36.7 ?C)   Resp 18   Ht 5' 3.39" (1.61 m)   Wt 172 lb (78 kg)   SpO2 97%   BMI 30.10 kg/m?   ? ?Physical Exam ?Vitals reviewed.  ?Constitutional:   ?   Appearance: Normal appearance.  ?HENT:  ?   Head: Normocephalic and atraumatic.  ?   Right Ear: Tympanic membrane normal.  ?   Left Ear: Tympanic membrane normal.  ?   Nose:  ?   Comments: Bilateral nares slightly erythematous with clear nasal drainage noted.  Pharynx slightly erythematous.  Ears normal.  Eyes normal. ?Eyes:  ?   Conjunctiva/sclera: Conjunctivae normal.  ?Cardiovascular:  ?   Rate and Rhythm: Normal rate and regular rhythm.  ?   Heart sounds: Normal heart sounds. No murmur heard. ?Pulmonary:  ?   Effort: Pulmonary effort is normal.  ?   Breath sounds: Normal breath sounds.  ?   Comments: Lungs clear to auscultation ?Musculoskeletal:     ?   General: Normal range of motion.  ?   Cervical back: Normal range of motion  and neck supple.  ?Skin: ?   General: Skin is warm and dry.  ?Neurological:  ?   Mental Status: She is alert and oriented to person, place, and time.  ?Psychiatric:     ?   Mood and Affect: Mood normal.     ?   Behavior: Behavior normal.     ?   Thought Content: Thought content normal.     ?   Judgment: Judgment normal.  ? ? ?Diagnostics: ?FVC 3.05, FEV1 2.63.  Predicted FVC 3.67, predicted FEV1 3.18.  Spirometry indicates normal ventilatory function. ? ?Assessment and Plan: ?1. Moderate persistent asthma without complication   ?2. Perennial and seasonal  allergic rhinitis   ?3. History of nasal polyposis   ? ? ?Patient Instructions  ?Asthma ?Continue Breztri 2 puffs twice a day with a spacer to prevent cough or wheeze ?Continue albuterol 2 puffs once every 4 hours as needed for cough or wheeze ?You may use albuterol 2 puffs 5 to 15 minutes before activity to decrease cough or wheeze ?Consider a biologic medication for better asthma control. ?We will submit your information to your insurance company for [place]. You will next hear from our biologics coordinator, [place]. ? ?Allergic rhinitis ?Continue allergen avoidance measures directed toward grass pollen, dust mite, cat, dog, and mold as listed below ?Continue allergen immunotherapy once a week (when you have finished your antibiotic and are feeling better) and have access to an epinephrine autoinjector set ?Consider an antihistamine once a day as needed for runny nose or itch. Remember to rotate to a different antihistamine about every 3 months. Some examples of over the counter antihistamines include Zyrtec (cetirizine), Xyzal (levocetirizine), Allegra (fexofenadine), and Claritin (loratidine).  ?Continue Flonase 2 sprays in each nostril once a day as needed for stuffy nose ?Consider saline nasal rinses as needed for nasal symptoms. Use this before any medicated nasal sprays for best result ? ?History of nasal polyposis ?Continue Flonase nasal spray as above ? ?Call the clinic if this treatment plan is not working well for you ? ?Follow up in 6 months or sooner if needed. ? ? ?Return in about 6 months (around 05/19/2022), or if symptoms worsen or fail to improve. ?  ? ?Thank you for the opportunity to care for this patient.  Please do not hesitate to contact me with questions. ? ?Gareth Morgan, FNP ?[facility] ? ? ? ? ? ?

## 2021-11-17 NOTE — Patient Instructions (Addendum)
Asthma ?Continue Breztri 2 puffs twice a day with a spacer to prevent cough or wheeze ?Continue albuterol 2 puffs once every 4 hours as needed for cough or wheeze ?You may use albuterol 2 puffs 5 to 15 minutes before activity to decrease cough or wheeze ?Consider a biologic medication for better asthma control. ?We will submit your information to your insurance company for [facility]. You will next hear from our biologics coordinator, [place]le. ? ?Allergic rhinitis ?Continue allergen avoidance measures directed toward grass pollen, dust mite, cat, dog, and mold as listed below ?Continue allergen immunotherapy once a week (when you have finished your antibiotic and are feeling better) and have access to an epinephrine autoinjector set ?Consider an antihistamine once a day as needed for runny nose or itch. Remember to rotate to a different antihistamine about every 3 months. Some examples of over the counter antihistamines include Zyrtec (cetirizine), Xyzal (levocetirizine), Allegra (fexofenadine), and Claritin (loratidine).  ?Continue Flonase 2 sprays in each nostril once a day as needed for stuffy nose ?Consider saline nasal rinses as needed for nasal symptoms. Use this before any medicated nasal sprays for best result ? ?History of nasal polyposis ?Continue Flonase nasal spray as above ? ?Call the clinic if this treatment plan is not working well for you ? ?Follow up in 6 months or sooner if needed. ? ?Reducing Pollen Exposure ?The American Academy of Allergy, Asthma and Immunology suggests the following steps to reduce your exposure to pollen during allergy seasons. ?Do not hang sheets or clothing out to dry; pollen may collect on these items. ?Do not mow lawns or spend time around freshly cut grass; mowing stirs up pollen. ?Keep windows closed at night.  Keep car windows closed while driving. ?Minimize morning activities outdoors, a time when pollen counts are usually at their highest. ?Stay indoors as much as  possible when pollen counts or humidity is high and on windy days when pollen tends to remain in the air longer. ?Use air conditioning when possible.  Many air conditioners have filters that trap the pollen spores. ?Use a HEPA room air filter to remove pollen form the indoor air you breathe. ? ?Control of Mold Allergen ?Mold and fungi can grow on a variety of surfaces provided certain temperature and moisture conditions exist.  Outdoor molds grow on plants, decaying vegetation and soil.  The major outdoor mold, Alternaria and Cladosporium, are found in very high numbers during hot and dry conditions.  Generally, a late Summer - Fall peak is seen for common outdoor fungal spores.  Rain will temporarily lower outdoor mold spore count, but counts rise rapidly when the rainy period ends.  The most important indoor molds are Aspergillus and Penicillium.  Dark, humid and poorly ventilated basements are ideal sites for mold growth.  The next most common sites of mold growth are the bathroom and the kitchen. ? ?Outdoor [employer] ?Use air conditioning and keep windows closed ?Avoid exposure to decaying vegetation. ?Avoid leaf raking. ?Avoid grain handling. ?Consider wearing a face mask if working in moldy areas. ? ?Indoor Mold Control ?Maintain humidity below 50%. ?Clean washable surfaces with 5% bleach solution. ?Remove sources e.g. Contaminated carpets. ? ? ?Control of Dust Mite Allergen ?Dust mites play a major role in allergic asthma and rhinitis. They occur in environments with high humidity wherever human skin is found. Dust mites absorb humidity from the atmosphere (ie, they do not drink) and feed on organic matter (including shed human and animal skin). Dust mites are a  microscopic type of insect that you cannot see with the naked eye. High levels of dust mites have been detected from mattresses, pillows, carpets, upholstered furniture, bed covers, clothes, soft toys and any woven material. The principal allergen  of the dust mite is found in its feces. A gram of dust may contain 1,000 mites and 250,000 fecal particles. Mite antigen is easily measured in the air during house cleaning activities. Dust mites do not bite and do not cause harm to humans, other than by triggering allergies/asthma. ? ?Ways to decrease your exposure to dust mites in your home: ? ?1. Encase mattresses, box springs and pillows with a mite-impermeable barrier or cover ? ?2. Wash sheets, blankets and drapes weekly in hot water (130? F) with detergent and dry them in a dryer on the hot setting. ? ?3. Have the room cleaned frequently with a vacuum cleaner and a damp dust-mop. For carpeting or rugs, vacuuming with a vacuum cleaner equipped with a high-efficiency particulate air (HEPA) filter. The dust mite allergic individual should not be in a room which is being cleaned and should wait 1 hour after cleaning before going into the room. ? ?4. Do not sleep on upholstered furniture (eg, couches). ? ?5. If possible removing carpeting, upholstered furniture and drapery from the home is ideal. Horizontal blinds should be eliminated in the rooms where the person spends the most time (bedroom, study, television room). Washable vinyl, roller-type shades are optimal. ? ?6. Remove all non-washable stuffed toys from the bedroom. Wash stuffed toys weekly like sheets and blankets above. ? ?7. Reduce indoor humidity to less than 50%. Inexpensive humidity monitors can be purchased at most hardware stores. Do not use a humidifier as can make the problem worse and are not recommended. ? ?Control of Dog or Cat Allergen ?Avoidance is the best way to manage a dog or cat allergy. If you have a dog or cat and are allergic to dog or cats, consider removing the dog or cat from the home. ?If you have a dog or cat but don?t want to find it a new home, or if your family wants a pet even though someone in the household is allergic, here are some strategies that may help keep symptoms  at bay: ? ?Keep the pet out of your bedroom and restrict it to only a few rooms. Be advised that keeping the dog or cat in only one room will not limit the allergens to that room. ?Don?t pet, hug or kiss the dog or cat; if you do, wash your hands with soap and water. ?High-efficiency particulate air (HEPA) cleaners run continuously in a bedroom or living room can reduce allergen levels over time. ?Regular use of a high-efficiency vacuum cleaner or a central vacuum can reduce allergen levels. ?Giving your dog or cat a bath at least once a week can reduce airborne allergen. ? ? ?

## 2021-11-17 NOTE — Telephone Encounter (Signed)
-----   Message from Dara Hoyer, FNP sent at 11/17/2021  3:11 PM EDT ----- ?Can you please call this patient and let her know that her insurance company requires a new CBC with diff that shows an elevated eosinophil count to be considered for Dupixent. She has had an elevated eos count on a previous CBC, however, her last CBC showed a normal level. She was taking prednisone at the time her last lab was drawn which can lower the count. I have placed an order if she is interested in moving forward with trying to get [place] for her asthma. Thank you ? ?

## 2021-11-17 NOTE — Telephone Encounter (Signed)
Patient plans to get the blood work done at the [facility] office on a day she come for her allergy shots. She will call 12-20 minutes before so that the front office and notify Rachelle.  ?

## 2021-11-22 ENCOUNTER — Ambulatory Visit (INDEPENDENT_AMBULATORY_CARE_PROVIDER_SITE_OTHER): Payer: 59

## 2021-11-22 DIAGNOSIS — J309 Allergic rhinitis, unspecified: Secondary | ICD-10-CM

## 2021-12-06 ENCOUNTER — Ambulatory Visit (INDEPENDENT_AMBULATORY_CARE_PROVIDER_SITE_OTHER): Payer: 59

## 2021-12-06 DIAGNOSIS — J309 Allergic rhinitis, unspecified: Secondary | ICD-10-CM

## 2021-12-14 DIAGNOSIS — Z5181 Encounter for therapeutic drug level monitoring: Secondary | ICD-10-CM | POA: Diagnosis not present

## 2021-12-14 DIAGNOSIS — Z0000 Encounter for general adult medical examination without abnormal findings: Secondary | ICD-10-CM | POA: Diagnosis not present

## 2021-12-14 DIAGNOSIS — G8929 Other chronic pain: Secondary | ICD-10-CM | POA: Diagnosis not present

## 2021-12-14 DIAGNOSIS — M2550 Pain in unspecified joint: Secondary | ICD-10-CM | POA: Diagnosis not present

## 2021-12-14 DIAGNOSIS — K900 Celiac disease: Secondary | ICD-10-CM | POA: Diagnosis not present

## 2021-12-14 DIAGNOSIS — R110 Nausea: Secondary | ICD-10-CM | POA: Diagnosis not present

## 2021-12-14 DIAGNOSIS — M5412 Radiculopathy, cervical region: Secondary | ICD-10-CM | POA: Diagnosis not present

## 2021-12-14 DIAGNOSIS — Z9109 Other allergy status, other than to drugs and biological substances: Secondary | ICD-10-CM | POA: Diagnosis not present

## 2021-12-14 DIAGNOSIS — E559 Vitamin D deficiency, unspecified: Secondary | ICD-10-CM | POA: Diagnosis not present

## 2021-12-15 DIAGNOSIS — Z3009 Encounter for other general counseling and advice on contraception: Secondary | ICD-10-CM | POA: Diagnosis not present

## 2021-12-15 DIAGNOSIS — K2970 Gastritis, unspecified, without bleeding: Secondary | ICD-10-CM | POA: Diagnosis not present

## 2021-12-15 DIAGNOSIS — N920 Excessive and frequent menstruation with regular cycle: Secondary | ICD-10-CM | POA: Diagnosis not present

## 2021-12-21 DIAGNOSIS — N920 Excessive and frequent menstruation with regular cycle: Secondary | ICD-10-CM | POA: Diagnosis not present

## 2021-12-21 DIAGNOSIS — Z30430 Encounter for insertion of intrauterine contraceptive device: Secondary | ICD-10-CM | POA: Diagnosis not present

## 2021-12-22 ENCOUNTER — Ambulatory Visit (INDEPENDENT_AMBULATORY_CARE_PROVIDER_SITE_OTHER): Payer: 59

## 2021-12-22 DIAGNOSIS — J309 Allergic rhinitis, unspecified: Secondary | ICD-10-CM | POA: Diagnosis not present

## 2021-12-22 DIAGNOSIS — D802 Selective deficiency of immunoglobulin A [IgA]: Secondary | ICD-10-CM | POA: Diagnosis not present

## 2021-12-22 DIAGNOSIS — M79641 Pain in right hand: Secondary | ICD-10-CM | POA: Diagnosis not present

## 2021-12-22 DIAGNOSIS — M79672 Pain in left foot: Secondary | ICD-10-CM | POA: Diagnosis not present

## 2021-12-22 DIAGNOSIS — M2550 Pain in unspecified joint: Secondary | ICD-10-CM | POA: Diagnosis not present

## 2021-12-22 DIAGNOSIS — M79642 Pain in left hand: Secondary | ICD-10-CM | POA: Diagnosis not present

## 2021-12-22 DIAGNOSIS — M79671 Pain in right foot: Secondary | ICD-10-CM | POA: Diagnosis not present

## 2021-12-22 DIAGNOSIS — R768 Other specified abnormal immunological findings in serum: Secondary | ICD-10-CM | POA: Diagnosis not present

## 2021-12-26 ENCOUNTER — Ambulatory Visit: Payer: 59 | Admitting: Podiatry

## 2021-12-29 ENCOUNTER — Ambulatory Visit (INDEPENDENT_AMBULATORY_CARE_PROVIDER_SITE_OTHER): Payer: 59

## 2021-12-29 ENCOUNTER — Ambulatory Visit: Payer: 59 | Admitting: Podiatry

## 2021-12-29 DIAGNOSIS — S99921A Unspecified injury of right foot, initial encounter: Secondary | ICD-10-CM

## 2021-12-29 DIAGNOSIS — M898X7 Other specified disorders of bone, ankle and foot: Secondary | ICD-10-CM | POA: Diagnosis not present

## 2021-12-29 DIAGNOSIS — G5791 Unspecified mononeuropathy of right lower limb: Secondary | ICD-10-CM | POA: Diagnosis not present

## 2022-01-02 NOTE — Progress Notes (Signed)
  Subjective:  Patient ID: Anne Jefferson, female    DOB: 1975-11-16,  MRN: [id number]  Chief Complaint  Patient presents with   Foot Injury    Possible fracture due to roller skating accident    46 y.o. female presents with the above complaint. History confirmed with patient.  She has a new issue today she injured it recently while she was rollerskating.,  She is worried she fractured it  Objective:  Physical Exam: warm, good capillary refill, no trophic changes or ulcerative lesions, normal DP and PT pulses, normal sensory exam, and mild tenderness over the exostosis of the right foot which radiates into the toes.   Radiographs: Multiple views x-ray of right feet: no fracture, dislocation, swelling or degenerative changes noted and there is dorsal spurring around the first TMT Assessment:   1. Neuritis of right foot   2. Exostosis of right foot      Plan:  Patient was evaluated and treated and all questions answered.  Suspect she has compression of the dorsal cutaneous nerves discussed offloading this area avoiding compression with shoe gear and resting from rollerskating for now.  Expect this to resolve uneventfully.  Discussed using Voltaren gel or lidocaine cream as needed.  Return if symptoms worsen or fail to improve.

## 2022-01-03 ENCOUNTER — Ambulatory Visit (INDEPENDENT_AMBULATORY_CARE_PROVIDER_SITE_OTHER): Payer: 59

## 2022-01-03 DIAGNOSIS — J309 Allergic rhinitis, unspecified: Secondary | ICD-10-CM

## 2022-01-11 DIAGNOSIS — J3089 Other allergic rhinitis: Secondary | ICD-10-CM | POA: Diagnosis not present

## 2022-01-11 NOTE — Progress Notes (Signed)
VIALS EXP 01-12-23

## 2022-01-12 DIAGNOSIS — D802 Selective deficiency of immunoglobulin A [IgA]: Secondary | ICD-10-CM | POA: Diagnosis not present

## 2022-01-12 DIAGNOSIS — M2550 Pain in unspecified joint: Secondary | ICD-10-CM | POA: Diagnosis not present

## 2022-01-12 DIAGNOSIS — R768 Other specified abnormal immunological findings in serum: Secondary | ICD-10-CM | POA: Diagnosis not present

## 2022-01-12 DIAGNOSIS — Z79899 Other long term (current) drug therapy: Secondary | ICD-10-CM | POA: Diagnosis not present

## 2022-01-19 ENCOUNTER — Ambulatory Visit (INDEPENDENT_AMBULATORY_CARE_PROVIDER_SITE_OTHER): Payer: 59

## 2022-01-19 DIAGNOSIS — J309 Allergic rhinitis, unspecified: Secondary | ICD-10-CM | POA: Diagnosis not present

## 2022-01-20 ENCOUNTER — Encounter: Payer: Self-pay | Admitting: Family Medicine

## 2022-01-20 NOTE — Telephone Encounter (Signed)
Let's repeat that last dose. Can you please make sure she is taking an antihistamine on injection day? She can double up on antihistamines for the next day or 2 if the area is red and itchy. Thank you

## 2022-02-01 ENCOUNTER — Emergency Department (HOSPITAL_BASED_OUTPATIENT_CLINIC_OR_DEPARTMENT_OTHER): Payer: 59

## 2022-02-01 ENCOUNTER — Observation Stay (HOSPITAL_BASED_OUTPATIENT_CLINIC_OR_DEPARTMENT_OTHER)
Admission: EM | Admit: 2022-02-01 | Discharge: 2022-02-03 | Disposition: A | Discharge status: Home / Self Care | Payer: 59 | Attending: Family Medicine | Admitting: Family Medicine

## 2022-02-01 ENCOUNTER — Other Ambulatory Visit: Payer: Self-pay

## 2022-02-01 ENCOUNTER — Encounter (HOSPITAL_BASED_OUTPATIENT_CLINIC_OR_DEPARTMENT_OTHER): Payer: Self-pay | Admitting: Emergency Medicine

## 2022-02-01 DIAGNOSIS — F419 Anxiety disorder, unspecified: Secondary | ICD-10-CM

## 2022-02-01 DIAGNOSIS — R7989 Other specified abnormal findings of blood chemistry: Secondary | ICD-10-CM

## 2022-02-01 DIAGNOSIS — M4722 Other spondylosis with radiculopathy, cervical region: Secondary | ICD-10-CM | POA: Diagnosis not present

## 2022-02-01 DIAGNOSIS — Z20822 Contact with and (suspected) exposure to covid-19: Secondary | ICD-10-CM | POA: Insufficient documentation

## 2022-02-01 DIAGNOSIS — R531 Weakness: Secondary | ICD-10-CM | POA: Diagnosis not present

## 2022-02-01 DIAGNOSIS — L209 Atopic dermatitis, unspecified: Secondary | ICD-10-CM | POA: Insufficient documentation

## 2022-02-01 DIAGNOSIS — R42 Dizziness and giddiness: Secondary | ICD-10-CM | POA: Insufficient documentation

## 2022-02-01 DIAGNOSIS — J4540 Moderate persistent asthma, uncomplicated: Secondary | ICD-10-CM | POA: Diagnosis not present

## 2022-02-01 DIAGNOSIS — F8081 Childhood onset fluency disorder: Secondary | ICD-10-CM

## 2022-02-01 DIAGNOSIS — M069 Rheumatoid arthritis, unspecified: Secondary | ICD-10-CM

## 2022-02-01 DIAGNOSIS — R946 Abnormal results of thyroid function studies: Secondary | ICD-10-CM | POA: Insufficient documentation

## 2022-02-01 DIAGNOSIS — R29898 Other symptoms and signs involving the musculoskeletal system: Secondary | ICD-10-CM

## 2022-02-01 DIAGNOSIS — F985 Adult onset fluency disorder: Secondary | ICD-10-CM

## 2022-02-01 DIAGNOSIS — Z79899 Other long term (current) drug therapy: Secondary | ICD-10-CM | POA: Diagnosis not present

## 2022-02-01 DIAGNOSIS — Z8709 Personal history of other diseases of the respiratory system: Secondary | ICD-10-CM

## 2022-02-01 DIAGNOSIS — E669 Obesity, unspecified: Secondary | ICD-10-CM

## 2022-02-01 DIAGNOSIS — Z8742 Personal history of other diseases of the female genital tract: Secondary | ICD-10-CM

## 2022-02-01 DIAGNOSIS — R29818 Other symptoms and signs involving the nervous system: Secondary | ICD-10-CM | POA: Diagnosis not present

## 2022-02-01 HISTORY — DX: Adult onset fluency disorder: F98.5

## 2022-02-01 LAB — DIFFERENTIAL
Abs Immature Granulocytes: 0.04 10*3/uL (ref 0.00–0.07)
Basophils Absolute: 0.1 10*3/uL (ref 0.0–0.1)
Basophils Relative: 0 %
Eosinophils Absolute: 0 10*3/uL (ref 0.0–0.5)
Eosinophils Relative: 0 %
Immature Granulocytes: 0 %
Lymphocytes Relative: 16 %
Lymphs Abs: 1.9 10*3/uL (ref 0.7–4.0)
Monocytes Absolute: 0.9 10*3/uL (ref 0.1–1.0)
Monocytes Relative: 8 %
Neutro Abs: 9.1 10*3/uL — ABNORMAL HIGH (ref 1.7–7.7)
Neutrophils Relative %: 76 %

## 2022-02-01 LAB — URINALYSIS, ROUTINE W REFLEX MICROSCOPIC
Bilirubin Urine: NEGATIVE
Glucose, UA: NEGATIVE mg/dL
Hgb urine dipstick: NEGATIVE
Ketones, ur: NEGATIVE mg/dL
Leukocytes,Ua: NEGATIVE
Nitrite: NEGATIVE
Specific Gravity, Urine: 1.024 (ref 1.005–1.030)
pH: 5 (ref 5.0–8.0)

## 2022-02-01 LAB — COMPREHENSIVE METABOLIC PANEL
ALT: 16 U/L (ref 0–44)
AST: 14 U/L — ABNORMAL LOW (ref 15–41)
Albumin: 4.6 g/dL (ref 3.5–5.0)
Alkaline Phosphatase: 46 U/L (ref 38–126)
Anion gap: 10 (ref 5–15)
BUN: 18 mg/dL (ref 6–20)
CO2: 28 mmol/L (ref 22–32)
Calcium: 9.9 mg/dL (ref 8.9–10.3)
Chloride: 98 mmol/L (ref 98–111)
Creatinine, Ser: 0.75 mg/dL (ref 0.44–1.00)
GFR, Estimated: >60 mL/min (ref >60)
Glucose, Bld: 186 mg/dL — ABNORMAL HIGH (ref 70–99)
Potassium: 3.9 mmol/L (ref 3.5–5.1)
Sodium: 136 mmol/L (ref 135–145)
Total Bilirubin: 0.4 mg/dL (ref 0.3–1.2)
Total Protein: 7.2 g/dL (ref 6.5–8.1)

## 2022-02-01 LAB — CBC
HCT: 41.8 % (ref 36.0–46.0)
Hemoglobin: 13.7 g/dL (ref 12.0–15.0)
MCH: 28.8 pg (ref 26.0–34.0)
MCHC: 32.8 g/dL (ref 30.0–36.0)
MCV: 87.8 fL (ref 80.0–100.0)
Platelets: 336 10*3/uL (ref 150–400)
RBC: 4.76 MIL/uL (ref 3.87–5.11)
RDW: 13.2 % (ref 11.5–15.5)
WBC: 12.1 10*3/uL — ABNORMAL HIGH (ref 4.0–10.5)
nRBC: 0 % (ref 0.0–0.2)

## 2022-02-01 LAB — PROTIME-INR
INR: 1 (ref 0.8–1.2)
Prothrombin Time: 13.2 seconds (ref 11.4–15.2)

## 2022-02-01 LAB — PREGNANCY, URINE: Preg Test, Ur: NEGATIVE

## 2022-02-01 LAB — RAPID URINE DRUG SCREEN, HOSP PERFORMED
Amphetamines: NOT DETECTED
Barbiturates: NOT DETECTED
Benzodiazepines: NOT DETECTED
Cocaine: NOT DETECTED
Opiates: NOT DETECTED
Tetrahydrocannabinol: NOT DETECTED

## 2022-02-01 LAB — ETHANOL: Alcohol, Ethyl (B): <10 mg/dL (ref <10)

## 2022-02-01 LAB — APTT: aPTT: 27 seconds (ref 24–36)

## 2022-02-01 MED ORDER — IOHEXOL 350 MG/ML SOLN
100.0000 mL | Freq: Once | INTRAVENOUS | Status: AC | PRN
  Administered 2022-02-01: 75 mL via INTRAVENOUS

## 2022-02-01 NOTE — ED Triage Notes (Addendum)
Patient reports stuttering speech that started at 1400 this afternoon. Patient also reports dizziness/unsteadiness which has resolved. Patient reports she was normal prior to 1400.  Patient does have stuttering speech in triage.

## 2022-02-01 NOTE — ED Provider Notes (Signed)
[facility] EMERGENCY DEPT Provider Note   CSN: [id number] Arrival date & time: 02/01/22  2136  An emergency department physician performed an initial assessment on this suspected stroke patient at 2200.  History  Chief Complaint  Patient presents with   Stuttering    Anne Jefferson is a 46 y.o. female.  HPI     46 year old female comes in with chief complaint of stuttering.  Patient has history of anxiety, biliary colic.  She states that she has had intermittent stuttering in the past, but this started and resolved on its own.  This afternoon p.m. she was last known normal -thereafter she has had stutter that has not improved.  She also had some dizziness earlier in the day described as feeling unsteady.  Review of system is negative for any one-sided weakness, numbness, chest pain, shortness of breath, headache, neck pain. Patient does indicate that at baseline she has some diplopia through her left eye when looking left laterally.  She denies any substance use disorder.  Home Medications Prior to Admission medications   Medication Sig Start Date End Date Taking? Authorizing Provider  albuterol (PROVENTIL HFA) 108 (90 Base) MCG/ACT inhaler Inhale 2 puffs into the lungs every 4 (four) hours as needed for wheezing or shortness of breath. 10/20/21   Dara Hoyer, FNP  albuterol (PROVENTIL) (2.5 MG/3ML) 0.083% nebulizer solution Use 1 unit dose via the nebulizer every 6 hours as needed for cough, wheeze, tightness in chest, or shortness of breath. 05/27/21   Althea Charon, FNP  BREZTRI AEROSPHERE 160-9-4.8 MCG/ACT AERO Inhale 2 puffs into the lungs in the morning and at bedtime. 10/20/21   Dara Hoyer, FNP  celecoxib (CELEBREX) 100 MG capsule Take 100 mg by mouth 2 (two) times daily.    [provider]  EPINEPHrine (AUVI-Q) 0.3 mg/0.3 mL IJ SOAJ injection Inject 0.3 mg into the muscle as needed for anaphylaxis. 07/29/20   Garnet Sierras, DO  fexofenadine  (ALLEGRA) 180 MG tablet Take 180 mg by mouth daily.    [provider]  gabapentin (NEURONTIN) 300 MG capsule Take 300 mg by mouth 3 (three) times daily.    [provider]  mometasone (NASONEX) 50 MCG/ACT nasal spray Place 1 spray into the nose daily. 07/29/20   Garnet Sierras, DO  omeprazole (PRILOSEC OTC) 20 MG tablet Take 1 tablet (20 mg total) by mouth daily. 05/27/21   Althea Charon, FNP  traMADol (ULTRAM) 50 MG tablet Take 50 mg by mouth 2 (two) times daily as needed. 03/05/21   [provider]  UNABLE TO FIND 2 (two) times a week. Med Name: ALLERGY SHOTS    [provider]  VITAMIN D PO Take by mouth daily.    [provider]      Allergies    Promethazine, Theophyllines, Mite (d. farinae), and Molds & smuts    Review of Systems   Review of Systems  All other systems reviewed and are negative.   Physical Exam Updated Vital Signs BP 132/81   Pulse 78   Temp 98.4 F (36.9 C)   Resp 16   Ht '5\' 3"'$  (1.6 m)   Wt 77.1 kg   SpO2 93%   BMI 30.11 kg/m  Physical Exam Vitals and nursing note reviewed.  Constitutional:      Appearance: She is well-developed.  HENT:     Head: Atraumatic.  Eyes:     Extraocular Movements: Extraocular movements intact.     Pupils: Pupils are  equal, round, and reactive to light.  Cardiovascular:     Rate and Rhythm: Normal rate.  Pulmonary:     Effort: Pulmonary effort is normal.  Musculoskeletal:     Cervical back: Normal range of motion and neck supple.  Skin:    General: Skin is warm and dry.  Neurological:     Mental Status: She is alert and oriented to person, place, and time.     Comments: On exam, patient noted to have right-sided upper extremity drift Along with that, she is having diplopia with left eye lateral gaze and she also has new stutter (?aphasia)     ED Results / Procedures / Treatments   Labs (all labs ordered are listed, but only abnormal results are displayed) Labs Reviewed   CBC - Abnormal; Notable for the following components:      Result Value   WBC 12.1 (*)    All other components within normal limits  DIFFERENTIAL - Abnormal; Notable for the following components:   Neutro Abs 9.1 (*)    All other components within normal limits  COMPREHENSIVE METABOLIC PANEL - Abnormal; Notable for the following components:   Glucose, Bld 186 (*)    AST 14 (*)    All other components within normal limits  URINALYSIS, ROUTINE W REFLEX MICROSCOPIC - Abnormal; Notable for the following components:   Protein, ur TRACE (*)    All other components within normal limits  RESP PANEL BY RT-PCR (FLU A&B, COVID) ARPGX2  ETHANOL  PROTIME-INR  APTT  RAPID URINE DRUG SCREEN, HOSP PERFORMED  PREGNANCY, URINE    EKG EKG Interpretation  Date/Time:  Wednesday February 01 2022 21:46:33 EDT Ventricular Rate:  85 PR Interval:  128 QRS Duration: 88 QT Interval:  364 QTC Calculation: 433 R Axis:   30 Text Interpretation: Normal sinus rhythm Cannot rule out Anterior infarct , age undetermined Abnormal ECG When compared with ECG of 01-Sep-2020 11:45, No significant change was found No acute changes Confirmed by Varney Biles [id number]) on 02/01/2022 10:41:46 PM  Radiology No results found.  Procedures Procedures    Medications Ordered in ED Medications  iohexol (OMNIPAQUE) 350 MG/ML injection 100 mL (75 mLs Intravenous Contrast Given 02/01/22 2218)    ED Course/ Medical Decision Making/ A&P                           Medical Decision Making Amount and/or Complexity of Data Reviewed Labs: ordered. Radiology: ordered.  Risk Prescription drug management. Decision regarding hospitalization.   This patient presents to the ED with chief complaint(s) of  with pertinent past medical history of  stuttering, dizziness and she is noted to have right upper extremity drift which further complicates the presenting complaint. The complaint involves an extensive differential diagnosis and  also carries with it a high risk of complications and morbidity.    The differential diagnosis includes multiple sclerosis, LVO stroke, electrolyte abnormality, anxiety, toxin mediated process  The initial plan is to activate code stroke.  CT angio head and neck ordered. Stroke labs ordered.  Additional history obtained: Records reviewed Primary Care Documents  Independent labs interpretation:  The following labs were independently interpreted: CBC, complete metabolic profile, UA are reassuring.  Urine drug screen is negative for illicit substance.  Independent visualization of imaging: - I independently visualized the following imaging with scope of interpretation limited to determining acute life threatening conditions related to emergency care: CT scan of the brain, which revealed  no evidence of acute brain bleed.  Treatment and Reassessment: Teleneurology has assessed the patient.  Their suspicion for stroke is low, they recommend that patient be admitted for rule out stroke and get MRI brain with and without contrast as well.  Consultation: - Consulted or discussed management/test interpretation w/ external professional: Teleneurology.   Final Clinical Impression(s) / ED Diagnoses Final diagnoses:  Stuttering  Weakness of right upper extremity    Rx / DC Orders ED Discharge Orders     None         Varney Biles, MD 02/01/22 2329

## 2022-02-01 NOTE — Consult Note (Signed)
[facility] Tele[facility]urology Consult Services   Patient Name:   Anne Jefferson, Anne Jefferson Date of Birth:   14-Mar-1976 Identification Number:   MRN - [id number] Date of Service:   02/01/2022 22:26:02  Diagnosis:       R47.81 - Slurred speech  Impression:      46 year old female with possible RA but no vascular medical history who around 2PM started to have dizziness (gait unsteadiness) and speech stuttering. She has had the speech stuttering before but it was previously more mild. She also has baseline right arm issues related to arthritis. She denies visual changes, headache, LOC or convulsions. On exam, she spoke in an irregular stutter. She spoke fluently but then stuttered randomly. Head CT shows no hemorrhage.    Possibilities include CVA, conversion disorder, demyelinating disease, medication effect and seizure. She is OOW for thrombolytics. We will need to fu on her CTA results and involve NIR if there is LVO (per my read there was no proximal intracranial LVO). Otherwise, the patient will need the traditional medical and neurologic management and workup including MRI brain with gad and EEG (see below).  Our recommendations are outlined below.  Recommendations:        Telemetry        Neuro Checks       Bedside Swallow Eval       DVT Prophylaxis       IV Fluids, Normal Saline       Head of Bed 30 Degrees       Euglycemia and Avoid Hyperthermia (PRN Acetaminophen)       Initiate Aspirin 81 MG daily       Antihypertensives PRN if Blood pressure is greater than 220/120 or there is a concern for End organ damage/contraindications for permissive HTN. If blood pressure is greater than 220/120 give labetalol PO or IV or Vasotec IV with a goal of 15% reduction in BP during the first 24 hours.  Per facility request will defer further work up, management, and referrals to inpatient service, inclusive of inpatient neurology  consult.    ------------------------------------------------------------------------------  Advanced Imaging: CTA Head and Neck Completed.  LVO:No per my read  Patient doesn't meet criteria for emergent NIR consideration   Metrics: Last Known Well: 02/01/2022 14:00:00 [facility] Notification Time: 02/01/2022 72:53:66 Arrival Time: 02/01/2022 21:36:00 Stamp Time: 02/01/2022 22:26:02 Initial Response Time: 02/01/2022 22:33:36 Symptoms: dizziness. Initial patient interaction: 02/01/2022 22:50:39 NIHSS Assessment Completed: 02/01/2022 22:54:44 Patient is not a candidate for Thrombolytic. Thrombolytic Medical Decision: 02/01/2022 22:54:46 Patient was not deemed candidate for Thrombolytic because of following reasons: Last Well Known Above 4.5 Hours.  I personally Reviewed the CT Head and it showed no hemorrhage.  Primary Provider Notified of Diagnostic Impression and Management Plan on: 02/01/2022 23:09:18    ------------------------------------------------------------------------------  History of Present Illness: Patient is a 46 year old Female.  Patient was brought by private transportation with symptoms of dizziness. 46 year old female with possible RA but no vascular medical history who around 2PM started to have dizziness (gait unsteadiness) and speech stuttering. She has had the speech stuttering before but it was previously more mild. She also has baseline right arm issues related to arthritis. She denies visual changes, headache, LOC or convulsions.  This morning she took gabapentin, tramadol, celebrex, cymbalta and hydroxychloroquine. She denies taking any extra medications or recent dose changes.    Past Medical History:      There is no history of Hypertension      There is no history of Diabetes  Mellitus      There is no history of Hyperlipidemia      There is no history of Stroke      There is no history of Seizures  Medications:  No Anticoagulant  use  No Antiplatelet use  Allergies:  Reviewed Description: Theophylline, phenergan  Social History: Smoking: No Alcohol Use: No Drug Use: No  Family History:  There is no family history of premature cerebrovascular disease pertinent to this consultation  ROS : A complete pertinent Review of Systems was performed and was negative except mentioned in HPI.       Examination: BP(173/113), Pulse(131), 1A: Level of Consciousness - Alert; keenly responsive + 0 1B: Ask Month and Age - Both Questions Right + 0 1C: Blink Eyes & Squeeze Hands - Performs Both Tasks + 0 2: Test Horizontal Extraocular Movements - Normal + 0 3: Test Visual Fields - No Visual Loss + 0 4: Test Facial Palsy (Use Grimace if Obtunded) - Normal symmetry + 0 5A: Test Left Arm Motor Drift - No Drift for 10 Seconds + 0 5B: Test Right Arm Motor Drift - No Drift for 10 Seconds + 0 6A: Test Left Leg Motor Drift - No Drift for 5 Seconds + 0 6B: Test Right Leg Motor Drift - No Drift for 5 Seconds + 0 7: Test Limb Ataxia (FNF/Heel-Shin) - No Ataxia + 0 8: Test Sensation - Normal; No sensory loss + 0 9: Test Language/Aphasia - Normal; No aphasia + 0 10: Test Dysarthria - Mild-Moderate Dysarthria: Stuttering but can be understood + 1 11: Test Extinction/Inattention - No abnormality + 0  NIHSS Score: 1  NIHSS Free Text : She spoke in an irregular stutter. She spoke fluently but then stuttered randomly.  Pre-Morbid Modified Rankin Scale: 0 Points = No symptoms at all   Patient/Family was informed the Neurology Consult would occur via TeleHealth consult by way of interactive audio and video telecommunications and consented to receiving care in this manner.   Patient is being evaluated for possible acute neurologic impairment and high probability of imminent or life-threatening deterioration. I spent total of 30 minutes providing care to this patient, including time for face to face visit via telemedicine, review of  medical records, imaging studies and discussion of findings with providers, the patient and/or family.   Dr Albertha Ghee   [facility] For Inpatient follow-up with [facility] physician please call [facility] [phone]. This is not an outpatient service. Post hospital discharge, please contact hospital directly.

## 2022-02-01 NOTE — ED Notes (Signed)
Delay in activating Code Stroke on Tele[facility] due to Use of Cart for another Patient.

## 2022-02-01 NOTE — ED Notes (Signed)
Verbal to bypass labs given by Dr. Kathrynn Humble. @ 10:10pm.   Codestroke emergent patient. = Angio Head/Neck

## 2022-02-02 ENCOUNTER — Observation Stay (HOSPITAL_COMMUNITY): Payer: 59

## 2022-02-02 DIAGNOSIS — M4722 Other spondylosis with radiculopathy, cervical region: Secondary | ICD-10-CM

## 2022-02-02 DIAGNOSIS — R29818 Other symptoms and signs involving the nervous system: Secondary | ICD-10-CM | POA: Diagnosis not present

## 2022-02-02 DIAGNOSIS — F8081 Childhood onset fluency disorder: Secondary | ICD-10-CM

## 2022-02-02 DIAGNOSIS — R42 Dizziness and giddiness: Secondary | ICD-10-CM

## 2022-02-02 DIAGNOSIS — Z20822 Contact with and (suspected) exposure to covid-19: Secondary | ICD-10-CM | POA: Diagnosis not present

## 2022-02-02 DIAGNOSIS — R946 Abnormal results of thyroid function studies: Secondary | ICD-10-CM | POA: Diagnosis not present

## 2022-02-02 DIAGNOSIS — I629 Nontraumatic intracranial hemorrhage, unspecified: Secondary | ICD-10-CM | POA: Diagnosis not present

## 2022-02-02 DIAGNOSIS — R531 Weakness: Secondary | ICD-10-CM | POA: Diagnosis not present

## 2022-02-02 DIAGNOSIS — J4540 Moderate persistent asthma, uncomplicated: Secondary | ICD-10-CM

## 2022-02-02 DIAGNOSIS — F985 Adult onset fluency disorder: Secondary | ICD-10-CM | POA: Diagnosis not present

## 2022-02-02 DIAGNOSIS — M069 Rheumatoid arthritis, unspecified: Secondary | ICD-10-CM

## 2022-02-02 DIAGNOSIS — Z79899 Other long term (current) drug therapy: Secondary | ICD-10-CM | POA: Diagnosis not present

## 2022-02-02 DIAGNOSIS — R569 Unspecified convulsions: Secondary | ICD-10-CM

## 2022-02-02 DIAGNOSIS — L209 Atopic dermatitis, unspecified: Secondary | ICD-10-CM | POA: Diagnosis not present

## 2022-02-02 DIAGNOSIS — R69 Illness, unspecified: Secondary | ICD-10-CM | POA: Diagnosis not present

## 2022-02-02 HISTORY — DX: Other spondylosis with radiculopathy, cervical region: M47.22

## 2022-02-02 HISTORY — DX: Rheumatoid arthritis, unspecified: M06.9

## 2022-02-02 LAB — CBC
HCT: 41 % (ref 36.0–46.0)
Hemoglobin: 13.7 g/dL (ref 12.0–15.0)
MCH: 28.7 pg (ref 26.0–34.0)
MCHC: 33.4 g/dL (ref 30.0–36.0)
MCV: 85.8 fL (ref 80.0–100.0)
Platelets: 292 10*3/uL (ref 150–400)
RBC: 4.78 MIL/uL (ref 3.87–5.11)
RDW: 13.1 % (ref 11.5–15.5)
WBC: 8.6 10*3/uL (ref 4.0–10.5)
nRBC: 0 % (ref 0.0–0.2)

## 2022-02-02 LAB — HIV ANTIBODY (ROUTINE TESTING W REFLEX): HIV Screen 4th Generation wRfx: NONREACTIVE

## 2022-02-02 LAB — RESP PANEL BY RT-PCR (FLU A&B, COVID) ARPGX2
Influenza A by PCR: NEGATIVE
Influenza B by PCR: NEGATIVE
SARS Coronavirus 2 by RT PCR: NEGATIVE

## 2022-02-02 LAB — HEMOGLOBIN A1C
Hgb A1c MFr Bld: 5.4 % (ref 4.8–5.6)
Mean Plasma Glucose: 108.28 mg/dL

## 2022-02-02 LAB — CREATININE, SERUM
Creatinine, Ser: 0.94 mg/dL (ref 0.44–1.00)
GFR, Estimated: >60 mL/min (ref >60)

## 2022-02-02 MED ORDER — FLUTICASONE PROPIONATE 50 MCG/ACT NA SUSP
1.0000 | Freq: Every day | NASAL | Status: DC
  Administered 2022-02-03: 1 via NASAL
  Filled 2022-02-02: qty 16

## 2022-02-02 MED ORDER — PANTOPRAZOLE SODIUM 40 MG PO TBEC
40.0000 mg | DELAYED_RELEASE_TABLET | Freq: Every day | ORAL | Status: DC
  Filled 2022-02-02: qty 1

## 2022-02-02 MED ORDER — ENOXAPARIN SODIUM 40 MG/0.4ML IJ SOSY
40.0000 mg | PREFILLED_SYRINGE | INTRAMUSCULAR | Status: DC
  Administered 2022-02-02: 40 mg via SUBCUTANEOUS
  Filled 2022-02-02: qty 0.4

## 2022-02-02 MED ORDER — CELECOXIB 100 MG PO CAPS
100.0000 mg | ORAL_CAPSULE | Freq: Two times a day (BID) | ORAL | Status: DC
  Administered 2022-02-03: 100 mg via ORAL
  Filled 2022-02-02 (×5): qty 1

## 2022-02-02 MED ORDER — ASPIRIN 81 MG PO CHEW
81.0000 mg | CHEWABLE_TABLET | Freq: Once | ORAL | Status: AC
Start: 2022-02-02 — End: 2022-02-02
  Administered 2022-02-02: 81 mg via ORAL
  Filled 2022-02-02: qty 1

## 2022-02-02 MED ORDER — BUDESON-GLYCOPYRROL-FORMOTEROL 160-9-4.8 MCG/ACT IN AERO: 2.0000 | INHALATION_SPRAY | Freq: Two times a day (BID) | RESPIRATORY_TRACT | Status: DC

## 2022-02-02 MED ORDER — GABAPENTIN 300 MG PO CAPS
300.0000 mg | ORAL_CAPSULE | Freq: Three times a day (TID) | ORAL | Status: DC
  Administered 2022-02-02 – 2022-02-03 (×3): 300 mg via ORAL
  Filled 2022-02-02 (×4): qty 1

## 2022-02-02 MED ORDER — ACETAMINOPHEN 650 MG RE SUPP: 650.0000 mg | RECTAL | Status: DC | PRN

## 2022-02-02 MED ORDER — ACETAMINOPHEN 160 MG/5ML PO SOLN: 650.0000 mg | ORAL | Status: DC | PRN

## 2022-02-02 MED ORDER — TRAMADOL HCL 50 MG PO TABS
100.0000 mg | ORAL_TABLET | ORAL | Status: DC
  Administered 2022-02-02: 100 mg via ORAL
  Filled 2022-02-02: qty 2

## 2022-02-02 MED ORDER — STROKE: EARLY STAGES OF RECOVERY BOOK
Freq: Once | Status: AC
  Filled 2022-02-02: qty 1

## 2022-02-02 MED ORDER — ASPIRIN 81 MG PO CHEW: 81.0000 mg | CHEWABLE_TABLET | Freq: Every day | ORAL | Status: DC

## 2022-02-02 MED ORDER — ONDANSETRON HCL 4 MG/2ML IJ SOLN
4.0000 mg | Freq: Three times a day (TID) | INTRAMUSCULAR | Status: DC | PRN
  Administered 2022-02-02 – 2022-02-03 (×3): 4 mg via INTRAVENOUS
  Filled 2022-02-02 (×3): qty 2

## 2022-02-02 MED ORDER — MELATONIN 5 MG PO TABS: 5.0000 mg | ORAL_TABLET | Freq: Every evening | ORAL | Status: DC | PRN

## 2022-02-02 MED ORDER — HYDROXYCHLOROQUINE SULFATE 200 MG PO TABS
200.0000 mg | ORAL_TABLET | Freq: Two times a day (BID) | ORAL | Status: DC
  Administered 2022-02-02 – 2022-02-03 (×2): 200 mg via ORAL
  Filled 2022-02-02 (×5): qty 1

## 2022-02-02 MED ORDER — GADOBUTROL 1 MMOL/ML IV SOLN
7.5000 mL | Freq: Once | INTRAVENOUS | Status: AC | PRN
  Administered 2022-02-02: 7.5 mL via INTRAVENOUS

## 2022-02-02 MED ORDER — PREDNISONE 20 MG PO TABS
20.0000 mg | ORAL_TABLET | Freq: Every day | ORAL | Status: DC
  Administered 2022-02-03: 20 mg via ORAL
  Filled 2022-02-02: qty 1

## 2022-02-02 MED ORDER — ORAL CARE MOUTH RINSE
15.0000 mL | OROMUCOSAL | Status: DC | PRN
Start: 2022-02-02 — End: 2022-02-03

## 2022-02-02 MED ORDER — ALBUTEROL SULFATE (2.5 MG/3ML) 0.083% IN NEBU: 2.5000 mg | INHALATION_SOLUTION | RESPIRATORY_TRACT | Status: DC | PRN

## 2022-02-02 MED ORDER — LORATADINE 10 MG PO TABS
10.0000 mg | ORAL_TABLET | Freq: Every day | ORAL | Status: DC
  Administered 2022-02-03: 10 mg via ORAL
  Filled 2022-02-02: qty 1

## 2022-02-02 MED ORDER — SODIUM CHLORIDE 0.9 % IV SOLN: INTRAVENOUS | Status: DC

## 2022-02-02 MED ORDER — FLUTICASONE FUROATE-VILANTEROL 200-25 MCG/ACT IN AEPB
1.0000 | INHALATION_SPRAY | Freq: Every day | RESPIRATORY_TRACT | Status: DC
  Administered 2022-02-03: 1 via RESPIRATORY_TRACT
  Filled 2022-02-02: qty 28

## 2022-02-02 MED ORDER — UMECLIDINIUM BROMIDE 62.5 MCG/ACT IN AEPB
1.0000 | INHALATION_SPRAY | Freq: Every day | RESPIRATORY_TRACT | Status: DC
  Administered 2022-02-03: 1 via RESPIRATORY_TRACT
  Filled 2022-02-02: qty 7

## 2022-02-02 MED ORDER — DIAZEPAM 5 MG/ML IJ SOLN
2.5000 mg | Freq: Once | INTRAMUSCULAR | Status: AC
  Administered 2022-02-02: 2.5 mg via INTRAVENOUS
  Filled 2022-02-02: qty 2

## 2022-02-02 MED ORDER — SENNOSIDES-DOCUSATE SODIUM 8.6-50 MG PO TABS: 1.0000 | ORAL_TABLET | Freq: Every evening | ORAL | Status: DC | PRN

## 2022-02-02 MED ORDER — ACETAMINOPHEN 325 MG PO TABS: 650.0000 mg | ORAL_TABLET | ORAL | Status: DC | PRN

## 2022-02-02 NOTE — Progress Notes (Signed)
2222 activated 2226 paged [place] on screen 0119 all imaging relayed (delay in read)

## 2022-02-02 NOTE — H&P (Signed)
History and Physical    DOA: 02/01/2022  PCP: Josetta Huddle, MD  Patient coming from: Home  Chief Complaint: Stuttering speech  HPI: Anne Jefferson is a 46 y.o. 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  Patient states she has had intermittent mild stuttering speech in the past but has not been persistent or frequent like now.  Her 70 year old son who is at bedside reports that this is new and he is barely noticed patient stuttering in the past.  Patient also reports being diagnosed with "spectrum of rheumatoid arthritis" for couple of years now for which she follows a rheumatologist and being on chloroquine.  Patient apparently was prescribed prednisone taper last week (starting at 40 mg) by her rheumatologist given joint pain flare (mostly along palms and feet).  She reports having neck pain at 1 point associated with numbness along the right upper extremity and was diagnosed with "arthritis or something in my neck" and was prescribed gabapentin which she takes 300 mg 3 times a day.  She states recently she was advised to go up to 400 mg but she has not started taking the higher dose yet.  She also reports dizzy spells intermittently but she has had headache and dizziness since this morning .  She denies any spinning sensation and describes more as lightheadedness.  She is asking if she can take her home medication-Celebrex for headache.  Patient and son at bedside deny any new facial asymmetry or other focal weakness/new numbness. ED course: [facility] where her vital signs were stable, afebrile.  BP fluctuating between systolic 1 24-0 70.  CT head unremarkable.  Patient was evaluated by teleneurology and recommended medical admission/neurology work-up including MRI and EEG.   Review of Systems: As per HPI, otherwise review  of systems negative.    Past Medical History:  Diagnosis Date   Abnormal Pap smear    Anxiety    no meds   Asthma    mild, no issue for years no  inhaler use except in winter   Biliary colic    Blood type, Rh negative    Gallstones    H/O dysmenorrhea    Infertility, female    Nausea and vomiting    Pain    back and chest realted to gallbladder since november 2018   PONV (postoperative nausea and vomiting)    severe, needs heavy anti nausea meds   Spinal stenosis     Past Surgical History:  Procedure Laterality Date    c sections     x 2  3 births last was twins   BUNIONECTOMY Right    CHOLECYSTECTOMY N/A 11/22/2017   Procedure: [place];  Surgeon: Mickeal Skinner, MD;  Location: [facility];  Service: General;  Laterality: N/A;   LEEP  12/23/2009   WISDOM TOOTH EXTRACTION      Social history:  reports that she has never smoked. She has never used smokeless tobacco. She reports current alcohol use. She reports that she does not use drugs.   Allergies  Allergen Reactions   Promethazine Other (See Comments)    Hallucinations   Theophyllines Nausea And Vomiting   Mite (D. Farinae) Other (See Comments)    Cats, dogs, dust    Molds & Smuts Other (See Comments)    Family History  Problem Relation Age of Onset   Allergic  rhinitis Mother    Osteoarthritis Mother    Healthy Brother    Healthy Brother    Diabetes Maternal Grandmother    Skin cancer Maternal Grandmother    Celiac disease Son    Autoimmune disease Son    Anemia Son    Diabetes Son        borderline      Prior to Admission medications   Medication Sig Start Date End Date Taking? Authorizing Provider  celecoxib (CELEBREX) 100 MG capsule Take 100 mg by mouth 2 (two) times daily.   Yes [provider]  fexofenadine (ALLEGRA) 180 MG tablet Take 180 mg by mouth 2 (two) times daily.   Yes [provider]  gabapentin (NEURONTIN) 300 MG capsule Take  300 mg by mouth 3 (three) times daily.   Yes [provider]  hydroxychloroquine (PLAQUENIL) 200 MG tablet Take 200 mg by mouth 2 (two) times daily.   Yes [provider]  mometasone (NASONEX) 50 MCG/ACT nasal spray Place 1 spray into the nose daily. Patient taking differently: Place 1 spray into the nose 2 (two) times daily. 07/29/20  Yes Garnet Sierras, DO  traMADol (ULTRAM) 50 MG tablet Take 100 mg by mouth 1 day or 1 dose. 03/05/21  Yes [provider]  albuterol (PROVENTIL HFA) 108 (90 Base) MCG/ACT inhaler Inhale 2 puffs into the lungs every 4 (four) hours as needed for wheezing or shortness of breath. 10/20/21   Dara Hoyer, FNP  albuterol (PROVENTIL) (2.5 MG/3ML) 0.083% nebulizer solution Use 1 unit dose via the nebulizer every 6 hours as needed for cough, wheeze, tightness in chest, or shortness of breath. 05/27/21   Althea Charon, FNP  BREZTRI AEROSPHERE 160-9-4.8 MCG/ACT AERO Inhale 2 puffs into the lungs in the morning and at bedtime. 10/20/21   Dara Hoyer, FNP  EPINEPHrine (AUVI-Q) 0.3 mg/0.3 mL IJ SOAJ injection Inject 0.3 mg into the muscle as needed for anaphylaxis. 07/29/20   Garnet Sierras, DO  omeprazole (PRILOSEC OTC) 20 MG tablet Take 1 tablet (20 mg total) by mouth daily. 05/27/21   Althea Charon, FNP  UNABLE TO FIND 2 (two) times a week. Med Name: ALLERGY SHOTS    [provider]  VITAMIN D PO Take by mouth daily.    [provider]    Physical Exam: Vitals:   02/02/22 0800 02/02/22 0900 02/02/22 1200 02/02/22 1341  BP: (!) 178/116 (!) 139/94 (!) 131/91 120/87  Pulse: 77 79 86 76  Resp: 20 20 (!) 25 16  Temp:    (!) 97.5 F (36.4 C)  TempSrc:    Oral  SpO2: 98% 97% 98% 98%  Weight:      Height:        Constitutional: NAD, calm, comfortable.  Speech noted to be whispering or stuttering intermittently.  No facial droop noted. Eyes: PERRL, lids and conjunctivae normal ENMT: Mucous membranes are moist. Posterior pharynx clear of  any exudate or lesions.Normal dentition.  Neck: normal, supple, no masses, no thyromegaly Respiratory: clear to auscultation bilaterally, no wheezing, no crackles. Normal respiratory effort. No accessory muscle use.  Cardiovascular: Regular rate and rhythm, no murmurs / rubs / gallops. No extremity edema. 2+ pedal pulses. No carotid bruits.  Abdomen: no tenderness, no masses palpated. No hepatosplenomegaly. Bowel sounds positive.  Musculoskeletal: no clubbing / cyanosis. No joint deformity upper and lower extremities. Good ROM, no contractures. Normal muscle tone.  Neurologic: Intermittent stuttering speech noted, CN 2-12 grossly intact.  Reduced  sensation to crude touch along medial aspect of right arm/forearm and palm, DTR normal. Strength 5/5 in all 4.  Psychiatric: Normal judgment and insight. Alert and oriented x 3. Normal mood.  SKIN/catheters: no rashes, lesions, ulcers. No induration  Labs on Admission: I have personally reviewed following labs and imaging studies  CBC: Recent Labs  Lab 02/01/22 2201 02/02/22 1412  WBC 12.1* 8.6  NEUTROABS 9.1*  --   HGB 13.7 13.7  HCT 41.8 41.0  MCV 87.8 85.8  PLT 336 009   Basic Metabolic Panel: Recent Labs  Lab 02/01/22 2201  NA 136  K 3.9  CL 98  CO2 28  GLUCOSE 186*  BUN 18  CREATININE 0.75  CALCIUM 9.9   GFR: Estimated Creatinine Clearance: 84.6 mL/min (by C-G formula based on SCr of 0.75 mg/dL). Recent Labs  Lab 02/01/22 2201 02/02/22 1412  WBC 12.1* 8.6   Liver Function Tests: Recent Labs  Lab 02/01/22 2201  AST 14*  ALT 16  ALKPHOS 46  BILITOT 0.4  PROT 7.2  ALBUMIN 4.6   No results for input(s): "LIPASE", "AMYLASE" in the last 168 hours. No results for input(s): "AMMONIA" in the last 168 hours. Coagulation Profile: Recent Labs  Lab 02/01/22 2201  INR 1.0   Cardiac Enzymes: No results for input(s): "CKTOTAL", "CKMB", "CKMBINDEX", "TROPONINI" in the last 168 hours. BNP (last 3 results) No results for  input(s): "PROBNP" in the last 8760 hours. HbA1C: No results for input(s): "HGBA1C" in the last 72 hours. CBG: No results for input(s): "GLUCAP" in the last 168 hours. Lipid Profile: No results for input(s): "CHOL", "HDL", "LDLCALC", "TRIG", "CHOLHDL", "LDLDIRECT" in the last 72 hours. Thyroid Function Tests: No results for input(s): "TSH", "T4TOTAL", "FREET4", "T3FREE", "THYROIDAB" in the last 72 hours. Anemia Panel: No results for input(s): "VITAMINB12", "FOLATE", "FERRITIN", "TIBC", "IRON", "RETICCTPCT" in the last 72 hours. Urine analysis:    Component Value Date/Time   COLORURINE YELLOW 02/01/2022 2203   APPEARANCEUR CLEAR 02/01/2022 2203   LABSPEC 1.024 02/01/2022 2203   PHURINE 5.0 02/01/2022 2203   GLUCOSEU NEGATIVE 02/01/2022 2203   HGBUR NEGATIVE 02/01/2022 2203   BILIRUBINUR NEGATIVE 02/01/2022 2203   KETONESUR NEGATIVE 02/01/2022 2203   PROTEINUR TRACE (A) 02/01/2022 2203   NITRITE NEGATIVE 02/01/2022 2203   LEUKOCYTESUR NEGATIVE 02/01/2022 2203    Radiological Exams on Admission: Personally reviewed  CT HEAD CODE STROKE WO CONTRAST  Result Date: 02/01/2022 CLINICAL DATA:  Initial evaluation for neuro deficit, stroke suspected. EXAM: CT ANGIOGRAPHY HEAD AND NECK TECHNIQUE: Multidetector CT imaging of the head and neck was performed using the standard protocol during bolus administration of intravenous contrast. Multiplanar CT image reconstructions and MIPs were obtained to evaluate the vascular anatomy. Carotid stenosis measurements (when applicable) are obtained utilizing NASCET criteria, using the distal internal carotid diameter as the denominator. RADIATION DOSE REDUCTION: This exam was performed according to the departmental dose-optimization program which includes automated exposure control, adjustment of the mA and/or kV according to patient size and/or use of iterative reconstruction technique. CONTRAST:  22m OMNIPAQUE IOHEXOL 350 MG/ML SOLN COMPARISON:  Prior study  from 09/01/2020. FINDINGS: CT HEAD FINDINGS Brain: Cerebral volume within normal limits. No acute intracranial hemorrhage. No acute large vessel territory infarct. No mass lesion or midline shift. No hydrocephalus or extra-axial fluid collection. Vascular: No hyperdense vessel. Skull: Scalp soft tissues and calvarium within normal limits. Sinuses/Orbits: Globes orbital soft tissues demonstrate no acute finding. Small air-fluid level noted within the left sphenoid sinus. Paranasal sinuses are  otherwise clear. No mastoid effusion. Other: None. ASPECTS ([place] Stroke Program Early CT Score) - Ganglionic level infarction (caudate, lentiform nuclei, internal capsule, insula, M1-M3 cortex): 7 - Supraganglionic infarction (M4-M6 cortex): 3 Total score (0-10 with 10 being normal): 10 CTA NECK FINDINGS Aortic arch: Visualized aortic arch normal caliber with standard branching pattern. No stenosis or other abnormality about the origin the great vessels. Right carotid system: Right common and internal carotid arteries widely patent without stenosis, dissection or occlusion. Left carotid system: Left common and internal carotid arteries widely patent without stenosis, dissection or occlusion. Vertebral arteries: Both vertebral arteries arise from the subclavian arteries. No proximal subclavian artery stenosis. Proximal left vertebral artery not well seen due to adjacent venous contamination. Visualized portions of the vertebral arteries patent without stenosis or dissection. Skeleton: No discrete or worrisome osseous lesions. Other neck: No other acute soft tissue abnormality within the neck. Upper chest: Visualized upper chest demonstrates no acute finding. Review of the MIP images confirms the above findings CTA HEAD FINDINGS Anterior circulation: Both internal carotid arteries widely patent to the termini without stenosis. A1 segments widely patent. Normal anterior communicating artery complex. Both anterior cerebral  arteries widely patent to their distal aspects without stenosis. No M1 stenosis or occlusion. Normal MCA bifurcations. Distal MCA branches well perfused and symmetric. Posterior circulation: Both V4 segments patent to the vertebrobasilar junction without stenosis. Both PICA origins patent and normal. Basilar widely patent to its distal aspect without stenosis. Superior cerebellar arteries patent bilaterally. Both PCAs primarily supplied via the basilar and are well perfused to there distal aspects. Venous sinuses: Grossly patent allowing for timing the contrast bolus. Anatomic variants: None significant.  No aneurysm. Review of the MIP images confirms the above findings IMPRESSION: CT HEAD IMPRESSION: 1. Negative head CT.  No acute intracranial abnormality. 2. Aspects equals 10. 3. Mild left sphenoid sinusitis. CTA HEAD AND NECK IMPRESSION: Normal CTA of the head and neck. No large vessel occlusion, hemodynamically significant stenosis, or other acute vascular abnormality. Critical Value/emergent results were called by telephone at the time of interpretation on 02/01/2022 at 10:42 pm to provider [facility] , who verbally acknowledged these results. Electronically Signed   By: Jeannine Boga M.D.   On: 02/01/2022 22:49   CT ANGIO HEAD NECK W WO CM  Result Date: 02/01/2022 CLINICAL DATA:  Initial evaluation for neuro deficit, stroke suspected. EXAM: CT ANGIOGRAPHY HEAD AND NECK TECHNIQUE: Multidetector CT imaging of the head and neck was performed using the standard protocol during bolus administration of intravenous contrast. Multiplanar CT image reconstructions and MIPs were obtained to evaluate the vascular anatomy. Carotid stenosis measurements (when applicable) are obtained utilizing NASCET criteria, using the distal internal carotid diameter as the denominator. RADIATION DOSE REDUCTION: This exam was performed according to the departmental dose-optimization program which includes automated exposure  control, adjustment of the mA and/or kV according to patient size and/or use of iterative reconstruction technique. CONTRAST:  9m OMNIPAQUE IOHEXOL 350 MG/ML SOLN COMPARISON:  Prior study from 09/01/2020. FINDINGS: CT HEAD FINDINGS Brain: Cerebral volume within normal limits. No acute intracranial hemorrhage. No acute large vessel territory infarct. No mass lesion or midline shift. No hydrocephalus or extra-axial fluid collection. Vascular: No hyperdense vessel. Skull: Scalp soft tissues and calvarium within normal limits. Sinuses/Orbits: Globes orbital soft tissues demonstrate no acute finding. Small air-fluid level noted within the left sphenoid sinus. Paranasal sinuses are otherwise clear. No mastoid effusion. Other: None. ASPECTS [facility] - Ganglionic level infarction (  caudate, lentiform nuclei, internal capsule, insula, M1-M3 cortex): 7 - Supraganglionic infarction (M4-M6 cortex): 3 Total score (0-10 with 10 being normal): 10 CTA NECK FINDINGS Aortic arch: Visualized aortic arch normal caliber with standard branching pattern. No stenosis or other abnormality about the origin the great vessels. Right carotid system: Right common and internal carotid arteries widely patent without stenosis, dissection or occlusion. Left carotid system: Left common and internal carotid arteries widely patent without stenosis, dissection or occlusion. Vertebral arteries: Both vertebral arteries arise from the subclavian arteries. No proximal subclavian artery stenosis. Proximal left vertebral artery not well seen due to adjacent venous contamination. Visualized portions of the vertebral arteries patent without stenosis or dissection. Skeleton: No discrete or worrisome osseous lesions. Other neck: No other acute soft tissue abnormality within the neck. Upper chest: Visualized upper chest demonstrates no acute finding. Review of the MIP images confirms the above findings CTA HEAD FINDINGS Anterior  circulation: Both internal carotid arteries widely patent to the termini without stenosis. A1 segments widely patent. Normal anterior communicating artery complex. Both anterior cerebral arteries widely patent to their distal aspects without stenosis. No M1 stenosis or occlusion. Normal MCA bifurcations. Distal MCA branches well perfused and symmetric. Posterior circulation: Both V4 segments patent to the vertebrobasilar junction without stenosis. Both PICA origins patent and normal. Basilar widely patent to its distal aspect without stenosis. Superior cerebellar arteries patent bilaterally. Both PCAs primarily supplied via the basilar and are well perfused to there distal aspects. Venous sinuses: Grossly patent allowing for timing the contrast bolus. Anatomic variants: None significant.  No aneurysm. Review of the MIP images confirms the above findings IMPRESSION: CT HEAD IMPRESSION: 1. Negative head CT.  No acute intracranial abnormality. 2. Aspects equals 10. 3. Mild left sphenoid sinusitis. CTA HEAD AND NECK IMPRESSION: Normal CTA of the head and neck. No large vessel occlusion, hemodynamically significant stenosis, or other acute vascular abnormality. Critical Value/emergent results were called by telephone at the time of interpretation on 02/01/2022 at 10:42 pm to provider [facility] , who verbally acknowledged these results. Electronically Signed   By: Jeannine Boga M.D.   On: 02/01/2022 22:49    EKG: Independently reviewed. NSR, no acute ST-T changes     Assessment and Plan:   Principal Problem:   Stutterings Active Problems:   Cervical spondylosis with radiculopathy   Rheumatoid arthritis (HCC)   H/O dysmenorrhea   Atopic dermatitis   Moderate persistent asthma without complication   History of nasal polyposis    1.Dysarthria: Patient does not have any associated dysphagia or facial droop.  CT head unremarkable.  She does have some chronic component.  Transferred from  [facility] ED to complete neurology work-up with MRI/EEG.  She has been evaluated by speech therapy today and cleared for regular diet.  I did discuss with neurology on-call, Dr. Bronson Curb will evaluate patient and order MRI (with or without contrast) as deemed necessary.  EEG ordered.  Continue neurochecks, PT/OT evaluation and DVT prophylaxis.  81 mg aspirin until stroke ruled out.  2.  Headache/dizzy spell: Noted that BP was elevated on presentation but improved now.  MRI to rule out stroke.  Check orthostatic vital signs.  Denies vertigo.  No nystagmus on exam.  3.  Chronic joint pains-rheumatoid arthritis: Patient states she was told to be on the spectrum of rheumatoid arthritis due to personal and family history of autoimmune disease and elevated inflammatory markers.  She states her complaint was mostly joint pains when seen by rheumatology and has  been prescribed hydrochloroquine and more recently prednisone taper.  Will resume prednisone 20 mg that she was taking until yesterday per prescribed taper  4.  Chronic neck pain/right upper extremity numbness: Resume home medication Celebrex and gabapentin.  Neurology to evaluate and recommend if gabapentin dosage needs to be adjusted.  5.  Anxiety disorder: Patient not on any anxiolytics other than gabapentin.  She is, however, requesting premedication for MRI.  Will order low-dose Valium  6.  Mild persistent asthma: Resume home medications  7. ?GERD: Resume home medication PPI especially in the setting of NSAID/prednisone and now aspirin use.  DVT prophylaxis: Lovenox  Code Status: Full code   .Health care proxy would be her parents whose contact number patient stated she will provide.  She has 4 children-oldest is 22 but she prefers her parents to be healthcare decision makers in case she cannot make her own decisions.  Currently her 27 year old son at bedside.  Patient/Family Communication: Discussed with patient and all questions answered to  satisfaction.  Consults called: Neurology, discussed with Dr. Rory Percy Admission status :Patient will be admitted under OBSERVATION status.The patient's presenting symptoms, physical exam findings, and initial radiographic and laboratory data in the context of their medical condition is felt to place them at low risk for further clinical deterioration. Furthermore, it is anticipated that the patient will be medically stable for discharge from the hospital within 2 midnights of hospital stay.       Guilford Shi MD [facility] Pager in [place]  If 7PM-7AM, please contact night-coverage [email or web address]   02/02/2022, 2:34 PM

## 2022-02-02 NOTE — Evaluation (Signed)
Physical Therapy Evaluation Patient Details Name: Anne Jefferson MRN: [id number] DOB: Aug 05, 1975 Today's Date: 02/02/2022  History of Present Illness  Pt is a 46 female presenting with increased stutter. Pt was admitted 02/01/22 for OBV. CT on 7/11 negative, MRI pending. PMH includes asthma, anxiety, and cervicalgia.  Clinical Impression  Pt admitted with above diagnosis. PTA, pt was independent with all mobility and ADLs, lives at home with 4 children including a 52 yo son available to help 24/7. He was present during the session and supportive, able to corroborate information provided by pt. Upon PT eval, pt presented mod I for bed mobility and transfers, with min G-min A for ambulation with instances of LOB due to head turns or changes in direction requiring stability to prevent a fall. Pt tolerated 350 ft of ambulation, with c/o dizziness, lightheadedness, and nausea. Pt scored 12/24 on the DGI, indicating that pt is a fall risk. Pt would continue to benefit from PT for higher level balance training, and pt would benefit from a full vestibular evaluation while here. Will continue to follow acutely.      Recommendations for follow up therapy are one component of a multi-disciplinary discharge planning process, led by the attending physician.  Recommendations may be updated based on patient status, additional functional criteria and insurance authorization.  Follow Up Recommendations Outpatient PT (vestibular)      Assistance Recommended at Discharge PRN  Patient can return home with the following  A little help with walking and/or transfers;A little help with bathing/dressing/bathroom;Assistance with cooking/housework;Assist for transportation;Help with stairs or ramp for entrance    Equipment Recommendations None recommended by PT  Recommendations for Other Services  OT consult    Functional Status Assessment Patient has had a recent decline in their functional status and  demonstrates the ability to make significant improvements in function in a reasonable and predictable amount of time.     Precautions / Restrictions Precautions Precautions: Fall Restrictions Weight Bearing Restrictions: No      Mobility  Bed Mobility Overal bed mobility: Modified Independent             General bed mobility comments: increased time    Transfers Overall transfer level: Modified independent                 General transfer comment: increased time    Ambulation/Gait Ambulation/Gait assistance: Min guard, Min assist Gait Distance (Feet): 350 Feet Assistive device: None Gait Pattern/deviations: Step-through pattern, Decreased stride length, Drifts right/left, Staggering right, Staggering left, Narrow base of support Gait velocity: decreased Gait velocity interpretation: 1.31 - 2.62 ft/sec, indicative of limited community ambulator   General Gait Details: pt with slow and unsteady gait with instances of LOB and min A to correct balance.  Stairs Stairs: Yes Stairs assistance: Min guard Stair Management: One rail Left, Alternating pattern, Forwards Number of Stairs: 2 General stair comments: min G for safety, use of rail on L, alternating pattern, no increase in dizziness  Wheelchair Mobility    Modified Rankin (Stroke Patients Only)       Balance Overall balance assessment: Needs assistance Sitting-balance support: Feet supported, Feet unsupported Sitting balance-Leahy Scale: Good Sitting balance - Comments: pt able to bring both feet off ground during strength testing without LOB.   Standing balance support: No upper extremity supported Standing balance-Leahy Scale: Fair                   Standardized Balance Assessment Standardized Balance Assessment : Dynamic Gait Index  Dynamic Gait Index Level Surface: Mild Impairment Change in Gait Speed: Mild Impairment Gait with Horizontal Head Turns: Moderate Impairment Gait with  Vertical Head Turns: Moderate Impairment Gait and Pivot Turn: Moderate Impairment Step Over Obstacle: Moderate Impairment Step Around Obstacles: Mild Impairment Steps: Mild Impairment Total Score: 12       Pertinent Vitals/Pain Pain Assessment Pain Assessment: No/denies pain    Home Living Family/patient expects to be discharged to:: Private residence Living Arrangements: Children Available Help at Discharge: Family Type of Home: House Home Access: Stairs to enter   Technical brewer of Steps: 3   Home Layout: Two level;Able to live on main level with bedroom/bathroom Home Equipment: None      Prior Function Prior Level of Function : Independent/Modified Independent;Working/employed;Driving             Mobility Comments: independent, employed as Electrical engineer ADLs Comments: independent, takes care of children     Hand Dominance        Extremity/Trunk Assessment   Upper Extremity Assessment Upper Extremity Assessment: RUE deficits/detail;LUE deficits/detail RUE Deficits / Details: pt with difficulty approaching target in finger to nose. pt with increased time to process opposition instructions RUE Coordination: decreased fine motor LUE Deficits / Details: pt with difficulty approaching target in finger to nose (L>R). pt with increased time to process opposition instructions LUE Coordination: decreased fine motor    Lower Extremity Assessment Lower Extremity Assessment: LLE deficits/detail;RLE deficits/detail RLE Deficits / Details: 4+/5 for knee extension and flexion, df/pf RLE Coordination: WNL LLE Deficits / Details: 4+/5 for knee extension and flexion, df/pf LLE Coordination: WNL       Communication   Communication: Other (comment) (instances of stutter throughout eval)  Cognition Arousal/Alertness: Awake/alert Behavior During Therapy: WFL for tasks assessed/performed Overall Cognitive Status: Within Functional Limits for tasks assessed                                  General Comments: pt pleasant and agreeable to mobility        General Comments General comments (skin integrity, edema, etc.): noted R eye ptosis    Exercises     Assessment/Plan    PT Assessment Patient needs continued PT services  PT Problem List Decreased activity tolerance;Decreased strength;Decreased mobility;Decreased balance;Decreased coordination       PT Treatment Interventions Gait training;Stair training;Functional mobility training;Therapeutic activities;Therapeutic exercise;Balance training;Neuromuscular re-education;Patient/family education    PT Goals (Current goals can be found in the Care Plan section)  Acute Rehab PT Goals Patient Stated Goal: to go home, figure out origin of stutter PT Goal Formulation: With patient Time For Goal Achievement: 02/16/22 Potential to Achieve Goals: Good Additional Goals Additional Goal #1: Pt will score >19/24 on DGI to indicate decreased risk for falls.    Frequency Min 4X/week     Co-evaluation               AM-PAC PT "6 Clicks" Mobility  Outcome Measure Help needed turning from your back to your side while in a flat bed without using bedrails?: None Help needed moving from lying on your back to sitting on the side of a flat bed without using bedrails?: None Help needed moving to and from a bed to a chair (including a wheelchair)?: None Help needed standing up from a chair using your arms (e.g., wheelchair or bedside chair)?: None Help needed to walk in hospital room?: A Little Help needed climbing  3-5 steps with a railing? : A Little 6 Click Score: 22    End of Session Equipment Utilized During Treatment: Gait belt Activity Tolerance: Patient tolerated treatment well Patient left: in bed;with call bell/phone within reach;with family/visitor present Nurse Communication: Mobility status PT Visit Diagnosis: Unsteadiness on feet (R26.81);Other abnormalities of gait and  mobility (R26.89);Dizziness and giddiness (R42)    Time: 1410-1435 PT Time Calculation (min) (ACUTE ONLY): 25 min   Charges:   PT Evaluation $PT Eval Moderate Complexity: 1 Mod PT Treatments $Gait Training: 8-22 mins        Havery Moros, MS, [place] [facility] Office: [phone]  Havery Moros 02/02/2022, 3:27 PM

## 2022-02-02 NOTE — Progress Notes (Signed)
   02/02/22 1548 02/02/22 1551 02/02/22 1553  Vitals  BP 115/79 (!) 120/94 117/90  MAP (mmHg) 90 102 98  BP Location Right Arm  --   --   BP Method Automatic Automatic Automatic  Patient Position (if appropriate) Lying Sitting Standing  Pulse Rate 73 81 85  Pulse Rate Source Monitor Monitor Monitor  Resp 20  --   --   MEWS COLOR  MEWS Score Color Anne Jefferson Green  Oxygen Therapy  SpO2 96 % 95 % 98 %  O2 Device Room Air  --   --     Orthostatic VS as above

## 2022-02-02 NOTE — Progress Notes (Signed)
SLP Cancellation Note  Patient Details Name: Anne Jefferson MRN: [id number] DOB: 20-Nov-1975   Cancelled treatment:       Reason Eval/Treat Not Completed: Patient at procedure or test/unavailable  Orders received for BSE - RN reports pt passed [facility] swallow screen, and was tolerating PO trials at [employer]. Please reconsult if needs arise for swallowing issues.   Orders received for cognitive-communication evaluation. Pt currently unavailable. Will continue efforts.   Kamel Haven B. Quentin Ore, [facility], [place] Speech Language Pathologist Office: [phone]  Shonna Chock 02/02/2022, 2:14 PM

## 2022-02-02 NOTE — Procedures (Signed)
Patient Name: Anne Jefferson  MRN: [id number]  Epilepsy Attending: Lora Havens  Referring Physician/Provider: Guilford Shi, MD  Date: 02/03/2022 Duration: 26.27 mins  Patient history: 46 year old female with sudden onset of dizziness and slurred speech.  EEG evaluate for seizure  Level of alertness: Awake  AEDs during EEG study: GBP  Technical aspects: This EEG study was done with scalp electrodes positioned according to the 10-20 International system of electrode placement. Electrical activity was acquired at a sampling rate of '500Hz'$  and reviewed with a high frequency filter of '70Hz'$  and a low frequency filter of '1Hz'$ . EEG data were recorded continuously and digitally stored.   Description: The posterior dominant rhythm consists of 9 Hz activity of moderate voltage (25-35 uV) seen predominantly in posterior head regions, symmetric and reactive to eye opening and eye closing.  Physiologic photic driving was not seen during photic stimulation.  Hyperventilation was not performed.     IMPRESSION: This study is within normal limits. No seizures or epileptiform discharges were seen throughout the recording.  Arabell Neria Barbra Sarks

## 2022-02-03 ENCOUNTER — Observation Stay (HOSPITAL_COMMUNITY): Payer: 59

## 2022-02-03 ENCOUNTER — Observation Stay (HOSPITAL_BASED_OUTPATIENT_CLINIC_OR_DEPARTMENT_OTHER): Payer: 59

## 2022-02-03 DIAGNOSIS — R42 Dizziness and giddiness: Secondary | ICD-10-CM

## 2022-02-03 DIAGNOSIS — R7989 Other specified abnormal findings of blood chemistry: Secondary | ICD-10-CM

## 2022-02-03 DIAGNOSIS — F419 Anxiety disorder, unspecified: Secondary | ICD-10-CM

## 2022-02-03 DIAGNOSIS — R69 Illness, unspecified: Secondary | ICD-10-CM | POA: Diagnosis not present

## 2022-02-03 DIAGNOSIS — E669 Obesity, unspecified: Secondary | ICD-10-CM

## 2022-02-03 DIAGNOSIS — G459 Transient cerebral ischemic attack, unspecified: Secondary | ICD-10-CM | POA: Diagnosis not present

## 2022-02-03 DIAGNOSIS — F985 Adult onset fluency disorder: Secondary | ICD-10-CM | POA: Diagnosis not present

## 2022-02-03 DIAGNOSIS — M069 Rheumatoid arthritis, unspecified: Secondary | ICD-10-CM | POA: Diagnosis not present

## 2022-02-03 HISTORY — DX: Morbid (severe) obesity due to excess calories: E66.01

## 2022-02-03 HISTORY — DX: Other specified abnormal findings of blood chemistry: R79.89

## 2022-02-03 HISTORY — DX: Dizziness and giddiness: R42

## 2022-02-03 LAB — ECHOCARDIOGRAM COMPLETE
Area-P 1/2: 4.49 cm2
Height: 63 in
S' Lateral: 3 cm
Weight: 2720 oz

## 2022-02-03 LAB — LIPID PANEL
Cholesterol: 171 mg/dL (ref 0–200)
HDL: 51 mg/dL (ref >40)
LDL Cholesterol: 88 mg/dL (ref 0–99)
Total CHOL/HDL Ratio: 3.4 RATIO
Triglycerides: 162 mg/dL — ABNORMAL HIGH (ref <150)
VLDL: 32 mg/dL (ref 0–40)

## 2022-02-03 LAB — TSH: TSH: 4.618 u[IU]/mL — ABNORMAL HIGH (ref 0.350–4.500)

## 2022-02-03 MED ORDER — PREDNISONE 20 MG PO TABS: ORAL_TABLET | ORAL | Status: DC

## 2022-02-03 MED ORDER — TRAMADOL HCL 50 MG PO TABS
100.0000 mg | ORAL_TABLET | ORAL | Status: DC
  Administered 2022-02-03: 100 mg via ORAL
  Filled 2022-02-03: qty 2

## 2022-02-03 MED ORDER — MECLIZINE HCL 25 MG PO TABS
25.0000 mg | ORAL_TABLET | Freq: Three times a day (TID) | ORAL | Status: DC | PRN
  Administered 2022-02-03: 25 mg via ORAL
  Filled 2022-02-03 (×2): qty 1

## 2022-02-03 MED ORDER — MECLIZINE HCL 25 MG PO TABS
25.0000 mg | ORAL_TABLET | Freq: Three times a day (TID) | ORAL | 0 refills | Status: DC | PRN
  Filled 2022-02-03: qty 30, 10d supply, fill #0

## 2022-02-03 MED ORDER — MECLIZINE HCL 12.5 MG PO TABS: 12.5000 mg | ORAL_TABLET | Freq: Three times a day (TID) | ORAL | Status: DC | PRN

## 2022-02-03 NOTE — Assessment & Plan Note (Signed)
Currently on steroid taper

## 2022-02-03 NOTE — Evaluation (Signed)
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   Rheumatoid arthritis ([place]) 02/02/2022   Stutterings 02/01/2022   History of nasal polyposis 11/17/2021   Acute cough 10/20/2021   Viral illness 10/20/2021   Moderate persistent asthma without complication 67/89/3810   Moderate persistent asthma with (acute) exacerbation 11/16/2020   Shooting pain 07/29/2020   Atopic dermatitis 04/01/2019   Perennial and seasonal allergic rhinitis 09/17/2018   Acute non-recurrent frontal sinusitis 09/17/2018   Infertility, female    Abnormal Pap smear    H/O dysmenorrhea    Blood type, Rh negative    METATARSALGIA 12/18/2007   BUNIONS, LEFT FOOT 12/18/2007   UNEQUAL LEG LENGTH 12/18/2007   SPRAIN&STRAIN OF UNSPECIFIED SITE OF KNEE&LEG 11/13/2007   Past Medical History:  Past Medical History:  Diagnosis Date   Abnormal Pap smear    Anxiety    no meds   Asthma    mild, no issue for years no  inhaler use except in winter   Biliary colic    Blood type, Rh negative    Gallstones    H/O dysmenorrhea    Infertility, female    Nausea and vomiting    Pain    back and chest realted to gallbladder since november 2018   PONV (postoperative nausea and vomiting)    severe, needs heavy anti nausea meds   Spinal stenosis    Past Surgical History:  Past Surgical History:  Procedure Laterality Date    c sections     x 2  3 births last was twins   BUNIONECTOMY Right    CHOLECYSTECTOMY N/A 11/22/2017   Procedure: [place];  Surgeon: Mickeal Skinner, MD;  Location: [facility] ORS;  Service: General;  Laterality: N/A;   LEEP  12/23/2009   WISDOM TOOTH EXTRACTION     HPI:  46yo female admitted  02/01/22 with dizziness and speech disfluency. PMH: possible RA, anxiety, biliary colic, hx intermittent stuttering and RU drift last 18 months. MRI negative for acute event.   Assessment / Plan / Recommendation Clinical Impression  Ms. Tinslee Hoffman-Dugger presents with specific speech disfluencies characterized by initial sound prolongations and single sound repetitions. The breaks in fluency appear to correlate with generalized sensation of "wooziness" or weakness.  There is no associated apraxia or aphasia. Oral mechanism is normal with no focal CN involvement. There are no phonatory breaks or variations in pitch.  She does not exhibit the classic secondary behaviors associated with stuttering.  Her features of dysfluency are not fully characteristic of a functional or psychogenic stuttering dx, as suggested in chart as a possibility during differential diagnosis. Specifically, there are no atypical patterns of disfluency or struggle; there is a low disfluency rate and there are islands of fluency; there are efforts to anticipate disfluency and prevent it by finding alternative words or pausing between productions. Pt may benefit from f/u OP SLP with a therapist who specializes in disfluency disorders. D/W pt who agrees with plan for OP consultation.  No further inpatient needs are identified.     SLP Assessment  SLP Recommendation/Assessment: All further Speech Lanaguage Pathology  needs can be addressed in the next venue of care SLP Visit Diagnosis: Other (comment) (disfluency)    Recommendations for follow up therapy are one  component of a multi-disciplinary discharge planning process, led by the attending physician.  Recommendations may be updated based on patient status, additional functional criteria and insurance authorization.    Follow Up Recommendations  Outpatient SLP    Assistance Recommended at Discharge  None                 SLP Evaluation Cognition  Overall Cognitive  Status: Within Functional Limits for tasks assessed       Comprehension  Auditory Comprehension Overall Auditory Comprehension: Appears within functional limits for tasks assessed Reading Comprehension Reading Status: Within funtional limits    Expression Expression Primary Mode of Expression: Verbal Verbal Expression Overall Verbal Expression: Appears within functional limits for tasks assessed Written Expression Dominant Hand: Right Written Expression: Not tested   Oral / Motor  Oral Motor/Sensory Function Overall Oral Motor/Sensory Function: Within functional limits Motor Speech Overall Motor Speech: Impaired Respiration: Within functional limits Phonation: Normal Resonance: Within functional limits Articulation: Impaired (speech disfluency) Level of Impairment: Sentence Intelligibility: Intelligible Motor Planning: Witnin functional limits            Juan Quam Laurice 02/03/2022, 1:11 PM   Hermila Millis L. Tivis Ringer, MA CCC/SLP Clinical Specialist - [place] Office number [phone]

## 2022-02-03 NOTE — Assessment & Plan Note (Signed)
Follow outaptient

## 2022-02-03 NOTE — TOC Transition Note (Signed)
Transition of Care [facility]) - CM/SW Discharge Note   Patient Details  Name: Anne Jefferson MRN: [id number] Date of Birth: Jan 16, 1976  [facility] CM/SW Contact:  Pollie Friar, RN Phone Number: 02/03/2022, 12:45 PM   Clinical Narrative:    Pt lives at home with her children. She drives self and manages her own medications without any issues.  She still works during the day.  Recommendations for outpatient therapy. She prefers to attend at [facility] outpatient therapy. Information in Epic and on the AVS.  Pt has transportation home once discharged.    Final next level of care: OP Rehab Barriers to Discharge: No Barriers Identified   Patient Goals and CMS Choice     Choice offered to / list presented to : Patient  Discharge Placement                       Discharge Plan and Services                                     Social Determinants of Health (SDOH) Interventions     Readmission Risk Interventions     No data to display

## 2022-02-03 NOTE — Progress Notes (Addendum)
STROKE TEAM PROGRESS NOTE   INTERVAL HISTORY Her partner  is at the bedside.  Patient is awake and alert sitting up in the bed. She is oriented x 4, no aphasia no dysarthria noted. Has stuttering speech at times, not fluent. Able to name and repeat. She tells me she has had this problem before and has been an ongoing problem for a couple of years now, however this time it has persisted longer than normal. She has no facial droop, no weakness. C/o dizziness/vertigo issues at times and right arm numbness   Vitals:   02/02/22 1553 02/02/22 2027 02/02/22 2318 02/03/22 0350  BP: 117/90 (!) 118/99 118/73 121/76  Pulse: 85 72 63 62  Resp:  '16 15 17  '$ Temp:  98.2 F (36.8 C) 98.2 F (36.8 C) 97.8 F (36.6 C)  TempSrc:  Oral Oral Oral  SpO2: 98% 97% 96% 97%  Weight:      Height:       CBC:  Recent Labs  Lab 02/01/22 2201 02/02/22 1412  WBC 12.1* 8.6  NEUTROABS 9.1*  --   HGB 13.7 13.7  HCT 41.8 41.0  MCV 87.8 85.8  PLT 336 818   Basic Metabolic Panel:  Recent Labs  Lab 02/01/22 2201 02/02/22 1412  NA 136  --   K 3.9  --   CL 98  --   CO2 28  --   GLUCOSE 186*  --   BUN 18  --   CREATININE 0.75 0.94  CALCIUM 9.9  --    Lipid Panel:  Recent Labs  Lab 02/03/22 0242  CHOL 171  TRIG 162*  HDL 51  CHOLHDL 3.4  VLDL 32  LDLCALC 88   HgbA1c:  Recent Labs  Lab 02/02/22 1412  HGBA1C 5.4   Urine Drug Screen:  Recent Labs  Lab 02/01/22 2203  LABOPIA NONE DETECTED  COCAINSCRNUR NONE DETECTED  LABBENZ NONE DETECTED  AMPHETMU NONE DETECTED  THCU NONE DETECTED  LABBARB NONE DETECTED    Alcohol Level  Recent Labs  Lab 02/01/22 2201  ETH <10    IMAGING past 24 hours MR BRAIN W WO CONTRAST  Result Date: 02/03/2022 CLINICAL DATA:  Initial evaluation for neuro deficit, stroke suspected. EXAM: MRI HEAD WITHOUT AND WITH CONTRAST TECHNIQUE: Multiplanar, multiecho pulse sequences of the brain and surrounding structures were obtained without and with intravenous  contrast. CONTRAST:  7.43m GADAVIST GADOBUTROL 1 MMOL/ML IV SOLN COMPARISON:  Prior CTs from 02/01/2022. FINDINGS: Brain: Cerebral volume within normal limits for patient age. No focal parenchymal signal abnormality identified. No abnormal foci of restricted diffusion to suggest acute or subacute ischemia. Gray-white matter differentiation well maintained. No encephalomalacia to suggest chronic infarction or other insult. No foci of susceptibility artifact to suggest acute or chronic intracranial hemorrhage. No mass lesion, midline shift or mass effect. No hydrocephalus. No extra-axial fluid collection. Pituitary gland and suprasellar region are normal. Midline structures intact and normal. No abnormal enhancement. Vascular: Major intracranial vascular flow voids are well maintained. Skull and upper cervical spine: Craniocervical junction normal. Visualized upper cervical spine within normal limits. Bone marrow signal intensity normal. No scalp soft tissue abnormality. Sinuses/Orbits: Globes and orbital soft tissues within normal limits. Small air-fluid level noted within the left sphenoid sinus. Paranasal sinuses are otherwise clear. No mastoid effusion. Other: None. IMPRESSION: Normal brain MRI. No acute intracranial infarct or other abnormality. Electronically Signed   By: BJeannine BogaM.D.   On: 02/03/2022 03:22    PHYSICAL EXAM  Temp:  [  97.8 F (36.6 C)-98.2 F (36.8 C)] 97.8 F (36.6 C) (07/14 0350) Pulse Rate:  [62-85] 62 (07/14 0350) Resp:  [15-20] 17 (07/14 0350) BP: (115-144)/(73-99) 134/90 (07/14 1005) SpO2:  [95 %-98 %] 97 % (07/14 0851)  General - Well nourished, well developed, in no apparent distress.  Ophthalmologic - fundi not visualized due to noncooperation.  Cardiovascular - Regular rhythm and rate.  Mental Status -  Level of arousal and orientation to time, place, and person were intact. no aphasia no dysarthria noted. Has stuttering speech at times, not fluent. Able  to name and repeat  Cranial Nerves II - XII - II - Visual field intact OU. III, IV, VI - Extraocular movements intact. V - Facial sensation intact bilaterally. VII - Facial movement intact bilaterally. VIII - Hearing & vestibular intact bilaterally. X - Palate elevates symmetrically. XI - Chin turning & shoulder shrug intact bilaterally. XII - Tongue protrusion intact.  Motor Strength - The patient's strength was normal in all extremities and pronator drift was absent.  Bulk was normal and fasciculations were absent.   Motor Tone - Muscle tone was assessed at the neck and appendages and was normal.  Sensory - Light touch, temperature/pinprick were assessed and were symmetrical.    Coordination - The patient had normal movements in the hands and feet with no ataxia or dysmetria.  Tremor was absent.  Gait and Station - deferred.   ASSESSMENT/PLAN [name] is a 46 y.o. 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  Patient states she has had intermittent mild stuttering speech in the past but has not been persistent or frequent like now.    Stuttering speech  Etiology:  likely anxiety vs. Panic attack vs. Steroid use Code Stroke CT head No acute abnormality. ASPECTS 10.    CTA head & neck Normal CTA of the head and neck. No large vessel occlusion, hemodynamically significant stenosis, or other acute vascular abnormality. MRI  Normal brain MRI. No acute intracranial infarct or other abnormality. 2D Echo EF 60-65% EEG pending  LDL 88 HgbA1c 5.4 VTE prophylaxis - Lovenox/SCD's No antithrombotic prior to admission, now on No antithrombotic needed from neuro standpoint. Pt denies stress, anxiety or panic, not interested in psych evaluation Therapy recommendations:  Outpatient PT/OT/ST Disposition:  home  Lipid management Home meds:   none LDL 88, goal < 100 High intensity statin not indicated   RA On plaquenil  Pulse steroid treatment in the past Started prednisone '40mg'$  a week ago and now on '20mg'$ , plan to taper off in one week  Other Stroke Risk Factors Obesity, Body mass index is 30.11 kg/m., BMI >/= 30 associated with increased stroke risk, recommend weight loss, diet and exercise as appropriate   Other Active Problems Asthma GERD  Hospital day # 0  Beulah Gandy DNP, ACNPC-AG  ATTENDING NOTE: I reviewed above note and agree with the assessment and plan. Pt was seen and examined.   46 year old female with history of rheumatoid arthritis on Plaquenil and pulse prednisone treatment admitted for episodes of woozy feeling, stuttering speech, elevated BP and off balance.  Patient has had similar episodes for the last year or 2, normally short lasting could be seconds, no more than once a day.  However for the last 2 weeks she had increased frequency of the episodes, average 5-6 times a day.  During the interval time, BP stable and  near baseline.  Patient denies any stress, anxiety, panic.  She is on pulse steroids treatment now, had 40 mg last week, currently on 20 mg and plan to taper off in 1 week.  She has used pulse steroids treatment in the past and without difficulty.  So far work-up including CT, CT head and neck and MRI were negative.  EEG pending, low suspicious for seizure.  EF 60 to 65%, LDL 88, A1c 5.4, UDS negative.  Creatinine 0.94.  On exam, patient has intermittent stuttering speech but no any other neurological deficit.  Patient symptoms concerning for stress versus panic attack versus steroid use related.  No indication for antiplatelet or statin at this time.  Patient not interested in psychiatry/psychology evaluation.  Therapy recommend outpatient PT/OT/speech.  For detailed assessment and plan, please refer to above/below as I have made changes wherever appropriate.   Neurology will sign off. Please  call with questions.  Discussed with Dr. Florene Glen.  Thanks for the consult.  Rosalin Hawking, MD PhD Stroke Neurology 02/03/2022 6:23 PM   To contact Stroke Continuity provider, please refer to [email or web address] After hours, contact General Neurology

## 2022-02-03 NOTE — Discharge Summary (Signed)
Physician Discharge Summary  Anne Jefferson DJM:[id number] DOB: 06-03-1976 DOA: 02/01/2022  PCP: Josetta Huddle, MD  Admit date: 02/01/2022 Discharge date: 02/03/2022  Time spent: 40 minutes  Recommendations for Outpatient Follow-up:  Follow outpatient CBC/CMP  Follow vestibular rehab outpatient Follow SLP outpatient Follow repeat TSH outpatient   Discharge Diagnoses:  Principal Problem:   Adult stuttering Active Problems:   Lightheadedness   Rheumatoid arthritis ([place])   Cervical spondylosis with radiculopathy   Anxiety   Moderate persistent asthma without complication   Obesity (BMI 30-39.9)   Abnormal TSH   H/O dysmenorrhea   Atopic dermatitis   History of nasal polyposis   Discharge Condition: stable  Diet recommendation: heart healthy  Filed Weights   02/01/22 2143  Weight: 77.1 kg    History of present illness:  Anne Jefferson is Anne Jefferson 46 y.o. 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  Patient states she has had intermittent mild stuttering speech in the past but has not been persistent or frequent like now.  Her 64 year old son who is at bedside reports that this is new and he is barely noticed patient stuttering in the past.  Patient also reports being diagnosed with "spectrum of rheumatoid arthritis" for couple of years now for which she follows Shriley Joffe rheumatologist and being on chloroquine.  Patient apparently was prescribed prednisone taper last week (starting at 40 mg) by her rheumatologist given joint pain flare (mostly along palms and feet).  She reports having neck pain at 1 point associated with numbness along the right upper extremity and was diagnosed with "arthritis or something in my neck" and was prescribed gabapentin which she takes 300 mg 3 times Dashea Mcmullan day.  She states recently she was advised to go up to 400 mg but she has  not started taking the higher dose yet.  She also reports dizzy spells intermittently but she has had headache and dizziness since this morning . She was admitted for workup with regards to her lightheadedness and speech issues.  MRI brain without acute abnormality, she was seen by neurology.  Evaluated by therapy with concern for vertigo.  Planning for outpatient therapy at this point, PCP follow up.  See below for additional details  Hospital Course:  Assessment and Plan: * Adult stuttering She notes hx of getting "stuck on sound longer" but this is much more pronounced Unclear cause MRI without acute findings CTA head/neck "normal" EEG normal A1c 5.4, LDL 88 Recommend outpatient SLP follow up  Lightheadedness Suspected vertigo MRI without acute findings CTA head/neck "normal" Will refer to vestibular rehab outpatient Meclizine prn Negative orthostatics  Rheumatoid arthritis ([place]) Currently on steroid taper  Cervical spondylosis with radiculopathy Continue home celebrex/gabapentin Follow outpatient   Anxiety Follow outaptient  Moderate persistent asthma without complication noted  Abnormal TSH Repeat outpatient  Obesity (BMI 30-39.9) noted         Procedures: Echo IMPRESSIONS     1. Left ventricular ejection fraction, by estimation, is 60 to 65%. The  left ventricle has normal function. The left ventricle has no regional  wall motion abnormalities. Left ventricular diastolic parameters were  normal.   2. Right ventricular systolic function is normal. The right ventricular  size is normal.   3. The mitral valve is normal in structure. Trivial mitral valve  regurgitation. No evidence of mitral stenosis.   4. The aortic valve is normal in structure. Aortic valve regurgitation  is  not visualized. No aortic stenosis is present.   5. The inferior vena cava is normal in size with greater than 50%  respiratory variability, suggesting right atrial pressure of  3 mmHg.    EEG nl per discussion with Dr. Hortense Ramal Consultations: neurology  Discharge Exam: Vitals:   02/03/22 0851 02/03/22 1005  BP:  134/90  Pulse:    Resp:    Temp:    SpO2: 97%    Feels better today than yesterday  General: No acute distress. Cardiovascular: RRR Lungs: unlabored Neurological: Alert and oriented 3. Moves all extremities 4 with equal strength. Cranial nerves II through XII grossly intact. Extremities: No clubbing or cyanosis. No edema.  Discharge Instructions   Discharge Instructions     Ambulatory referral to Occupational Therapy   Complete by: As directed    Ambulatory referral to Physical Therapy   Complete by: As directed    Ambulatory referral to Speech Therapy   Complete by: As directed    Ambulatory referral to Speech Therapy   Complete by: As directed    Call MD for:  difficulty breathing, headache or visual disturbances   Complete by: As directed    Call MD for:  extreme fatigue   Complete by: As directed    Call MD for:  hives   Complete by: As directed    Call MD for:  persistant dizziness or light-headedness   Complete by: As directed    Call MD for:  persistant nausea and vomiting   Complete by: As directed    Call MD for:  redness, tenderness, or signs of infection (pain, swelling, redness, odor or green/yellow discharge around incision site)   Complete by: As directed    Call MD for:  severe uncontrolled pain   Complete by: As directed    Call MD for:  temperature >100.4   Complete by: As directed    Diet - low sodium heart healthy   Complete by: As directed    Discharge instructions   Complete by: As directed    You were seen for dizziness and worsened stuttering speech.  It's not clear what has caused the worsened speech.  We'll send you to follow up with the speech therapist outaptient.  The lightheadedness maybe related to vertigo.  We'll send you home with meclizine to use as needed.  We'll also send you to outpatient  vestibular rehab.    Continue to follow up with your PCP.  They can discuss whether or not you'll follow up with neurology outpatient.  Return for new, recurrent, or worsening symptoms.  Please ask your PCP to request records from this hospitalization so they know what was done and what the next steps will be.   Increase activity slowly   Complete by: As directed       Allergies as of 02/03/2022       Reactions   Promethazine Other (See Comments)   Hallucinations   Theophyllines Nausea And Vomiting   Mite (d. Yehuda Mao) Other (See Comments)   Cats, dogs, dust    Molds & Smuts Other (See Comments)        Medication List     TAKE these medications    albuterol (2.5 MG/3ML) 0.083% nebulizer solution Commonly known as: PROVENTIL Use 1 unit dose via the nebulizer every 6 hours as needed for cough, wheeze, tightness in chest, or shortness of breath.   albuterol 108 (90 Base) MCG/ACT inhaler Commonly known as: Proventil HFA Inhale 2 puffs  into the lungs every 4 (four) hours as needed for wheezing or shortness of breath.   Breztri Aerosphere 160-9-4.8 MCG/ACT Aero Generic drug: Budeson-Glycopyrrol-Formoterol Inhale 2 puffs into the lungs in the morning and at bedtime.   celecoxib 100 MG capsule Commonly known as: CELEBREX Take 100 mg by mouth 2 (two) times daily.   EPINEPHrine 0.3 mg/0.3 mL Soaj injection Commonly known as: Auvi-Q Inject 0.3 mg into the muscle as needed for anaphylaxis.   fexofenadine 180 MG tablet Commonly known as: ALLEGRA Take 180 mg by mouth 2 (two) times daily.   gabapentin 300 MG capsule Commonly known as: NEURONTIN Take 300 mg by mouth 3 (three) times daily.   hydroxychloroquine 200 MG tablet Commonly known as: PLAQUENIL Take 200 mg by mouth 2 (two) times daily.   meclizine 25 MG tablet Commonly known as: ANTIVERT Take 1 tablet (25 mg total) by mouth 3 (three) times daily as needed for dizziness.   mometasone 50 MCG/ACT nasal  spray Commonly known as: NASONEX Place 1 spray into the nose daily. What changed: when to take this   omeprazole 20 MG tablet Commonly known as: PriLOSEC OTC Take 1 tablet (20 mg total) by mouth daily.   predniSONE 20 MG tablet Commonly known as: DELTASONE Taper as prescribed by rheumatology Start taking on: February 04, 2022   traMADol 50 MG tablet Commonly known as: ULTRAM Take 100 mg by mouth 1 day or 1 dose.   UNABLE TO FIND 2 (two) times Emberleigh Reily week. Med Name: ALLERGY SHOTS   VITAMIN D PO Take by mouth daily.       Allergies  Allergen Reactions   Promethazine Other (See Comments)    Hallucinations   Theophyllines Nausea And Vomiting   Mite (D. Farinae) Other (See Comments)    Cats, dogs, dust    Molds & Smuts Other (See Comments)    Follow-up Information     [facility]. Schedule an appointment as soon as possible for Markice Torbert visit in 1 week(s).   Specialty: Rehabilitation Contact information: [address] [place], [place] [address] [id number] [place] [phone]                 The results of significant diagnostics from this hospitalization (including imaging, microbiology, ancillary and laboratory) are listed below for reference.    Significant Diagnostic Studies: ECHOCARDIOGRAM COMPLETE  Result Date: 02/03/2022    ECHOCARDIOGRAM REPORT   Patient Name:   [facility] Date of Exam: 02/03/2022 Medical Rec #:  [id number]              Height:       63.0 in Accession #:    [id number]             Weight:       170.0 lb Date of Birth:  [birth year]              BSA:          1.805 m Patient Age:    18 years               BP:           121/76 mmHg Patient Gender: F                      HR:           81 bpm. Exam Location:  Inpatient Procedure: 2D Echo Indications:    TIA  History:  Patient has no prior history of Echocardiogram examinations.  Sonographer:    Johny Chess RDCS Referring Phys: [id number] [place]  1. Left ventricular ejection fraction, by estimation, is 60 to 65%. The left ventricle has normal function. The left ventricle has no regional wall motion abnormalities. Left ventricular diastolic parameters were normal.  2. Right ventricular systolic function is normal. The right ventricular size is normal.  3. The mitral valve is normal in structure. Trivial mitral valve regurgitation. No evidence of mitral stenosis.  4. The aortic valve is normal in structure. Aortic valve regurgitation is not visualized. No aortic stenosis is present.  5. The inferior vena cava is normal in size with greater than 50% respiratory variability, suggesting right atrial pressure of 3 mmHg. FINDINGS  Left Ventricle: Left ventricular ejection fraction, by estimation, is 60 to 65%. The left ventricle has normal function. The left ventricle has no regional wall motion abnormalities. The left ventricular internal cavity size was normal in size. There is  no left ventricular hypertrophy. Left ventricular diastolic parameters were normal. Right Ventricle: The right ventricular size is normal. No increase in right ventricular wall thickness. Right ventricular systolic function is normal. Left Atrium: Left atrial size was normal in size. Right Atrium: Right atrial size was normal in size. Pericardium: There is no evidence of pericardial effusion. Mitral Valve: The mitral valve is normal in structure. Trivial mitral valve regurgitation. No evidence of mitral valve stenosis. Tricuspid Valve: The tricuspid valve is normal in structure. Tricuspid valve regurgitation is mild . No evidence of tricuspid stenosis. Aortic Valve: The aortic valve is normal in structure. Aortic valve regurgitation is not visualized. No aortic stenosis is present. Pulmonic Valve: The pulmonic valve was normal in structure. Pulmonic valve regurgitation is not visualized. No evidence of pulmonic stenosis. Aorta: The aortic root is normal in size and  structure. Venous: The inferior vena cava is normal in size with greater than 50% respiratory variability, suggesting right atrial pressure of 3 mmHg. IAS/Shunts: No atrial level shunt detected by color flow Doppler.  LEFT VENTRICLE PLAX 2D LVIDd:         4.70 cm   Diastology LVIDs:         3.00 cm   LV e' medial:    10.60 cm/s LV PW:         1.10 cm   LV E/e' medial:  7.4 LV IVS:        1.00 cm   LV e' lateral:   10.90 cm/s LVOT diam:     1.90 cm   LV E/e' lateral: 7.2 LV SV:         57 LV SV Index:   32 LVOT Area:     2.84 cm  RIGHT VENTRICLE             IVC RV Basal diam:  2.70 cm     IVC diam: 1.00 cm RV S prime:     13.40 cm/s TAPSE (M-mode): 2.4 cm LEFT ATRIUM             Index        RIGHT ATRIUM           Index LA diam:        3.50 cm 1.94 cm/m   RA Area:     10.50 cm LA Vol (A2C):   34.9 ml 19.34 ml/m  RA Volume:   20.70 ml  11.47 ml/m LA Vol (A4C):   41.1 ml 22.78 ml/m LA  Biplane Vol: 39.5 ml 21.89 ml/m  AORTIC VALVE LVOT Vmax:   129.00 cm/s LVOT Vmean:  79.100 cm/s LVOT VTI:    0.202 m  AORTA Ao Root diam: 3.00 cm Ao Asc diam:  3.20 cm MITRAL VALVE MV Area (PHT): 4.49 cm    SHUNTS MV Decel Time: 169 msec    Systemic VTI:  0.20 m MV E velocity: 78.00 cm/s  Systemic Diam: 1.90 cm MV Dorthula Bier velocity: 66.50 cm/s MV E/Samadhi Mahurin ratio:  1.17 Candee Furbish MD Electronically signed by Candee Furbish MD Signature Date/Time: 02/03/2022/11:17:24 AM    Final    MR BRAIN W WO CONTRAST  Result Date: 02/03/2022 CLINICAL DATA:  Initial evaluation for neuro deficit, stroke suspected. EXAM: MRI HEAD WITHOUT AND WITH CONTRAST TECHNIQUE: Multiplanar, multiecho pulse sequences of the brain and surrounding structures were obtained without and with intravenous contrast. CONTRAST:  7.45m GADAVIST GADOBUTROL 1 MMOL/ML IV SOLN COMPARISON:  Prior CTs from 02/01/2022. FINDINGS: Brain: Cerebral volume within normal limits for patient age. No focal parenchymal signal abnormality identified. No abnormal foci of restricted diffusion to suggest  acute or subacute ischemia. Gray-white matter differentiation well maintained. No encephalomalacia to suggest chronic infarction or other insult. No foci of susceptibility artifact to suggest acute or chronic intracranial hemorrhage. No mass lesion, midline shift or mass effect. No hydrocephalus. No extra-axial fluid collection. Pituitary gland and suprasellar region are normal. Midline structures intact and normal. No abnormal enhancement. Vascular: Major intracranial vascular flow voids are well maintained. Skull and upper cervical spine: Craniocervical junction normal. Visualized upper cervical spine within normal limits. Bone marrow signal intensity normal. No scalp soft tissue abnormality. Sinuses/Orbits: Globes and orbital soft tissues within normal limits. Small air-fluid level noted within the left sphenoid sinus. Paranasal sinuses are otherwise clear. No mastoid effusion. Other: None. IMPRESSION: Normal brain MRI. No acute intracranial infarct or other abnormality. Electronically Signed   By: BJeannine BogaM.D.   On: 02/03/2022 03:22   CT HEAD CODE STROKE WO CONTRAST  Result Date: 02/01/2022 CLINICAL DATA:  Initial evaluation for neuro deficit, stroke suspected. EXAM: CT ANGIOGRAPHY HEAD AND NECK TECHNIQUE: Multidetector CT imaging of the head and neck was performed using the standard protocol during bolus administration of intravenous contrast. Multiplanar CT image reconstructions and MIPs were obtained to evaluate the vascular anatomy. Carotid stenosis measurements (when applicable) are obtained utilizing NASCET criteria, using the distal internal carotid diameter as the denominator. RADIATION DOSE REDUCTION: This exam was performed according to the departmental dose-optimization program which includes automated exposure control, adjustment of the mA and/or kV according to patient size and/or use of iterative reconstruction technique. CONTRAST:  723mOMNIPAQUE IOHEXOL 350 MG/ML SOLN COMPARISON:   Prior study from 09/01/2020. FINDINGS: CT HEAD FINDINGS Brain: Cerebral volume within normal limits. No acute intracranial hemorrhage. No acute large vessel territory infarct. No mass lesion or midline shift. No hydrocephalus or extra-axial fluid collection. Vascular: No hyperdense vessel. Skull: Scalp soft tissues and calvarium within normal limits. Sinuses/Orbits: Globes orbital soft tissues demonstrate no acute finding. Small air-fluid level noted within the left sphenoid sinus. Paranasal sinuses are otherwise clear. No mastoid effusion. Other: None. ASPECTS ([place] Program Early CT Score) - Ganglionic level infarction (caudate, lentiform nuclei, internal capsule, insula, M1-M3 cortex): 7 - Supraganglionic infarction (M4-M6 cortex): 3 Total score (0-10 with 10 being normal): 10 CTA NECK FINDINGS Aortic arch: Visualized aortic arch normal caliber with standard branching pattern. No stenosis or other abnormality about the origin the great vessels. Right carotid system: Right  common and internal carotid arteries widely patent without stenosis, dissection or occlusion. Left carotid system: Left common and internal carotid arteries widely patent without stenosis, dissection or occlusion. Vertebral arteries: Both vertebral arteries arise from the subclavian arteries. No proximal subclavian artery stenosis. Proximal left vertebral artery not well seen due to adjacent venous contamination. Visualized portions of the vertebral arteries patent without stenosis or dissection. Skeleton: No discrete or worrisome osseous lesions. Other neck: No other acute soft tissue abnormality within the neck. Upper chest: Visualized upper chest demonstrates no acute finding. Review of the MIP images confirms the above findings CTA HEAD FINDINGS Anterior circulation: Both internal carotid arteries widely patent to the termini without stenosis. A1 segments widely patent. Normal anterior communicating artery complex. Both anterior  cerebral arteries widely patent to their distal aspects without stenosis. No M1 stenosis or occlusion. Normal MCA bifurcations. Distal MCA branches well perfused and symmetric. Posterior circulation: Both V4 segments patent to the vertebrobasilar junction without stenosis. Both PICA origins patent and normal. Basilar widely patent to its distal aspect without stenosis. Superior cerebellar arteries patent bilaterally. Both PCAs primarily supplied via the basilar and are well perfused to there distal aspects. Venous sinuses: Grossly patent allowing for timing the contrast bolus. Anatomic variants: None significant.  No aneurysm. Review of the MIP images confirms the above findings IMPRESSION: CT HEAD IMPRESSION: 1. Negative head CT.  No acute intracranial abnormality. 2. Aspects equals 10. 3. Mild left sphenoid sinusitis. CTA HEAD AND NECK IMPRESSION: Normal CTA of the head and neck. No large vessel occlusion, hemodynamically significant stenosis, or other acute vascular abnormality. Critical Value/emergent results were called by telephone at the time of interpretation on 02/01/2022 at 10:42 pm to provider [facility] , who verbally acknowledged these results. Electronically Signed   By: Jeannine Boga M.D.   On: 02/01/2022 22:49   CT ANGIO HEAD NECK W WO CM  Result Date: 02/01/2022 CLINICAL DATA:  Initial evaluation for neuro deficit, stroke suspected. EXAM: CT ANGIOGRAPHY HEAD AND NECK TECHNIQUE: Multidetector CT imaging of the head and neck was performed using the standard protocol during bolus administration of intravenous contrast. Multiplanar CT image reconstructions and MIPs were obtained to evaluate the vascular anatomy. Carotid stenosis measurements (when applicable) are obtained utilizing NASCET criteria, using the distal internal carotid diameter as the denominator. RADIATION DOSE REDUCTION: This exam was performed according to the departmental dose-optimization program which includes automated  exposure control, adjustment of the mA and/or kV according to patient size and/or use of iterative reconstruction technique. CONTRAST:  50m OMNIPAQUE IOHEXOL 350 MG/ML SOLN COMPARISON:  Prior study from 09/01/2020. FINDINGS: CT HEAD FINDINGS Brain: Cerebral volume within normal limits. No acute intracranial hemorrhage. No acute large vessel territory infarct. No mass lesion or midline shift. No hydrocephalus or extra-axial fluid collection. Vascular: No hyperdense vessel. Skull: Scalp soft tissues and calvarium within normal limits. Sinuses/Orbits: Globes orbital soft tissues demonstrate no acute finding. Small air-fluid level noted within the left sphenoid sinus. Paranasal sinuses are otherwise clear. No mastoid effusion. Other: None. ASPECTS ([place] Early CT Score) - Ganglionic level infarction (caudate, lentiform nuclei, internal capsule, insula, M1-M3 cortex): 7 - Supraganglionic infarction (M4-M6 cortex): 3 Total score (0-10 with 10 being normal): 10 CTA NECK FINDINGS Aortic arch: Visualized aortic arch normal caliber with standard branching pattern. No stenosis or other abnormality about the origin the great vessels. Right carotid system: Right common and internal carotid arteries widely patent without stenosis, dissection or occlusion. Left carotid system: Left common and  internal carotid arteries widely patent without stenosis, dissection or occlusion. Vertebral arteries: Both vertebral arteries arise from the subclavian arteries. No proximal subclavian artery stenosis. Proximal left vertebral artery not well seen due to adjacent venous contamination. Visualized portions of the vertebral arteries patent without stenosis or dissection. Skeleton: No discrete or worrisome osseous lesions. Other neck: No other acute soft tissue abnormality within the neck. Upper chest: Visualized upper chest demonstrates no acute finding. Review of the MIP images confirms the above findings CTA HEAD FINDINGS  Anterior circulation: Both internal carotid arteries widely patent to the termini without stenosis. A1 segments widely patent. Normal anterior communicating artery complex. Both anterior cerebral arteries widely patent to their distal aspects without stenosis. No M1 stenosis or occlusion. Normal MCA bifurcations. Distal MCA branches well perfused and symmetric. Posterior circulation: Both V4 segments patent to the vertebrobasilar junction without stenosis. Both PICA origins patent and normal. Basilar widely patent to its distal aspect without stenosis. Superior cerebellar arteries patent bilaterally. Both PCAs primarily supplied via the basilar and are well perfused to there distal aspects. Venous sinuses: Grossly patent allowing for timing the contrast bolus. Anatomic variants: None significant.  No aneurysm. Review of the MIP images confirms the above findings IMPRESSION: CT HEAD IMPRESSION: 1. Negative head CT.  No acute intracranial abnormality. 2. Aspects equals 10. 3. Mild left sphenoid sinusitis. CTA HEAD AND NECK IMPRESSION: Normal CTA of the head and neck. No large vessel occlusion, hemodynamically significant stenosis, or other acute vascular abnormality. Critical Value/emergent results were called by telephone at the time of interpretation on 02/01/2022 at 10:42 pm to provider [facility] , who verbally acknowledged these results. Electronically Signed   By: Jeannine Boga M.D.   On: 02/01/2022 22:49    Microbiology: Recent Results (from the past 240 hour(s))  Resp Panel by RT-PCR (Flu Ruthel Martine&B, Covid) Anterior Nasal Swab     Status: None   Collection Time: 02/01/22 11:37 PM   Specimen: Anterior Nasal Swab  Result Value Ref Range Status   SARS Coronavirus 2 by RT PCR NEGATIVE NEGATIVE Final    Comment: (NOTE) SARS-CoV-2 target nucleic acids are NOT DETECTED.  The SARS-CoV-2 RNA is generally detectable in upper respiratory specimens during the acute phase of infection. The  lowest concentration of SARS-CoV-2 viral copies this assay can detect is 138 copies/mL. Choya Tornow negative result does not preclude SARS-Cov-2 infection and should not be used as the sole basis for treatment or other patient management decisions. Zaiyah Sottile negative result may occur with  improper specimen collection/handling, submission of specimen other than nasopharyngeal swab, presence of viral mutation(s) within the areas targeted by this assay, and inadequate number of viral copies(<138 copies/mL). Shaketha Jeon negative result must be combined with clinical observations, patient history, and epidemiological information. The expected result is Negative.  Fact Sheet for Patients:  EntrepreneurPulse.com.au  Fact Sheet for Healthcare Providers:  [employer]  This test is no t yet approved or cleared by the [place] FDA and  has been authorized for detection and/or diagnosis of SARS-CoV-2 by FDA under an Emergency Use Authorization (EUA). This EUA will remain  in effect (meaning this test can be used) for the duration of the COVID-19 declaration under Section 564(b)(1) of the Act, 21 U.S.C.section 360bbb-3(b)(1), unless the authorization is terminated  or revoked sooner.       Influenza Zoey [place] by PCR NEGATIVE NEGATIVE Final   Influenza B by PCR NEGATIVE NEGATIVE Final    Comment: (NOTE) The Xpert Xpress SARS-CoV-2/FLU/RSV plus assay is intended as  an aid in the diagnosis of influenza from Nasopharyngeal swab specimens and should not be used as Huda Petrey sole basis for treatment. Nasal washings and aspirates are unacceptable for Xpert Xpress SARS-CoV-2/FLU/RSV testing.  Fact Sheet for Patients: EntrepreneurPulse.com.au  Fact Sheet for Healthcare Providers: [employer]  This test is not yet approved or cleared by the [place] FDA and has been authorized for detection and/or diagnosis of SARS-CoV-2 by FDA under  an Emergency Use Authorization (EUA). This EUA will remain in effect (meaning this test can be used) for the duration of the COVID-19 declaration under Section 564(b)(1) of the Act, 21 U.S.C. section 360bbb-3(b)(1), unless the authorization is terminated or revoked.  Performed at [facility], [address] [place], [place] [address]      Labs: Basic Metabolic Panel: Recent Labs  Lab 02/01/22 2201 02/02/22 1412  NA 136  --   K 3.9  --   CL 98  --   CO2 28  --   GLUCOSE 186*  --   BUN 18  --   CREATININE 0.75 0.94  CALCIUM 9.9  --    Liver Function Tests: Recent Labs  Lab 02/01/22 2201  AST 14*  ALT 16  ALKPHOS 46  BILITOT 0.4  PROT 7.2  ALBUMIN 4.6   No results for input(s): "LIPASE", "AMYLASE" in the last 168 hours. No results for input(s): "AMMONIA" in the last 168 hours. CBC: Recent Labs  Lab 02/01/22 2201 02/02/22 1412  WBC 12.1* 8.6  NEUTROABS 9.1*  --   HGB 13.7 13.7  HCT 41.8 41.0  MCV 87.8 85.8  PLT 336 292   Cardiac Enzymes: No results for input(s): "CKTOTAL", "CKMB", "CKMBINDEX", "TROPONINI" in the last 168 hours. BNP: BNP (last 3 results) No results for input(s): "BNP" in the last 8760 hours.  ProBNP (last 3 results) No results for input(s): "PROBNP" in the last 8760 hours.  CBG: No results for input(s): "GLUCAP" in the last 168 hours.     Signed:  Fayrene Helper MD.  [facility] 02/03/2022, 6:47 PM

## 2022-02-03 NOTE — Progress Notes (Signed)
Iv removed pt wheeled down with all belongings

## 2022-02-03 NOTE — Assessment & Plan Note (Addendum)
Suspected vertigo MRI without acute findings CTA head/neck "normal" Will refer to vestibular rehab outpatient Meclizine prn Negative orthostatics

## 2022-02-03 NOTE — Progress Notes (Signed)
Physical Therapy Treatment Patient Details Name: Anne Jefferson MRN: [id number] DOB: 10-14-1975 Today's Date: 02/03/2022   History of Present Illness Pt is a 63 female presenting with increased stutter. Pt was admitted 02/01/22 for OBV. CT on 7/11 negative, MRI negative.  PMH includes asthma, anxiety, and cervicalgia.    PT Comments    Pt reports improvement in her symptoms this afternoon, DGI score rose from 12 to 20/24, corresponding to low fall risk.  Basic vestibular testing ruled out posterior/horizontal BPPV and no nystagmus seen with pursuits/saccades or VOR/thrust.  More complex vestibular testing not completed.  Pt feels ready for d/c home.  All further PT needs can be addressed with OPPT and OT.    Recommendations for follow up therapy are one component of a multi-disciplinary discharge planning process, led by the attending physician.  Recommendations may be updated based on patient status, additional functional criteria and insurance authorization.  Follow Up Recommendations  Outpatient PT     Assistance Recommended at Discharge PRN  Patient can return home with the following     Equipment Recommendations  None recommended by PT    Recommendations for Other Services OT consult     Precautions / Restrictions Precautions Precautions: Fall Restrictions Weight Bearing Restrictions: No     Mobility  Bed Mobility Overal bed mobility: Modified Independent             General bed mobility comments: increased time    Transfers Overall transfer level: Modified independent Equipment used: None               General transfer comment: increased time    Ambulation/Gait Ambulation/Gait assistance: Supervision, Modified independent (Device/Increase time) Gait Distance (Feet): 500 Feet Assistive device: None Gait Pattern/deviations: Step-through pattern   Gait velocity interpretation: >2.62 ft/sec, indicative of community ambulatory   General Gait  Details: pt showed to be more steady, esp at start of session, before balance was challenged.  Pt managed balance challenge with mild deviation. All mild LOB was self-recoverable.   Stairs Stairs: Yes Stairs assistance: Supervision Stair Management: No rails, Alternating pattern Number of Stairs: 10 General stair comments: more deviation descending the stairs, still some without the rail   Wheelchair Mobility    Modified Rankin (Stroke Patients Only)       Balance Overall balance assessment: Needs assistance Sitting-balance support: Feet supported, Feet unsupported Sitting balance-Leahy Scale: Good     Standing balance support: No upper extremity supported Standing balance-Leahy Scale: Fair                       Dynamic Gait Index Level Surface: Normal Change in Gait Speed: Normal Gait with Horizontal Head Turns: Mild Impairment Gait with Vertical Head Turns: Mild Impairment Gait and Pivot Turn: Mild Impairment Step Over Obstacle: Normal Step Around Obstacles: Normal Steps: Mild Impairment Total Score: 20      Cognition Arousal/Alertness: Awake/alert Behavior During Therapy: WFL for tasks assessed/performed Overall Cognitive Status: Within Functional Limits for tasks assessed                                          Exercises      General Comments General comments (skin integrity, edema, etc.): Basic Vestibular assessment.  pursuits and saccades negative.  VOR neg, head thrust neg.  No nystagmus noted.  Hallpike-Dix for L/R side neg. Supine head roll negative, so  posterior and horizontal BPPV negative.  Positional changes today produces no overt dizziness/lightheadedness or spinning.      Pertinent Vitals/Pain Pain Assessment Pain Assessment: No/denies pain    Home Living                          Prior Function            PT Goals (current goals can now be found in the care plan section) Acute Rehab PT Goals PT  Goal Formulation: With patient Time For Goal Achievement: 02/16/22 Potential to Achieve Goals: Good Progress towards PT goals: Progressing toward goals    Frequency    Min 3X/week      PT Plan Current plan remains appropriate    Co-evaluation              AM-PAC PT "6 Clicks" Mobility   Outcome Measure  Help needed turning from your back to your side while in a flat bed without using bedrails?: None Help needed moving from lying on your back to sitting on the side of a flat bed without using bedrails?: None Help needed moving to and from a bed to a chair (including a wheelchair)?: None Help needed standing up from a chair using your arms (e.g., wheelchair or bedside chair)?: None Help needed to walk in hospital room?: A Little Help needed climbing 3-5 steps with a railing? : A Little 6 Click Score: 22    End of Session   Activity Tolerance: Patient tolerated treatment well Patient left: in bed;with call bell/phone within reach;with family/visitor present (MD in the room to hear results) Nurse Communication: Mobility status PT Visit Diagnosis: Unsteadiness on feet (R26.81);Dizziness and giddiness (R42)     Time: 2244-9753 PT Time Calculation (min) (ACUTE ONLY): 32 min  Charges:  $Gait Training: 8-22 mins $Neuromuscular Re-education: 8-22 mins                     02/03/2022  Ginger Carne., PT Acute Rehabilitation Services [phone]  (pager) [phone]  (office)   Tessie Fass Hayze Gazda 02/03/2022, 6:37 PM

## 2022-02-03 NOTE — Progress Notes (Signed)
EEG complete - results pending 

## 2022-02-03 NOTE — Progress Notes (Signed)
  Echocardiogram 2D Echocardiogram has been performed.  Johny Chess 02/03/2022, 9:43 AM

## 2022-02-03 NOTE — Assessment & Plan Note (Signed)
noted 

## 2022-02-03 NOTE — Assessment & Plan Note (Addendum)
She notes hx of getting "stuck on sound longer" but this is much more pronounced Unclear cause MRI without acute findings CTA head/neck "normal" EEG normal A1c 5.4, LDL 88 Recommend outpatient SLP follow up

## 2022-02-03 NOTE — Assessment & Plan Note (Signed)
Repeat outpatient

## 2022-02-03 NOTE — TOC CAGE-AID Note (Signed)
Transition of Care [facility]) - CAGE-AID Screening   Patient Details  Name: Anne Jefferson MRN: [id number] Date of Birth: 1975/11/21  [facility] CM/SW Contact:    Pollie Friar, RN Phone Number: 02/03/2022, 12:45 PM   Clinical Narrative: Pt states she rarely drinks alcohol and doesn't feel she needs alcohol counseling resources.   CAGE-AID Screening:    Have You Ever Felt You Ought to Cut Down on Your Drinking or Drug Use?: No Have People Annoyed You By Critizing Your Drinking Or Drug Use?: No Have You Felt Bad Or Guilty About Your Drinking Or Drug Use?: No Have You Ever Had a Drink or Used Drugs First Thing In The Morning to Steady Your Nerves or to Get Rid of a Hangover?: No CAGE-AID Score: 0  Substance Abuse Education Offered: Yes (refused)

## 2022-02-03 NOTE — Hospital Course (Signed)
Anne Jefferson is Anne Jefferson 46 y.o. 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  Patient states she has had intermittent mild stuttering speech in the past but has not been persistent or frequent like now.  Her 1 year old son who is at bedside reports that this is new and he is barely noticed patient stuttering in the past.  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  Patient apparently was prescribed prednisone taper last week (starting at 40 mg) by her rheumatologist given joint pain flare (mostly along palms and feet).  She reports having neck pain at 1 point associated with numbness along the right upper extremity and was diagnosed with "arthritis or something in my neck" and was prescribed gabapentin which she takes 300 mg 3 times [name] day.  She states recently she was advised to go up to 400 mg but she has not started taking the higher dose yet.  She also reports dizzy spells intermittently but she has had headache and dizziness since this morning . She was admitted for workup with regards to her lightheadedness and speech issues.  MRI brain without acute abnormality, she was seen by neurology.  Evaluated by therapy with concern for vertigo.  Planning for outpatient therapy at this point, PCP follow up.  See below for additional details

## 2022-02-03 NOTE — Progress Notes (Signed)
Called and spoke with RN about coming up for EEG. Pt is in ECHO will try back at a later time.

## 2022-02-03 NOTE — Assessment & Plan Note (Signed)
Continue home celebrex/gabapentin Follow outpatient

## 2022-02-03 NOTE — Evaluation (Signed)
Occupational Therapy Evaluation Patient Details Name: Anne Jefferson MRN: [id number] DOB: 10/06/1975 Today's Date: 02/03/2022   History of Present Illness Pt is a 65 female presenting with increased stutter. Pt was admitted 02/01/22 for OBV. CT on 7/11 negative, MRI negative.  PMH includes asthma, anxiety, and cervicalgia.   Clinical Impression   Pt admitted with the above symptoms and has the deficits listed below. Pt would benefit from cont OT to increase balance and safety with all adls and be able to return home with her partner and 4 children.  Pt showered during OT session after clearing with nursing.  After 2 minute shower, pt go out and was not feeling well. Pt felt dizzy and lightheaded.  BP was 150/100 and repeated was 165/100. Nursing was notified at this time and pt was left sitting in chair with nursing in room. Spoke to charge nurse about symptoms.  Feel pt will benefit from OPOT at d/c to address balance and mild issues with coordination in RUE.        Recommendations for follow up therapy are one component of a multi-disciplinary discharge planning process, led by the attending physician.  Recommendations may be updated based on patient status, additional functional criteria and insurance authorization.   Follow Up Recommendations  Outpatient OT    Assistance Recommended at Discharge Intermittent Supervision/Assistance  Patient can return home with the following A little help with walking and/or transfers;A little help with bathing/dressing/bathroom;Assistance with cooking/housework;Assist for transportation    Functional Status Assessment  Patient has had a recent decline in their functional status and demonstrates the ability to make significant improvements in function in a reasonable and predictable amount of time.  Equipment Recommendations  None recommended by OT    Recommendations for Other Services       Precautions / Restrictions Precautions Precautions:  Fall Restrictions Weight Bearing Restrictions: No      Mobility Bed Mobility Overal bed mobility: Modified Independent             General bed mobility comments: increased time    Transfers Overall transfer level: Modified independent Equipment used: None               General transfer comment: increased time      Balance Overall balance assessment: Needs assistance Sitting-balance support: Feet supported, Feet unsupported Sitting balance-Leahy Scale: Good Sitting balance - Comments: pt able to bring both feet off ground during strength testing without LOB.   Standing balance support: No upper extremity supported Standing balance-Leahy Scale: Fair Standing balance comment: Pt with some LOB when ambulating but able to correct without hands on assist.                           ADL either performed or assessed with clinical judgement   ADL Overall ADL's : Needs assistance/impaired Eating/Feeding: Independent;Sitting   Grooming: Supervision/safety;Standing   Upper Body Bathing: Supervision/ safety;Standing   Lower Body Bathing: Supervison/ safety;Sit to/from stand   Upper Body Dressing : Supervision/safety;Standing   Lower Body Dressing: Supervision/safety;Sit to/from stand   Toilet Transfer: Supervision/safety;Ambulation;Comfort height toilet   Toileting- Clothing Manipulation and Hygiene: Supervision/safety;Sit to/from stand   Tub/ Shower Transfer: Supervision/safety;Ambulation   Functional mobility during ADLs: Minimal assistance General ADL Comments: Pt felt dizzy throughout treatment. Pt stated she has had issues with this for 18 months but now it is much worse.     Vision Baseline Vision/History: 0 No visual deficits Ability to See in  Adequate Light: 0 Adequate Patient Visual Report: No change from baseline Vision Assessment?: No apparent visual deficits     Perception     Praxis      Pertinent Vitals/Pain Pain Assessment Pain  Assessment: No/denies pain     Hand Dominance Right   Extremity/Trunk Assessment Upper Extremity Assessment Upper Extremity Assessment: Overall WFL for tasks assessed RUE Deficits / Details: FNF mildly impaired.  Pt had to focus to touch targets. RUE Coordination: decreased fine motor LUE Deficits / Details: Pt impaired FNF requiring great concentration to hit target. LUE Coordination: decreased fine motor   Lower Extremity Assessment Lower Extremity Assessment: Defer to PT evaluation   Cervical / Trunk Assessment Cervical / Trunk Assessment: Normal   Communication Communication Communication: Other (comment) (stuttering)   Cognition Arousal/Alertness: Awake/alert Behavior During Therapy: WFL for tasks assessed/performed Overall Cognitive Status: Within Functional Limits for tasks assessed                                 General Comments: Pt did well until after shower. After shower, pt did not feel well.  BP 150/100 and repeated it was 165/100.  Nursing aware.     General Comments  Pt most limited by balance.    Exercises     Shoulder Instructions      Home Living Family/patient expects to be discharged to:: Private residence Living Arrangements: Children Available Help at Discharge: Family Type of Home: House Home Access: Stairs to enter [employer] of Steps: 3   Home Layout: Two level;Able to live on main level with bedroom/bathroom     Bathroom Shower/Tub: Occupational psychologist: Standard     Home Equipment: None   Additional Comments: works full time as Astronomer in [employer]      Prior Functioning/Environment Prior Level of Function : Independent/Modified Independent;Working/employed;Driving             Mobility Comments: independent, employed as Electrical engineer ADLs Comments: independent, takes care of children        OT Problem List: Impaired balance (sitting and/or standing);Impaired UE functional  use      OT Treatment/Interventions: Self-care/ADL training;Balance training;Therapeutic activities    OT Goals(Current goals can be found in the care plan section) Acute Rehab OT Goals Patient Stated Goal: to feel better OT Goal Formulation: With patient Time For Goal Achievement: 02/17/22 Potential to Achieve Goals: Good ADL Goals Pt Will Perform Grooming: Independently;standing Additional ADL Goal #1: Pt will bathe standing in shower independently. Additional ADL Goal #2: Pt will walk to bathroom and complete all toileting independently. Additional ADL Goal #3: Pt will gather all clothing and dress independently.  OT Frequency: Min 2X/week    Co-evaluation              AM-PAC OT "6 Clicks" Daily Activity     Outcome Measure Help from another person eating meals?: None Help from another person taking care of personal grooming?: None Help from another person toileting, which includes using toliet, bedpan, or urinal?: A Little Help from another person bathing (including washing, rinsing, drying)?: A Little Help from another person to put on and taking off regular upper body clothing?: None Help from another person to put on and taking off regular lower body clothing?: A Little 6 Click Score: 21   End of Session Nurse Communication: Mobility status;Other (comment) (Pt with high BP/not feeling well)  Activity Tolerance: Other (comment) (  Pt not feeling well after treatment) Patient left: in chair;with call bell/phone within reach;Other (comment) (nursing called to check pt)  OT Visit Diagnosis: Unsteadiness on feet (R26.81)                Time: 3016-0109 OT Time Calculation (min): 44 min Charges:  OT General Charges $OT Visit: 1 Visit OT Evaluation $OT Eval Low Complexity: 1 Low OT Treatments $Self Care/Home Management : 8-22 mins  Glenford Peers 02/03/2022, 9:06 AM

## 2022-02-04 ENCOUNTER — Other Ambulatory Visit (HOSPITAL_COMMUNITY): Payer: Self-pay

## 2022-02-06 ENCOUNTER — Other Ambulatory Visit (HOSPITAL_COMMUNITY): Payer: Self-pay

## 2022-02-07 ENCOUNTER — Ambulatory Visit (INDEPENDENT_AMBULATORY_CARE_PROVIDER_SITE_OTHER): Payer: 59

## 2022-02-07 DIAGNOSIS — J309 Allergic rhinitis, unspecified: Secondary | ICD-10-CM | POA: Diagnosis not present

## 2022-02-13 DIAGNOSIS — R030 Elevated blood-pressure reading, without diagnosis of hypertension: Secondary | ICD-10-CM | POA: Diagnosis not present

## 2022-02-13 DIAGNOSIS — R42 Dizziness and giddiness: Secondary | ICD-10-CM | POA: Diagnosis not present

## 2022-02-16 DIAGNOSIS — Z79899 Other long term (current) drug therapy: Secondary | ICD-10-CM | POA: Diagnosis not present

## 2022-02-16 DIAGNOSIS — M2550 Pain in unspecified joint: Secondary | ICD-10-CM | POA: Diagnosis not present

## 2022-02-16 DIAGNOSIS — R768 Other specified abnormal immunological findings in serum: Secondary | ICD-10-CM | POA: Diagnosis not present

## 2022-02-16 DIAGNOSIS — R030 Elevated blood-pressure reading, without diagnosis of hypertension: Secondary | ICD-10-CM | POA: Diagnosis not present

## 2022-02-16 DIAGNOSIS — R42 Dizziness and giddiness: Secondary | ICD-10-CM | POA: Diagnosis not present

## 2022-02-16 DIAGNOSIS — R0989 Other specified symptoms and signs involving the circulatory and respiratory systems: Secondary | ICD-10-CM | POA: Diagnosis not present

## 2022-02-20 DIAGNOSIS — M5412 Radiculopathy, cervical region: Secondary | ICD-10-CM | POA: Diagnosis not present

## 2022-02-23 ENCOUNTER — Ambulatory Visit (INDEPENDENT_AMBULATORY_CARE_PROVIDER_SITE_OTHER): Payer: 59

## 2022-02-23 DIAGNOSIS — J309 Allergic rhinitis, unspecified: Secondary | ICD-10-CM | POA: Diagnosis not present

## 2022-03-03 ENCOUNTER — Ambulatory Visit (INDEPENDENT_AMBULATORY_CARE_PROVIDER_SITE_OTHER): Payer: 59

## 2022-03-03 DIAGNOSIS — J309 Allergic rhinitis, unspecified: Secondary | ICD-10-CM | POA: Diagnosis not present

## 2022-03-03 DIAGNOSIS — R404 Transient alteration of awareness: Secondary | ICD-10-CM | POA: Diagnosis not present

## 2022-03-03 DIAGNOSIS — R42 Dizziness and giddiness: Secondary | ICD-10-CM | POA: Diagnosis not present

## 2022-03-14 ENCOUNTER — Ambulatory Visit (INDEPENDENT_AMBULATORY_CARE_PROVIDER_SITE_OTHER): Payer: 59 | Admitting: *Deleted

## 2022-03-14 DIAGNOSIS — J309 Allergic rhinitis, unspecified: Secondary | ICD-10-CM

## 2022-03-14 NOTE — Progress Notes (Signed)
OUTPATIENT PHYSICAL THERAPY VESTIBULAR EVALUATION     Patient Name: Anne [place] MRN: [id number] DOB:November 20, 1975, 46 y.o., female Today's Date: 03/15/2022  PCP: Josetta Huddle, MD REFERRING PROVIDER: Kathalene Frames, MD   PT End of Session - 03/15/22 1026     Visit Number 1    Number of Visits 13    Date for PT Re-Evaluation 04/26/22    Authorization Type Aet[employer]    PT Start Time 0803    PT Stop Time 4765    PT Time Calculation (min) 46 min    Activity Tolerance Patient tolerated treatment well;Other (comment)   limited by nausea   Behavior During Therapy [facility] for tasks assessed/performed             Past Medical History:  Diagnosis Date   Abnormal Pap smear    Anxiety    no meds   Asthma    mild, no issue for years no  inhaler use except in winter   Biliary colic    Blood type, Rh negative    Gallstones    H/O dysmenorrhea    Infertility, female    Nausea and vomiting    Pain    back and chest realted to gallbladder since november 2018   PONV (postoperative nausea and vomiting)    severe, needs heavy anti nausea meds   Spinal stenosis    Past Surgical History:  Procedure Laterality Date    c sections     x 2  3 births last was twins   BUNIONECTOMY Right    CHOLECYSTECTOMY N/A 11/22/2017   Procedure: [employer];  Surgeon: Mickeal Skinner, MD;  Location: [facility];  Service: General;  Laterality: N/A;   LEEP  12/23/2009   WISDOM TOOTH EXTRACTION     Patient Active Problem List   Diagnosis Date Noted   Lightheadedness 02/03/2022   Anxiety 02/03/2022   Obesity (BMI 30-39.9) 02/03/2022   Abnormal TSH 02/03/2022   Cervical spondylosis with radiculopathy 02/02/2022   Rheumatoid arthritis ([place]) 02/02/2022   Adult stuttering 02/01/2022   History of nasal polyposis 11/17/2021   Acute cough 10/20/2021   Viral illness 10/20/2021   Moderate persistent asthma without complication 46/50/3546   Moderate persistent  asthma with (acute) exacerbation 11/16/2020   Shooting pain 07/29/2020   Atopic dermatitis 04/01/2019   Perennial and seasonal allergic rhinitis 09/17/2018   Acute non-recurrent frontal sinusitis 09/17/2018   Infertility, female    Abnormal Pap smear    H/O dysmenorrhea    Blood type, Rh negative    METATARSALGIA 12/18/2007   BUNIONS, LEFT FOOT 12/18/2007   UNEQUAL LEG LENGTH 12/18/2007   SPRAIN&STRAIN OF UNSPECIFIED SITE OF KNEE&LEG 11/13/2007    ONSET DATE: 02/01/22  REFERRING DIAG: R42 (ICD-10-CM) - Dizziness and giddiness  THERAPY DIAG:  BPPV (benign paroxysmal positional vertigo), left  Dizziness and giddiness  Unsteadiness on feet  Rationale for Evaluation and Treatment Rehabilitation  SUBJECTIVE:   SUBJECTIVE STATEMENT: Patient reports dizziness occasionally when hiking, changing directions quickly, or getting up from a chair for the past couple of months. Sometimes it occurs spontaneously. Seen in ED on 02/01/22 for c/o increased wooziness. Also reports that BP has been spiking and dropping and has had some temperature changes in the moments when she becomes dizzy. Has associated nausea and feeling of lightheadedness. Episodes last minutes. Following up with Rheumatologist, Neurologist, and PCP about this. Denies head trauma, infection/illness, hearing loss, tinnitus, otalgia, photo/phonophobia. Reports baseline diplopia for several years- reports some  weakness in her eye muscles which she needs surgery for. And reports migraines "once in a while" and nausea with reading for the past 2 months.  Pt accompanied by: self  PERTINENT HISTORY: anxiety, asthma, R bunionectomy; per patient-  has "presumed RA"   PAIN:  Are you having pain? Yes: NPRS scale: 6/10 Pain location: B hands and feet joints   PRECAUTIONS: Fall  WEIGHT BEARING RESTRICTIONS No  FALLS: Has patient fallen in last 6 months? Yes. Number of falls 3  LIVING ENVIRONMENT: Lives with: lives with their  family Lives in: House/apartment Stairs: Yes: Internal: 3 steps; on right going up and on left going up and External: 2nd story steps;   Has following equipment at home: None  PLOF: Independent; works as a HH SLP  PATIENT GOALS improve dizziness   OBJECTIVE:   At rest:  98% SpO2, 74 bpm 130/83 mmHg  DIAGNOSTIC FINDINGS: 02/02/22 brain MRI: Normal brain MRI. No acute intracranial infarct or other abnormality.  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   Ocular ROM:  decreased inferior excursion of L eye    Spontaneous Nystagmus: absent   Gaze-Induced Nystagmus: absent   Smooth Pursuits:  L eye elevated with horizontal pursuit   Saccades: intact; slightly slow and c/o nausea/dizziness    Convergence/Divergence: c/o diplopia at ~1.5 ft away    VESTIBULAR - OCULAR REFLEX:    Slow VOR: Comment: slow and inability to focus L eye; c/o dizziness in vertical direction   VOR Cancellation: Normal; c/o dizziness   Head-Impulse Test: HIT Right: positive HIT Left: slightly positive       POSITIONAL TESTING:  Right Roll Test: negative; c/o mild dizziness Left Roll Test: negative; c/o mild dizziness Right Dix-Hallpike: negative Left Dix-Hallpike: L upbeating torsional nystagmus; unable to tolerate full duration of test d/t nausea   VESTIBULAR TREATMENT:   PATIENT EDUCATION: Education details: prognosis, POC, HEP, provided edu and handout on vestibular hypofunction and BPPV Person educated:  Patient Education method: Explanation, Tactile cues, Verbal cues, and Handouts Education comprehension: verbalized understanding   GOALS: Goals reviewed with patient? Yes  SHORT TERM GOALS: Target date: 04/05/2022  Patient to be independent with initial HEP. Baseline: HEP initiated Goal status: INITIAL    LONG TERM GOALS: Target date: 04/26/2022  Patient to be independent with advanced HEP. Baseline: Not yet initiated  Goal status: INITIAL  Patient to report 0/10 dizziness with standing vertical and horizontal VOR for 30 seconds. Baseline: Unable Goal status: INITIAL  Patient will report 0/10 dizziness with bed mobility.  Baseline: Symptomatic  Goal status: INITIAL  Patient to demonstrate mild-moderate sway with M-CTSIB condition with eyes closed/foam surface in order to improve safety in environments with uneven surfaces and dim lighting. Baseline: NT Goal status: INITIAL  Patient to score at least 20/24 on DGI in order to decrease risk of falls. Baseline: NT Goal status: INITIAL  Patient will ambulate over outdoor surfaces with LRAD while performing head turns to scan environment with good stability in order to indicate safe community mobility. Baseline:  Unable Goal status: INITIAL   ASSESSMENT:  CLINICAL IMPRESSION:   Patient is a 46 y/o F presenting to OPPT with c/o dizziness for the past couple months, worsening acutely on 02/01/22 when she was admitted for observation d/t increased stutter, vertigo, and weakness- MRI and CT clear. Episodes are described as "nausea and feeling of lightheadedness" and last minutes. Worse with hiking, changing directions quickly, or getting up from a chair. Also reports associated spike/drops in BP when dizziness occurs.  Denies head trauma, infection/illness, hearing loss, tinnitus, otalgia, photo/phonophobia. Reports baseline diplopia for several years,  migraines "once in a while," and nausea with reading for the past 2 months.  Oculomotor exam revealed L eye hypertropia with horizontal smooth pursuit, slowness and c/o nausea/dizziness with saccades, convergence insufficiency, dizziness with VOR and VOR cancellation, and positive R>L HIT. 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 Patient unable to tolerate CRM d/t nausea; plan for this next session. Patient was educated on vestibular hypofunction and BPPV and reported understanding. Would benefit from skilled PT services 2x/week for 6 weeks to address aforementioned impairments in order to optimize level of function.    OBJECTIVE IMPAIRMENTS Abnormal gait, decreased activity tolerance, decreased balance, dizziness, and pain.   ACTIVITY LIMITATIONS carrying, lifting, bending, sitting, standing, squatting, sleeping, stairs, transfers, bed mobility, bathing, dressing, and caring for others  PARTICIPATION LIMITATIONS: meal prep, cleaning, laundry, driving, shopping, community activity, occupation, yard work, and church  PERSONAL FACTORS Age, Past/current experiences, Profession, Time since onset of injury/illness/exacerbation, and 3+ comorbidities: anxiety, asthma, R bunionectomy  are also affecting patient's functional outcome.   REHAB POTENTIAL: Good  CLINICAL DECISION MAKING: Evolving/moderate complexity  EVALUATION COMPLEXITY: Moderate   PLAN: PT FREQUENCY: 1-2x/week  PT DURATION: 6 weeks  PLANNED INTERVENTIONS: Therapeutic exercises, Therapeutic activity, Neuromuscular re-education, Balance training, Gait training, Patient/Family education, Self Care, Joint mobilization, Stair training, Vestibular training, Canalith repositioning, Dry Needling, Cryotherapy, Moist heat, Taping, Manual therapy, and Re-evaluation  PLAN FOR NEXT SESSION: reassess and treat L DH, DGI, M-CTSIB, initiate HEP with VOR training and habituation     Janene Harvey, PT, DPT 03/15/22 10:50 AM  [facility] at [facility] [address], [place], [place] [address] Phone # [phone] Fax # [phone]

## 2022-03-15 ENCOUNTER — Ambulatory Visit: Payer: 59 | Attending: Internal Medicine | Admitting: Physical Therapy

## 2022-03-15 ENCOUNTER — Other Ambulatory Visit: Payer: Self-pay

## 2022-03-15 ENCOUNTER — Encounter: Payer: Self-pay | Admitting: Physical Therapy

## 2022-03-15 DIAGNOSIS — H8112 Benign paroxysmal vertigo, left ear: Secondary | ICD-10-CM | POA: Diagnosis present

## 2022-03-15 DIAGNOSIS — R2681 Unsteadiness on feet: Secondary | ICD-10-CM | POA: Diagnosis present

## 2022-03-15 DIAGNOSIS — R42 Dizziness and giddiness: Secondary | ICD-10-CM | POA: Insufficient documentation

## 2022-03-17 NOTE — Progress Notes (Unsigned)
OUTPATIENT PHYSICAL THERAPY VESTIBULAR TREATMENT     Patient Name: Anne [place] MRN: [id number] DOB:1975-08-06, 46 y.o., female Today's Date: 03/20/2022  PCP: Josetta Huddle, MD REFERRING PROVIDER: Kathalene Frames, MD   PT End of Session - 03/20/22 [phone]     Visit Number 2    Number of Visits 13    Date for PT Re-Evaluation 04/26/22    Authorization Type Aet[employer]    PT Start Time 0803    PT Stop Time 0822    PT Time Calculation (min) 19 min    Activity Tolerance Other (comment)   limited by nausea   Behavior During Therapy [facility] for tasks assessed/performed              Past Medical History:  Diagnosis Date   Abnormal Pap smear    Anxiety    no meds   Asthma    mild, no issue for years no  inhaler use except in winter   Biliary colic    Blood type, Rh negative    Gallstones    H/O dysmenorrhea    Infertility, female    Nausea and vomiting    Pain    back and chest realted to gallbladder since november 2018   PONV (postoperative nausea and vomiting)    severe, needs heavy anti nausea meds   Spinal stenosis    Past Surgical History:  Procedure Laterality Date    c sections     x 2  3 births last was twins   BUNIONECTOMY Right    CHOLECYSTECTOMY N/A 11/22/2017   Procedure: [place];  Surgeon: Mickeal Skinner, MD;  Location: [facility];  Service: General;  Laterality: N/A;   LEEP  12/23/2009   WISDOM TOOTH EXTRACTION     Patient Active Problem List   Diagnosis Date Noted   Lightheadedness 02/03/2022   Anxiety 02/03/2022   Obesity (BMI 30-39.9) 02/03/2022   Abnormal TSH 02/03/2022   Cervical spondylosis with radiculopathy 02/02/2022   Rheumatoid arthritis ([place]) 02/02/2022   Adult stuttering 02/01/2022   History of nasal polyposis 11/17/2021   Acute cough 10/20/2021   Viral illness 10/20/2021   Moderate persistent asthma without complication 32/35/5732   Moderate persistent asthma with (acute) exacerbation  11/16/2020   Shooting pain 07/29/2020   Atopic dermatitis 04/01/2019   Perennial and seasonal allergic rhinitis 09/17/2018   Acute non-recurrent frontal sinusitis 09/17/2018   Infertility, female    Abnormal Pap smear    H/O dysmenorrhea    Blood type, Rh negative    METATARSALGIA 12/18/2007   BUNIONS, LEFT FOOT 12/18/2007   UNEQUAL LEG LENGTH 12/18/2007   SPRAIN&STRAIN OF UNSPECIFIED SITE OF KNEE&LEG 11/13/2007    ONSET DATE: 02/01/22  REFERRING DIAG: R42 (ICD-10-CM) - Dizziness and giddiness  THERAPY DIAG:  BPPV (benign paroxysmal positional vertigo), left  Dizziness and giddiness  Unsteadiness on feet  Rationale for Evaluation and Treatment Rehabilitation  SUBJECTIVE:   SUBJECTIVE STATEMENT: Had some nausea and vomiting after last appointment. Requesting to take it a little easy today. Current dizziness 3-4/10 and having some nausea. Did take anti-nausea meds this AM.   Pt accompanied by: self  PERTINENT HISTORY: anxiety, asthma, R bunionectomy; per patient-  has "presumed RA"   PAIN:  Are you having pain? Yes: NPRS scale: 3/10 Pain location: B hands joints   PRECAUTIONS: Fall  PATIENT GOALS improve dizziness   OBJECTIVE:   TODAY'S TREATMENT: 03/20/22 Activity Comments  Sitting head turns to targets 2x20" 4-6/10 dizziness  Sitting head nods to targets 2x20" 5/10 dizziness   Romberg EC 2x20" Limited by nausea    HOME EXERCISE PROGRAM Last updated: 03/20/22 Access Code: [email or web address] URL: [email or web address] Date: 03/20/2022 Prepared by: [facility]  Exercises - Seated Left Head Turns Vestibular Habituation  - 1 x daily - 5 x weekly - 3-4 sets - 20 sec hold - Seated Head Nods Vestibular Habituation  - 1 x daily - 5 x weekly - 3-4 sets - 20 sec hold - Romberg Stance with Eyes Closed  - 1 x daily - 5 x weekly - 3 sets - 20 sec hold   PATIENT EDUCATION: Education details: HEP, edu on ideal intensity  of sx with exercises Person educated: Patient Education method: Explanation, Demonstration, Tactile cues, Verbal cues, and Handouts Education comprehension: verbalized understanding and returned demonstration    Below measures were taken at time of initial evaluation unless otherwise specified:    At rest:  98% SpO2, 74 bpm 130/83 mmHg  DIAGNOSTIC FINDINGS: 02/02/22 brain MRI: Normal brain MRI. No acute intracranial infarct or other abnormality.  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   Ocular ROM:  decreased inferior excursion of L eye    Spontaneous Nystagmus: absent   Gaze-Induced Nystagmus: absent   Smooth Pursuits:  L eye elevated with horizontal pursuit   Saccades: intact; slightly slow and c/o nausea/dizziness    Convergence/Divergence: c/o diplopia at ~1.5 ft away    VESTIBULAR - OCULAR REFLEX:    Slow VOR: Comment: slow and inability to focus L eye; c/o dizziness in vertical direction   VOR Cancellation: Normal; c/o dizziness   Head-Impulse Test: HIT Right: positive HIT Left: slightly positive       POSITIONAL TESTING:  Right Roll Test: negative; c/o mild dizziness Left Roll Test: negative; c/o mild dizziness Right Dix-Hallpike: negative Left Dix-Hallpike: L upbeating torsional nystagmus; unable to tolerate full duration of test d/t nausea   VESTIBULAR TREATMENT:   PATIENT EDUCATION: Education details: prognosis, POC, HEP, provided edu and handout on vestibular  hypofunction and BPPV Person educated: Patient Education method: Explanation, Tactile cues, Verbal cues, and Handouts Education comprehension: verbalized understanding   GOALS: Goals reviewed with patient? Yes  SHORT TERM GOALS: Target date: 04/05/2022  Patient to be independent with initial HEP. Baseline: HEP initiated Goal status: IN PROGRESS    LONG TERM GOALS: Target date: 04/26/2022  Patient to be independent with advanced HEP. Baseline: Not yet initiated  Goal status: IN PROGRESS  Patient to report 0/10 dizziness with standing vertical and horizontal VOR for 30 seconds. Baseline: Unable Goal status: IN PROGRESS  Patient will report 0/10 dizziness with bed mobility.  Baseline: Symptomatic  Goal status: IN PROGRESS  Patient to demonstrate mild-moderate sway with M-CTSIB condition with eyes closed/foam surface in order to improve safety in environments with uneven surfaces and dim lighting. Baseline: NT Goal status: IN PROGRESS  Patient to score at least 20/24 on DGI in order to decrease risk of falls. Baseline: NT Goal status: IN  PROGRESS  Patient will ambulate over outdoor surfaces with LRAD while performing head turns to scan environment with good stability in order to indicate safe community mobility. Baseline: Unable Goal status: IN PROGRESS   ASSESSMENT:  CLINICAL IMPRESSION: Patient arrived to session with report of having episodes of nausea and vomiting after last appointment and requesting to have less intense session today. Initiated gentle seated gaze stabilization exercises for decreased duration to avoid excessive exacerbation of dizziness/nausea. Also worked on [employer]. Patient reported significant nausea with all activities today and requested to finish session early d/t intensity of symptoms. Patient reported understanding of HEP handout provided.   OBJECTIVE IMPAIRMENTS Abnormal gait, decreased activity tolerance, decreased  balance, dizziness, and pain.   ACTIVITY LIMITATIONS carrying, lifting, bending, sitting, standing, squatting, sleeping, stairs, transfers, bed mobility, bathing, dressing, and caring for others  PARTICIPATION LIMITATIONS: meal prep, cleaning, laundry, driving, shopping, community activity, occupation, yard work, and church  PERSONAL FACTORS Age, Past/current experiences, Profession, Time since onset of injury/illness/exacerbation, and 3+ comorbidities: anxiety, asthma, R bunionectomy  are also affecting patient's functional outcome.   REHAB POTENTIAL: Good  CLINICAL DECISION MAKING: Evolving/moderate complexity  EVALUATION COMPLEXITY: Moderate   PLAN: PT FREQUENCY: 1-2x/week  PT DURATION: 6 weeks  PLANNED INTERVENTIONS: Therapeutic exercises, Therapeutic activity, Neuromuscular re-education, Balance training, Gait training, Patient/Family education, Self Care, Joint mobilization, Stair training, Vestibular training, Canalith repositioning, Dry Needling, Cryotherapy, Moist heat, Taping, Manual therapy, and Re-evaluation  PLAN FOR NEXT SESSION: reassess and treat L DH, DGI, M-CTSIB, reassess HEP with VOR training and habituation     Janene Harvey, PT, DPT 03/20/22 8:28 AM  [facility] at [facility] [address] [place], [place] [address] Phone # [phone] Fax # [phone]

## 2022-03-20 ENCOUNTER — Ambulatory Visit: Payer: 59 | Admitting: Physical Therapy

## 2022-03-20 ENCOUNTER — Encounter: Payer: Self-pay | Admitting: Physical Therapy

## 2022-03-20 DIAGNOSIS — R42 Dizziness and giddiness: Secondary | ICD-10-CM

## 2022-03-20 DIAGNOSIS — H8112 Benign paroxysmal vertigo, left ear: Secondary | ICD-10-CM

## 2022-03-20 DIAGNOSIS — R2681 Unsteadiness on feet: Secondary | ICD-10-CM

## 2022-03-22 ENCOUNTER — Ambulatory Visit: Payer: 59 | Admitting: Physical Therapy

## 2022-03-22 DIAGNOSIS — H1045 Other chronic allergic conjunctivitis: Secondary | ICD-10-CM | POA: Diagnosis not present

## 2022-03-22 DIAGNOSIS — Z83511 Family history of glaucoma: Secondary | ICD-10-CM | POA: Diagnosis not present

## 2022-03-22 DIAGNOSIS — H5022 Vertical strabismus, left eye: Secondary | ICD-10-CM | POA: Diagnosis not present

## 2022-03-22 DIAGNOSIS — Z79899 Other long term (current) drug therapy: Secondary | ICD-10-CM | POA: Diagnosis not present

## 2022-03-22 DIAGNOSIS — M2550 Pain in unspecified joint: Secondary | ICD-10-CM | POA: Diagnosis not present

## 2022-03-22 DIAGNOSIS — H01025 Squamous blepharitis left lower eyelid: Secondary | ICD-10-CM | POA: Diagnosis not present

## 2022-03-22 DIAGNOSIS — R42 Dizziness and giddiness: Secondary | ICD-10-CM | POA: Diagnosis not present

## 2022-03-22 DIAGNOSIS — H43823 Vitreomacular adhesion, bilateral: Secondary | ICD-10-CM | POA: Diagnosis not present

## 2022-03-22 DIAGNOSIS — R768 Other specified abnormal immunological findings in serum: Secondary | ICD-10-CM | POA: Diagnosis not present

## 2022-03-23 ENCOUNTER — Ambulatory Visit (INDEPENDENT_AMBULATORY_CARE_PROVIDER_SITE_OTHER): Payer: 59

## 2022-03-23 DIAGNOSIS — J309 Allergic rhinitis, unspecified: Secondary | ICD-10-CM

## 2022-03-24 ENCOUNTER — Ambulatory Visit: Payer: 59 | Admitting: Physical Therapy

## 2022-03-29 NOTE — Progress Notes (Signed)
OUTPATIENT PHYSICAL THERAPY VESTIBULAR TREATMENT     Patient Name: Anne Jefferson MRN: [id number] DOB:[birth year], 46 y.o., female Today's Date: 03/30/2022  PCP: Josetta Huddle, MD REFERRING PROVIDER: Kathalene Frames, MD   PT End of Session - 03/30/22 0830     Visit Number 3    Number of Visits 13    Date for PT Re-Evaluation 04/26/22    Authorization Type [employer]    PT Start Time 0803    PT Stop Time 0829    PT Time Calculation (min) 26 min    Activity Tolerance Other (comment);Patient tolerated treatment well   limited by nausea   Behavior During Therapy [facility] for tasks assessed/performed               Past Medical History:  Diagnosis Date   Abnormal Pap smear    Anxiety    no meds   Asthma    mild, no issue for years no  inhaler use except in winter   Biliary colic    Blood type, Rh negative    Gallstones    H/O dysmenorrhea    Infertility, female    Nausea and vomiting    Pain    back and chest realted to gallbladder since november 2018   PONV (postoperative nausea and vomiting)    severe, needs heavy anti nausea meds   Spinal stenosis    Past Surgical History:  Procedure Laterality Date    c sections     x 2  3 births last was twins   BUNIONECTOMY Right    CHOLECYSTECTOMY N/A 11/22/2017   Procedure: [place];  Surgeon: Mickeal Skinner, MD;  Location: [facility];  Service: General;  Laterality: N/A;   LEEP  12/23/2009   WISDOM TOOTH EXTRACTION     Patient Active Problem List   Diagnosis Date Noted   Lightheadedness 02/03/2022   Anxiety 02/03/2022   Obesity (BMI 30-39.9) 02/03/2022   Abnormal TSH 02/03/2022   Cervical spondylosis with radiculopathy 02/02/2022   Rheumatoid arthritis ([place]) 02/02/2022   Adult stuttering 02/01/2022   History of nasal polyposis 11/17/2021   Acute cough 10/20/2021   Viral illness 10/20/2021   Moderate persistent asthma without complication 11/94/1740   Moderate persistent  asthma with (acute) exacerbation 11/16/2020   Shooting pain 07/29/2020   Atopic dermatitis 04/01/2019   Perennial and seasonal allergic rhinitis 09/17/2018   Acute non-recurrent frontal sinusitis 09/17/2018   Infertility, female    Abnormal Pap smear    H/O dysmenorrhea    Blood type, Rh negative    METATARSALGIA 12/18/2007   BUNIONS, LEFT FOOT 12/18/2007   UNEQUAL LEG LENGTH 12/18/2007   SPRAIN&STRAIN OF UNSPECIFIED SITE OF KNEE&LEG 11/13/2007    ONSET DATE: 02/01/22  REFERRING DIAG: R42 (ICD-10-CM) - Dizziness and giddiness  THERAPY DIAG:  BPPV (benign paroxysmal positional vertigo), left  Dizziness and giddiness  Unsteadiness on feet  Rationale for Evaluation and Treatment Rehabilitation  SUBJECTIVE:   SUBJECTIVE STATEMENT: Just took her nausea med right before appointment. The dizziness is getting better quickly. Does feel like when she stops walking she still feels like she is moving. Notes that she is tolerating HEP better. Feels like this is related to her RA as even that feels better today.   Pt accompanied by: self  PERTINENT HISTORY: anxiety, asthma, R bunionectomy; per patient-  has "presumed RA"   PAIN:  Are you having pain? Yes: NPRS scale: 2/10 Pain location: B hands joints   PRECAUTIONS: Fall  PATIENT GOALS improve dizziness   OBJECTIVE:    TODAY'S TREATMENT: 03/30/22 Activity Comments  L DH L upbeating torsional nystagmus lasting ~1 min; dizziness and nausea  L Epley  Tolerated well   L sidelying test  C/o nausea; no nystagmus   R sidelying test  C/o nausea; no nystagmus           PATIENT EDUCATION: Education details: HEP update; answered patient's questions on BPPV and nystagmus  Person educated: Patient Education method: Explanation, Demonstration, Tactile cues, Verbal cues, and Handouts Education comprehension: verbalized understanding   HOME EXERCISE PROGRAM Last updated: 03/30/22 Access Code: [email or web address] URL:  [email or web address] Date: 03/30/2022 Prepared by: [facility]  Exercises - Brandt-Daroff Vestibular Exercise  - 1 x daily - 5 x weekly - 2 sets - 3-5 reps - Romberg Stance with Eyes Closed  - 1 x daily - 5 x weekly - 3 sets - 30 sec hold - Standing with Head Rotation  - 1 x daily - 5 x weekly - 2 sets - 30 sec hold - Standing with Head Nod  - 1 x daily - 5 x weekly - 2 sets - 30 sec hold   Below measures were taken at time of initial evaluation unless otherwise specified:    At rest:  98% SpO2, 74 bpm 130/83 mmHg  DIAGNOSTIC FINDINGS: 02/02/22 brain MRI: Normal brain MRI. No acute intracranial infarct or other abnormality.  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   Ocular ROM:  decreased inferior excursion of L eye    Spontaneous Nystagmus: absent   Gaze-Induced Nystagmus: absent   Smooth Pursuits:  L eye elevated with horizontal pursuit   Saccades: intact; slightly slow and c/o nausea/dizziness    Convergence/Divergence: c/o diplopia at ~1.5 ft away    VESTIBULAR - OCULAR REFLEX:    Slow VOR: Comment: slow and inability to focus L eye; c/o dizziness in vertical direction   VOR Cancellation: Normal; c/o dizziness   Head-Impulse Test: HIT Right: positive HIT Left: slightly positive       POSITIONAL TESTING:  Right Roll Test: negative; c/o mild dizziness Left Roll Test: negative; c/o mild dizziness Right Dix-Hallpike: negative Left  Dix-Hallpike: L upbeating torsional nystagmus; unable to tolerate full duration of test d/t nausea   VESTIBULAR TREATMENT:   PATIENT EDUCATION: Education details: prognosis, POC, HEP, provided edu and handout on vestibular hypofunction and BPPV Person educated: Patient Education method: Explanation, Tactile cues, Verbal cues, and Handouts Education comprehension: verbalized understanding   GOALS: Goals reviewed with patient? Yes  SHORT TERM GOALS: Target date: 04/05/2022  Patient to be independent with initial HEP. Baseline: HEP initiated Goal status: INITIAL    LONG TERM GOALS: Target date: 04/26/2022  Patient to be independent with advanced HEP. Baseline: Not yet initiated  Goal status: IN PROGRESS  Patient to report 0/10 dizziness with standing vertical and horizontal VOR for 30 seconds. Baseline: Unable Goal status: IN PROGRESS  Patient will report 0/10 dizziness with bed mobility.  Baseline: Symptomatic  Goal status: IN PROGRESS  Patient to demonstrate mild-moderate sway with M-CTSIB condition with eyes closed/foam surface in order to improve safety in environments with uneven surfaces and dim lighting. Baseline: NT Goal status: IN PROGRESS  Patient to score at least 20/24 on DGI in order to decrease risk of falls. Baseline: NT Goal status: IN PROGRESS  Patient will ambulate over outdoor surfaces with LRAD while performing head turns to scan environment with good stability in order to indicate safe community mobility. Baseline: Unable Goal status: IN PROGRESS   ASSESSMENT:  CLINICAL IMPRESSION: Patient arrived to session with report of improving dizziness. Retested positional testing which revealed L posterior canalithiasis vs. Cupulolithiasis. Treated with L Epley. Subsequent L sidelying test was negative however still symptomatic. D/t patient's nausea, updated HEP to increase challenge slightly and finished session early. Patient reported understanding of  all edu provided.   OBJECTIVE IMPAIRMENTS Abnormal gait, decreased activity tolerance, decreased balance, dizziness, and pain.   ACTIVITY LIMITATIONS carrying, lifting, bending, sitting, standing, squatting, sleeping, stairs, transfers, bed mobility, bathing, dressing, and caring for others  PARTICIPATION LIMITATIONS: meal prep, cleaning, laundry, driving, shopping, community activity, occupation, yard work, and church  PERSONAL FACTORS Age, Past/current experiences, Profession, Time since onset of injury/illness/exacerbation, and 3+ comorbidities: anxiety, asthma, R bunionectomy  are also affecting patient's functional outcome.   REHAB POTENTIAL: Good  CLINICAL DECISION MAKING: Evolving/moderate complexity  EVALUATION COMPLEXITY: Moderate   PLAN: PT FREQUENCY: 1-2x/week  PT DURATION: 6 weeks  PLANNED INTERVENTIONS: Therapeutic exercises, Therapeutic activity, Neuromuscular re-education, Balance training, Gait training, Patient/Family education, Self Care, Joint mobilization, Stair training, Vestibular training, Canalith repositioning, Dry Needling, Cryotherapy, Moist heat, Taping, Manual therapy, and Re-evaluation  PLAN FOR NEXT SESSION: reassess and treat L DH, DGI, M-CTSIB, reassess HEP with VOR training and habituation     Janene Harvey, PT, DPT 03/30/22 8:33 AM  [facility] at [facility] [address], [place], [place] [address] Phone # [phone] Fax # [phone]

## 2022-03-30 ENCOUNTER — Ambulatory Visit (INDEPENDENT_AMBULATORY_CARE_PROVIDER_SITE_OTHER): Payer: 59

## 2022-03-30 ENCOUNTER — Encounter: Payer: Self-pay | Admitting: Physical Therapy

## 2022-03-30 ENCOUNTER — Ambulatory Visit: Payer: 59 | Attending: Internal Medicine | Admitting: Physical Therapy

## 2022-03-30 DIAGNOSIS — J309 Allergic rhinitis, unspecified: Secondary | ICD-10-CM | POA: Diagnosis not present

## 2022-03-30 DIAGNOSIS — R2681 Unsteadiness on feet: Secondary | ICD-10-CM | POA: Insufficient documentation

## 2022-03-30 DIAGNOSIS — H8112 Benign paroxysmal vertigo, left ear: Secondary | ICD-10-CM | POA: Insufficient documentation

## 2022-03-30 DIAGNOSIS — R42 Dizziness and giddiness: Secondary | ICD-10-CM | POA: Diagnosis not present

## 2022-03-31 NOTE — Progress Notes (Signed)
OUTPATIENT PHYSICAL THERAPY VESTIBULAR TREATMENT     Patient Name: Anne Jefferson MRN: [id number] DOB:04/01/1976, 46 y.o., female Today's Date: 03/31/2022  PCP: Josetta Huddle, MD REFERRING PROVIDER: Kathalene Frames, MD       Past Medical History:  Diagnosis Date   Abnormal Pap smear    Anxiety    no meds   Asthma    mild, no issue for years no  inhaler use except in winter   Biliary colic    Blood type, Rh negative    Gallstones    H/O dysmenorrhea    Infertility, female    Nausea and vomiting    Pain    back and chest realted to gallbladder since november 2018   PONV (postoperative nausea and vomiting)    severe, needs heavy anti nausea meds   Spinal stenosis    Past Surgical History:  Procedure Laterality Date    c sections     x 2  3 births last was twins   BUNIONECTOMY Right    CHOLECYSTECTOMY N/A 11/22/2017   Procedure: [place];  Surgeon: Mickeal Skinner, MD;  Location: [facility];  Service: General;  Laterality: N/A;   LEEP  12/23/2009   WISDOM TOOTH EXTRACTION     Patient Active Problem List   Diagnosis Date Noted   Lightheadedness 02/03/2022   Anxiety 02/03/2022   Obesity (BMI 30-39.9) 02/03/2022   Abnormal TSH 02/03/2022   Cervical spondylosis with radiculopathy 02/02/2022   Rheumatoid arthritis ([place]) 02/02/2022   Adult stuttering 02/01/2022   History of nasal polyposis 11/17/2021   Acute cough 10/20/2021   Viral illness 10/20/2021   Moderate persistent asthma without complication 21/30/8657   Moderate persistent asthma with (acute) exacerbation 11/16/2020   Shooting pain 07/29/2020   Atopic dermatitis 04/01/2019   Perennial and seasonal allergic rhinitis 09/17/2018   Acute non-recurrent frontal sinusitis 09/17/2018   Infertility, female    Abnormal Pap smear    H/O dysmenorrhea    Blood type, Rh negative    METATARSALGIA 12/18/2007   BUNIONS, LEFT FOOT 12/18/2007   UNEQUAL LEG LENGTH  12/18/2007   SPRAIN&STRAIN OF UNSPECIFIED SITE OF KNEE&LEG 11/13/2007    ONSET DATE: 02/01/22  REFERRING DIAG: R42 (ICD-10-CM) - Dizziness and giddiness  THERAPY DIAG:  No diagnosis found.  Rationale for Evaluation and Treatment Rehabilitation  SUBJECTIVE:   SUBJECTIVE STATEMENT: Just took her nausea med right before appointment. The dizziness is getting better quickly. Does feel like when she stops walking she still feels like she is moving. Notes that she is tolerating HEP better. Feels like this is related to her RA as even that feels better today.   Pt accompanied by: self  PERTINENT HISTORY: anxiety, asthma, R bunionectomy; per patient-  has "presumed RA"   PAIN:  Are you having pain? Yes: NPRS scale: 2/10 Pain location: B hands joints   PRECAUTIONS: Fall  PATIENT GOALS improve dizziness   OBJECTIVE:    TODAY'S TREATMENT: 04/03/22 Activity Comments                        HOME EXERCISE PROGRAM Last updated: 03/30/22 Access Code: [email or web address] URL: [email or web address] Date: 03/30/2022 Prepared by: [facility]  Exercises - Brandt-Daroff Vestibular Exercise  - 1 x daily - 5 x weekly - 2 sets - 3-5 reps - Romberg Stance with Eyes Closed  - 1 x daily - 5 x weekly - 3 sets - 30  sec hold - Standing with Head Rotation  - 1 x daily - 5 x weekly - 2 sets - 30 sec hold - Standing with Head Nod  - 1 x daily - 5 x weekly - 2 sets - 30 sec hold   Below measures were taken at time of initial evaluation unless otherwise specified:    At rest:  98% SpO2, 74 bpm 130/83 mmHg  DIAGNOSTIC FINDINGS: 02/02/22 brain MRI: Normal brain MRI. No acute intracranial infarct or other abnormality.  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   Ocular ROM:  decreased inferior excursion of L eye    Spontaneous Nystagmus: absent   Gaze-Induced Nystagmus: absent   Smooth Pursuits:  L eye elevated with horizontal pursuit   Saccades: intact; slightly slow and c/o nausea/dizziness    Convergence/Divergence: c/o diplopia at ~1.5 ft away    VESTIBULAR - OCULAR REFLEX:    Slow VOR: Comment: slow and inability to focus L eye; c/o dizziness in vertical direction   VOR Cancellation: Normal; c/o dizziness   Head-Impulse Test: HIT Right: positive HIT Left: slightly positive       POSITIONAL TESTING:  Right Roll Test: negative; c/o mild dizziness Left Roll Test: negative; c/o mild dizziness Right Dix-Hallpike: negative Left Dix-Hallpike: L upbeating torsional nystagmus; unable to tolerate full duration of test d/t nausea   VESTIBULAR TREATMENT:   PATIENT EDUCATION: Education details: prognosis, POC, HEP, provided edu and handout on vestibular hypofunction and BPPV Person educated: Patient Education method: Explanation, Tactile cues, Verbal cues, and Handouts Education comprehension: verbalized understanding   GOALS: Goals reviewed with patient? Yes  SHORT TERM GOALS: Target date: 04/05/2022  Patient to be independent with initial HEP. Baseline: HEP initiated Goal status: INITIAL    LONG TERM GOALS: Target date: 04/26/2022  Patient to be independent with advanced HEP. Baseline: Not yet initiated  Goal status: IN PROGRESS  Patient to report 0/10 dizziness with standing vertical and horizontal VOR for 30 seconds. Baseline: Unable Goal status: IN PROGRESS  Patient will report 0/10 dizziness with bed mobility.   Baseline: Symptomatic  Goal status: IN PROGRESS  Patient to demonstrate mild-moderate sway with M-CTSIB condition with eyes closed/foam surface in order to improve safety in environments with uneven surfaces and dim lighting. Baseline: NT Goal status: IN PROGRESS  Patient to score at least 20/24 on DGI in order to decrease risk of falls. Baseline: NT Goal status: IN PROGRESS  Patient will ambulate over outdoor surfaces with LRAD while performing head turns to scan environment with good stability in order to indicate safe community mobility. Baseline: Unable Goal status: IN PROGRESS   ASSESSMENT:  CLINICAL IMPRESSION: Patient arrived to session with report of improving dizziness. Retested positional testing which revealed L posterior canalithiasis vs. Cupulolithiasis. Treated with L Epley. Subsequent L sidelying test was negative however still symptomatic. D/t patient's nausea, updated HEP to increase challenge slightly and finished session early. Patient reported understanding of all edu provided.   OBJECTIVE IMPAIRMENTS Abnormal gait, decreased activity tolerance, decreased balance, dizziness, and pain.  ACTIVITY LIMITATIONS carrying, lifting, bending, sitting, standing, squatting, sleeping, stairs, transfers, bed mobility, bathing, dressing, and caring for others  PARTICIPATION LIMITATIONS: meal prep, cleaning, laundry, driving, shopping, community activity, occupation, yard work, and church  PERSONAL FACTORS Age, Past/current experiences, Profession, Time since onset of injury/illness/exacerbation, and 3+ comorbidities: anxiety, asthma, R bunionectomy  are also affecting patient's functional outcome.   REHAB POTENTIAL: Good  CLINICAL DECISION MAKING: Evolving/moderate complexity  EVALUATION COMPLEXITY: Moderate   PLAN: PT FREQUENCY: 1-2x/week  PT DURATION: 6 weeks  PLANNED INTERVENTIONS: Therapeutic exercises, Therapeutic activity, Neuromuscular re-education, Balance  training, Gait training, Patient/Family education, Self Care, Joint mobilization, Stair training, Vestibular training, Canalith repositioning, Dry Needling, Cryotherapy, Moist heat, Taping, Manual therapy, and Re-evaluation  PLAN FOR NEXT SESSION: reassess and treat L DH, DGI, M-CTSIB, reassess HEP with VOR training and habituation     Janene Harvey, PT, DPT 03/31/22 10:42 AM  [facility] at [facility] [address], [place], [place] [address] Phone # [phone] Fax # [phone]

## 2022-04-03 ENCOUNTER — Encounter: Payer: Self-pay | Admitting: Physical Therapy

## 2022-04-03 ENCOUNTER — Ambulatory Visit: Payer: 59 | Admitting: Physical Therapy

## 2022-04-03 DIAGNOSIS — R2681 Unsteadiness on feet: Secondary | ICD-10-CM

## 2022-04-03 DIAGNOSIS — H8112 Benign paroxysmal vertigo, left ear: Secondary | ICD-10-CM | POA: Diagnosis not present

## 2022-04-03 DIAGNOSIS — R42 Dizziness and giddiness: Secondary | ICD-10-CM | POA: Diagnosis not present

## 2022-04-05 ENCOUNTER — Ambulatory Visit: Payer: 59 | Admitting: Physical Therapy

## 2022-04-07 NOTE — Progress Notes (Signed)
OUTPATIENT PHYSICAL THERAPY VESTIBULAR TREATMENT     Patient Name: Anne Jefferson MRN: [id number] DOB:21-May-1976, 46 y.o., female Today's Date: 04/10/2022  PCP: Josetta Huddle, MD REFERRING PROVIDER: Kathalene Frames, MD   PT End of Session - 04/10/22 0828     Visit Number 5    Number of Visits 13    Date for PT Re-Evaluation 04/26/22    Authorization Type [employer]et[employer]    PT Start Time 0804    PT Stop Time 0827    PT Time Calculation (min) 23 min    Equipment Utilized During Treatment Gait belt    Activity Tolerance Other (comment);Patient tolerated treatment well   limited by nausea   Behavior During Therapy [facility] for tasks assessed/performed                 Past Medical History:  Diagnosis Date   Abnormal Pap smear    Anxiety    no meds   Asthma    mild, no issue for years no  inhaler use except in winter   Biliary colic    Blood type, Rh negative    Gallstones    H/O dysmenorrhea    Infertility, female    Nausea and vomiting    Pain    back and chest realted to gallbladder since november 2018   PONV (postoperative nausea and vomiting)    severe, needs heavy anti nausea meds   Spinal stenosis    Past Surgical History:  Procedure Laterality Date    c sections     x 2  3 births last was twins   BUNIONECTOMY Right    CHOLECYSTECTOMY N/A 11/22/2017   Procedure: [place];  Surgeon: Mickeal Skinner, MD;  Location: [facility];  Service: General;  Laterality: N/A;   LEEP  12/23/2009   WISDOM TOOTH EXTRACTION     Patient Active Problem List   Diagnosis Date Noted   Lightheadedness 02/03/2022   Anxiety 02/03/2022   Obesity (BMI 30-39.9) 02/03/2022   Abnormal TSH 02/03/2022   Cervical spondylosis with radiculopathy 02/02/2022   Rheumatoid arthritis ([place]) 02/02/2022   Adult stuttering 02/01/2022   History of nasal polyposis 11/17/2021   Acute cough 10/20/2021   Viral illness 10/20/2021   Moderate persistent asthma  without complication 41/63/8453   Moderate persistent asthma with (acute) exacerbation 11/16/2020   Shooting pain 07/29/2020   Atopic dermatitis 04/01/2019   Perennial and seasonal allergic rhinitis 09/17/2018   Acute non-recurrent frontal sinusitis 09/17/2018   Infertility, female    Abnormal Pap smear    H/O dysmenorrhea    Blood type, Rh negative    METATARSALGIA 12/18/2007   BUNIONS, LEFT FOOT 12/18/2007   UNEQUAL LEG LENGTH 12/18/2007   SPRAIN&STRAIN OF UNSPECIFIED SITE OF KNEE&LEG 11/13/2007    ONSET DATE: 02/01/22  REFERRING DIAG: R42 (ICD-10-CM) - Dizziness and giddiness  THERAPY DIAG:  BPPV (benign paroxysmal positional vertigo), left  Dizziness and giddiness  Unsteadiness on feet  Rationale for Evaluation and Treatment Rehabilitation  SUBJECTIVE:   SUBJECTIVE STATEMENT: Requesting to wrap up with PT as this is getting better and also difficult on her work schedule. Reports dizziness and nausea have improved; wondering if it is related to her pain.   Pt accompanied by: self  PERTINENT HISTORY: anxiety, asthma, R bunionectomy; per patient-  has "presumed RA"   PAIN:  Are you having pain? Yes: NPRS scale: 3/10 Pain location: B hands joints   PRECAUTIONS: Fall  PATIENT GOALS improve dizziness  OBJECTIVE:   TODAY'S TREATMENT: 04/10/22 Activity Comments  Standing VOR horizontal 2x30"  Cues to avoid high amplitude head movements to avoid eye strain; 4/10 nausea/dizziness  Standing VOR vertical 30" Report of 2-3/10 nausea  Standing on foam EC  Able to tolerate ~10 sec; c/o severe nausea  Standing on foam EO 30" Mild nausea/sway      HOME EXERCISE PROGRAM Last updated: 04/10/22 Access Code: [email or web address] URL: [email or web address] Date: 04/10/2022 Prepared by: [facility]  Exercises - Brandt-Daroff Vestibular Exercise  - 1 x daily - 5 x weekly - 2 sets - 3-5 reps - Standing with Head Rotation  - 1 x  daily - 5 x weekly - 2 sets - 30 sec hold - Standing with Head Nod  - 1 x daily - 5 x weekly - 2 sets - 30 sec hold - Standing Gaze Stabilization with Head Rotation  - 1 x daily - 5 x weekly - 2 sets - 30 sec hold - Standing Gaze Stabilization with Head Nod  - 1 x daily - 5 x weekly - 2 sets - 30 sec hold - Romberg Stance on Foam Pad  - 1 x daily - 5 x weekly - 2 sets - 30 sec hold - Standing Balance with Eyes Closed on Foam  - 1 x daily - 5 x weekly - 2 sets - 30 sec hold    PATIENT EDUCATION: Education details: HEP update and edu on how to progress exercises at home Person educated: Patient Education method: Explanation, Demonstration, Tactile cues, Verbal cues, and Handouts Education comprehension: verbalized understanding and returned demonstration   Below measures were taken at time of initial evaluation unless otherwise specified:    At rest:  98% SpO2, 74 bpm 130/83 mmHg  DIAGNOSTIC FINDINGS: 02/02/22 brain MRI: Normal brain MRI. No acute intracranial infarct or other abnormality.  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   Ocular ROM:  decreased inferior excursion of L eye    Spontaneous Nystagmus: absent   Gaze-Induced Nystagmus: absent   Smooth Pursuits:  L eye elevated with horizontal pursuit   Saccades: intact; slightly slow and c/o nausea/dizziness    Convergence/Divergence: c/o diplopia at ~1.5 ft away    VESTIBULAR - OCULAR REFLEX:    Slow VOR: Comment: slow and inability to  focus L eye; c/o dizziness in vertical direction   VOR Cancellation: Normal; c/o dizziness   Head-Impulse Test: HIT Right: positive HIT Left: slightly positive       POSITIONAL TESTING:  Right Roll Test: negative; c/o mild dizziness Left Roll Test: negative; c/o mild dizziness Right Dix-Hallpike: negative Left Dix-Hallpike: L upbeating torsional nystagmus; unable to tolerate full duration of test d/t nausea   VESTIBULAR TREATMENT:   PATIENT EDUCATION: Education details: prognosis, POC, HEP, provided edu and handout on vestibular hypofunction and BPPV Person educated: Patient Education method: Explanation, Tactile cues, Verbal cues, and Handouts Education comprehension: verbalized understanding   GOALS: Goals reviewed with patient? Yes  SHORT TERM GOALS: Target date: 04/05/2022  Patient to be independent with initial HEP. Baseline: HEP initiated Goal status: MET    LONG TERM GOALS: Target date: 04/26/2022  Patient to be independent with advanced HEP. Baseline: Not yet initiated  Goal status: IN PROGRESS  Patient to report 0/10 dizziness with standing vertical and horizontal VOR for 30 seconds. Baseline: Unable; 4/10 dizziness 04/10/22 Goal status: IN PROGRESS  Patient will report 0/10 dizziness with bed mobility.  Baseline: Symptomatic  Goal status: IN PROGRESS  Patient to demonstrate mild-moderate sway with M-CTSIB condition with eyes closed/foam surface in order to improve safety in environments with uneven surfaces and dim lighting. Baseline: NT; unable to tolerate 30 sec 04/10/22 Goal status: IN PROGRESS  Patient to score at least 20/24 on DGI in order to decrease risk of falls. Baseline: NT Goal status: MET 04/03/22  Patient will ambulate over outdoor surfaces with LRAD while performing head turns to scan environment with good stability in order to indicate safe community mobility. Baseline: Unable Goal status: IN PROGRESS   ASSESSMENT:  CLINICAL  IMPRESSION: Patient arrived to session with report of improving symptoms and request to wrap up with PT at this time. Reassessed VOR exercises with cueing for proper form; patient noted 3-4/10 dizziness and nausea with these activities. Also worked on balance on foam with EO and EC; EC version brought on severe nausea and patient was not able to tolerate remainder of session as a result. Upon further discussion, patient agreeable to PT hold and spacing out appointments d/t her work schedule. Patient reported nausea at end of session.     OBJECTIVE IMPAIRMENTS Abnormal gait, decreased activity tolerance, decreased balance, dizziness, and pain.   ACTIVITY LIMITATIONS carrying, lifting, bending, sitting, standing, squatting, sleeping, stairs, transfers, bed mobility, bathing, dressing, and caring for others  PARTICIPATION LIMITATIONS: meal prep, cleaning, laundry, driving, shopping, community activity, occupation, yard work, and church  PERSONAL FACTORS Age, Past/current experiences, Profession, Time since onset of injury/illness/exacerbation, and 3+ comorbidities: anxiety, asthma, R bunionectomy  are also affecting patient's functional outcome.   REHAB POTENTIAL: Good  CLINICAL DECISION MAKING: Evolving/moderate complexity  EVALUATION COMPLEXITY: Moderate   PLAN: PT FREQUENCY: 1-2x/week  PT DURATION: 6 weeks  PLANNED INTERVENTIONS: Therapeutic exercises, Therapeutic activity, Neuromuscular re-education, Balance training, Gait training, Patient/Family education, Self Care, Joint mobilization, Stair training, Vestibular training, Canalith repositioning, Dry Needling, Cryotherapy, Moist heat, Taping, Manual therapy, and Re-evaluation  PLAN FOR NEXT SESSION: reassess and treat L DH, reassess HEP with VOR training and habituation     Janene Harvey, PT, DPT 04/10/22 8:29 AM  [facility] [address], [place], [place]  [address] Phone # [phone] Fax # [phone]

## 2022-04-10 ENCOUNTER — Encounter: Payer: Self-pay | Admitting: Physical Therapy

## 2022-04-10 ENCOUNTER — Ambulatory Visit: Payer: 59 | Admitting: Physical Therapy

## 2022-04-10 DIAGNOSIS — R42 Dizziness and giddiness: Secondary | ICD-10-CM

## 2022-04-10 DIAGNOSIS — R2681 Unsteadiness on feet: Secondary | ICD-10-CM | POA: Diagnosis not present

## 2022-04-10 DIAGNOSIS — H8112 Benign paroxysmal vertigo, left ear: Secondary | ICD-10-CM | POA: Diagnosis not present

## 2022-04-12 ENCOUNTER — Ambulatory Visit: Payer: 59 | Admitting: Physical Therapy

## 2022-04-19 ENCOUNTER — Ambulatory Visit (INDEPENDENT_AMBULATORY_CARE_PROVIDER_SITE_OTHER): Payer: 59 | Admitting: *Deleted

## 2022-04-19 DIAGNOSIS — J309 Allergic rhinitis, unspecified: Secondary | ICD-10-CM

## 2022-04-26 ENCOUNTER — Ambulatory Visit: Payer: 59 | Admitting: Physical Therapy

## 2022-04-26 DIAGNOSIS — Z79899 Other long term (current) drug therapy: Secondary | ICD-10-CM | POA: Diagnosis not present

## 2022-04-26 DIAGNOSIS — R42 Dizziness and giddiness: Secondary | ICD-10-CM | POA: Diagnosis not present

## 2022-04-26 DIAGNOSIS — R768 Other specified abnormal immunological findings in serum: Secondary | ICD-10-CM | POA: Diagnosis not present

## 2022-04-26 DIAGNOSIS — M2550 Pain in unspecified joint: Secondary | ICD-10-CM | POA: Diagnosis not present

## 2022-04-27 NOTE — Progress Notes (Signed)
OUTPATIENT PHYSICAL THERAPY VESTIBULAR TREATMENT/RE-CERT     Patient Name: Anne Jefferson MRN: [id number] DOB:02/15/1976, 46 y.o., female Today's Date: 04/28/2022  PCP: Josetta Huddle, MD REFERRING PROVIDER: Kathalene Frames, MD   PT End of Session - 04/28/22 0830     Visit Number 6    Number of Visits 14    Date for PT Re-Evaluation 07/07/22    Authorization Type Aetna    PT Start Time 0803    PT Stop Time 0827    PT Time Calculation (min) 24 min    Activity Tolerance Other (comment)   limited by nausea   Behavior During Therapy [facility] for tasks assessed/performed                  Past Medical History:  Diagnosis Date   Abnormal Pap smear    Anxiety    no meds   Asthma    mild, no issue for years no  inhaler use except in winter   Biliary colic    Blood type, Rh negative    Gallstones    H/O dysmenorrhea    Infertility, female    Nausea and vomiting    Pain    back and chest realted to gallbladder since november 2018   PONV (postoperative nausea and vomiting)    severe, needs heavy anti nausea meds   Spinal stenosis    Past Surgical History:  Procedure Laterality Date    c sections     x 2  3 births last was twins   BUNIONECTOMY Right    CHOLECYSTECTOMY N/A 11/22/2017   Procedure: [place];  Surgeon: Mickeal Skinner, MD;  Location: [facility];  Service: General;  Laterality: N/A;   LEEP  12/23/2009   WISDOM TOOTH EXTRACTION     Patient Active Problem List   Diagnosis Date Noted   Lightheadedness 02/03/2022   Anxiety 02/03/2022   Obesity (BMI 30-39.9) 02/03/2022   Abnormal TSH 02/03/2022   Cervical spondylosis with radiculopathy 02/02/2022   Rheumatoid arthritis ([place]) 02/02/2022   Adult stuttering 02/01/2022   History of nasal polyposis 11/17/2021   Acute cough 10/20/2021   Viral illness 10/20/2021   Moderate persistent asthma without complication 40/04/2724   Moderate persistent asthma with  (acute) exacerbation 11/16/2020   Shooting pain 07/29/2020   Atopic dermatitis 04/01/2019   Perennial and seasonal allergic rhinitis 09/17/2018   Acute non-recurrent frontal sinusitis 09/17/2018   Infertility, female    Abnormal Pap smear    H/O dysmenorrhea    Blood type, Rh negative    METATARSALGIA 12/18/2007   BUNIONS, LEFT FOOT 12/18/2007   UNEQUAL LEG LENGTH 12/18/2007   SPRAIN&STRAIN OF UNSPECIFIED SITE OF KNEE&LEG 11/13/2007    ONSET DATE: 02/01/22  REFERRING DIAG: R42 (ICD-10-CM) - Dizziness and giddiness  THERAPY DIAG:  BPPV (benign paroxysmal positional vertigo), left  Dizziness and giddiness  Unsteadiness on feet  Rationale for Evaluation and Treatment Rehabilitation  SUBJECTIVE:   SUBJECTIVE STATEMENT: Patient reports that she was put on a new medication for her RA however unable to take Celebrex at the same time, so her pain levels have been worse. Has long standing problems with her spine which is affecting the R arm. Reports N/T down the R arm which has gotten worse recently. Went rock climbing recently. Not feeling well today. Dizziness has been getting better but still there.   Pt accompanied by: self  PERTINENT HISTORY: anxiety, asthma, R bunionectomy; per patient-  has "presumed RA"  PAIN:  Are you having pain? Yes: NPRS scale: 3/10 Pain location: B hands joints   PRECAUTIONS: Fall  PATIENT GOALS improve dizziness   OBJECTIVE:   TODAY'S TREATMENT: 04/28/22 Activity Comments  Standing horizontal VOR 30" 5-6/10 dizziness and nausea; slow and occasionally stopping, forward sway  Standing vertical VOR 30" 4-5/10 dizziness and nausea  Simulation of bed mobility 6-7/10 dizziness upon R sidelying to sit      M-CTSIB  Condition 1: Firm Surface, EO 30 Sec, Normal Sway  Condition 2: Firm Surface, EC 30 Sec, Moderate Sway  Condition 3: Foam Surface, EO NT Sec,  NT d/t nausea  Sway  Condition 4: Foam Surface, EC NT Sec,  NT d/t nausea  Sway      PATIENT EDUCATION: Education details: edu on need for new referral for neck pain, discussion on POC Person educated: Patient Education method: Explanation Education comprehension: verbalized understanding    HOME EXERCISE PROGRAM Last updated: 04/10/22 Access Code: [email or web address] URL: [email or web address] Date: 04/10/2022 Prepared by: [facility]  Exercises - Brandt-Daroff Vestibular Exercise  - 1 x daily - 5 x weekly - 2 sets - 3-5 reps - Standing with Head Rotation  - 1 x daily - 5 x weekly - 2 sets - 30 sec hold - Standing with Head Nod  - 1 x daily - 5 x weekly - 2 sets - 30 sec hold - Standing Gaze Stabilization with Head Rotation  - 1 x daily - 5 x weekly - 2 sets - 30 sec hold - Standing Gaze Stabilization with Head Nod  - 1 x daily - 5 x weekly - 2 sets - 30 sec hold - Romberg Stance on Foam Pad  - 1 x daily - 5 x weekly - 2 sets - 30 sec hold - Standing Balance with Eyes Closed on Foam  - 1 x daily - 5 x weekly - 2 sets - 30 sec hold     Below measures were taken at time of initial evaluation unless otherwise specified:    At rest:  98% SpO2, 74 bpm 130/83 mmHg  DIAGNOSTIC FINDINGS: 02/02/22 brain MRI: Normal brain MRI. No acute intracranial infarct or other abnormality.  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   Ocular ROM:  decreased inferior excursion of L eye    Spontaneous Nystagmus: absent   Gaze-Induced Nystagmus:  absent   Smooth Pursuits:  L eye elevated with horizontal pursuit   Saccades: intact; slightly slow and c/o nausea/dizziness    Convergence/Divergence: c/o diplopia at ~1.5 ft away    VESTIBULAR - OCULAR REFLEX:    Slow VOR: Comment: slow and inability to focus L eye; c/o dizziness in vertical direction   VOR Cancellation: Normal; c/o dizziness   Head-Impulse Test: HIT Right: positive HIT Left: slightly positive       POSITIONAL TESTING:  Right Roll Test: negative; c/o mild dizziness Left Roll Test: negative; c/o mild dizziness Right Dix-Hallpike: negative Left Dix-Hallpike: L upbeating torsional nystagmus; unable to tolerate full  duration of test d/t nausea   VESTIBULAR TREATMENT:   PATIENT EDUCATION: Education details: prognosis, POC, HEP, provided edu and handout on vestibular hypofunction and BPPV Person educated: Patient Education method: Explanation, Tactile cues, Verbal cues, and Handouts Education comprehension: verbalized understanding   GOALS: Goals reviewed with patient? Yes  SHORT TERM GOALS: Target date: 04/05/2022  Patient to be independent with initial HEP. Baseline: HEP initiated Goal status: MET    LONG TERM GOALS: Target date: 07/07/2022  Patient to be independent with advanced HEP. Baseline: Not yet initiated; met for current 04/28/22 Goal status: IN PROGRESS 04/28/22  Patient to report 0/10 dizziness with standing vertical and horizontal VOR for 30 seconds. Baseline: Unable; 4/10 dizziness 04/10/22, 4-6/10 dizziness 04/28/22 Goal status: IN PROGRESS 04/28/22  Patient will report 0/10 dizziness with bed mobility.  Baseline: Symptomatic; 6-7/10 dizziness 04/28/22 Goal status: IN PROGRESS 04/28/22  Patient to demonstrate mild-moderate sway with M-CTSIB condition with eyes closed/foam surface in order to improve safety in environments with uneven surfaces and dim lighting. Baseline: NT; unable to tolerate 30 sec 04/10/22; NT d/t nausea  04/28/22 Goal status: IN PROGRESS 04/28/22  Patient to score at least 20/24 on DGI in order to decrease risk of falls. Baseline: NT Goal status: MET 04/03/22  Patient will ambulate over outdoor surfaces with LRAD while performing head turns to scan environment with good stability in order to indicate safe community mobility. Baseline: Unable; unable to tolerated d/t nausea 04/28/22 Goal status: IN PROGRESS 04/28/22   ASSESSMENT:  CLINICAL IMPRESSION: Patient arrived to session with report of increased pain levels, particularly in the neck with radiation to the R UE with N/T. Reports that this is a long-standing issue which has recently worsened. Reports that dizziness has been getting better but is still there.Patient reports 4-6/10 dizziness with VOR exercise today and 6-7/10 dizziness with simulation of bed mobility. Demonstrates up to moderate sway with multisensory balance testing and entirety of test was not conducted d/t nausea. Gait with head turns was not assessed d/t severity of symptoms today, and session was cut short as a result. Patient is making slow but steady progress towards goals. Would benefit from additional skilled PT services 1x/week for 8 weeks.    OBJECTIVE IMPAIRMENTS Abnormal gait, decreased activity tolerance, decreased balance, dizziness, and pain.   ACTIVITY LIMITATIONS carrying, lifting, bending, sitting, standing, squatting, sleeping, stairs, transfers, bed mobility, bathing, dressing, and caring for others  PARTICIPATION LIMITATIONS: meal prep, cleaning, laundry, driving, shopping, community activity, occupation, yard work, and church  PERSONAL FACTORS Age, Past/current experiences, Profession, Time since onset of injury/illness/exacerbation, and 3+ comorbidities: anxiety, asthma, R bunionectomy  are also affecting patient's functional outcome.   REHAB POTENTIAL: Good  CLINICAL DECISION MAKING: Evolving/moderate complexity  EVALUATION COMPLEXITY:  Moderate   PLAN: PT FREQUENCY: 1x/week  PT DURATION: 8 weeks  PLANNED INTERVENTIONS: Therapeutic exercises, Therapeutic activity, Neuromuscular re-education, Balance training, Gait training, Patient/Family education, Self Care, Joint mobilization, Stair training, Vestibular training, Canalith repositioning, Dry Needling, Cryotherapy, Moist heat, Taping, Manual therapy, and Re-evaluation  PLAN FOR NEXT SESSION: progress VOR training and habituation, assess neck once referral is received     Janene Harvey, PT, DPT 04/28/22 8:41 AM  [facility] at [facility] [address] [place], [place] [address] Phone # [phone] Fax # [phone]

## 2022-04-28 ENCOUNTER — Ambulatory Visit: Payer: 59 | Attending: Internal Medicine | Admitting: Physical Therapy

## 2022-04-28 ENCOUNTER — Encounter: Payer: Self-pay | Admitting: Physical Therapy

## 2022-04-28 DIAGNOSIS — R42 Dizziness and giddiness: Secondary | ICD-10-CM | POA: Diagnosis not present

## 2022-04-28 DIAGNOSIS — R2681 Unsteadiness on feet: Secondary | ICD-10-CM | POA: Insufficient documentation

## 2022-04-28 DIAGNOSIS — H8112 Benign paroxysmal vertigo, left ear: Secondary | ICD-10-CM | POA: Insufficient documentation

## 2022-05-04 ENCOUNTER — Ambulatory Visit (INDEPENDENT_AMBULATORY_CARE_PROVIDER_SITE_OTHER): Payer: 59

## 2022-05-04 DIAGNOSIS — J309 Allergic rhinitis, unspecified: Secondary | ICD-10-CM

## 2022-05-11 ENCOUNTER — Telehealth: Payer: 59 | Admitting: Family Medicine

## 2022-05-12 ENCOUNTER — Encounter: Payer: Self-pay | Admitting: Internal Medicine

## 2022-05-12 ENCOUNTER — Ambulatory Visit: Payer: 59 | Admitting: Physical Therapy

## 2022-05-12 ENCOUNTER — Ambulatory Visit: Payer: 59 | Admitting: Internal Medicine

## 2022-05-12 DIAGNOSIS — J45909 Unspecified asthma, uncomplicated: Secondary | ICD-10-CM | POA: Diagnosis not present

## 2022-05-12 DIAGNOSIS — J069 Acute upper respiratory infection, unspecified: Secondary | ICD-10-CM | POA: Diagnosis not present

## 2022-05-12 DIAGNOSIS — J3089 Other allergic rhinitis: Secondary | ICD-10-CM

## 2022-05-12 NOTE — Patient Instructions (Signed)
Asthma - Continue Breztri 2 puffs twice a day with a spacer to prevent cough or wheeze - Continue albuterol 2 puffs once every 4 hours as needed for cough or wheeze or prior to activity.    Allergic rhinitis Viral URI - Start nasal saline rinses before nose sprays such as with Neilmed Sinus Rinse.  Use distilled water.   - Continue Nasonex Flonase 1 sprays each nostril twice daily. Aim upward and outward. - Start Azelastine 1-2 sprays each nostril twice daily. Aim upward and outward. - Use Allegra '180mg'$  daily.  - Use Mucinex as needed.

## 2022-05-12 NOTE — Progress Notes (Addendum)
RE: Anne Jefferson MRN: [id number] DOB: 1975/11/30 Date of Telemedicine Visit: 05/12/2022  Referring provider: Josetta Huddle, MD Primary care provider: Josetta Huddle, MD  Chief Complaint: Follow-up ([place]. Two days after arriving in [place] she is feeling bad. Low grade fever which always happens with bad allergies. Having aches doing the day and at night but worse at night time. Post nasal drip. Patient states that it isn't COVID bc she works in the Physicist, medical and knows.Marland KitchenMarland KitchenMarland KitchenMarland Kitchen)   Telemedicine Follow Up Visit via Telephone: I connected with Anne Jefferson for a follow up on 05/12/22 by telephone and verified that I am speaking with the correct person using two identifiers.   I discussed the limitations, risks, security and privacy concerns of performing an evaluation and management service by telephone and the availability of in person appointments. I also discussed with the patient that there may be a patient responsible charge related to this service. The patient expressed understanding and agreed to proceed.  Patient is at home. Provider is at the office.  Visit start time: 11:11 PM Visit end time: 11:26 PM Insurance consent/check in by: [employer] consent and medical assistant/nurse: Trayce  History of Present Illness:  She is a 46 y.o. female, who is being followed for asthma and allergic rhinitis. Her previous allergy office visit was in April 2023 with Gareth Morgan, [place].  She is here today for an acute visit.  Reports she is in [place] and soon driving to [place] on vacation.  For the past few days, she has had a lot of congestion, runny nose with yellow mucous, achy, low grade fevers.  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  She is taking Nasonex 1 SEN BID and Allegra daily with minimal relief.  Symptoms worsen in the morning.  Initially, she though her asthma might be acting up with it but now feels better  after using her Breztri daily.  She is hiking around and does better during the day when she is out.  Denies coughing/wheezing/SOB.  Otherwise, there have been no changes to her past medical history, surgical history, family history, or social history.  Assessment and Plan:  Anne Jefferson is a 46 y.o. female with:  Asthma - Continue Breztri 2 puffs twice a day with a spacer to prevent cough or wheeze - Continue albuterol 2 puffs once every 4 hours as needed for cough or wheeze or prior to activity.    Allergic rhinitis Viral URI - Symptoms might be due to a viral URI or exacerbation of allergies. - Start nasal saline rinses before nose sprays such as with Neilmed Sinus Rinse.  Use distilled water.   - Continue Nasonex Flonase 1 sprays each nostril twice daily. Aim upward and outward. - Start Azelastine 1-2 sprays each nostril twice daily. Aim upward and outward. - Use Allegra '180mg'$  daily.  - Use Mucinex as needed.    Diagnostics: None.  Medication List:  Current Outpatient Medications  Medication Sig Dispense Refill   albuterol (PROVENTIL HFA) 108 (90 Base) MCG/ACT inhaler Inhale 2 puffs into the lungs every 4 (four) hours as needed for wheezing or shortness of breath. 18 g 1   albuterol (PROVENTIL) (2.5 MG/3ML) 0.083% nebulizer solution Use 1 unit dose via the nebulizer every 6 hours as needed for cough, wheeze, tightness in chest, or shortness of breath. 75 mL 3   BREZTRI AEROSPHERE 160-9-4.8 MCG/ACT AERO Inhale 2 puffs into the lungs in the  morning and at bedtime. 10.7 g 5   celecoxib (CELEBREX) 100 MG capsule Take 100 mg by mouth 2 (two) times daily.     EPINEPHrine (AUVI-Q) 0.3 mg/0.3 mL IJ SOAJ injection Inject 0.3 mg into the muscle as needed for anaphylaxis. 2 each 1   fexofenadine (ALLEGRA) 180 MG tablet Take 180 mg by mouth 2 (two) times daily.     folic acid (FOLVITE) 1 MG tablet Take 1 mg by mouth daily. *patient unsure of dosage     gabapentin (NEURONTIN) 300 MG capsule  Take 300 mg by mouth 3 (three) times daily.     hydroxychloroquine (PLAQUENIL) 200 MG tablet Take 200 mg by mouth 2 (two) times daily.     methotrexate (RHEUMATREX) 2.5 MG tablet Take 10 mg by mouth once a week. Caution:Chemotherapy. Protect from light.     mometasone (NASONEX) 50 MCG/ACT nasal spray Place 1 spray into the nose daily. (Patient taking differently: Place 1 spray into the nose 2 (two) times daily.) 1 each 3   omeprazole (PRILOSEC OTC) 20 MG tablet Take 1 tablet (20 mg total) by mouth daily. 30 tablet 1   predniSONE (DELTASONE) 20 MG tablet Taper as prescribed by rheumatology     traMADol (ULTRAM) 50 MG tablet Take 100 mg by mouth 1 day or 1 dose.     UNABLE TO FIND 2 (two) times a week. Med Name: ALLERGY SHOTS     VITAMIN D PO Take by mouth daily.     meclizine (ANTIVERT) 25 MG tablet Take 1 tablet (25 mg total) by mouth 3 (three) times daily as needed for dizziness. (Patient not taking: Reported on 05/12/2022) 30 tablet 0   No current facility-administered medications for this visit.   Allergies: Allergies  Allergen Reactions   Promethazine Other (See Comments)    Hallucinations   Theophyllines Nausea And Vomiting   Mite (D. Farinae) Other (See Comments)    Cats, dogs, dust    Molds & Smuts Other (See Comments)   I reviewed her past medical history, social history, family history, and environmental history and no significant changes have been reported from previous visits.  Review of Systems  Objective:  Physical exam not obtained as encounter was done via telephone.   Previous notes and tests were reviewed.  I discussed the assessment and treatment plan with the patient. The patient was provided an opportunity to ask questions and all were answered. The patient agreed with the plan and demonstrated an understanding of the instructions.   The patient was advised to call back or seek an in-person evaluation if the symptoms worsen or if the condition fails to improve  as anticipated.  I provided 15 minutes of non-face-to-face time during this encounter.  Harlon Flor, MD [facility]

## 2022-05-13 ENCOUNTER — Other Ambulatory Visit: Payer: Self-pay | Admitting: Allergy

## 2022-05-13 NOTE — Progress Notes (Signed)
Called by pt.  She had a televist yesterday with Dr Posey Pronto. She states she is in [place] now. She was in [place].  She states now symptoms seem to be worsening that is worse in the morning and nights.  States low grade fever.  She believes it is her allergies.  She was advised to use azelastine. She is taking tylenol.   She flies out tonight.  She is miserable and wants some relief. She is Nurse, adult.  She has RA and is on prednisone '15mg'$  daily at baseline and states she has extra tabs.   I advised for quicker relief of symptoms especially with close travel is to either increase her prednisone to '30mg'$  for next several days and then can go back to her regular dosing.  Also could do a decongestant.  She voiced will increase her prednisone temporarily to get to her next destination.

## 2022-05-15 ENCOUNTER — Telehealth: Payer: Self-pay

## 2022-05-15 ENCOUNTER — Encounter: Payer: Self-pay | Admitting: Family Medicine

## 2022-05-15 NOTE — Telephone Encounter (Signed)
Patient called in stating that she has COVID and wanted to know if there were anything special she needed to do due to having Asthma. Advised patient that Webb Silversmith suggested doing a televisit with her to address this issue but patient declined because she was just charged $50 this weekend for a televisit. Patient stated that she would rather send a mychart message for the advise than be charged again for another televisit so soon.

## 2022-05-15 NOTE — Telephone Encounter (Signed)
Can you please let this patient know to pretreat with albuterol about 5-15 minutes before Breztri for the next few days to help with that wheezing. Also do not come for the allergy injection until breathing has returned to baseline. Continue with nasal saline rinses, Flonase and take mucinex [phone] mg twice a day to help thin out that mucus and get it drained out. Stop taking antihistamine for the next 5 days or so if you can. Please call the clinic with any questions. Thank you

## 2022-05-16 ENCOUNTER — Ambulatory Visit: Payer: 59 | Admitting: Podiatry

## 2022-05-16 ENCOUNTER — Other Ambulatory Visit: Payer: Self-pay

## 2022-05-16 MED ORDER — BREZTRI AEROSPHERE 160-9-4.8 MCG/ACT IN AERO: 2.0000 | INHALATION_SPRAY | Freq: Two times a day (BID) | RESPIRATORY_TRACT | 1 refills | Status: DC

## 2022-05-16 MED ORDER — ALBUTEROL SULFATE HFA 108 (90 BASE) MCG/ACT IN AERS: 2.0000 | INHALATION_SPRAY | RESPIRATORY_TRACT | 0 refills | Status: DC | PRN

## 2022-05-16 NOTE — Telephone Encounter (Signed)
Per [employer] message patient is to continue Breztri and Albuterol. Patient request a refill be sent to [facility] in [facility] with a 90 day supply. Refills have been sent to the requested pharmacy.

## 2022-05-18 NOTE — Progress Notes (Incomplete)
OUTPATIENT PHYSICAL THERAPY VESTIBULAR TREATMENT     Patient Name: Anne Jefferson MRN: [id number] DOB:1976/04/08, 46 y.o., female Today's Date: 05/18/2022  PCP: Marden Noble, MD REFERRING PROVIDER: Emilio Aspen, MD          Past Medical History:  Diagnosis Date   Abnormal Pap smear    Anxiety    no meds   Asthma    mild, no issue for years no  inhaler use except in winter   Biliary colic    Blood type, Rh negative    Gallstones    H/O dysmenorrhea    Infertility, female    Nausea and vomiting    Pain    back and chest realted to gallbladder since november 2018   PONV (postoperative nausea and vomiting)    severe, needs heavy anti nausea meds   Spinal stenosis    Past Surgical History:  Procedure Laterality Date    c sections     x 2  3 births last was twins   BUNIONECTOMY Right    CHOLECYSTECTOMY N/A 11/22/2017   Procedure: LAPAROSCOPIC CHOLECYSTECTOMY ERAS PATHWAY;  Surgeon: Rodman Pickle, MD;  Location: [facility];  Service: General;  Laterality: N/A;   LEEP  12/23/2009   WISDOM TOOTH EXTRACTION     Patient Active Problem List   Diagnosis Date Noted   Lightheadedness 02/03/2022   Anxiety 02/03/2022   Obesity (BMI 30-39.9) 02/03/2022   Abnormal TSH 02/03/2022   Cervical spondylosis with radiculopathy 02/02/2022   Rheumatoid arthritis (HCC) 02/02/2022   Adult stuttering 02/01/2022   History of nasal polyposis 11/17/2021   Acute cough 10/20/2021   Viral illness 10/20/2021   Moderate persistent asthma without complication 12/23/2020   Moderate persistent asthma with (acute) exacerbation 11/16/2020   Shooting pain 07/29/2020   Atopic dermatitis 04/01/2019   Perennial and seasonal allergic rhinitis 09/17/2018   Acute non-recurrent frontal sinusitis 09/17/2018   Infertility, female    Abnormal Pap smear    H/O dysmenorrhea    Blood type, Rh negative    METATARSALGIA 12/18/2007   BUNIONS, LEFT FOOT 12/18/2007   UNEQUAL LEG  LENGTH 12/18/2007   SPRAIN&STRAIN OF UNSPECIFIED SITE OF KNEE&LEG 11/13/2007    ONSET DATE: 02/01/22  REFERRING DIAG: R42 (ICD-10-CM) - Dizziness and giddiness  THERAPY DIAG:  No diagnosis found.  Rationale for Evaluation and Treatment Rehabilitation  SUBJECTIVE:   SUBJECTIVE STATEMENT: Patient reports that she was put on a new medication for her RA however unable to take Celebrex at the same time, so her pain levels have been worse. Has long standing problems with her spine which is affecting the R arm. Reports N/T down the R arm which has gotten worse recently. Went rock climbing recently. Not feeling well today. Dizziness has been getting better but still there.   Pt accompanied by: self  PERTINENT HISTORY: anxiety, asthma, R bunionectomy; per patient-  has "presumed RA"   PAIN:  Are you having pain? Yes: NPRS scale: 3/10 Pain location: B hands joints   PRECAUTIONS: Fall  PATIENT GOALS improve dizziness   OBJECTIVE:    TODAY'S TREATMENT: 05/19/22   CERVICAL ROM:   Active ROM A/PROM (deg) eval  Flexion   Extension   Right lateral flexion   Left lateral flexion   Right rotation   Left rotation    (Blank rows = not tested)   UPPER EXTREMITY MMT:  MMT Right eval Left eval  Shoulder flexion    Shoulder extension    Shoulder abduction  Shoulder adduction    Shoulder extension    Shoulder internal rotation    Shoulder external rotation    Middle trapezius    Lower trapezius    Elbow flexion    Elbow extension    Wrist flexion    Wrist extension    Wrist ulnar deviation    Wrist radial deviation    Wrist pronation    Wrist supination    Grip strength     (Blank rows = not tested)    Activity Comments  Palpation                        HOME EXERCISE PROGRAM Last updated: 04/10/22 Access Code: [email or web address] URL: [email or web address] Date: 04/10/2022 Prepared by: [facility]  Exercises - Brandt-Daroff Vestibular Exercise  - 1 x daily - 5 x weekly - 2 sets - 3-5 reps - Standing with Head Rotation  - 1 x daily - 5 x weekly - 2 sets - 30 sec hold - Standing with Head Nod  - 1 x daily - 5 x weekly - 2 sets - 30 sec hold - Standing Gaze Stabilization with Head Rotation  - 1 x daily - 5 x weekly - 2 sets - 30 sec hold - Standing Gaze Stabilization with Head Nod  - 1 x daily - 5 x weekly - 2 sets - 30 sec hold - Romberg Stance on Foam Pad  - 1 x daily - 5 x weekly - 2 sets - 30 sec hold - Standing Balance with Eyes Closed on Foam  - 1 x daily - 5 x weekly - 2 sets - 30 sec hold     Below measures were taken at time of initial evaluation unless otherwise specified:    At rest:  98% SpO2, 74 bpm 130/83 mmHg  DIAGNOSTIC FINDINGS: 02/02/22 brain MRI: Normal brain MRI. No acute intracranial infarct or other abnormality.  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   Ocular ROM:  decreased inferior excursion of L eye    Spontaneous Nystagmus: absent   Gaze-Induced Nystagmus: absent   Smooth Pursuits:  L eye elevated with horizontal pursuit   Saccades: intact; slightly slow and c/o nausea/dizziness    Convergence/Divergence: c/o diplopia at ~1.5 ft away    VESTIBULAR - OCULAR REFLEX:    Slow VOR: Comment: slow and inability to focus L eye; c/o dizziness in vertical direction   VOR Cancellation: Normal; c/o dizziness   Head-Impulse Test: HIT Right: positive HIT Left:  slightly positive       POSITIONAL TESTING:  Right Roll Test: negative; c/o mild dizziness Left Roll Test: negative; c/o mild dizziness Right Dix-Hallpike: negative Left Dix-Hallpike: L upbeating torsional nystagmus; unable to tolerate full duration of test d/t nausea   VESTIBULAR TREATMENT:   PATIENT EDUCATION: Education details: prognosis, POC, HEP, provided edu and handout on vestibular hypofunction and BPPV Person educated: Patient Education method: Explanation, Corporate treasurer cues, Verbal cues, and Handouts Education comprehension: verbalized  understanding   GOALS: Goals reviewed with patient? Yes  SHORT TERM GOALS: Target date: 04/05/2022  Patient to be independent with initial HEP. Baseline: HEP initiated Goal status: MET    LONG TERM GOALS: Target date: 07/07/2022  Patient to be independent with advanced HEP. Baseline: Not yet initiated; met for current 04/28/22 Goal status: IN PROGRESS 04/28/22  Patient to report 0/10 dizziness with standing vertical and horizontal VOR for 30 seconds. Baseline: Unable; 4/10 dizziness 04/10/22, 4-6/10 dizziness 04/28/22 Goal status: IN PROGRESS 04/28/22  Patient will report 0/10 dizziness with bed mobility.  Baseline: Symptomatic; 6-7/10 dizziness 04/28/22 Goal status: IN PROGRESS 04/28/22  Patient to demonstrate mild-moderate sway with M-CTSIB condition with eyes closed/foam surface in order to improve safety in environments with uneven surfaces and dim lighting. Baseline: NT; unable to tolerate 30 sec 04/10/22; NT d/t nausea 04/28/22 Goal status: IN PROGRESS 04/28/22  Patient to score at least 20/24 on DGI in order to decrease risk of falls. Baseline: NT Goal status: MET 04/03/22  Patient will ambulate over outdoor surfaces with LRAD while performing head turns to scan environment with good stability in order to indicate safe community mobility. Baseline: Unable; unable to tolerated d/t nausea 04/28/22 Goal status: IN PROGRESS  04/28/22   ASSESSMENT:  CLINICAL IMPRESSION: Patient arrived to session with report of increased pain levels, particularly in the neck with radiation to the R UE with N/T. Reports that this is a long-standing issue which has recently worsened. Reports that dizziness has been getting better but is still there.Patient reports 4-6/10 dizziness with VOR exercise today and 6-7/10 dizziness with simulation of bed mobility. Demonstrates up to moderate sway with multisensory balance testing and entirety of test was not conducted d/t nausea. Gait with head turns was not assessed d/t severity of symptoms today, and session was cut short as a result. Patient is making slow but steady progress towards goals. Would benefit from additional skilled PT services 1x/week for 8 weeks.    OBJECTIVE IMPAIRMENTS Abnormal gait, decreased activity tolerance, decreased balance, dizziness, and pain.   ACTIVITY LIMITATIONS carrying, lifting, bending, sitting, standing, squatting, sleeping, stairs, transfers, bed mobility, bathing, dressing, and caring for others  PARTICIPATION LIMITATIONS: meal prep, cleaning, laundry, driving, shopping, community activity, occupation, yard work, and church  PERSONAL FACTORS Age, Past/current experiences, Profession, Time since onset of injury/illness/exacerbation, and 3+ comorbidities: anxiety, asthma, R bunionectomy  are also affecting patient's functional outcome.   REHAB POTENTIAL: Good  CLINICAL DECISION MAKING: Evolving/moderate complexity  EVALUATION COMPLEXITY: Moderate   PLAN: PT FREQUENCY: 1x/week  PT DURATION: 8 weeks  PLANNED INTERVENTIONS: Therapeutic exercises, Therapeutic activity, Neuromuscular re-education, Balance training, Gait training, Patient/Family education, Self Care, Joint mobilization, Stair training, Vestibular training, Canalith repositioning, Dry Needling, Cryotherapy, Moist heat, Taping, Manual therapy, and Re-evaluation  PLAN FOR NEXT SESSION:  progress VOR training and habituation, assess neck once referral is received     Janene Harvey, PT, DPT 05/18/22 7:54 AM  [facility] [address] [place],  [address] Phone # [phone] Fax # [phone]

## 2022-05-19 ENCOUNTER — Ambulatory Visit: Payer: 59 | Admitting: Physical Therapy

## 2022-05-23 ENCOUNTER — Ambulatory Visit (INDEPENDENT_AMBULATORY_CARE_PROVIDER_SITE_OTHER): Payer: 59 | Admitting: *Deleted

## 2022-05-23 DIAGNOSIS — J309 Allergic rhinitis, unspecified: Secondary | ICD-10-CM

## 2022-05-24 DIAGNOSIS — R109 Unspecified abdominal pain: Secondary | ICD-10-CM | POA: Diagnosis not present

## 2022-05-24 DIAGNOSIS — R300 Dysuria: Secondary | ICD-10-CM | POA: Diagnosis not present

## 2022-05-25 ENCOUNTER — Other Ambulatory Visit: Payer: Self-pay | Admitting: Internal Medicine

## 2022-05-25 DIAGNOSIS — R10814 Left lower quadrant abdominal tenderness: Secondary | ICD-10-CM

## 2022-05-25 DIAGNOSIS — R10A1 Flank pain, right side: Secondary | ICD-10-CM

## 2022-05-25 DIAGNOSIS — R109 Unspecified abdominal pain: Secondary | ICD-10-CM

## 2022-05-25 NOTE — Progress Notes (Incomplete)
OUTPATIENT PHYSICAL THERAPY VESTIBULAR TREATMENT     Patient Name: Anne Jefferson MRN: [id number] DOB:1976-05-28, 46 y.o., female Today's Date: 05/25/2022  PCP: Josetta Huddle, MD REFERRING PROVIDER: Kathalene Frames, MD          Past Medical History:  Diagnosis Date   Abnormal Pap smear    Anxiety    no meds   Asthma    mild, no issue for years no  inhaler use except in winter   Biliary colic    Blood type, Rh negative    Gallstones    H/O dysmenorrhea    Infertility, female    Nausea and vomiting    Pain    back and chest realted to gallbladder since november 2018   PONV (postoperative nausea and vomiting)    severe, needs heavy anti nausea meds   Spinal stenosis    Past Surgical History:  Procedure Laterality Date    c sections     x 2  3 births last was twins   BUNIONECTOMY Right    CHOLECYSTECTOMY N/A 11/22/2017   Procedure: Cesar Chavez;  Surgeon: Mickeal Skinner, MD;  Location: [facility];  Service: General;  Laterality: N/A;   LEEP  12/23/2009   WISDOM TOOTH EXTRACTION     Patient Active Problem List   Diagnosis Date Noted   Lightheadedness 02/03/2022   Anxiety 02/03/2022   Obesity (BMI 30-39.9) 02/03/2022   Abnormal TSH 02/03/2022   Cervical spondylosis with radiculopathy 02/02/2022   Rheumatoid arthritis ([place]) 02/02/2022   Adult stuttering 02/01/2022   History of nasal polyposis 11/17/2021   Acute cough 10/20/2021   Viral illness 10/20/2021   Moderate persistent asthma without complication 40/98/1191   Moderate persistent asthma with (acute) exacerbation 11/16/2020   Shooting pain 07/29/2020   Atopic dermatitis 04/01/2019   Perennial and seasonal allergic rhinitis 09/17/2018   Acute non-recurrent frontal sinusitis 09/17/2018   Infertility, female    Abnormal Pap smear    H/O dysmenorrhea    Blood type, Rh negative    METATARSALGIA 12/18/2007   BUNIONS, LEFT FOOT 12/18/2007   UNEQUAL LEG  LENGTH 12/18/2007   SPRAIN&STRAIN OF UNSPECIFIED SITE OF KNEE&LEG 11/13/2007    ONSET DATE: 02/01/22  REFERRING DIAG: R42 (ICD-10-CM) - Dizziness and giddiness  THERAPY DIAG:  No diagnosis found.  Rationale for Evaluation and Treatment Rehabilitation  SUBJECTIVE:   SUBJECTIVE STATEMENT: Patient reports that she was put on a new medication for her RA however unable to take Celebrex at the same time, so her pain levels have been worse. Has long standing problems with her spine which is affecting the R arm. Reports N/T down the R arm which has gotten worse recently. Went rock climbing recently. Not feeling well today. Dizziness has been getting better but still there.   Pt accompanied by: self  PERTINENT HISTORY: anxiety, asthma, R bunionectomy; per patient-  has "presumed RA"   PAIN:  Are you having pain? Yes: NPRS scale: 3/10 Pain location: B hands joints   PRECAUTIONS: Fall  PATIENT GOALS improve dizziness   OBJECTIVE:    TODAY'S TREATMENT: 05/26/22   CERVICAL ROM:   Active ROM A/PROM (deg) eval  Flexion   Extension   Right lateral flexion   Left lateral flexion   Right rotation   Left rotation    (Blank rows = not tested)   UPPER EXTREMITY MMT:  MMT Right eval Left eval  Shoulder flexion    Shoulder extension    Shoulder abduction  Shoulder adduction    Shoulder extension    Shoulder internal rotation    Shoulder external rotation    Middle trapezius    Lower trapezius    Elbow flexion    Elbow extension    Wrist flexion    Wrist extension    Wrist ulnar deviation    Wrist radial deviation    Wrist pronation    Wrist supination    Grip strength     (Blank rows = not tested)    Activity Comments  Palpation                        HOME EXERCISE PROGRAM Last updated: 04/10/22 Access Code: [email or web address] URL: [email or web address] Date: 04/10/2022 Prepared by: [facility]  Exercises - Brandt-Daroff Vestibular Exercise  - 1 x daily - 5 x weekly - 2 sets - 3-5 reps - Standing with Head Rotation  - 1 x daily - 5 x weekly - 2 sets - 30 sec hold - Standing with Head Nod  - 1 x daily - 5 x weekly - 2 sets - 30 sec hold - Standing Gaze Stabilization with Head Rotation  - 1 x daily - 5 x weekly - 2 sets - 30 sec hold - Standing Gaze Stabilization with Head Nod  - 1 x daily - 5 x weekly - 2 sets - 30 sec hold - Romberg Stance on Foam Pad  - 1 x daily - 5 x weekly - 2 sets - 30 sec hold - Standing Balance with Eyes Closed on Foam  - 1 x daily - 5 x weekly - 2 sets - 30 sec hold     Below measures were taken at time of initial evaluation unless otherwise specified:    At rest:  98% SpO2, 74 bpm 130/83 mmHg  DIAGNOSTIC FINDINGS: 02/02/22 brain MRI: Normal brain MRI. No acute intracranial infarct or other abnormality.  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   Ocular ROM:  decreased inferior excursion of L eye    Spontaneous Nystagmus: absent   Gaze-Induced Nystagmus: absent   Smooth Pursuits:  L eye elevated with horizontal pursuit   Saccades: intact; slightly slow and c/o nausea/dizziness    Convergence/Divergence: c/o diplopia at ~1.5 ft away    VESTIBULAR - OCULAR REFLEX:    Slow VOR: Comment: slow and inability to focus L eye; c/o dizziness in vertical direction   VOR Cancellation: Normal; c/o dizziness   Head-Impulse Test: HIT Right: positive HIT Left:  slightly positive       POSITIONAL TESTING:  Right Roll Test: negative; c/o mild dizziness Left Roll Test: negative; c/o mild dizziness Right Dix-Hallpike: negative Left Dix-Hallpike: L upbeating torsional nystagmus; unable to tolerate full duration of test d/t nausea   VESTIBULAR TREATMENT:   PATIENT EDUCATION: Education details: prognosis, POC, HEP, provided edu and handout on vestibular hypofunction and BPPV Person educated: Patient Education method: Explanation, Corporate treasurer cues, Verbal cues, and Handouts Education comprehension: verbalized  understanding   GOALS: Goals reviewed with patient? Yes  SHORT TERM GOALS: Target date: 04/05/2022  Patient to be independent with initial HEP. Baseline: HEP initiated Goal status: MET    LONG TERM GOALS: Target date: 07/07/2022  Patient to be independent with advanced HEP. Baseline: Not yet initiated; met for current 04/28/22 Goal status: IN PROGRESS 04/28/22  Patient to report 0/10 dizziness with standing vertical and horizontal VOR for 30 seconds. Baseline: Unable; 4/10 dizziness 04/10/22, 4-6/10 dizziness 04/28/22 Goal status: IN PROGRESS 04/28/22  Patient will report 0/10 dizziness with bed mobility.  Baseline: Symptomatic; 6-7/10 dizziness 04/28/22 Goal status: IN PROGRESS 04/28/22  Patient to demonstrate mild-moderate sway with M-CTSIB condition with eyes closed/foam surface in order to improve safety in environments with uneven surfaces and dim lighting. Baseline: NT; unable to tolerate 30 sec 04/10/22; NT d/t nausea 04/28/22 Goal status: IN PROGRESS 04/28/22  Patient to score at least 20/24 on DGI in order to decrease risk of falls. Baseline: NT Goal status: MET 04/03/22  Patient will ambulate over outdoor surfaces with LRAD while performing head turns to scan environment with good stability in order to indicate safe community mobility. Baseline: Unable; unable to tolerated d/t nausea 04/28/22 Goal status: IN PROGRESS  04/28/22   ASSESSMENT:  CLINICAL IMPRESSION: Patient arrived to session with report of increased pain levels, particularly in the neck with radiation to the R UE with N/T. Reports that this is a long-standing issue which has recently worsened. Reports that dizziness has been getting better but is still there.Patient reports 4-6/10 dizziness with VOR exercise today and 6-7/10 dizziness with simulation of bed mobility. Demonstrates up to moderate sway with multisensory balance testing and entirety of test was not conducted d/t nausea. Gait with head turns was not assessed d/t severity of symptoms today, and session was cut short as a result. Patient is making slow but steady progress towards goals. Would benefit from additional skilled PT services 1x/week for 8 weeks.    OBJECTIVE IMPAIRMENTS Abnormal gait, decreased activity tolerance, decreased balance, dizziness, and pain.   ACTIVITY LIMITATIONS carrying, lifting, bending, sitting, standing, squatting, sleeping, stairs, transfers, bed mobility, bathing, dressing, and caring for others  PARTICIPATION LIMITATIONS: meal prep, cleaning, laundry, driving, shopping, community activity, occupation, yard work, and church  PERSONAL FACTORS Age, Past/current experiences, Profession, Time since onset of injury/illness/exacerbation, and 3+ comorbidities: anxiety, asthma, R bunionectomy  are also affecting patient's functional outcome.   REHAB POTENTIAL: Good  CLINICAL DECISION MAKING: Evolving/moderate complexity  EVALUATION COMPLEXITY: Moderate   PLAN: PT FREQUENCY: 1x/week  PT DURATION: 8 weeks  PLANNED INTERVENTIONS: Therapeutic exercises, Therapeutic activity, Neuromuscular re-education, Balance training, Gait training, Patient/Family education, Self Care, Joint mobilization, Stair training, Vestibular training, Canalith repositioning, Dry Needling, Cryotherapy, Moist heat, Taping, Manual therapy, and Re-evaluation  PLAN FOR NEXT SESSION:  progress VOR training and habituation, assess neck once referral is received     Janene Harvey, PT, DPT 05/25/22 9:35 AM  [facility] [address], [place], [place] [address] Phone # [phone] Fax # [phone]

## 2022-05-26 ENCOUNTER — Other Ambulatory Visit: Payer: Self-pay

## 2022-05-26 ENCOUNTER — Ambulatory Visit: Payer: 59 | Attending: Internal Medicine | Admitting: Physical Therapy

## 2022-05-26 ENCOUNTER — Encounter (HOSPITAL_COMMUNITY): Payer: Self-pay

## 2022-05-26 ENCOUNTER — Emergency Department (HOSPITAL_COMMUNITY): Payer: 59

## 2022-05-26 ENCOUNTER — Emergency Department (HOSPITAL_COMMUNITY)
Admission: EM | Admit: 2022-05-26 | Discharge: 2022-05-26 | Disposition: A | Discharge status: Home / Self Care | Payer: 59 | Attending: Emergency Medicine | Admitting: Emergency Medicine

## 2022-05-26 DIAGNOSIS — R1084 Generalized abdominal pain: Secondary | ICD-10-CM | POA: Diagnosis not present

## 2022-05-26 DIAGNOSIS — R109 Unspecified abdominal pain: Secondary | ICD-10-CM | POA: Diagnosis not present

## 2022-05-26 DIAGNOSIS — R2681 Unsteadiness on feet: Secondary | ICD-10-CM | POA: Insufficient documentation

## 2022-05-26 DIAGNOSIS — D72829 Elevated white blood cell count, unspecified: Secondary | ICD-10-CM | POA: Diagnosis not present

## 2022-05-26 DIAGNOSIS — H8112 Benign paroxysmal vertigo, left ear: Secondary | ICD-10-CM | POA: Insufficient documentation

## 2022-05-26 DIAGNOSIS — I700 Atherosclerosis of aorta: Secondary | ICD-10-CM | POA: Insufficient documentation

## 2022-05-26 DIAGNOSIS — M5459 Other low back pain: Secondary | ICD-10-CM | POA: Insufficient documentation

## 2022-05-26 DIAGNOSIS — M542 Cervicalgia: Secondary | ICD-10-CM | POA: Insufficient documentation

## 2022-05-26 DIAGNOSIS — Z7951 Long term (current) use of inhaled steroids: Secondary | ICD-10-CM | POA: Insufficient documentation

## 2022-05-26 DIAGNOSIS — R293 Abnormal posture: Secondary | ICD-10-CM | POA: Insufficient documentation

## 2022-05-26 DIAGNOSIS — R42 Dizziness and giddiness: Secondary | ICD-10-CM | POA: Insufficient documentation

## 2022-05-26 DIAGNOSIS — J45909 Unspecified asthma, uncomplicated: Secondary | ICD-10-CM | POA: Diagnosis not present

## 2022-05-26 DIAGNOSIS — K5900 Constipation, unspecified: Secondary | ICD-10-CM

## 2022-05-26 DIAGNOSIS — N83202 Unspecified ovarian cyst, left side: Secondary | ICD-10-CM | POA: Diagnosis not present

## 2022-05-26 LAB — CBC WITH DIFFERENTIAL/PLATELET
Abs Immature Granulocytes: 0.07 10*3/uL (ref 0.00–0.07)
Basophils Absolute: 0.1 10*3/uL (ref 0.0–0.1)
Basophils Relative: 1 %
Eosinophils Absolute: 0.1 10*3/uL (ref 0.0–0.5)
Eosinophils Relative: 1 %
HCT: 44.3 % (ref 36.0–46.0)
Hemoglobin: 14.7 g/dL (ref 12.0–15.0)
Immature Granulocytes: 1 %
Lymphocytes Relative: 16 %
Lymphs Abs: 2 10*3/uL (ref 0.7–4.0)
MCH: 29.8 pg (ref 26.0–34.0)
MCHC: 33.2 g/dL (ref 30.0–36.0)
MCV: 89.7 fL (ref 80.0–100.0)
Monocytes Absolute: 0.7 10*3/uL (ref 0.1–1.0)
Monocytes Relative: 6 %
Neutro Abs: 9.8 10*3/uL — ABNORMAL HIGH (ref 1.7–7.7)
Neutrophils Relative %: 75 %
Platelets: 315 10*3/uL (ref 150–400)
RBC: 4.94 MIL/uL (ref 3.87–5.11)
RDW: 13.4 % (ref 11.5–15.5)
WBC: 12.9 10*3/uL — ABNORMAL HIGH (ref 4.0–10.5)
nRBC: 0 % (ref 0.0–0.2)

## 2022-05-26 LAB — COMPREHENSIVE METABOLIC PANEL
ALT: 33 U/L (ref 0–44)
AST: 23 U/L (ref 15–41)
Albumin: 4.2 g/dL (ref 3.5–5.0)
Alkaline Phosphatase: 49 U/L (ref 38–126)
Anion gap: 11 (ref 5–15)
BUN: 20 mg/dL (ref 6–20)
CO2: 28 mmol/L (ref 22–32)
Calcium: 9.4 mg/dL (ref 8.9–10.3)
Chloride: 98 mmol/L (ref 98–111)
Creatinine, Ser: 1.1 mg/dL — ABNORMAL HIGH (ref 0.44–1.00)
GFR, Estimated: >60 mL/min (ref >60)
Glucose, Bld: 112 mg/dL — ABNORMAL HIGH (ref 70–99)
Potassium: 3.9 mmol/L (ref 3.5–5.1)
Sodium: 137 mmol/L (ref 135–145)
Total Bilirubin: 0.5 mg/dL (ref 0.3–1.2)
Total Protein: 7.1 g/dL (ref 6.5–8.1)

## 2022-05-26 LAB — URINALYSIS, ROUTINE W REFLEX MICROSCOPIC
Bilirubin Urine: NEGATIVE
Glucose, UA: NEGATIVE mg/dL
Ketones, ur: NEGATIVE mg/dL
Nitrite: NEGATIVE
Protein, ur: NEGATIVE mg/dL
Specific Gravity, Urine: 1.015 (ref 1.005–1.030)
pH: 7 (ref 5.0–8.0)

## 2022-05-26 LAB — I-STAT BETA HCG BLOOD, ED (MC, WL, AP ONLY): I-stat hCG, quantitative: <5 m[IU]/mL (ref <5)

## 2022-05-26 MED ORDER — DICYCLOMINE HCL 20 MG PO TABS: 20.0000 mg | ORAL_TABLET | Freq: Two times a day (BID) | ORAL | 0 refills | Status: DC | PRN

## 2022-05-26 MED ORDER — KETOROLAC TROMETHAMINE 30 MG/ML IJ SOLN
30.0000 mg | Freq: Once | INTRAMUSCULAR | Status: AC
  Administered 2022-05-26: 30 mg via INTRAVENOUS
  Filled 2022-05-26: qty 1

## 2022-05-26 MED ORDER — DICYCLOMINE HCL 10 MG PO CAPS
10.0000 mg | ORAL_CAPSULE | Freq: Once | ORAL | Status: AC
  Administered 2022-05-26: 10 mg via ORAL
  Filled 2022-05-26: qty 1

## 2022-05-26 MED ORDER — DOCUSATE SODIUM 100 MG PO CAPS: 100.0000 mg | ORAL_CAPSULE | Freq: Two times a day (BID) | ORAL | 0 refills | Status: AC

## 2022-05-26 MED ORDER — FLEET ENEMA 7-19 GM/118ML RE ENEM: 1.0000 | ENEMA | Freq: Every day | RECTAL | 0 refills | Status: DC | PRN

## 2022-05-26 MED ORDER — POLYETHYLENE GLYCOL 3350 17 G PO PACK: 17.0000 g | PACK | Freq: Every day | ORAL | 0 refills | Status: DC

## 2022-05-26 MED ORDER — SODIUM CHLORIDE 0.9 % IV BOLUS
1000.0000 mL | Freq: Once | INTRAVENOUS | Status: AC
  Administered 2022-05-26: 1000 mL via INTRAVENOUS

## 2022-05-26 NOTE — ED Provider Notes (Signed)
[facility] EMERGENCY DEPARTMENT Provider Note   CSN: [id number] Arrival date & time: 05/26/22  0840     History  Chief Complaint  Patient presents with   Flank Pain    Anne Jefferson is a 46 y.o. female.  Pt is a 46 yo female with a pmhx significant for anxiety, asthma, and arthritis.  Pt said she's had right flank pain for a week.  She has been on abx for a possible uti and thought the pain was getting better, but pain is worsening.  Last night, pain was really bad.  She has taken her normal am pain meds and it has helped.  She has no hx kidney stones, but has been septic from a UTI in the past.  She is worried this is happening again.       Home Medications Prior to Admission medications   Medication Sig Start Date End Date Taking? Authorizing Provider  dicyclomine (BENTYL) 20 MG tablet Take 1 tablet (20 mg total) by mouth 2 (two) times daily as needed (abdominal spasms). 05/26/22  Yes Isla Pence, MD  docusate sodium (COLACE) 100 MG capsule Take 1 capsule (100 mg total) by mouth every 12 (twelve) hours. 05/26/22  Yes Isla Pence, MD  polyethylene glycol (MIRALAX) 17 g packet Take 17 g by mouth daily. 05/26/22  Yes Isla Pence, MD  sodium phosphate (FLEET) 7-19 GM/118ML ENEM Place 133 mLs (1 enema total) rectally daily as needed for severe constipation. 05/26/22  Yes Isla Pence, MD  albuterol (PROVENTIL HFA) 108 (90 Base) MCG/ACT inhaler Inhale 2 puffs into the lungs every 4 (four) hours as needed for wheezing or shortness of breath. 05/16/22   Dara Hoyer, FNP  albuterol (PROVENTIL) (2.5 MG/3ML) 0.083% nebulizer solution Use 1 unit dose via the nebulizer every 6 hours as needed for cough, wheeze, tightness in chest, or shortness of breath. 05/27/21   Althea Charon, FNP  BREZTRI AEROSPHERE 160-9-4.8 MCG/ACT AERO Inhale 2 puffs into the lungs in the morning and at bedtime. 05/16/22   Dara Hoyer, FNP  celecoxib (CELEBREX) 100 MG capsule  Take 100 mg by mouth 2 (two) times daily.    [provider]  EPINEPHrine (AUVI-Q) 0.3 mg/0.3 mL IJ SOAJ injection Inject 0.3 mg into the muscle as needed for anaphylaxis. 07/29/20   Garnet Sierras, DO  fexofenadine (ALLEGRA) 180 MG tablet Take 180 mg by mouth 2 (two) times daily.    [provider]  folic acid (FOLVITE) 1 MG tablet Take 1 mg by mouth daily. *patient unsure of dosage    [provider]  gabapentin (NEURONTIN) 300 MG capsule Take 300 mg by mouth 3 (three) times daily.    [provider]  hydroxychloroquine (PLAQUENIL) 200 MG tablet Take 200 mg by mouth 2 (two) times daily.    [provider]  meclizine (ANTIVERT) 25 MG tablet Take 1 tablet (25 mg total) by mouth 3 (three) times daily as needed for dizziness. Patient not taking: Reported on 05/12/2022 02/03/22   Elodia Florence., MD  methotrexate (RHEUMATREX) 2.5 MG tablet Take 10 mg by mouth once a week. Caution:Chemotherapy. Protect from light.    [provider]  mometasone (NASONEX) 50 MCG/ACT nasal spray Place 1 spray into the nose daily. Patient taking differently: Place 1 spray into the nose 2 (two) times daily. 07/29/20   Garnet Sierras, DO  omeprazole (PRILOSEC OTC) 20 MG tablet Take 1 tablet (20 mg total) by mouth daily. 05/27/21  Althea Charon, FNP  predniSONE (DELTASONE) 20 MG tablet Taper as prescribed by rheumatology 02/04/22   Elodia Florence., MD  traMADol Veatrice Bourbon) 50 MG tablet Take 100 mg by mouth 1 day or 1 dose. 03/05/21   [provider]  UNABLE TO FIND 2 (two) times a week. Med Name: ALLERGY SHOTS    [provider]  VITAMIN D PO Take by mouth daily.    [provider]      Allergies    Promethazine, Theophyllines, Mite (d. farinae), and Molds & smuts    Review of Systems   Review of Systems  Gastrointestinal:  Positive for nausea.  Genitourinary:  Positive for flank pain.  All other systems reviewed and are  negative.   Physical Exam Updated Vital Signs BP 139/77   Pulse 92   Temp 98 F (36.7 C)   Resp 18   Ht '5\' 3"'$  (1.6 m)   Wt 79.4 kg   SpO2 98%   BMI 31.00 kg/m  Physical Exam Vitals and nursing note reviewed.  Constitutional:      General: She is in acute distress.  HENT:     Head: Normocephalic and atraumatic.     Right Ear: External ear normal.     Left Ear: External ear normal.     Nose: Nose normal.     Mouth/Throat:     Mouth: Mucous membranes are moist.     Pharynx: Oropharynx is clear.  Eyes:     Extraocular Movements: Extraocular movements intact.     Conjunctiva/sclera: Conjunctivae normal.     Pupils: Pupils are equal, round, and reactive to light.  Cardiovascular:     Rate and Rhythm: Normal rate and regular rhythm.     Pulses: Normal pulses.     Heart sounds: Normal heart sounds.  Pulmonary:     Effort: Pulmonary effort is normal.     Breath sounds: Normal breath sounds.  Abdominal:     General: Abdomen is flat. Bowel sounds are normal.     Palpations: Abdomen is soft.  Musculoskeletal:        General: Normal range of motion.     Cervical back: Normal range of motion and neck supple.  Skin:    General: Skin is warm.     Capillary Refill: Capillary refill takes less than 2 seconds.  Neurological:     General: No focal deficit present.     Mental Status: She is alert and oriented to person, place, and time.  Psychiatric:        Mood and Affect: Mood normal.        Behavior: Behavior normal.     ED Results / Procedures / Treatments   Labs (all labs ordered are listed, but only abnormal results are displayed) Labs Reviewed  URINALYSIS, ROUTINE W REFLEX MICROSCOPIC - Abnormal; Notable for the following components:      Result Value   APPearance HAZY (*)    Hgb urine dipstick MODERATE (*)    Leukocytes,Ua SMALL (*)    Bacteria, UA RARE (*)    All other components within normal limits  COMPREHENSIVE METABOLIC PANEL - Abnormal; Notable for the  following components:   Glucose, Bld 112 (*)    Creatinine, Ser 1.10 (*)    All other components within normal limits  CBC WITH DIFFERENTIAL/PLATELET - Abnormal; Notable for the following components:   WBC 12.9 (*)    Neutro Abs 9.8 (*)    All other components within normal  limits  URINE CULTURE  I-STAT BETA HCG BLOOD, ED ([facility], [facility], [facility] ONLY)    EKG None  Radiology CT Renal Stone Study  Result Date: 05/26/2022 CLINICAL DATA:  Two weeks of right flank pain. EXAM: CT ABDOMEN AND PELVIS WITHOUT CONTRAST TECHNIQUE: Multidetector CT imaging of the abdomen and pelvis was performed following the standard protocol without IV contrast. RADIATION DOSE REDUCTION: This exam was performed according to the departmental dose-optimization program which includes automated exposure control, adjustment of the mA and/or kV according to patient size and/or use of iterative reconstruction technique. COMPARISON:  None Available. FINDINGS: Lower chest: No acute abnormality. Hepatobiliary: Unremarkable noncontrast enhanced appearance of the hepatic parenchyma. Gallbladder is not visualized and may be surgically absent or decompressed. No biliary ductal dilation. Pancreas: Pancreatic ductal dilation or evidence of acute inflammation. Spleen: No splenomegaly. Adrenals/Urinary Tract: Bilateral adrenal glands appear normal. No hydronephrosis. No renal, ureteral or bladder calculi identified. Urinary bladder is unremarkable for degree of distension. Stomach/Bowel: Stomach is unremarkable for degree of distension. No pathologic dilation of small or large bowel. The appendix and terminal ileum appear normal. Large volume of formed stool throughout the colon. Vascular/Lymphatic: Aortic atherosclerosis. No pathologically enlarged abdominal or pelvic lymph nodes. Reproductive: Hypodense 2.8 cm left ovarian cyst, considered benign/physiologic and requiring no imaging follow-up. Intrauterine device. Other: No significant abdominopelvic  free fluid. Musculoskeletal: No acute osseous abnormality. IMPRESSION: 1. No acute abnormality in the abdomen or pelvis. Specifically, no evidence of urolithiasis or obstructive uropathy. 2. Large volume of formed stool throughout the colon suggestive of constipation. 3.  Aortic Atherosclerosis (ICD10-I70.0). Electronically Signed   By: Dahlia Bailiff M.D.   On: 05/26/2022 11:18    Procedures Procedures    Medications Ordered in ED Medications  dicyclomine (BENTYL) capsule 10 mg (has no administration in time range)  sodium chloride 0.9 % bolus 1,000 mL (1,000 mLs Intravenous New Bag/Given 05/26/22 1025)  ketorolac (TORADOL) 30 MG/ML injection 30 mg (30 mg Intravenous Given 05/26/22 1026)    ED Course/ Medical Decision Making/ A&P                           Medical Decision Making Amount and/or Complexity of Data Reviewed Labs: ordered.  Risk Prescription drug management.   This patient presents to the ED for concern of flank pain, this involves an extensive number of treatment options, and is a complaint that carries with it a high risk of complications and morbidity.  The differential diagnosis includes uti, pyelo, kidney stone   Co morbidities that complicate the patient evaluation  anxiety, asthma, and arthritis   Additional history obtained:  Additional history obtained from epic chart review    Lab Tests:  I Ordered, and personally interpreted labs.  The pertinent results include:  ua + mod hgb, cbc with wbc mildly elevated at 12.9, cmp nl; preg neg   Imaging Studies ordered:  I ordered imaging studies including ct renal  I independently visualized and interpreted imaging which showed  IMPRESSION:  1. No acute abnormality in the abdomen or pelvis. Specifically, no  evidence of urolithiasis or obstructive uropathy.  2. Large volume of formed stool throughout the colon suggestive of  constipation.  3.  Aortic Atherosclerosis (ICD10-I70.0).   I agree with the  radiologist interpretation   Cardiac Monitoring:  The patient was maintained on a cardiac monitor.  I personally viewed and interpreted the cardiac monitored which showed an underlying rhythm of: nsr   Medicines  ordered and prescription drug management:  I ordered medication including ivfs and toradol  for dehydration and pain  Reevaluation of the patient after these medicines showed that the patient improved I have reviewed the patients home medicines and have made adjustments as needed   Test Considered:  ct   Critical Interventions:  ct   Problem List / ED Course:  Abd pain:  CT with constipation.  Pt d/c with a rx for fleet enema (she'd rather do this at home), miralax, colace, and bentyl.  She is encouraged to eat a high fiber diet with lots of water.  She is stable for d/c.  Return if worse.  F/u with pcp.   Reevaluation:  After the interventions noted above, I reevaluated the patient and found that they have :improved   Social Determinants of Health:  Lives at home   Dispostion:  After consideration of the diagnostic results and the patients response to treatment, I feel that the patent would benefit from discharge with outpatient f/u.          Final Clinical Impression(s) / ED Diagnoses Final diagnoses:  Constipation, unspecified constipation type  Generalized abdominal pain    Rx / DC Orders ED Discharge Orders          Ordered    sodium phosphate (FLEET) 7-19 GM/118ML ENEM  Daily PRN        05/26/22 1137    polyethylene glycol (MIRALAX) 17 g packet  Daily        05/26/22 1137    docusate sodium (COLACE) 100 MG capsule  Every 12 hours        05/26/22 1137    dicyclomine (BENTYL) 20 MG tablet  2 times daily PRN        05/26/22 1138              Isla Pence, MD 05/26/22 1140

## 2022-05-26 NOTE — ED Triage Notes (Signed)
Pt arrived POV from home c/o right flank pain x2 weeks. Pt states it was dull at first now she cannot take the pain. Pt states she was started on Bactrim 2 days ago but it is not helping. Pt states last time this happened she got septic.

## 2022-05-26 NOTE — ED Provider Triage Note (Signed)
Emergency Medicine Provider Triage Evaluation Note  Anne Jefferson , a 46 y.o. female  was evaluated in triage.  Pt complains of right flank pain for the last few days.  She is on Bactrim for UTI.  She feels nauseated but is not vomiting.  Feels like "I am getting go septic".  She is also chronically on steroids for RA.Marland Kitchen  Review of Systems  Per HPI  Physical Exam  BP (!) 176/109 (BP Location: Right Arm)   Pulse 95   Temp 98 F (36.7 C)   Resp 17   Ht '5\' 3"'$  (1.6 m)   Wt 79.4 kg   SpO2 98%   BMI 31.00 kg/m  Gen:   Awake, anxious. Rapid speech  Resp:  Normal effort  MSK:   Moves extremities without difficulty  Other:  +RCVAT  Medical Decision Making  Medically screening exam initiated at 9:00 AM.  Appropriate orders placed.  Anne Jefferson was informed that the remainder of the evaluation will be completed by another provider, this initial triage assessment does not replace that evaluation, and the importance of remaining in the ED until their evaluation is complete.  No sirs. Nephrolithiasis vs. Pyelo. Culture given on abx currently    Sherrill Raring, [place] 05/26/22 0901

## 2022-05-27 LAB — URINE CULTURE: Culture: NO GROWTH

## 2022-05-30 ENCOUNTER — Other Ambulatory Visit: Payer: Self-pay

## 2022-05-30 ENCOUNTER — Emergency Department (HOSPITAL_COMMUNITY)
Admission: EM | Admit: 2022-05-30 | Discharge: 2022-05-30 | Discharge status: Against Medical Advice | Payer: 59 | Attending: Student | Admitting: Student

## 2022-05-30 ENCOUNTER — Encounter (HOSPITAL_COMMUNITY): Payer: Self-pay

## 2022-05-30 DIAGNOSIS — R110 Nausea: Secondary | ICD-10-CM | POA: Insufficient documentation

## 2022-05-30 DIAGNOSIS — Z5321 Procedure and treatment not carried out due to patient leaving prior to being seen by health care provider: Secondary | ICD-10-CM | POA: Diagnosis not present

## 2022-05-30 DIAGNOSIS — R1030 Lower abdominal pain, unspecified: Secondary | ICD-10-CM | POA: Diagnosis not present

## 2022-05-30 DIAGNOSIS — R197 Diarrhea, unspecified: Secondary | ICD-10-CM | POA: Diagnosis not present

## 2022-05-30 DIAGNOSIS — M545 Low back pain, unspecified: Secondary | ICD-10-CM | POA: Diagnosis not present

## 2022-05-30 LAB — CBC WITH DIFFERENTIAL/PLATELET
Abs Immature Granulocytes: 0.02 10*3/uL (ref 0.00–0.07)
Basophils Absolute: 0.1 10*3/uL (ref 0.0–0.1)
Basophils Relative: 1 %
Eosinophils Absolute: 0.2 10*3/uL (ref 0.0–0.5)
Eosinophils Relative: 2 %
HCT: 44.6 % (ref 36.0–46.0)
Hemoglobin: 14.3 g/dL (ref 12.0–15.0)
Immature Granulocytes: 0 %
Lymphocytes Relative: 34 %
Lymphs Abs: 3.2 10*3/uL (ref 0.7–4.0)
MCH: 29.1 pg (ref 26.0–34.0)
MCHC: 32.1 g/dL (ref 30.0–36.0)
MCV: 90.7 fL (ref 80.0–100.0)
Monocytes Absolute: 0.9 10*3/uL (ref 0.1–1.0)
Monocytes Relative: 10 %
Neutro Abs: 4.9 10*3/uL (ref 1.7–7.7)
Neutrophils Relative %: 53 %
Platelets: 336 10*3/uL (ref 150–400)
RBC: 4.92 MIL/uL (ref 3.87–5.11)
RDW: 13.5 % (ref 11.5–15.5)
WBC: 9.3 10*3/uL (ref 4.0–10.5)
nRBC: 0 % (ref 0.0–0.2)

## 2022-05-30 LAB — COMPREHENSIVE METABOLIC PANEL
ALT: 36 U/L (ref 0–44)
AST: 33 U/L (ref 15–41)
Albumin: 4.3 g/dL (ref 3.5–5.0)
Alkaline Phosphatase: 47 U/L (ref 38–126)
Anion gap: 15 (ref 5–15)
BUN: 9 mg/dL (ref 6–20)
CO2: 24 mmol/L (ref 22–32)
Calcium: 9.4 mg/dL (ref 8.9–10.3)
Chloride: 98 mmol/L (ref 98–111)
Creatinine, Ser: 0.89 mg/dL (ref 0.44–1.00)
GFR, Estimated: >60 mL/min (ref >60)
Glucose, Bld: 114 mg/dL — ABNORMAL HIGH (ref 70–99)
Potassium: 3.9 mmol/L (ref 3.5–5.1)
Sodium: 137 mmol/L (ref 135–145)
Total Bilirubin: 0.9 mg/dL (ref 0.3–1.2)
Total Protein: 7.2 g/dL (ref 6.5–8.1)

## 2022-05-30 LAB — I-STAT BETA HCG BLOOD, ED (MC, WL, AP ONLY): I-stat hCG, quantitative: <5 m[IU]/mL (ref <5)

## 2022-05-30 LAB — LIPASE, BLOOD: Lipase: 30 U/L (ref 11–51)

## 2022-05-30 MED ORDER — ONDANSETRON 4 MG PO TBDP
8.0000 mg | ORAL_TABLET | Freq: Once | ORAL | Status: AC
  Administered 2022-05-30: 8 mg via ORAL
  Filled 2022-05-30: qty 2

## 2022-05-30 NOTE — ED Notes (Signed)
Pt is up to sort stating that she is leaving, pt stating she is feeling better and has had multiple bowel movements. Discussed with patient risk and she is free to return at any point.

## 2022-05-30 NOTE — ED Triage Notes (Signed)
Reports was here Friday and diagnosed with constipation and impaction.  Patient reports has been doing laxatives and enemas.  Reports not solid stool just diarrhea.

## 2022-05-30 NOTE — ED Provider Triage Note (Signed)
Emergency Medicine Provider Triage Evaluation Note  Anne Jefferson , a 46 y.o. female  was evaluated in triage.  Pt complains of "my colon hurts".  Patient was seen last week, she has been trying MiraLAX, enemas but is not having a full bowel movement only having watery diarrhea.  History of 2 previous C-sections no other abdominal surgeries.  Endorses nausea no emesis..  Review of Systems  Per HPI  Physical Exam  BP 129/85 (BP Location: Right Arm)   Pulse 90   Temp 98.2 F (36.8 C) (Oral)   Resp 18   Ht '5\' 3"'$  (1.6 m)   Wt 79.4 kg   SpO2 97%   BMI 31.00 kg/m  Gen:   Awake, no distress   Resp:  Normal effort  MSK:   Moves extremities without difficulty  Other:  Mild right CVA tenderness.  Generalized abdominal tenderness  Medical Decision Making  Medically screening exam initiated at 12:26 PM.  Appropriate orders placed.  Anne Jefferson was informed that the remainder of the evaluation will be completed by another provider, this initial triage assessment does not replace that evaluation, and the importance of remaining in the ED until their evaluation is complete.     Sherrill Raring, PA-C 05/30/22 1227

## 2022-05-31 DIAGNOSIS — R42 Dizziness and giddiness: Secondary | ICD-10-CM | POA: Diagnosis not present

## 2022-05-31 DIAGNOSIS — M2550 Pain in unspecified joint: Secondary | ICD-10-CM | POA: Diagnosis not present

## 2022-05-31 DIAGNOSIS — Z79899 Other long term (current) drug therapy: Secondary | ICD-10-CM | POA: Diagnosis not present

## 2022-05-31 DIAGNOSIS — R768 Other specified abnormal immunological findings in serum: Secondary | ICD-10-CM | POA: Diagnosis not present

## 2022-05-31 NOTE — Progress Notes (Incomplete)
OUTPATIENT PHYSICAL THERAPY VESTIBULAR TREATMENT     Patient Name: Anne Jefferson MRN: [id number] DOB:15-Feb-1976, 46 y.o., female Today's Date: 05/31/2022  PCP: Josetta Huddle, MD REFERRING PROVIDER: Kathalene Frames, MD          Past Medical History:  Diagnosis Date   Abnormal Pap smear    Anxiety    no meds   Asthma    mild, no issue for years no  inhaler use except in winter   Biliary colic    Blood type, Rh negative    Gallstones    H/O dysmenorrhea    Infertility, female    Nausea and vomiting    Pain    back and chest realted to gallbladder since november 2018   PONV (postoperative nausea and vomiting)    severe, needs heavy anti nausea meds   Spinal stenosis    Past Surgical History:  Procedure Laterality Date    c sections     x 2  3 births last was twins   BUNIONECTOMY Right    CHOLECYSTECTOMY N/A 11/22/2017   Procedure: [place];  Surgeon: Mickeal Skinner, MD;  Location: [facility];  Service: General;  Laterality: N/A;   LEEP  12/23/2009   WISDOM TOOTH EXTRACTION     Patient Active Problem List   Diagnosis Date Noted   Lightheadedness 02/03/2022   Anxiety 02/03/2022   Obesity (BMI 30-39.9) 02/03/2022   Abnormal TSH 02/03/2022   Cervical spondylosis with radiculopathy 02/02/2022   Rheumatoid arthritis ([place]) 02/02/2022   Adult stuttering 02/01/2022   History of nasal polyposis 11/17/2021   Acute cough 10/20/2021   Viral illness 10/20/2021   Moderate persistent asthma without complication 09/73/5329   Moderate persistent asthma with (acute) exacerbation 11/16/2020   Shooting pain 07/29/2020   Atopic dermatitis 04/01/2019   Perennial and seasonal allergic rhinitis 09/17/2018   Acute non-recurrent frontal sinusitis 09/17/2018   Infertility, female    Abnormal Pap smear    H/O dysmenorrhea    Blood type, Rh negative    METATARSALGIA 12/18/2007   BUNIONS, LEFT FOOT 12/18/2007   UNEQUAL LEG  LENGTH 12/18/2007   SPRAIN&STRAIN OF UNSPECIFIED SITE OF KNEE&LEG 11/13/2007    ONSET DATE: 02/01/22  REFERRING DIAG: R42 (ICD-10-CM) - Dizziness and giddiness  THERAPY DIAG:  No diagnosis found.  Rationale for Evaluation and Treatment Rehabilitation  SUBJECTIVE:   SUBJECTIVE STATEMENT: Patient reports that she was put on a new medication for her RA however unable to take Celebrex at the same time, so her pain levels have been worse. Has long standing problems with her spine which is affecting the R arm. Reports N/T down the R arm which has gotten worse recently. Went rock climbing recently. Not feeling well today. Dizziness has been getting better but still there.   Pt accompanied by: self  PERTINENT HISTORY: anxiety, asthma, R bunionectomy; per patient-  has "presumed RA"   PAIN:  Are you having pain? Yes: NPRS scale: 3/10 Pain location: B hands joints   PRECAUTIONS: Fall  PATIENT GOALS improve dizziness   OBJECTIVE:    TODAY'S TREATMENT: 05/31/22   CERVICAL ROM:   Active ROM A/PROM (deg) eval  Flexion   Extension   Right lateral flexion   Left lateral flexion   Right rotation   Left rotation    (Blank rows = not tested)   UPPER EXTREMITY MMT:  MMT Right eval Left eval  Shoulder flexion    Shoulder extension    Shoulder abduction  Shoulder adduction    Shoulder extension    Shoulder internal rotation    Shoulder external rotation    Middle trapezius    Lower trapezius    Elbow flexion    Elbow extension    Wrist flexion    Wrist extension    Wrist ulnar deviation    Wrist radial deviation    Wrist pronation    Wrist supination    Grip strength     (Blank rows = not tested)    Activity Comments  Palpation                        HOME EXERCISE PROGRAM Last updated: 04/10/22 Access Code: [email or web address] URL: [email or web address] Date: 04/10/2022 Prepared by: [facility]  Exercises - Brandt-Daroff Vestibular Exercise  - 1 x daily - 5 x weekly - 2 sets - 3-5 reps - Standing with Head Rotation  - 1 x daily - 5 x weekly - 2 sets - 30 sec hold - Standing with Head Nod  - 1 x daily - 5 x weekly - 2 sets - 30 sec hold - Standing Gaze Stabilization with Head Rotation  - 1 x daily - 5 x weekly - 2 sets - 30 sec hold - Standing Gaze Stabilization with Head Nod  - 1 x daily - 5 x weekly - 2 sets - 30 sec hold - Romberg Stance on Foam Pad  - 1 x daily - 5 x weekly - 2 sets - 30 sec hold - Standing Balance with Eyes Closed on Foam  - 1 x daily - 5 x weekly - 2 sets - 30 sec hold     Below measures were taken at time of initial evaluation unless otherwise specified:    At rest:  98% SpO2, 74 bpm 130/83 mmHg  DIAGNOSTIC FINDINGS: 02/02/22 brain MRI: Normal brain MRI. No acute intracranial infarct or other abnormality.  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   Ocular ROM:  decreased inferior excursion of L eye    Spontaneous Nystagmus: absent   Gaze-Induced Nystagmus: absent   Smooth Pursuits:  L eye elevated with horizontal pursuit   Saccades: intact; slightly slow and c/o nausea/dizziness    Convergence/Divergence: c/o diplopia at ~1.5 ft away    VESTIBULAR - OCULAR REFLEX:    Slow VOR: Comment: slow and inability to focus L eye; c/o dizziness in vertical direction   VOR Cancellation: Normal; c/o dizziness   Head-Impulse Test: HIT Right: positive HIT Left:  slightly positive       POSITIONAL TESTING:  Right Roll Test: negative; c/o mild dizziness Left Roll Test: negative; c/o mild dizziness Right Dix-Hallpike: negative Left Dix-Hallpike: L upbeating torsional nystagmus; unable to tolerate full duration of test d/t nausea   VESTIBULAR TREATMENT:   PATIENT EDUCATION: Education details: prognosis, POC, HEP, provided edu and handout on vestibular hypofunction and BPPV Person educated: Patient Education method: Explanation, Corporate treasurer cues, Verbal cues, and Handouts Education comprehension: verbalized  understanding   GOALS: Goals reviewed with patient? Yes  SHORT TERM GOALS: Target date: 04/05/2022  Patient to be independent with initial HEP. Baseline: HEP initiated Goal status: MET    LONG TERM GOALS: Target date: 07/07/2022  Patient to be independent with advanced HEP. Baseline: Not yet initiated; met for current 04/28/22 Goal status: IN PROGRESS 04/28/22  Patient to report 0/10 dizziness with standing vertical and horizontal VOR for 30 seconds. Baseline: Unable; 4/10 dizziness 04/10/22, 4-6/10 dizziness 04/28/22 Goal status: IN PROGRESS 04/28/22  Patient will report 0/10 dizziness with bed mobility.  Baseline: Symptomatic; 6-7/10 dizziness 04/28/22 Goal status: IN PROGRESS 04/28/22  Patient to demonstrate mild-moderate sway with M-CTSIB condition with eyes closed/foam surface in order to improve safety in environments with uneven surfaces and dim lighting. Baseline: NT; unable to tolerate 30 sec 04/10/22; NT d/t nausea 04/28/22 Goal status: IN PROGRESS 04/28/22  Patient to score at least 20/24 on DGI in order to decrease risk of falls. Baseline: NT Goal status: MET 04/03/22  Patient will ambulate over outdoor surfaces with LRAD while performing head turns to scan environment with good stability in order to indicate safe community mobility. Baseline: Unable; unable to tolerated d/t nausea 04/28/22 Goal status: IN PROGRESS  04/28/22   ASSESSMENT:  CLINICAL IMPRESSION: Patient arrived to session with report of increased pain levels, particularly in the neck with radiation to the R UE with N/T. Reports that this is a long-standing issue which has recently worsened. Reports that dizziness has been getting better but is still there.Patient reports 4-6/10 dizziness with VOR exercise today and 6-7/10 dizziness with simulation of bed mobility. Demonstrates up to moderate sway with multisensory balance testing and entirety of test was not conducted d/t nausea. Gait with head turns was not assessed d/t severity of symptoms today, and session was cut short as a result. Patient is making slow but steady progress towards goals. Would benefit from additional skilled PT services 1x/week for 8 weeks.    OBJECTIVE IMPAIRMENTS Abnormal gait, decreased activity tolerance, decreased balance, dizziness, and pain.   ACTIVITY LIMITATIONS carrying, lifting, bending, sitting, standing, squatting, sleeping, stairs, transfers, bed mobility, bathing, dressing, and caring for others  PARTICIPATION LIMITATIONS: meal prep, cleaning, laundry, driving, shopping, community activity, occupation, yard work, and church  PERSONAL FACTORS Age, Past/current experiences, Profession, Time since onset of injury/illness/exacerbation, and 3+ comorbidities: anxiety, asthma, R bunionectomy  are also affecting patient's functional outcome.   REHAB POTENTIAL: Good  CLINICAL DECISION MAKING: Evolving/moderate complexity  EVALUATION COMPLEXITY: Moderate   PLAN: PT FREQUENCY: 1x/week  PT DURATION: 8 weeks  PLANNED INTERVENTIONS: Therapeutic exercises, Therapeutic activity, Neuromuscular re-education, Balance training, Gait training, Patient/Family education, Self Care, Joint mobilization, Stair training, Vestibular training, Canalith repositioning, Dry Needling, Cryotherapy, Moist heat, Taping, Manual therapy, and Re-evaluation  PLAN FOR NEXT SESSION:  progress VOR training and habituation, assess neck once referral is received     Janene Harvey, PT, DPT 05/31/22 12:39 PM  [facility] [address], [place], [place] [address] Phone # [phone] Fax # [phone]

## 2022-06-01 ENCOUNTER — Emergency Department (HOSPITAL_COMMUNITY): Payer: 59

## 2022-06-01 ENCOUNTER — Other Ambulatory Visit: Payer: Self-pay

## 2022-06-01 ENCOUNTER — Ambulatory Visit: Payer: 59 | Admitting: Podiatry

## 2022-06-01 ENCOUNTER — Emergency Department (HOSPITAL_COMMUNITY)
Admission: EM | Admit: 2022-06-01 | Discharge: 2022-06-01 | Disposition: A | Discharge status: Home / Self Care | Payer: 59 | Attending: Emergency Medicine | Admitting: Emergency Medicine

## 2022-06-01 ENCOUNTER — Encounter (HOSPITAL_COMMUNITY): Payer: Self-pay | Admitting: *Deleted

## 2022-06-01 DIAGNOSIS — J45909 Unspecified asthma, uncomplicated: Secondary | ICD-10-CM | POA: Diagnosis not present

## 2022-06-01 DIAGNOSIS — Z7952 Long term (current) use of systemic steroids: Secondary | ICD-10-CM | POA: Insufficient documentation

## 2022-06-01 DIAGNOSIS — R197 Diarrhea, unspecified: Secondary | ICD-10-CM | POA: Insufficient documentation

## 2022-06-01 DIAGNOSIS — M545 Low back pain, unspecified: Secondary | ICD-10-CM

## 2022-06-01 DIAGNOSIS — K769 Liver disease, unspecified: Secondary | ICD-10-CM | POA: Diagnosis not present

## 2022-06-01 DIAGNOSIS — M5126 Other intervertebral disc displacement, lumbar region: Secondary | ICD-10-CM | POA: Diagnosis not present

## 2022-06-01 LAB — URINALYSIS, ROUTINE W REFLEX MICROSCOPIC
Bilirubin Urine: NEGATIVE
Glucose, UA: NEGATIVE mg/dL
Hgb urine dipstick: NEGATIVE
Ketones, ur: NEGATIVE mg/dL
Leukocytes,Ua: NEGATIVE
Nitrite: NEGATIVE
Protein, ur: NEGATIVE mg/dL
Specific Gravity, Urine: 1.021 (ref 1.005–1.030)
pH: 8 (ref 5.0–8.0)

## 2022-06-01 LAB — CBC WITH DIFFERENTIAL/PLATELET
Abs Immature Granulocytes: 0.02 10*3/uL (ref 0.00–0.07)
Basophils Absolute: 0.1 10*3/uL (ref 0.0–0.1)
Basophils Relative: 1 %
Eosinophils Absolute: 0.2 10*3/uL (ref 0.0–0.5)
Eosinophils Relative: 2 %
HCT: 43 % (ref 36.0–46.0)
Hemoglobin: 13.9 g/dL (ref 12.0–15.0)
Immature Granulocytes: 0 %
Lymphocytes Relative: 23 %
Lymphs Abs: 2.1 10*3/uL (ref 0.7–4.0)
MCH: 29.9 pg (ref 26.0–34.0)
MCHC: 32.3 g/dL (ref 30.0–36.0)
MCV: 92.5 fL (ref 80.0–100.0)
Monocytes Absolute: 0.8 10*3/uL (ref 0.1–1.0)
Monocytes Relative: 9 %
Neutro Abs: 5.7 10*3/uL (ref 1.7–7.7)
Neutrophils Relative %: 65 %
Platelets: 306 10*3/uL (ref 150–400)
RBC: 4.65 MIL/uL (ref 3.87–5.11)
RDW: 13.5 % (ref 11.5–15.5)
WBC: 8.8 10*3/uL (ref 4.0–10.5)
nRBC: 0 % (ref 0.0–0.2)

## 2022-06-01 LAB — COMPREHENSIVE METABOLIC PANEL
ALT: 28 U/L (ref 0–44)
AST: 32 U/L (ref 15–41)
Albumin: 4 g/dL (ref 3.5–5.0)
Alkaline Phosphatase: 48 U/L (ref 38–126)
Anion gap: 8 (ref 5–15)
BUN: 15 mg/dL (ref 6–20)
CO2: 32 mmol/L (ref 22–32)
Calcium: 9.3 mg/dL (ref 8.9–10.3)
Chloride: 100 mmol/L (ref 98–111)
Creatinine, Ser: 0.97 mg/dL (ref 0.44–1.00)
GFR, Estimated: >60 mL/min (ref >60)
Glucose, Bld: 104 mg/dL — ABNORMAL HIGH (ref 70–99)
Potassium: 4.3 mmol/L (ref 3.5–5.1)
Sodium: 140 mmol/L (ref 135–145)
Total Bilirubin: 0.7 mg/dL (ref 0.3–1.2)
Total Protein: 6.7 g/dL (ref 6.5–8.1)

## 2022-06-01 LAB — LIPASE, BLOOD: Lipase: 37 U/L (ref 11–51)

## 2022-06-01 MED ORDER — METHOCARBAMOL 500 MG PO TABS
500.0000 mg | ORAL_TABLET | Freq: Once | ORAL | Status: AC
  Administered 2022-06-01: 500 mg via ORAL
  Filled 2022-06-01: qty 1

## 2022-06-01 MED ORDER — HYDROMORPHONE HCL 1 MG/ML IJ SOLN
1.0000 mg | Freq: Once | INTRAMUSCULAR | Status: AC
  Administered 2022-06-01: 1 mg via INTRAVENOUS
  Filled 2022-06-01: qty 1

## 2022-06-01 MED ORDER — ONDANSETRON HCL 4 MG/2ML IJ SOLN
4.0000 mg | Freq: Once | INTRAMUSCULAR | Status: AC
  Administered 2022-06-01: 4 mg via INTRAVENOUS
  Filled 2022-06-01: qty 2

## 2022-06-01 MED ORDER — PREDNISONE 50 MG PO TABS: 50.0000 mg | ORAL_TABLET | Freq: Every day | ORAL | 0 refills | Status: AC

## 2022-06-01 MED ORDER — DICYCLOMINE HCL 10 MG PO CAPS
20.0000 mg | ORAL_CAPSULE | Freq: Once | ORAL | Status: AC
  Administered 2022-06-01: 20 mg via ORAL
  Filled 2022-06-01: qty 2

## 2022-06-01 MED ORDER — MORPHINE SULFATE (PF) 4 MG/ML IV SOLN
4.0000 mg | Freq: Once | INTRAVENOUS | Status: AC
  Administered 2022-06-01: 4 mg via INTRAVENOUS
  Filled 2022-06-01: qty 1

## 2022-06-01 MED ORDER — PREDNISONE 5 MG PO TABS
50.0000 mg | ORAL_TABLET | Freq: Once | ORAL | Status: AC
  Administered 2022-06-01: 50 mg via ORAL
  Filled 2022-06-01: qty 2

## 2022-06-01 MED ORDER — IOHEXOL 350 MG/ML SOLN
75.0000 mL | Freq: Once | INTRAVENOUS | Status: AC | PRN
  Administered 2022-06-01: 75 mL via INTRAVENOUS

## 2022-06-01 MED ORDER — METHOCARBAMOL 500 MG PO TABS: 500.0000 mg | ORAL_TABLET | Freq: Two times a day (BID) | ORAL | 0 refills | Status: DC

## 2022-06-01 MED ORDER — SODIUM CHLORIDE 0.9 % IV BOLUS
1000.0000 mL | Freq: Once | INTRAVENOUS | Status: AC
  Administered 2022-06-01: 1000 mL via INTRAVENOUS

## 2022-06-01 NOTE — ED Triage Notes (Signed)
Patient c/o severe lower back pain , states she was seen here on fri. And was told she was impacted went home using  OTC medication  without relief, come into ED on Tues and waited 8 hours and left , states the pain is unbearable.

## 2022-06-01 NOTE — ED Notes (Signed)
Pt transported to CT at this time.

## 2022-06-01 NOTE — ED Provider Notes (Signed)
[facility] EMERGENCY DEPARTMENT Provider Note   CSN: [id number] Arrival date & time: 06/01/22  6440     History  Chief Complaint  Patient presents with   Back Pain    Anne Jefferson is a 46 y.o. female.  Anne Jefferson is a 46 y.o. female with a reported history of rheumatoid arthritis, chronic pain, asthma and allergies, who presents to the emergency department for severe back pain.  Patient reports she was seen for the same about 1 week ago in the ED on Friday.  She had a CT scan that showed diffuse constipation which was thought to potentially be contributing to her pain.  Her work-up was otherwise unremarkable.  She was discharged home with enema, Colace and MiraLAX.  She reports despite doing daily enemas and taking other medications she has been passing amounts of liquid brown stool but has started to have worsening low back pain.  She reports the pain is at midline and to the right of her low back and sometimes radiates into her right leg.  She denies numbness or weakness.  No loss of bowel or bladder control or saddle anesthesia.  No difficulty urinating and she denies dysuria, urinary frequency, hematuria or flank pain.  She reports that she has been able to keep down food and fluids but sometimes this makes her pain worse and then she has an episode of diarrhea.  She reports the pain has been worse in the mornings when she gets up and tends to get better throughout the day.  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  The history is provided by the patient and medical records.       Home Medications Prior to Admission medications   Medication Sig Start Date End Date Taking? Authorizing Provider  methocarbamol (ROBAXIN) 500 MG tablet Take 1 tablet (500 mg total) by mouth 2 (two) times daily. 06/01/22   Yes Jacqlyn Larsen, PA-C  predniSONE (DELTASONE) 50 MG tablet Take 1 tablet (50 mg total) by mouth daily for 5 days. 06/01/22 06/06/22 Yes Jacqlyn Larsen, PA-C  albuterol (PROVENTIL HFA) 108 (90 Base) MCG/ACT inhaler Inhale 2 puffs into the lungs every 4 (four) hours as needed for wheezing or shortness of breath. 05/16/22   Dara Hoyer, FNP  albuterol (PROVENTIL) (2.5 MG/3ML) 0.083% nebulizer solution Use 1 unit dose via the nebulizer every 6 hours as needed for cough, wheeze, tightness in chest, or shortness of breath. 05/27/21   Althea Charon, FNP  BREZTRI AEROSPHERE 160-9-4.8 MCG/ACT AERO Inhale 2 puffs into the lungs in the morning and at bedtime. 05/16/22   Dara Hoyer, FNP  celecoxib (CELEBREX) 100 MG capsule Take 100 mg by mouth 2 (two) times daily.    [provider]  dicyclomine (BENTYL) 20 MG tablet Take 1 tablet (20 mg total) by mouth 2 (two) times daily as needed (abdominal spasms). 05/26/22   Isla Pence, MD  docusate sodium (COLACE) 100 MG capsule Take 1 capsule (100 mg total) by mouth every 12 (twelve) hours. 05/26/22   Isla Pence, MD  EPINEPHrine (AUVI-Q) 0.3 mg/0.3 mL IJ SOAJ injection Inject 0.3 mg into the muscle as needed for anaphylaxis. 07/29/20   Garnet Sierras, DO  fexofenadine (ALLEGRA) 180 MG tablet Take 180 mg by mouth 2 (two) times daily.  [provider]  folic acid (FOLVITE) 1 MG tablet Take 1 mg by mouth daily. *patient unsure of dosage    [provider]  gabapentin (NEURONTIN) 300 MG capsule Take 300 mg by mouth 3 (three) times daily.    [provider]  hydroxychloroquine (PLAQUENIL) 200 MG tablet Take 200 mg by mouth 2 (two) times daily.    [provider]  meclizine (ANTIVERT) 25 MG tablet Take 1 tablet (25 mg total) by mouth 3 (three) times daily as needed for dizziness. Patient not taking: Reported on 05/12/2022 02/03/22   Elodia Florence., MD  methotrexate (RHEUMATREX) 2.5 MG tablet Take 10 mg by mouth once  a week. Caution:Chemotherapy. Protect from light.    [provider]  mometasone (NASONEX) 50 MCG/ACT nasal spray Place 1 spray into the nose daily. Patient taking differently: Place 1 spray into the nose 2 (two) times daily. 07/29/20   Garnet Sierras, DO  omeprazole (PRILOSEC OTC) 20 MG tablet Take 1 tablet (20 mg total) by mouth daily. 05/27/21   Althea Charon, FNP  polyethylene glycol (MIRALAX) 17 g packet Take 17 g by mouth daily. 05/26/22   Isla Pence, MD  predniSONE (DELTASONE) 20 MG tablet Taper as prescribed by rheumatology 02/04/22   Elodia Florence., MD  sodium phosphate (FLEET) 7-19 GM/118ML ENEM Place 133 mLs (1 enema total) rectally daily as needed for severe constipation. 05/26/22   Isla Pence, MD  traMADol (ULTRAM) 50 MG tablet Take 100 mg by mouth 1 day or 1 dose. 03/05/21   [provider]  UNABLE TO FIND 2 (two) times a week. Med Name: ALLERGY SHOTS    [provider]  VITAMIN D PO Take by mouth daily.    [provider]      Allergies    Promethazine, Theophyllines, Mite (d. farinae), and Molds & smuts    Review of Systems   Review of Systems  Constitutional:  Negative for chills and fever.  HENT: Negative.    Respiratory:  Negative for cough and shortness of breath.   Cardiovascular:  Negative for chest pain.  Gastrointestinal:  Positive for diarrhea. Negative for abdominal pain, constipation, nausea and vomiting.  Genitourinary:  Negative for dysuria, flank pain, frequency and hematuria.  Musculoskeletal:  Positive for back pain. Negative for arthralgias and myalgias.  Skin:  Negative for color change and wound.  Neurological:  Negative for weakness and numbness.  All other systems reviewed and are negative.   Physical Exam Updated Vital Signs BP 129/86   Pulse 74   Temp 98.2 F (36.8 C) (Oral)   Resp 17   Ht '5\' 3"'$  (1.6 m)   Wt 79.4 kg   SpO2 100%   BMI 31.00 kg/m  Physical Exam Vitals and nursing note  reviewed.  Constitutional:      General: She is not in acute distress.    Appearance: Normal appearance. She is well-developed. She is not ill-appearing or diaphoretic.  HENT:     Head: Normocephalic and atraumatic.  Eyes:     General:        Right eye: No discharge.        Left eye: No discharge.  Cardiovascular:     Rate and Rhythm: Normal rate and regular rhythm.     Pulses: Normal pulses.     Heart sounds: Normal heart sounds.  Pulmonary:     Effort: Pulmonary effort is normal. No respiratory distress.     Breath sounds: Normal  breath sounds. No wheezing or rales.     Comments: Respirations equal and unlabored, patient able to speak in full sentences, lungs clear to auscultation bilaterally  Abdominal:     General: Bowel sounds are normal. There is no distension.     Palpations: Abdomen is soft. There is no mass.     Tenderness: There is no abdominal tenderness. There is no guarding.     Comments: Abdomen soft, nondistended, nontender to palpation in all quadrants without guarding or peritoneal signs  Genitourinary:    Comments: Chaperone present during rectal exam. Normal rectal tone, no stool present in the rectal vault, no palpable stool ball or impaction Musculoskeletal:        General: No deformity.     Cervical back: Neck supple.  Skin:    General: Skin is warm and dry.     Capillary Refill: Capillary refill takes less than 2 seconds.  Neurological:     Mental Status: She is alert and oriented to person, place, and time.     Coordination: Coordination normal.     Comments: Speech is clear, able to follow commands CN III-XII intact Normal strength in upper and lower extremities bilaterally including dorsiflexion and plantar flexion, strong and equal grip strength Sensation normal to light and sharp touch Moves extremities without ataxia, coordination intact  Psychiatric:        Mood and Affect: Mood normal.        Behavior: Behavior normal.     ED Results /  Procedures / Treatments   Labs (all labs ordered are listed, but only abnormal results are displayed) Labs Reviewed  COMPREHENSIVE METABOLIC PANEL - Abnormal; Notable for the following components:      Result Value   Glucose, Bld 104 (*)    All other components within normal limits  URINALYSIS, ROUTINE W REFLEX MICROSCOPIC - Abnormal; Notable for the following components:   Color, Urine STRAW (*)    All other components within normal limits  CBC WITH DIFFERENTIAL/PLATELET  LIPASE, BLOOD    EKG None  Radiology CT L-SPINE NO CHARGE  Result Date: 06/01/2022 CLINICAL DATA:  Back pain EXAM: CT LUMBAR SPINE WITHOUT CONTRAST TECHNIQUE: Multidetector CT imaging of the lumbar spine was performed without intravenous contrast administration. Multiplanar CT image reconstructions were also generated. RADIATION DOSE REDUCTION: This exam was performed according to the departmental dose-optimization program which includes automated exposure control, adjustment of the mA and/or kV according to patient size and/or use of iterative reconstruction technique. COMPARISON:  05/26/2022 FINDINGS: Segmentation: The lowest lumbar type non-rib-bearing vertebra is labeled as L5. Alignment: 3 mm degenerative retrolisthesis at L2-3, L3-4, and L4-5. Vertebrae: No fracture or acute bony finding. Loss of disc height with some vacuum disc phenomenon at L1-2. Paraspinal and other soft tissues: No paraspinal edema; otherwise please see dedicated CT abdomen report. Disc levels: L1-2: Unremarkable L2-3: Suspected disc bulge and left inferior foraminal disc protrusion, possible displacement of the left L2 nerve in the lateral extraforaminal space. L3-4: Unremarkable. L4-5: Unremarkable. L5-S1: Possible left lateral recess disc and foraminal disc protrusion from L5-S1 which may impinge on the left L5 nerve root although the appearance could be artifactual. IMPRESSION: 1. Suspected left inferior foraminal disc protrusion at L2-3 with  possible displacement of the left L2 nerve in the lateral extraforaminal space. 2. Possible left lateral recess disc and foraminal disc protrusion at L5-S1 which may impinge on the left L5 nerve roots although the appearance could be artifactual. 3. Mild degenerative retrolisthesis at L2-3,  L3-4, and L4-5. Electronically Signed   By: Van Clines M.D.   On: 06/01/2022 14:50   CT Abdomen Pelvis W Contrast  Result Date: 06/01/2022 CLINICAL DATA:  Abdominal and low back pain EXAM: CT ABDOMEN AND PELVIS WITH CONTRAST TECHNIQUE: Multidetector CT imaging of the abdomen and pelvis was performed using the standard protocol following bolus administration of intravenous contrast. RADIATION DOSE REDUCTION: This exam was performed according to the departmental dose-optimization program which includes automated exposure control, adjustment of the mA and/or kV according to patient size and/or use of iterative reconstruction technique. CONTRAST:  81m OMNIPAQUE IOHEXOL 350 MG/ML SOLN COMPARISON:  CT scan 05/26/2022 FINDINGS: Lower chest: Unremarkable Hepatobiliary: 3.1 by 3.0 cm hypodense lesion in the left hepatic lobe on image 15 series 3, formerly 1.3 by 1.4 cm on 03/14/2018, formerly hyperechoic on ultrasound, with some marginal nodular discontinuous enhancement, appearance favors hepatic hemangioma. 0.5 cm hypodense lesion in the left hepatic lobe on image 10 series 3, stable from 03/14/2018, technically nonspecific although statistically likely to be benign given the long-term stability. Peripheral nodular enhancement in a 3.9 by 2.5 cm lesion posteriorly in the right hepatic lobe on image 22 of series 3 corresponding to a previous hyperechoic lesion. This previously measured 4.1 by 3.1 cm on 03/14/2018. Again, very likely to be a hepatic hemangioma. A subtle hypodense 0.9 cm lesion in the right hepatic lobe on image 16 of series 3 is roughly stable from 03/14/2018, technically nonspecific although again this is  probably a hemangioma and demonstrates long-term stability. Prior cholecystectomy. Common bile duct 0.7 cm in diameter on image 25 series 3, mild prominence is likely a physiologic response to cholecystectomy. Pancreas: Unremarkable Spleen: Unremarkable Adrenals/Urinary Tract: Both adrenal glands appear normal. 0.7 by 0.5 cm hypodense lesion in the right kidney lower pole on image 36 of series 3 is technically nonspecific due to small size although statistically likely to be a small cyst. No further imaging workup of this lesion is indicated. No hydronephrosis or urinary tract calculi. Adrenal glands and urinary bladder unremarkable. Stomach/Bowel: Frothy fluid throughout the borderline prominent colon and in the rectum, distal colonic air-levels indicating diarrheal process. Borderline dilated appendiceal tip but without wall thickening to suggest inflammation. There scattered air-fluid levels in nondilated loops of small bowel. No substantial small bowel or colonic wall thickening observed. Vascular/Lymphatic: Minimal abdominal aortic atherosclerosis. No pathologic adenopathy. Reproductive: IUD along the endometrium. 3.6 by 2.8 cm simple appearing left adnexal cyst, slightly enlarged from 05/26/2022. No follow-up imaging recommended. Note: This recommendation does not apply to premenarchal patients and to those with increased risk (genetic, family history, elevated tumor markers or other high-risk factors) of ovarian cancer. Reference: JACR 2020 Feb; 17(2):248-254 Other: No supplemental non-categorized findings. Musculoskeletal: Small umbilical hernia contains adipose tissue. IMPRESSION: 1. Frothy fluid throughout the borderline prominent colon and in the rectum, distal colonic air-levels indicating diarrheal process. Scattered air-fluid levels in nondilated loops of small bowel. 2. Several hepatic lesions are present, most of which are stable from 03/14/2018 (although one is enlarged) and most compatible with  hepatic hemangiomas. These have demonstrate hyperechogenicity on prior ultrasounds, characteristic of hemangiomas. If the patient has abnormal liver enzymes or if other further workup is clinically indicated, hepatic protocol MRI with and without contrast could be utilized for definitive characterization. 3. Minimal abdominal aortic atherosclerosis. 4. Small umbilical hernia contains adipose tissue. 5. 3.6 by 2.8 cm simple appearing left adnexal cyst, slightly enlarged from 05/26/2022. No follow-up imaging recommended. 6. IUD along the endometrium. Aortic  Atherosclerosis (ICD10-I70.0). Electronically Signed   By: Van Clines M.D.   On: 06/01/2022 14:36    Procedures Procedures    Medications Ordered in ED Medications  HYDROmorphone (DILAUDID) injection 1 mg (has no administration in time range)  dicyclomine (BENTYL) capsule 20 mg (has no administration in time range)  predniSONE (DELTASONE) tablet 50 mg (has no administration in time range)  methocarbamol (ROBAXIN) tablet 500 mg (has no administration in time range)  sodium chloride 0.9 % bolus 1,000 mL (1,000 mLs Intravenous New Bag/Given 06/01/22 1340)  ondansetron (ZOFRAN) injection 4 mg (4 mg Intravenous Given 06/01/22 1334)  morphine (PF) 4 MG/ML injection 4 mg (4 mg Intravenous Given 06/01/22 1332)  iohexol (OMNIPAQUE) 350 MG/ML injection 75 mL (75 mLs Intravenous Contrast Given 06/01/22 1414)    ED Course/ Medical Decision Making/ A&P                           Medical Decision Making Amount and/or Complexity of Data Reviewed Labs: ordered. Radiology: ordered.  Risk Prescription drug management.   46 y.o. female presents to the ED with complaints of back pain and diarrhea, this involves an extensive number of treatment options, and is a complaint that carries with it a high risk of complications and morbidity.  The differential diagnosis includes colitis, const patient, bowel obstruction, herniated disc, musculoskeletal back  pain, muscle spasm.  Considered cauda equina syndrome but given patient's normal rectal tone and no focal neurologic deficits I have low suspicion for this.  Considered epidural abscess as well but patient does not have risk factors for this, has not had any fevers or chills, and again no neurologic deficits.  On arrival pt is nontoxic, vitals significant for mild hypertension and tachycardia on initial arrival.  Vitals normal lysed with treatment of pain  Additional history obtained from chart review. Previous records obtained and reviewed in particular ED encounter from 11/3  I ordered medication for pain including morphine and Zofran, IV fluid bolus given as well.  Lab Tests:  I Ordered, reviewed, and interpreted labs, which included: No leukocytosis, normal hemoglobin, no electrolyte derangements, normal renal and liver function normal lipase, UA without signs of infection.  Imaging Studies ordered:  I ordered imaging studies which included CT abdomen pelvis with recon of the lumbar spine, I independently visualized and interpreted imaging which showed CT no longer shows signs of constipation, now shows liquid stool prominent in the colon more so suggestive of diarrhea.  Stable liver lesions, patient without right upper quadrant pain or abnormal LFTs do not feel this requires further emergent work-up.  Small umbilical hernia noted.  2.8 cm simple appearing left adnexal cyst.  CT of the lumbar spine shows left inferior foraminal disc protrusion at L2-3 as well as a possible left lateral recess disc and foraminal disc protrusion at L5-S1 as well as some other degenerative changes.  Fortunately with these patient has no neurologic symptoms on exam  ED Course:   I discussed results of work-up with patient.  Fortunately no longer signs of severe constipation.  We will have patient stop enemas and pause bowel regimen until diarrhea resolves, encourage bland foods as well.  Provided reassurance that  this does not appear to be due to an abdominal issue.  Given findings on CT of the lumbar spine I suspect this is more likely the cause of her back pain.  In addition to her home pain medications will do short burst of steroid  and prescribe muscle relaxer as needed as well.  Patient to follow-up with her regular doctor as well as [facility].  Discussed outpatient follow-up and return precautions at length.  Discharged home in good condition.  Portions of this note were generated with [employer]. Dictation errors may occur despite best attempts at proofreading.         Final Clinical Impression(s) / ED Diagnoses Final diagnoses:  Right low back pain, unspecified chronicity, unspecified whether sciatica present  Diarrhea, unspecified type  Protrusion of lumbar intervertebral disc    Rx / DC Orders ED Discharge Orders          Ordered    predniSONE (DELTASONE) 50 MG tablet  Daily        06/01/22 1544    methocarbamol (ROBAXIN) 500 MG tablet  2 times daily        06/01/22 1544              Benedetto Goad [place], [place] 06/01/22 1647    Audley Hose, MD 06/02/22 [phone]

## 2022-06-01 NOTE — Discharge Instructions (Addendum)
Your CT scan no longer shows constipation but instead shows signs of diarrhea and liquid stool.  Please discontinue enemas, stop Colace and MiraLAX.  Once her bowels have returned to normal you can use MiraLAX daily or every other day as needed to continue regular bowel movements.  Since you are on chronic pain medication this may contribute to constipation.  Eat a bland diet with plenty of fiber over the next few days to help resolve diarrhea.  The rest of your abdominal scan was reassuring.  CT scan of your lumbar spine did show disc protrusions at L2-L3 and L5-S1.  This may be contributing to the back pain you are experiencing.  You can use the exercises provided on your paperwork today and in addition to your home pain medications please take 5 days of higher dose steroid and use muscle relaxer as needed.  Follow-up with your regular doctor but also follow-up with Anne Jefferson with [facility] specialist for further evaluation.  Avoid heavy lifting or forward bending.

## 2022-06-01 NOTE — ED Notes (Signed)
Rn was getting ready to place IV in patient arm, but she reported that she had to go to the bathroom, because she continue to have diarrhea

## 2022-06-02 ENCOUNTER — Ambulatory Visit: Payer: 59 | Admitting: Physical Therapy

## 2022-06-06 DIAGNOSIS — M546 Pain in thoracic spine: Secondary | ICD-10-CM | POA: Diagnosis not present

## 2022-06-06 DIAGNOSIS — G894 Chronic pain syndrome: Secondary | ICD-10-CM | POA: Diagnosis not present

## 2022-06-06 DIAGNOSIS — M069 Rheumatoid arthritis, unspecified: Secondary | ICD-10-CM | POA: Diagnosis not present

## 2022-06-06 DIAGNOSIS — M5412 Radiculopathy, cervical region: Secondary | ICD-10-CM | POA: Diagnosis not present

## 2022-06-06 DIAGNOSIS — M545 Low back pain, unspecified: Secondary | ICD-10-CM | POA: Diagnosis not present

## 2022-06-07 ENCOUNTER — Ambulatory Visit (INDEPENDENT_AMBULATORY_CARE_PROVIDER_SITE_OTHER): Payer: 59 | Admitting: *Deleted

## 2022-06-07 DIAGNOSIS — J309 Allergic rhinitis, unspecified: Secondary | ICD-10-CM

## 2022-06-09 ENCOUNTER — Ambulatory Visit: Payer: 59 | Admitting: Physical Therapy

## 2022-06-13 NOTE — Therapy (Incomplete)
OUTPATIENT PHYSICAL THERAPY VESTIBULAR EVALUATION     Patient Name: Anne Jefferson MRN: [id number] DOB:10-25-1975, 46 y.o., female Today's Date: 06/13/2022  END OF SESSION:   Past Medical History:  Diagnosis Date   Abnormal Pap smear    Anxiety    no meds   Asthma    mild, no issue for years no  inhaler use except in winter   Biliary colic    Blood type, Rh negative    Gallstones    H/O dysmenorrhea    Infertility, female    Nausea and vomiting    Pain    back and chest realted to gallbladder since november 2018   PONV (postoperative nausea and vomiting)    severe, needs heavy anti nausea meds   Spinal stenosis    Past Surgical History:  Procedure Laterality Date    c sections     x 2  3 births last was twins   BUNIONECTOMY Right    CHOLECYSTECTOMY N/A 11/22/2017   Procedure: [place];  Surgeon: Mickeal Skinner, MD;  Location: [facility];  Service: General;  Laterality: N/A;   LEEP  12/23/2009   WISDOM TOOTH EXTRACTION     Patient Active Problem List   Diagnosis Date Noted   Lightheadedness 02/03/2022   Anxiety 02/03/2022   Obesity (BMI 30-39.9) 02/03/2022   Abnormal TSH 02/03/2022   Cervical spondylosis with radiculopathy 02/02/2022   Rheumatoid arthritis ([place]) 02/02/2022   Adult stuttering 02/01/2022   History of nasal polyposis 11/17/2021   Acute cough 10/20/2021   Viral illness 10/20/2021   Moderate persistent asthma without complication 19/14/7829   Moderate persistent asthma with (acute) exacerbation 11/16/2020   Shooting pain 07/29/2020   Atopic dermatitis 04/01/2019   Perennial and seasonal allergic rhinitis 09/17/2018   Acute non-recurrent frontal sinusitis 09/17/2018   Infertility, female    Abnormal Pap smear    H/O dysmenorrhea    Blood type, Rh negative    METATARSALGIA 12/18/2007   BUNIONS, LEFT FOOT 12/18/2007   UNEQUAL LEG LENGTH 12/18/2007   SPRAIN&STRAIN OF UNSPECIFIED SITE OF KNEE&LEG  11/13/2007    PCP: Josetta Huddle, MD  REFERRING PROVIDER: Kathalene Frames, MD  REFERRING DIAG: R42 (ICD-10-CM) - Dizziness and giddiness M54.50 (ICD-10-CM) - Low back pain, unspecified  THERAPY DIAG:  No diagnosis found.  ONSET DATE: ***  Rationale for Evaluation and Treatment: Rehabilitation  SUBJECTIVE:   SUBJECTIVE STATEMENT: *** Pt accompanied by: {accompnied:27141}  PERTINENT HISTORY: anxiety, asthma, R bunionectomy  PAIN:  Are you having pain? {OPRCPAIN:27236}  PRECAUTIONS: Fall  WEIGHT BEARING RESTRICTIONS: No  FALLS: Has patient fallen in last 6 months? {fallsyesno:27318}  LIVING ENVIRONMENT: Lives with: {OPRC lives with:25569::"lives with their family"} Lives in: {Lives in:25570} Stairs: {opstairs:27293} Has following equipment at home: {Assistive devices:23999}  PLOF: {PLOF:24004}  PATIENT GOALS: ***  OBJECTIVE:   DIAGNOSTIC FINDINGS: 06/01/22 lumbar CT: Suspected left inferior foraminal disc protrusion at L2-3 with possible displacement of the left L2 nerve in the lateral extraforaminal space.Possible left lateral recess disc and foraminal disc protrusion at L5-S1 which may impinge on the left L5 nerve roots although the appearance could be artifactual.   COGNITION: Overall cognitive status: {cognition:24006}   SENSATION: {sensation:27233}   POSTURE:  {posture:25561}  Cervical ROM:    Active A/PROM (deg) eval  Flexion   Extension   Right lateral flexion   Left lateral flexion   Right rotation   Left rotation   (Blank rows = not tested)  LUMBAR ROM:  Active  A/PROM  eval  Flexion   Extension   Right lateral flexion   Left lateral flexion   Right rotation   Left rotation    (Blank rows = not tested)   LOWER EXTREMITY MMT:   MMT Right eval Left eval  Hip flexion    Hip abduction    Hip adduction    Hip internal rotation    Hip external rotation    Knee flexion    Knee extension    Ankle dorsiflexion     Ankle plantarflexion    Ankle inversion    Ankle eversion    (Blank rows = not tested)   GAIT: Gait pattern: {gait characteristics:25376} Distance walked: *** Assistive device utilized: {Assistive devices:23999} Level of assistance: {Levels of assistance:24026} Comments: ***  FUNCTIONAL TESTS:  {Functional tests:24029}    VESTIBULAR ASSESSMENT:  GENERAL OBSERVATION: ***  OCULOMOTOR EXAM:  Ocular Alignment: {Ocular Alignment:25262}  Ocular ROM: {RANGE OF MOTION:21649}  Spontaneous Nystagmus: {Spontaneous nystagmus:25263}  Gaze-Induced Nystagmus: {gaze-induced nystagmus:25264}  Smooth Pursuits: {smooth pursuit:25265}  Saccades: {saccades:25266}  Convergence/Divergence: *** cm    VESTIBULAR - OCULAR REFLEX:   Slow VOR: {slow VOR:25290}  VOR Cancellation: {vor cancellation:25291}  Head-Impulse Test: {head impulse test:25272}  Dynamic Visual Acuity: {dynamic visual acuity:25273}    POSITIONAL TESTING:  Right Roll Test: ***; Duration: *** Left Roll Test: ***; Duration: *** Right Dix-Hallpike: ***; Duration:*** Left Dix-Hallpike: ***; Duration: *** Right Sidelying: ***; Duration: *** Left Sidelying: ***; Duration: *** Deep Head Hang: ***; Duration: *** Other: ***    VESTIBULAR TREATMENT:                                                                                                   DATE: ***  Canalith Repositioning:  {Canalith Repositioning:25283} Gaze Adaptation:  {gaze adaptation:25286} Habituation:  {habituation:25288} Other: ***  PATIENT EDUCATION: Education details: *** Person educated: {Person educated:25204} Education method: {Education Method:25205} Education comprehension: {Education Comprehension:25206}  HOME EXERCISE PROGRAM:  GOALS: Goals reviewed with patient? {yes/no:20286}  SHORT TERM GOALS: Target date: {follow up:25551}  Patient to be independent with initial HEP. Baseline: HEP initiated Goal status:  {GOALSTATUS:25110}    LONG TERM GOALS: Target date: {follow up:25551}  Patient to be independent with advanced HEP. Baseline: Not yet initiated  Goal status: {GOALSTATUS:25110}  Patient to report 0/10 dizziness with standing vertical and horizontal VOR for 30 seconds. Baseline: Unable Goal status: {GOALSTATUS:25110}  Patient will report 0/10 dizziness with bed mobility.  Baseline: Symptomatic  Goal status: {GOALSTATUS:25110}  Patient to demonstrate *** sway with M-CTSIB condition with eyes closed/foam surface in order to improve safety in environments with uneven surfaces and dim lighting. Baseline: *** Goal status: {GOALSTATUS:25110}  Patient to score at least 20/24 on DGI in order to decrease risk of falls. Baseline: *** Goal status: {GOALSTATUS:25110}  Patient will ambulate over outdoor surfaces with LRAD while performing head turns to scan environment with good stability in order to indicate safe community mobility. Baseline: Unable Goal status: {GOALSTATUS:25110}  Patient to score at least *** on FOTO in order to indicate improved functional outcomes.  Baseline: *** Goal  status: {GOALSTATUS:25110}     ASSESSMENT:  CLINICAL IMPRESSION:   Patient is a 46 y/o F presenting to [facility] with c/o dizziness for the past ***.   Denies head trauma, infection/illness, vision changes/double vision, hearing loss, tinnitus, otalgia, photo/phonophobia.  Oculomotor exam revealed ***.  Positional testing was ***.   Patient was educated on gentle *** HEP and reported understanding. Would benefit from skilled PT services ***x/week for *** weeks to address aforementioned impairments in order to optimize level of function.    OBJECTIVE IMPAIRMENTS: {opptimpairments:25111}.   ACTIVITY LIMITATIONS: {activitylimitations:27494}  PARTICIPATION LIMITATIONS: {participationrestrictions:25113}  PERSONAL FACTORS: {Personal factors:25162} are also affecting patient's functional outcome.    REHAB POTENTIAL: {rehabpotential:25112}  CLINICAL DECISION MAKING: {clinical decision making:25114}  EVALUATION COMPLEXITY: {Evaluation complexity:25115}   PLAN:  PT FREQUENCY: {rehab frequency:25116}  PT DURATION: {rehab duration:25117}  PLANNED INTERVENTIONS: {rehab planned interventions:25118::"Therapeutic exercises","Therapeutic activity","Neuromuscular re-education","Balance training","Gait training","Patient/Family education","Self Care","Joint mobilization"}  PLAN FOR NEXT SESSION: Manuela Neptune, PT 06/13/2022, 8:17 AM

## 2022-06-14 ENCOUNTER — Ambulatory Visit: Payer: 59 | Admitting: Physical Therapy

## 2022-06-19 ENCOUNTER — Ambulatory Visit: Payer: 59 | Admitting: Podiatry

## 2022-06-19 DIAGNOSIS — M2011 Hallux valgus (acquired), right foot: Secondary | ICD-10-CM | POA: Diagnosis not present

## 2022-06-19 DIAGNOSIS — S90821A Blister (nonthermal), right foot, initial encounter: Secondary | ICD-10-CM

## 2022-06-19 DIAGNOSIS — L603 Nail dystrophy: Secondary | ICD-10-CM | POA: Diagnosis not present

## 2022-06-19 DIAGNOSIS — M21611 Bunion of right foot: Secondary | ICD-10-CM

## 2022-06-19 NOTE — Progress Notes (Signed)
  Subjective:  Patient ID: Anne Jefferson, female    DOB: [birth year],  MRN: [id number]  Chief Complaint  Patient presents with   Nail Problem    Toenails thick and falling off. 4 fell off within last 6 weeks without reason - aware appt is in [place]   blisters    She hikes a lot and cannot go far without getting blisters and calluses that bleed and drain    46 y.o. female presents with the above complaint. History confirmed with patient.  She notes a blisters that developed recently.  They have started to improve.  Objective:  Physical Exam: warm, good capillary refill, no trophic changes or ulcerative lesions, normal DP and PT pulses, normal sensory exam, and she has valgus deformity with a well-healed blister on the plantar medial first MPJ.  Dystrophy of left fourth toenail with thickening and discoloration   Radiographs: Multiple views x-ray of right feet: no fracture, dislocation, swelling or degenerative changes noted and there is dorsal spurring around the first TMT Assessment:   1. Nail dystrophy   2. Blister of right foot, initial encounter   3. Hallux valgus with bunions, right      Plan:  Patient was evaluated and treated and all questions answered.  Discussed proper shoe gear sizing and fit I think she likely has pressure on the toes from her shoes and I recommended she be refitted for hiking shoes with proper fit.  Discussed offloading with silicone pads as well as a bunion spacer and shield that should alleviate this as well.  She will get this on [employer].  Regarding her nail thickening and changes I think this likely is nail dystrophy from the same issue from pressure.  Nail sample of the left fourth toenail was taken today for a culture and will let her know what the results of this show and discussed further treatment as needed  Return if symptoms worsen or fail to improve.

## 2022-06-19 NOTE — Patient Instructions (Signed)
Look for larger hiking shoes with a roomy toe box. May want to get fitted at [facility] or [employer] in [place] with spacers can be bought on [employer]

## 2022-06-21 DIAGNOSIS — J3089 Other allergic rhinitis: Secondary | ICD-10-CM | POA: Diagnosis not present

## 2022-06-21 NOTE — Therapy (Signed)
OUTPATIENT PHYSICAL THERAPY VESTIBULAR EVALUATION     Patient Name: Anne Jefferson MRN: [id number] DOB:1976/05/08, 46 y.o., female Today's Date: 06/13/2022  END OF SESSION:   Past Medical History:  Diagnosis Date   Abnormal Pap smear    Anxiety    no meds   Asthma    mild, no issue for years no  inhaler use except in winter   Biliary colic    Blood type, Rh negative    Gallstones    H/O dysmenorrhea    Infertility, female    Nausea and vomiting    Pain    back and chest realted to gallbladder since november 2018   PONV (postoperative nausea and vomiting)    severe, needs heavy anti nausea meds   Spinal stenosis    Past Surgical History:  Procedure Laterality Date    c sections     x 2  3 births last was twins   BUNIONECTOMY Right    CHOLECYSTECTOMY N/A 11/22/2017   Procedure: [place];  Surgeon: Mickeal Skinner, MD;  Location: [facility];  Service: General;  Laterality: N/A;   LEEP  12/23/2009   WISDOM TOOTH EXTRACTION     Patient Active Problem List   Diagnosis Date Noted   Lightheadedness 02/03/2022   Anxiety 02/03/2022   Obesity (BMI 30-39.9) 02/03/2022   Abnormal TSH 02/03/2022   Cervical spondylosis with radiculopathy 02/02/2022   Rheumatoid arthritis ([place]) 02/02/2022   Adult stuttering 02/01/2022   History of nasal polyposis 11/17/2021   Acute cough 10/20/2021   Viral illness 10/20/2021   Moderate persistent asthma without complication 59/16/3846   Moderate persistent asthma with (acute) exacerbation 11/16/2020   Shooting pain 07/29/2020   Atopic dermatitis 04/01/2019   Perennial and seasonal allergic rhinitis 09/17/2018   Acute non-recurrent frontal sinusitis 09/17/2018   Infertility, female    Abnormal Pap smear    H/O dysmenorrhea    Blood type, Rh negative    METATARSALGIA 12/18/2007   BUNIONS, LEFT FOOT 12/18/2007   UNEQUAL LEG LENGTH 12/18/2007   SPRAIN&STRAIN OF UNSPECIFIED SITE OF KNEE&LEG  11/13/2007    PCP: Josetta Huddle, MD  REFERRING PROVIDER: Kathalene Frames, MD  REFERRING DIAG: R42 (ICD-10-CM) - Dizziness and giddiness M54.50 (ICD-10-CM) - Low back pain, unspecified  THERAPY DIAG:  No diagnosis found.  ONSET DATE: ***  Rationale for Evaluation and Treatment: Rehabilitation  SUBJECTIVE:   SUBJECTIVE STATEMENT: *** Pt accompanied by: {accompnied:27141}  PERTINENT HISTORY: anxiety, asthma, R bunionectomy  PAIN:  Are you having pain? {OPRCPAIN:27236}  PRECAUTIONS: Fall  WEIGHT BEARING RESTRICTIONS: No  FALLS: Has patient fallen in last 6 months? {fallsyesno:27318}  LIVING ENVIRONMENT: Lives with: {OPRC lives with:25569::"lives with their family"} Lives in: {Lives in:25570} Stairs: {opstairs:27293} Has following equipment at home: {Assistive devices:23999}  PLOF: {PLOF:24004}  PATIENT GOALS: ***  OBJECTIVE:   DIAGNOSTIC FINDINGS: 06/01/22 lumbar CT: Suspected left inferior foraminal disc protrusion at L2-3 with possible displacement of the left L2 nerve in the lateral extraforaminal space.Possible left lateral recess disc and foraminal disc protrusion at L5-S1 which may impinge on the left L5 nerve roots although the appearance could be artifactual.   COGNITION: Overall cognitive status: {cognition:24006}   SENSATION: {sensation:27233}   POSTURE:  {posture:25561}  Cervical ROM:    Active A/PROM (deg) eval  Flexion   Extension   Right lateral flexion   Left lateral flexion   Right rotation   Left rotation   (Blank rows = not tested)  LUMBAR ROM:  Active  A/PROM  eval  Flexion   Extension   Right lateral flexion   Left lateral flexion   Right rotation   Left rotation    (Blank rows = not tested)   LOWER EXTREMITY MMT:   MMT Right eval Left eval  Hip flexion    Hip abduction    Hip adduction    Hip internal rotation    Hip external rotation    Knee flexion    Knee extension    Ankle dorsiflexion     Ankle plantarflexion    Ankle inversion    Ankle eversion    (Blank rows = not tested)   GAIT: Gait pattern: {gait characteristics:25376} Distance walked: *** Assistive device utilized: {Assistive devices:23999} Level of assistance: {Levels of assistance:24026} Comments: ***  FUNCTIONAL TESTS:  {Functional tests:24029}    VESTIBULAR ASSESSMENT:  GENERAL OBSERVATION: ***  OCULOMOTOR EXAM:  Ocular Alignment: {Ocular Alignment:25262}  Ocular ROM: {RANGE OF MOTION:21649}  Spontaneous Nystagmus: {Spontaneous nystagmus:25263}  Gaze-Induced Nystagmus: {gaze-induced nystagmus:25264}  Smooth Pursuits: {smooth pursuit:25265}  Saccades: {saccades:25266}  Convergence/Divergence: *** cm    VESTIBULAR - OCULAR REFLEX:   Slow VOR: {slow VOR:25290}  VOR Cancellation: {vor cancellation:25291}  Head-Impulse Test: {head impulse test:25272}  Dynamic Visual Acuity: {dynamic visual acuity:25273}    POSITIONAL TESTING:  Right Roll Test: ***; Duration: *** Left Roll Test: ***; Duration: *** Right Dix-Hallpike: ***; Duration:*** Left Dix-Hallpike: ***; Duration: *** Right Sidelying: ***; Duration: *** Left Sidelying: ***; Duration: *** Deep Head Hang: ***; Duration: *** Other: ***    VESTIBULAR TREATMENT:                                                                                                   DATE: ***  Canalith Repositioning:  {Canalith Repositioning:25283} Gaze Adaptation:  {gaze adaptation:25286} Habituation:  {habituation:25288} Other: ***  PATIENT EDUCATION: Education details: *** Person educated: {Person educated:25204} Education method: {Education Method:25205} Education comprehension: {Education Comprehension:25206}  HOME EXERCISE PROGRAM:  GOALS: Goals reviewed with patient? {yes/no:20286}  SHORT TERM GOALS: Target date: {follow up:25551}  Patient to be independent with initial HEP. Baseline: HEP initiated Goal status:  {GOALSTATUS:25110}    LONG TERM GOALS: Target date: {follow up:25551}  Patient to be independent with advanced HEP. Baseline: Not yet initiated  Goal status: {GOALSTATUS:25110}  Patient to report 0/10 dizziness with standing vertical and horizontal VOR for 30 seconds. Baseline: Unable Goal status: {GOALSTATUS:25110}  Patient will report 0/10 dizziness with bed mobility.  Baseline: Symptomatic  Goal status: {GOALSTATUS:25110}  Patient to demonstrate *** sway with M-CTSIB condition with eyes closed/foam surface in order to improve safety in environments with uneven surfaces and dim lighting. Baseline: *** Goal status: {GOALSTATUS:25110}  Patient to score at least 20/24 on DGI in order to decrease risk of falls. Baseline: *** Goal status: {GOALSTATUS:25110}  Patient will ambulate over outdoor surfaces with LRAD while performing head turns to scan environment with good stability in order to indicate safe community mobility. Baseline: Unable Goal status: {GOALSTATUS:25110}  Patient to score at least *** on FOTO in order to indicate improved functional outcomes.  Baseline: *** Goal  status: {GOALSTATUS:25110}     ASSESSMENT:  CLINICAL IMPRESSION:   Patient is a 46 y/o F presenting to OPPT with c/o dizziness for the past ***.   Denies head trauma, infection/illness, vision changes/double vision, hearing loss, tinnitus, otalgia, photo/phonophobia.  Oculomotor exam revealed ***.  Positional testing was ***.   Patient was educated on gentle *** HEP and reported understanding. Would benefit from skilled PT services ***x/week for *** weeks to address aforementioned impairments in order to optimize level of function.    OBJECTIVE IMPAIRMENTS: {opptimpairments:25111}.   ACTIVITY LIMITATIONS: {activitylimitations:27494}  PARTICIPATION LIMITATIONS: {participationrestrictions:25113}  PERSONAL FACTORS: {Personal factors:25162} are also affecting patient's functional outcome.    REHAB POTENTIAL: {rehabpotential:25112}  CLINICAL DECISION MAKING: {clinical decision making:25114}  EVALUATION COMPLEXITY: {Evaluation complexity:25115}   PLAN:  PT FREQUENCY: {rehab frequency:25116}  PT DURATION: {rehab duration:25117}  PLANNED INTERVENTIONS: {rehab planned interventions:25118::"Therapeutic exercises","Therapeutic activity","Neuromuscular re-education","Balance training","Gait training","Patient/Family education","Self Care","Joint mobilization"}  PLAN FOR NEXT SESSION: Manuela Neptune, PT 06/13/2022, 8:17 AM

## 2022-06-21 NOTE — Progress Notes (Signed)
VIALS EXP 06-22-23 

## 2022-06-22 ENCOUNTER — Other Ambulatory Visit: Payer: Self-pay

## 2022-06-22 ENCOUNTER — Ambulatory Visit: Payer: 59 | Admitting: Physical Therapy

## 2022-06-22 DIAGNOSIS — R2681 Unsteadiness on feet: Secondary | ICD-10-CM

## 2022-06-22 DIAGNOSIS — H8112 Benign paroxysmal vertigo, left ear: Secondary | ICD-10-CM | POA: Diagnosis not present

## 2022-06-22 DIAGNOSIS — R293 Abnormal posture: Secondary | ICD-10-CM | POA: Diagnosis present

## 2022-06-22 DIAGNOSIS — M5459 Other low back pain: Secondary | ICD-10-CM | POA: Diagnosis present

## 2022-06-22 DIAGNOSIS — M542 Cervicalgia: Secondary | ICD-10-CM | POA: Diagnosis not present

## 2022-06-22 DIAGNOSIS — R42 Dizziness and giddiness: Secondary | ICD-10-CM | POA: Diagnosis present

## 2022-06-22 NOTE — Therapy (Addendum)
OUTPATIENT PHYSICAL THERAPY NEURO TREATMENT     Patient Name: Anne Jefferson MRN: [id number] DOB:01/25/1976, 46 y.o., female Today's Date: 06/26/2022  END OF SESSION:  PT End of Session - 06/26/22 0844     Visit Number 2    Number of Visits 13    Date for PT Re-Evaluation 08/03/22    Authorization Type [employer]    PT Start Time 0801    PT Stop Time 0839    PT Time Calculation (min) 38 min    Equipment Utilized During Treatment Gait belt    Activity Tolerance Patient tolerated treatment well;Other (comment)   nausea   Behavior During Therapy [facility] for tasks assessed/performed              Past Medical History:  Diagnosis Date   Abnormal Pap smear    Anxiety    no meds   Asthma    mild, no issue for years no  inhaler use except in winter   Biliary colic    Blood type, Rh negative    Gallstones    H/O dysmenorrhea    Infertility, female    Nausea and vomiting    Pain    back and chest realted to gallbladder since november 2018   PONV (postoperative nausea and vomiting)    severe, needs heavy anti nausea meds   Spinal stenosis    Past Surgical History:  Procedure Laterality Date    c sections     x 2  3 births last was twins   BUNIONECTOMY Right    CHOLECYSTECTOMY N/A 11/22/2017   Procedure: [place];  Surgeon: Mickeal Skinner, MD;  Location: [facility];  Service: General;  Laterality: N/A;   LEEP  12/23/2009   WISDOM TOOTH EXTRACTION     Patient Active Problem List   Diagnosis Date Noted   Lightheadedness 02/03/2022   Anxiety 02/03/2022   Obesity (BMI 30-39.9) 02/03/2022   Abnormal TSH 02/03/2022   Cervical spondylosis with radiculopathy 02/02/2022   Rheumatoid arthritis ([place]) 02/02/2022   Adult stuttering 02/01/2022   History of nasal polyposis 11/17/2021   Acute cough 10/20/2021   Viral illness 10/20/2021   Moderate persistent asthma without complication 65/68/1275   Moderate persistent asthma with (acute)  exacerbation 11/16/2020   Shooting pain 07/29/2020   Atopic dermatitis 04/01/2019   Perennial and seasonal allergic rhinitis 09/17/2018   Acute non-recurrent frontal sinusitis 09/17/2018   Infertility, female    Abnormal Pap smear    H/O dysmenorrhea    Blood type, Rh negative    METATARSALGIA 12/18/2007   BUNIONS, LEFT FOOT 12/18/2007   UNEQUAL LEG LENGTH 12/18/2007   SPRAIN&STRAIN OF UNSPECIFIED SITE OF KNEE&LEG 11/13/2007    PCP: Josetta Huddle, MD  REFERRING PROVIDER: Kathalene Frames, MD  REFERRING DIAG: R42 (ICD-10-CM) - Dizziness and giddiness M54.50 (ICD-10-CM) - Low back pain, unspecified  THERAPY DIAG:  Cervicalgia  Other low back pain  Abnormal posture  Dizziness and giddiness  Unsteadiness on feet  ONSET DATE: October 2023  Rationale for Evaluation and Treatment: Rehabilitation  SUBJECTIVE:   SUBJECTIVE STATEMENT: Pain was really bad in the neck yesterday. [facility]- ordered a neck MRI. No other changes. Has a 5k coming up. Pt accompanied by: self  PERTINENT HISTORY: anxiety, asthma, R bunionectomy  PAIN:  Are you having pain? Yes: NPRS scale: 4/10 Pain location: base of spine Pain description: dull  Aggravating factors: in the AM Relieving factors: movement   PAIN:  Are you having pain? Yes: NPRS scale: 5/10 Pain location: neck Pain description: constant Aggravating factors: driving, sitting, standing Relieving factors: laying own    PRECAUTIONS: Fall  WEIGHT BEARING RESTRICTIONS: No  FALLS: Has patient fallen in last 6 months? 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   PATIENT GOALS: improve pain  OBJECTIVE:    TODAY'S TREATMENT: 06/26/22  VESTIBULAR ASSESSMENT   GENERAL OBSERVATION: pt does not wear glasses       OCULOMOTOR  EXAM:   Ocular Alignment:  L eye hypertropia   Ocular ROM:  L eye elevated, inferior motions limited   Spontaneous Nystagmus: absent   Gaze-Induced Nystagmus: absent   Smooth Pursuits: intact   Saccades:  slowed inferiorly    Convergence/Divergence: ~1 ft    VESTIBULAR - OCULAR REFLEX:    Slow VOR: slow and discontinuous head turns and c/o diplopia; c/o "doesn't feel good"   VOR Cancellation: Normal report of "choppy" vision    Head-Impulse Test: HIT Right: positive HIT Left: negative       POSITIONAL TESTING:  Right Roll Test: negative Left Roll Test: negative Right Sidelying: few beats of R upbeating torsional nystagmus; mild dizziness upon laying down and sitting up Left Sidelying: negative; mild dizziness upon laying down and sitting up      [facility] PT Assessment - 06/26/22 0001       Dynamic Gait Index   Level Surface Normal    Change in Gait Speed Normal    Gait with Horizontal Head Turns Mild Impairment    Gait with Vertical Head Turns Moderate Impairment    Gait and Pivot Turn Mild Impairment    Step Over Obstacle Mild Impairment    Step Around Obstacles Normal    Steps Normal    Total Score 19    DGI comment: c/o dizziness             HOME EXERCISE PROGRAM Last updated: 06/26/22 Access Code: [facility] URL: [email or web address] Date: 06/26/2022 Prepared by: [facility]  Exercises - Seated Cervical Retraction  - 1 x daily - 5 x weekly - 2 sets - 10 reps - 3 sec hold - Seated Thoracic Lumbar Extension  - 1 x daily - 5 x weekly - 2 sets - 10 reps - Supine Shoulder Horizontal Abduction with Resistance  - 1 x daily - 5 x weekly - 2 sets - 10 reps - Prone on Elbows Stretch  - 1 x daily - 5 x weekly - 2 sets - 10 reps - Standing with Head Rotation  - 1 x daily - 5 x weekly - 2-3 sets - 30 sec hold - Standing with Head Nod  - 1 x daily - 5 x weekly - 2-3 sets - 30 sec hold   PATIENT EDUCATION: Education  details: edu on gaze stabilization as a function of VOR; provided handout on vestibular neuritis; HEP update Person educated: Patient Education method: Explanation, Demonstration, Tactile cues, Verbal cues, and Handouts Education comprehension: verbalized understanding and returned demonstration     Below measures were taken at time of initial evaluation unless otherwise specified:   DIAGNOSTIC FINDINGS: 06/01/22 lumbar CT: Suspected left inferior foraminal disc protrusion at L2-3 with possible displacement of the left L2 nerve in the lateral extraforaminal space.Possible left lateral recess disc and foraminal disc  protrusion at L5-S1 which may impinge on the left L5 nerve roots although the appearance could be artifactual.   COGNITION: Overall cognitive status: Within functional limits for tasks assessed   SENSATION: WFL   POSTURE:  rounded shoulders and forward head; small dowager's hump; patient frequently switching positions/needing to lay down during session d/t R UE discomfort  Cervical ROM:    Active A/PROM (deg) eval  Flexion 34 *pain  Extension 34 *pain  Right lateral flexion 32 *pain  Left lateral flexion 15 *pain  Right rotation 53 *pain  Left rotation 51 *pain  (Blank rows = not tested)  *reports crepitus with L neck rotation    NECK PALPATION: no particular TTP; increased soft tissue restriction in R>L UT and LS ,cervical paraspinals   LUMBAR ROM:   Active  A/PROM  eval  Flexion Toes   Extension 25% limited; dizzy  Right lateral flexion Distal thigh  Left lateral flexion Distal thigh  Right rotation 25% limited  Left rotation 25% limited   (Blank rows = not tested)   BACK PALPATION: no TTP; increased soft tissue restriction in thoracic to lumbar paraspinals and R QL    UPPER EXTREMITY MMT:  MMT Right eval Left eval  Shoulder flexion 4 4+  Shoulder extension    Shoulder abduction 4 4  Shoulder adduction    Shoulder extension     Shoulder internal rotation 4+ 4+  Shoulder external rotation 4+ 4+  Middle trapezius    Lower trapezius    Elbow flexion 4 4  Elbow extension 4 4  Wrist flexion 4+ 4-  Wrist extension 4+ 4-  Wrist ulnar deviation    Wrist radial deviation    Wrist pronation    Wrist supination    Grip strength 35, 35, 35 lbs (35lbs avg) *R hand dominant 8, 4, 4 lbs (5.3 lb avg) *pt reports she "did something to my L elbow" recently    (Blank rows = not tested)    LOWER EXTREMITY MMT:   MMT (in sitting) Right eval Left eval  Hip flexion 5 5  Hip abduction 4+ 4+  Hip adduction 4+ 4+  Hip internal rotation    Hip external rotation    Knee flexion 4+ 4-  Knee extension 5 5  Ankle dorsiflexion 5 5  Ankle plantarflexion 4+ 4+  Ankle inversion    Ankle eversion    (Blank rows = not tested)    JOINT MOBILITY: TTP with gentle central PAs over sacrum and pressure over R sacral ALA; very TTP and poor tolerance of central PAs over thoracic spine   GAIT: Gait pattern:  L shoulder elevated, R shoulder depressed, narrow BOS Assistive device utilized: None Level of assistance: Complete Independence     PATIENT EDUCATION: Education details: prognosis, POC, exam findings, HEP Person educated: Patient Education method: Explanation, Demonstration, Tactile cues, Verbal cues, and Handouts Education comprehension: verbalized understanding  HOME EXERCISE PROGRAM: Access Code: [email or web address] URL: [email or web address] Date: 06/22/2022 Prepared by: [facility]  Exercises - Seated Cervical Retraction  - 1 x daily - 5 x weekly - 2 sets - 10 reps - 3 sec hold - Seated Thoracic Lumbar Extension  - 1 x daily - 5 x weekly - 2 sets - 10 reps - Supine Shoulder Horizontal Abduction with Resistance  - 1 x daily - 5 x weekly - 2 sets - 10 reps - Prone on Elbows Stretch  - 1 x daily - 5 x weekly -  2 sets - 10 reps   GOALS: Goals reviewed with patient?  Yes  SHORT TERM GOALS: Target date: 07/13/2022  Patient to be independent with initial HEP. Baseline: HEP initiated Goal status: IN PROGRESS    LONG TERM GOALS: Target date: 08/03/2022  Patient to be independent with advanced HEP. Baseline: Not yet initiated  Goal status: IN PROGRESS  Patient to demonstrate cervical and lumbar AROM WFL and without pain limiting.  Baseline: see above Goal status: IN PROGRESS  Patient will report 0/10 dizziness with bed mobility.  Baseline: - Goal status: IN PROGRESS  Patient to demonstrate mild-moderate sway with M-CTSIB condition with eyes closed/foam surface in order to improve safety in environments with uneven surfaces and dim lighting. Baseline: NT Goal status: IN PROGRESS  Patient to score at least 20/24 on DGI in order to decrease risk of falls. Baseline: NT Goal status: IN PROGRESS  Patient to report resolution of R UE N/T.  Baseline: ongoing Goal status: IN PROGRESS     ASSESSMENT:  CLINICAL IMPRESSION:  Patient arrived to session with report of increased neck pain and notes that a cervical MRI was ordered. 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 Positional testing revealed possible R posterior canalithiasis, but mild. Patient's score of 19/24 on DGI indicates an increased risk of falls. Patient reported nausea at end of session and reported understanding of edu provided. No other complaints upon leaving.    OBJECTIVE IMPAIRMENTS: Abnormal gait, decreased activity tolerance, decreased balance, decreased ROM, decreased strength, dizziness, increased muscle spasms, impaired flexibility, improper body mechanics, postural dysfunction, and pain.   ACTIVITY LIMITATIONS: carrying, lifting, bending, sitting, standing, squatting, sleeping, stairs, transfers, bathing, toileting, dressing, reach over head,  hygiene/grooming, and caring for others  PARTICIPATION LIMITATIONS: meal prep, cleaning, laundry, driving, shopping, community activity, occupation, yard work, and church  PERSONAL FACTORS: Age, Fitness, Past/current experiences, Time since onset of injury/illness/exacerbation, and 3+ comorbidities: anxiety, asthma, R bunionectomy; RA  are also affecting patient's functional outcome.   REHAB POTENTIAL: Good  CLINICAL DECISION MAKING: Evolving/moderate complexity  EVALUATION COMPLEXITY: Moderate   PLAN:  PT FREQUENCY: 1-2x/week  PT DURATION: 6 weeks  PLANNED INTERVENTIONS: Therapeutic exercises, Therapeutic activity, Neuromuscular re-education, Balance training, Gait training, Patient/Family education, Self Care, Joint mobilization, Stair training, Vestibular training, Canalith repositioning, Aquatic Therapy, Dry Needling, Electrical stimulation, Cryotherapy, Moist heat, Taping, Traction, Manual therapy, and Re-evaluation  PLAN FOR NEXT SESSION: reassess HEP, progress thoracic and lumbar jt mobility, postural strengthening, gaze stabilization and habituation    Janene Harvey, PT, DPT 06/26/22 8:46 AM  [facility] at [facility] [address], [place], [place] [address] Phone # [phone] Fax # [phone]

## 2022-06-23 ENCOUNTER — Ambulatory Visit: Payer: 59 | Admitting: Physical Therapy

## 2022-06-23 ENCOUNTER — Encounter: Payer: 59 | Admitting: Physical Therapy

## 2022-06-25 DIAGNOSIS — M542 Cervicalgia: Secondary | ICD-10-CM | POA: Diagnosis not present

## 2022-06-25 DIAGNOSIS — M545 Low back pain, unspecified: Secondary | ICD-10-CM | POA: Diagnosis not present

## 2022-06-25 DIAGNOSIS — M5412 Radiculopathy, cervical region: Secondary | ICD-10-CM | POA: Diagnosis not present

## 2022-06-25 DIAGNOSIS — G894 Chronic pain syndrome: Secondary | ICD-10-CM | POA: Diagnosis not present

## 2022-06-26 ENCOUNTER — Encounter: Payer: Self-pay | Admitting: Physical Therapy

## 2022-06-26 ENCOUNTER — Ambulatory Visit: Payer: 59 | Attending: Internal Medicine | Admitting: Physical Therapy

## 2022-06-26 DIAGNOSIS — M542 Cervicalgia: Secondary | ICD-10-CM | POA: Diagnosis not present

## 2022-06-26 DIAGNOSIS — R2681 Unsteadiness on feet: Secondary | ICD-10-CM | POA: Diagnosis not present

## 2022-06-26 DIAGNOSIS — M5459 Other low back pain: Secondary | ICD-10-CM | POA: Diagnosis not present

## 2022-06-26 DIAGNOSIS — R293 Abnormal posture: Secondary | ICD-10-CM | POA: Insufficient documentation

## 2022-06-26 DIAGNOSIS — R42 Dizziness and giddiness: Secondary | ICD-10-CM | POA: Insufficient documentation

## 2022-06-27 ENCOUNTER — Ambulatory Visit (INDEPENDENT_AMBULATORY_CARE_PROVIDER_SITE_OTHER): Payer: 59

## 2022-06-27 DIAGNOSIS — J309 Allergic rhinitis, unspecified: Secondary | ICD-10-CM

## 2022-06-27 DIAGNOSIS — M545 Low back pain, unspecified: Secondary | ICD-10-CM | POA: Diagnosis not present

## 2022-06-27 DIAGNOSIS — I700 Atherosclerosis of aorta: Secondary | ICD-10-CM | POA: Diagnosis not present

## 2022-06-27 DIAGNOSIS — M2550 Pain in unspecified joint: Secondary | ICD-10-CM | POA: Diagnosis not present

## 2022-06-27 DIAGNOSIS — G8929 Other chronic pain: Secondary | ICD-10-CM | POA: Diagnosis not present

## 2022-06-27 DIAGNOSIS — R42 Dizziness and giddiness: Secondary | ICD-10-CM | POA: Diagnosis not present

## 2022-06-27 NOTE — Therapy (Incomplete)
OUTPATIENT PHYSICAL THERAPY NEURO TREATMENT     Patient Name: Anne Jefferson MRN: [id number] DOB:1975/11/28, 46 y.o., female Today's Date: 06/27/2022  END OF SESSION:     Past Medical History:  Diagnosis Date   Abnormal Pap smear    Anxiety    no meds   Asthma    mild, no issue for years no  inhaler use except in winter   Biliary colic    Blood type, Rh negative    Gallstones    H/O dysmenorrhea    Infertility, female    Nausea and vomiting    Pain    back and chest realted to gallbladder since november 2018   PONV (postoperative nausea and vomiting)    severe, needs heavy anti nausea meds   Spinal stenosis    Past Surgical History:  Procedure Laterality Date    c sections     x 2  3 births last was twins   BUNIONECTOMY Right    CHOLECYSTECTOMY N/A 11/22/2017   Procedure: [place];  Surgeon: Anne Skinner, MD;  Location: [facility];  Service: General;  Laterality: N/A;   LEEP  12/23/2009   WISDOM TOOTH EXTRACTION     Patient Active Problem List   Diagnosis Date Noted   Lightheadedness 02/03/2022   Anxiety 02/03/2022   Obesity (BMI 30-39.9) 02/03/2022   Abnormal TSH 02/03/2022   Cervical spondylosis with radiculopathy 02/02/2022   Rheumatoid arthritis ([place]) 02/02/2022   Adult stuttering 02/01/2022   History of nasal polyposis 11/17/2021   Acute cough 10/20/2021   Viral illness 10/20/2021   Moderate persistent asthma without complication 73/41/9379   Moderate persistent asthma with (acute) exacerbation 11/16/2020   Shooting pain 07/29/2020   Atopic dermatitis 04/01/2019   Perennial and seasonal allergic rhinitis 09/17/2018   Acute non-recurrent frontal sinusitis 09/17/2018   Infertility, female    Abnormal Pap smear    H/O dysmenorrhea    Blood type, Rh negative    METATARSALGIA 12/18/2007   BUNIONS, LEFT FOOT 12/18/2007   UNEQUAL LEG LENGTH 12/18/2007   SPRAIN&STRAIN OF UNSPECIFIED SITE OF KNEE&LEG  11/13/2007    PCP: Anne Huddle, MD  REFERRING PROVIDER: Kathalene Frames, MD  REFERRING DIAG: R42 (ICD-10-CM) - Dizziness and giddiness M54.50 (ICD-10-CM) - Low back pain, unspecified  THERAPY DIAG:  No diagnosis found.  ONSET DATE: October 2023  Rationale for Evaluation and Treatment: Rehabilitation  SUBJECTIVE:   SUBJECTIVE STATEMENT: Pain was really bad in the neck yesterday. [facility]- ordered a neck MRI. No other changes. Has a 5k coming up. Pt accompanied by: self  PERTINENT HISTORY: anxiety, asthma, R bunionectomy  PAIN:  Are you having pain? Yes: NPRS scale: 4/10 Pain location: base of spine Pain description: dull  Aggravating factors: in the AM Relieving factors: movement   PAIN:  Are you having pain? Yes: NPRS scale: 5/10 Pain location: neck Pain description: constant Aggravating factors: driving, sitting, standing Relieving factors: laying own    PRECAUTIONS: Fall  WEIGHT BEARING RESTRICTIONS: No  FALLS: Has patient fallen in last 6 months? 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   PATIENT GOALS: improve pain  OBJECTIVE:     TODAY'S TREATMENT: 06/28/22 Activity Comments  HOME EXERCISE PROGRAM Last updated: 06/26/22 Access Code: [facility] URL: [email or web address] Date: 06/26/2022 Prepared by: [facility]  Exercises - Seated Cervical Retraction  - 1 x daily - 5 x weekly - 2 sets - 10 reps - 3 sec hold - Seated Thoracic Lumbar Extension  - 1 x daily - 5 x weekly - 2 sets - 10 reps - Supine Shoulder Horizontal Abduction with Resistance  - 1 x daily - 5 x weekly - 2 sets - 10 reps - Prone on Elbows Stretch  - 1 x daily - 5 x weekly - 2 sets - 10 reps - Standing  with Head Rotation  - 1 x daily - 5 x weekly - 2-3 sets - 30 sec hold - Standing with Head Nod  - 1 x daily - 5 x weekly - 2-3 sets - 30 sec hold     Below measures were taken at time of initial evaluation unless otherwise specified:   DIAGNOSTIC FINDINGS: 06/01/22 lumbar CT: Suspected left inferior foraminal disc protrusion at L2-3 with possible displacement of the left L2 nerve in the lateral extraforaminal space.Possible left lateral recess disc and foraminal disc protrusion at L5-S1 which may impinge on the left L5 nerve roots although the appearance could be artifactual.   COGNITION: Overall cognitive status: Within functional limits for tasks assessed   SENSATION: WFL   POSTURE:  rounded shoulders and forward head; small dowager's hump; patient frequently switching positions/needing to lay down during session d/t R UE discomfort  Cervical ROM:    Active A/PROM (deg) eval  Flexion 34 *pain  Extension 34 *pain  Right lateral flexion 32 *pain  Left lateral flexion 15 *pain  Right rotation 53 *pain  Left rotation 51 *pain  (Blank rows = not tested)  *reports crepitus with L neck rotation    NECK PALPATION: no particular TTP; increased soft tissue restriction in R>L UT and LS ,cervical paraspinals   LUMBAR ROM:   Active  A/PROM  eval  Flexion Toes   Extension 25% limited; dizzy  Right lateral flexion Distal thigh  Left lateral flexion Distal thigh  Right rotation 25% limited  Left rotation 25% limited   (Blank rows = not tested)   BACK PALPATION: no TTP; increased soft tissue restriction in thoracic to lumbar paraspinals and R QL    UPPER EXTREMITY MMT:  MMT Right eval Left eval  Shoulder flexion 4 4+  Shoulder extension    Shoulder abduction 4 4  Shoulder adduction    Shoulder extension    Shoulder internal rotation 4+ 4+  Shoulder external rotation 4+ 4+  Middle trapezius    Lower trapezius    Elbow flexion 4 4  Elbow extension 4 4  Wrist  flexion 4+ 4-  Wrist extension 4+ 4-  Wrist ulnar deviation    Wrist radial deviation    Wrist pronation    Wrist supination    Grip strength 35, 35, 35 lbs (35lbs avg) *R hand dominant 8, 4, 4 lbs (5.3 lb avg) *pt reports she "did something to my L elbow" recently    (Blank rows = not tested)    LOWER EXTREMITY MMT:   MMT (in sitting) Right eval Left eval  Hip flexion 5 5  Hip abduction 4+ 4+  Hip adduction 4+ 4+  Hip internal rotation    Hip external rotation    Knee flexion 4+ 4-  Knee extension 5 5  Ankle dorsiflexion 5 5  Ankle plantarflexion 4+ 4+  Ankle inversion    Ankle eversion    (Blank rows = not tested)    JOINT MOBILITY: TTP with gentle central PAs over sacrum and pressure over R sacral ALA; very TTP and poor tolerance of central PAs over thoracic spine   GAIT: Gait pattern:  L shoulder elevated, R shoulder depressed, narrow BOS Assistive device utilized: None Level of assistance: Complete Independence     PATIENT EDUCATION: Education details: prognosis, POC, exam findings, HEP Person educated: Patient Education method: Explanation, Demonstration, Tactile cues, Verbal cues, and Handouts Education comprehension: verbalized understanding  HOME EXERCISE PROGRAM: Access Code: [email or web address] URL: [email or web address] Date: 06/22/2022 Prepared by: [facility]  Exercises - Seated Cervical Retraction  - 1 x daily - 5 x weekly - 2 sets - 10 reps - 3 sec hold - Seated Thoracic Lumbar Extension  - 1 x daily - 5 x weekly - 2 sets - 10 reps - Supine Shoulder Horizontal Abduction with Resistance  - 1 x daily - 5 x weekly - 2 sets - 10 reps - Prone on Elbows Stretch  - 1 x daily - 5 x weekly - 2 sets - 10 reps   GOALS: Goals reviewed with patient? Yes  SHORT TERM GOALS: Target date: 07/13/2022  Patient to be independent with initial HEP. Baseline: HEP initiated Goal status: IN PROGRESS    LONG TERM  GOALS: Target date: 08/03/2022  Patient to be independent with advanced HEP. Baseline: Not yet initiated  Goal status: IN PROGRESS  Patient to demonstrate cervical and lumbar AROM WFL and without pain limiting.  Baseline: see above Goal status: IN PROGRESS  Patient will report 0/10 dizziness with bed mobility.  Baseline: - Goal status: IN PROGRESS  Patient to demonstrate mild-moderate sway with M-CTSIB condition with eyes closed/foam surface in order to improve safety in environments with uneven surfaces and dim lighting. Baseline: NT Goal status: IN PROGRESS  Patient to score at least 20/24 on DGI in order to decrease risk of falls. Baseline: NT Goal status: IN PROGRESS  Patient to report resolution of R UE N/T.  Baseline: ongoing Goal status: IN PROGRESS     ASSESSMENT:  CLINICAL IMPRESSION:  Patient arrived to session with report of increased neck pain and notes that a cervical MRI was ordered. 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 Positional testing revealed possible R posterior canalithiasis, but mild. Patient's score of 19/24 on DGI indicates an increased risk of falls. Patient reported nausea at end of session and reported understanding of edu provided. No other complaints upon leaving.    OBJECTIVE IMPAIRMENTS: Abnormal gait, decreased activity tolerance, decreased balance, decreased ROM, decreased strength, dizziness, increased muscle spasms, impaired flexibility, improper body mechanics, postural dysfunction, and pain.   ACTIVITY LIMITATIONS: carrying, lifting, bending, sitting, standing, squatting, sleeping, stairs, transfers, bathing, toileting, dressing, reach over head, hygiene/grooming, and caring for others  PARTICIPATION LIMITATIONS: meal prep, cleaning, laundry, driving, shopping, community activity, occupation, yard work, and  church  PERSONAL FACTORS: Age, Fitness, Past/current experiences, Time since onset of injury/illness/exacerbation, and 3+ comorbidities: anxiety, asthma, R bunionectomy; RA  are also affecting patient's functional outcome.   REHAB POTENTIAL: Good  CLINICAL DECISION MAKING: Evolving/moderate complexity  EVALUATION COMPLEXITY: Moderate   PLAN:  PT FREQUENCY: 1-2x/week  PT DURATION: 6 weeks  PLANNED INTERVENTIONS: Therapeutic exercises, Therapeutic activity, Neuromuscular re-education, Balance training, Gait  training, Patient/Family education, Self Care, Joint mobilization, Stair training, Vestibular training, Canalith repositioning, Aquatic Therapy, Dry Needling, Electrical stimulation, Cryotherapy, Moist heat, Taping, Traction, Manual therapy, and Re-evaluation  PLAN FOR NEXT SESSION: reassess HEP, progress thoracic and lumbar jt mobility, postural strengthening, gaze stabilization and habituation    Janene Harvey, PT, DPT 06/27/22 11:07 AM  [facility] at [facility] [address] [place], [place] [address] Phone # [phone] Fax # [phone]

## 2022-06-28 ENCOUNTER — Ambulatory Visit: Payer: 59 | Admitting: Physical Therapy

## 2022-06-29 ENCOUNTER — Other Ambulatory Visit: Payer: Self-pay

## 2022-06-29 DIAGNOSIS — L603 Nail dystrophy: Secondary | ICD-10-CM

## 2022-06-30 ENCOUNTER — Encounter: Payer: 59 | Admitting: Physical Therapy

## 2022-07-05 ENCOUNTER — Encounter: Payer: Self-pay | Admitting: Allergy

## 2022-07-05 ENCOUNTER — Ambulatory Visit (INDEPENDENT_AMBULATORY_CARE_PROVIDER_SITE_OTHER): Payer: 59 | Admitting: Allergy

## 2022-07-05 VITALS — BP 134/88 | HR 92 | Temp 98.1°F | Resp 20

## 2022-07-05 DIAGNOSIS — J069 Acute upper respiratory infection, unspecified: Secondary | ICD-10-CM | POA: Diagnosis not present

## 2022-07-05 DIAGNOSIS — J3089 Other allergic rhinitis: Secondary | ICD-10-CM

## 2022-07-05 DIAGNOSIS — J4541 Moderate persistent asthma with (acute) exacerbation: Secondary | ICD-10-CM

## 2022-07-05 MED ORDER — BREZTRI AEROSPHERE 160-9-4.8 MCG/ACT IN AERO: 2.0000 | INHALATION_SPRAY | Freq: Two times a day (BID) | RESPIRATORY_TRACT | 1 refills | Status: DC

## 2022-07-05 MED ORDER — BUDESONIDE 0.5 MG/2ML IN SUSP: 0.5000 mg | Freq: Three times a day (TID) | RESPIRATORY_TRACT | 1 refills | Status: DC | PRN

## 2022-07-05 MED ORDER — AIRSUPRA 90-80 MCG/ACT IN AERO: 2.0000 | INHALATION_SPRAY | RESPIRATORY_TRACT | 1 refills | Status: DC | PRN

## 2022-07-05 MED ORDER — PREDNISONE 10 MG PO TABS: ORAL_TABLET | ORAL | 0 refills | Status: AC

## 2022-07-05 NOTE — Progress Notes (Signed)
Follow-up Note  RE: Anne Jefferson MRN: [id number] DOB: Aug 18, 1975 Date of Office Visit: 07/05/2022   History of present illness: Anne Jefferson is a 46 y.o. female presenting today for sick visit.  She has history of asthma and allergic rhinitis.   Her last visit was a telemedicine visit on 05/12/22 by Dr Posey Pronto.  She states at that time she had Covid.    She states generally her asthma is well managed with Breztri and her as needed albuterol.  She states her family is passing around a cold.  She states her symptoms are not clearing with her medications like usual.  She has having shortness of breath, coughing, wheezing, trouble talking at times due to shortness of breath.  Denies fevers, chills or night sweats.  No significant nasal symptoms.  She states she usually gets better with prednisone.   She states during the night she used albuterol 5 times.  Today she had used 5 times already.  She does use with spacer.   She stays on prednisone for her RA and states she is currently on 10.  She states she started at '50mg'$  when she had Covid and was weaning down there.   She states her son has similar symptoms as she does and he tested negative for covid this morning.    Review of systems: Review of Systems  Constitutional: Negative.   HENT: Negative.    Eyes: Negative.   Respiratory:         See HPI  Cardiovascular: Negative.   Gastrointestinal: Negative.   Musculoskeletal: Negative.   Skin: Negative.   Allergic/Immunologic: Negative.   Neurological: Negative.      All other systems negative unless noted above in HPI  Past medical/social/surgical/family history have been reviewed and are unchanged unless specifically indicated below.  No changes  Medication List: Current Outpatient Medications  Medication Sig Dispense Refill   albuterol (PROVENTIL HFA) 108 (90 Base) MCG/ACT inhaler Inhale 2 puffs into the lungs every 4 (four) hours as needed for wheezing or  shortness of breath. 3 each 0   albuterol (PROVENTIL) (2.5 MG/3ML) 0.083% nebulizer solution Use 1 unit dose via the nebulizer every 6 hours as needed for cough, wheeze, tightness in chest, or shortness of breath. 75 mL 3   Albuterol-Budesonide (AIRSUPRA) 90-80 MCG/ACT AERO Inhale 2 puffs into the lungs every 4 (four) hours as needed (cough, wheeze, shortness of breath). 32.1 g 1   budesonide (PULMICORT) 0.5 MG/2ML nebulizer solution Take 2 mLs (0.5 mg total) by nebulization 3 (three) times daily as needed (asthma flare). 180 mL 1   celecoxib (CELEBREX) 100 MG capsule Take 100 mg by mouth 2 (two) times daily.     dicyclomine (BENTYL) 20 MG tablet Take 1 tablet (20 mg total) by mouth 2 (two) times daily as needed (abdominal spasms). 20 tablet 0   docusate sodium (COLACE) 100 MG capsule Take 1 capsule (100 mg total) by mouth every 12 (twelve) hours. 60 capsule 0   DULoxetine (CYMBALTA) 30 MG capsule Take 90 mg by mouth daily.     EPINEPHrine (AUVI-Q) 0.3 mg/0.3 mL IJ SOAJ injection Inject 0.3 mg into the muscle as needed for anaphylaxis. 2 each 1   fexofenadine (ALLEGRA) 180 MG tablet Take 180 mg by mouth 2 (two) times daily.     folic acid (FOLVITE) 1 MG tablet Take 1 mg by mouth daily. Everyday but Thursdays     gabapentin (NEURONTIN) 300 MG capsule Take 300 mg by mouth 3 (three)  times daily.     hydroxychloroquine (PLAQUENIL) 200 MG tablet Take 200 mg by mouth 2 (two) times daily.     methotrexate (RHEUMATREX) 2.5 MG tablet Take 10 mg by mouth once a week. Caution:Chemotherapy. Protect from light. Takes on Thursdays     mometasone (NASONEX) 50 MCG/ACT nasal spray Place 1 spray into the nose daily. (Patient taking differently: Place 1 spray into the nose 2 (two) times daily.) 1 each 3   omeprazole (PRILOSEC) 40 MG capsule Take 40 mg by mouth daily.     predniSONE (DELTASONE) 10 MG tablet Take 2 tablets (20 mg total) by mouth 2 (two) times daily with a meal for 7 days, THEN 3 tablets (30 mg total)  daily with breakfast for 7 days, THEN 2 tablets (20 mg total) daily with breakfast for 7 days. 63 tablet 0   predniSONE (DELTASONE) 20 MG tablet Taper as prescribed by rheumatology     predniSONE (DELTASONE) 5 MG tablet Take 10 mg by mouth daily.     traMADol (ULTRAM) 50 MG tablet Take 100 mg by mouth 1 day or 1 dose.     UNABLE TO FIND 2 (two) times a week. Med Name: ALLERGY SHOTS     VITAMIN D PO Take by mouth daily.     BREZTRI AEROSPHERE 160-9-4.8 MCG/ACT AERO Inhale 2 puffs into the lungs in the morning and at bedtime. 32.1 g 1   No current facility-administered medications for this visit.     Known medication allergies: Allergies  Allergen Reactions   Promethazine Other (See Comments)    Hallucinations   Theophyllines Nausea And Vomiting   Mite (D. Farinae) Other (See Comments)    Cats, dogs, dust    Molds & Smuts Other (See Comments)     Physical examination: Blood pressure 134/88, pulse 92, temperature 98.1 F (36.7 C), temperature source Temporal, resp. rate 20, SpO2 95 %.  General: Alert, interactive, in no acute distress. Neck: Supple without lymphadenopathy. Lungs: Mildly decreased breath sounds bilaterally without wheezing, rhonchi or rales. {increased work of breathing. CV: Normal S1, S2 without murmurs. Abdomen: Nondistended, nontender. Skin: Warm and dry, without lesions or rashes. Extremities:  No clubbing, cyanosis or edema. Neuro:   Grossly intact.  Diagnositics/Labs: Duoneb provided in office due to increased WOB.     Breathing did improved following DuoNeb with clearer breath sounds and able to talk in a complete sentence.  Assessment and plan: Asthma with exacerbation Allergic rhinitis Viral URI  - Continue Breztri 2 puffs twice a day with a spacer to prevent cough or wheeze - Use AirSupra 2 puffs every 4-6 hours as needed (do not exceed more than 12 puffs/day)  - Can use albuterol vial mixed with budesonide vial 2-3 times a day for 1-2 weeks  until symptoms have improved (if not using AirSupra -- this has same medications). - Increase Prednisone to '20mg'$  twice a day for next 7 days, then '30mg'$  daily for 7 days, then '20mg'$  daily for 7 days, then '10mg'$  daily and follow rheumatologist recommendations for further weaning past '10mg'$ .   -If not using AirSupra can continue albuterol 2 puffs once every 4 hours as needed for cough or wheeze or prior to activity.  -Advised her to start doing flutter valve device to help clear her airway for mucus.  She will buy the Acapella device off of [place].  Advised which inhaler to use when and how often and max dosing of the above: Airsupra, budesonide vials and albuterol inhaler  Asthma  control goals:  Full participation in all desired activities (may need albuterol before activity) Albuterol use two time or less a week on average (not counting use with activity) Cough interfering with sleep two time or less a month Oral steroids no more than once a year No hospitalizations  - Perform nasal saline rinses before nose sprays. Use distilled water.   - Continue Nasonex/Flonase 1 sprays each nostril twice daily for nasal congestion control.  Aim upward and outward. - Use Azelastine 1-2 sprays each nostril twice daily for nasal drainage control. Aim upward and outward. - Use Allegra '180mg'$  daily.  - Use Mucinex as needed to help move mucus up and out of lungs  Follow-up in 2-3 months   I appreciate the opportunity to take part in Anne Jefferson's care. Please do not hesitate to contact me with questions.  Sincerely,   Prudy Feeler, MD Allergy/Immunology [facility]

## 2022-07-05 NOTE — Patient Instructions (Addendum)
Asthma with exacerbation Allergic rhinitis Viral URI  - Continue Breztri 2 puffs twice a day with a spacer to prevent cough or wheeze - Use AirSupra 2 puffs every 4-6 hours as needed (do not exceed more than 12 puffs/day) - Can use albuterol vial mixed with budesonide vial 2-3 times a day for 1-2 weeks until symptoms have improved - Increase Prednisone to '20mg'$  twice a day for next 7 days, then '30mg'$  daily for 7 days, then '20mg'$  daily for 7 days, then '10mg'$  daily and follow rheumatologist recommendations for further weaning past '10mg'$ .   - Continue albuterol 2 puffs once every 4 hours as needed for cough or wheeze or prior to activity.   Asthma control goals:  Full participation in all desired activities (may need albuterol before activity) Albuterol use two time or less a week on average (not counting use with activity) Cough interfering with sleep two time or less a month Oral steroids no more than once a year No hospitalizations  - Perform nasal saline rinses before nose sprays. Use distilled water.   - Continue Nasonex/Flonase 1 sprays each nostril twice daily for nasal congestion control.  Aim upward and outward. - Use Azelastine 1-2 sprays each nostril twice daily for nasal drainage control. Aim upward and outward. - Use Allegra '180mg'$  daily.  - Use Mucinex as needed to help move mucus up and out of lungs  Follow-up in 2-3 months

## 2022-07-07 ENCOUNTER — Telehealth: Payer: Self-pay | Admitting: Allergy

## 2022-07-07 ENCOUNTER — Ambulatory Visit (INDEPENDENT_AMBULATORY_CARE_PROVIDER_SITE_OTHER): Payer: 59

## 2022-07-07 DIAGNOSIS — J309 Allergic rhinitis, unspecified: Secondary | ICD-10-CM

## 2022-07-07 DIAGNOSIS — J4541 Moderate persistent asthma with (acute) exacerbation: Secondary | ICD-10-CM | POA: Diagnosis not present

## 2022-07-07 DIAGNOSIS — M2550 Pain in unspecified joint: Secondary | ICD-10-CM | POA: Diagnosis not present

## 2022-07-07 NOTE — Telephone Encounter (Signed)
Patient states she told Dr. Nelva Bush she did not need a nebulizer as she shares one with her son. However, they are both having asthma flares and will be in two separate places and is now in need of a nebulizer.   Patient is requesting nebulizer either be called in to pharmacy or she states she can come by the [place] office office to pick one up.   Best contact number: [phone]   [facility] [place] [address]

## 2022-07-07 NOTE — Telephone Encounter (Signed)
A nebulizer has been placed up front for the patient to pick up in [address]. Patient spoke with Angela Nevin and has been made aware.

## 2022-07-09 DIAGNOSIS — M542 Cervicalgia: Secondary | ICD-10-CM | POA: Diagnosis not present

## 2022-07-12 DIAGNOSIS — H43823 Vitreomacular adhesion, bilateral: Secondary | ICD-10-CM | POA: Diagnosis not present

## 2022-07-12 DIAGNOSIS — Z79899 Other long term (current) drug therapy: Secondary | ICD-10-CM | POA: Diagnosis not present

## 2022-07-12 DIAGNOSIS — R768 Other specified abnormal immunological findings in serum: Secondary | ICD-10-CM | POA: Diagnosis not present

## 2022-07-12 DIAGNOSIS — H01021 Squamous blepharitis right upper eyelid: Secondary | ICD-10-CM | POA: Diagnosis not present

## 2022-07-12 DIAGNOSIS — H01025 Squamous blepharitis left lower eyelid: Secondary | ICD-10-CM | POA: Diagnosis not present

## 2022-07-12 DIAGNOSIS — Z83511 Family history of glaucoma: Secondary | ICD-10-CM | POA: Diagnosis not present

## 2022-07-12 DIAGNOSIS — L03116 Cellulitis of left lower limb: Secondary | ICD-10-CM | POA: Diagnosis not present

## 2022-07-12 DIAGNOSIS — H5022 Vertical strabismus, left eye: Secondary | ICD-10-CM | POA: Diagnosis not present

## 2022-07-12 DIAGNOSIS — R42 Dizziness and giddiness: Secondary | ICD-10-CM | POA: Diagnosis not present

## 2022-07-12 DIAGNOSIS — H1045 Other chronic allergic conjunctivitis: Secondary | ICD-10-CM | POA: Diagnosis not present

## 2022-07-12 DIAGNOSIS — M2550 Pain in unspecified joint: Secondary | ICD-10-CM | POA: Diagnosis not present

## 2022-07-13 NOTE — Therapy (Signed)
OUTPATIENT PHYSICAL THERAPY NEURO TREATMENT     Patient Name: Anne Jefferson MRN: [id number] DOB:[birth year], 46 y.o., female Today's Date: 07/14/2022  END OF SESSION:  PT End of Session - 07/14/22 0846     Visit Number 3    Number of Visits 13    Date for PT Re-Evaluation 08/03/22    Authorization Type [employer]    PT Start Time 0800    PT Stop Time 0842    PT Time Calculation (min) 42 min    Equipment Utilized During Treatment Gait belt    Activity Tolerance Patient tolerated treatment well;Patient limited by pain    Behavior During Therapy WFL for tasks assessed/performed               Past Medical History:  Diagnosis Date   Abnormal Pap smear    Anxiety    no meds   Asthma    mild, no issue for years no  inhaler use except in winter   Biliary colic    Blood type, Rh negative    Gallstones    H/O dysmenorrhea    Infertility, female    Nausea and vomiting    Pain    back and chest realted to gallbladder since november 2018   PONV (postoperative nausea and vomiting)    severe, needs heavy anti nausea meds   Spinal stenosis    Past Surgical History:  Procedure Laterality Date    c sections     x 2  3 births last was twins   BUNIONECTOMY Right    CHOLECYSTECTOMY N/A 11/22/2017   Procedure: [place];  Surgeon: Mickeal Skinner, MD;  Location: [facility];  Service: General;  Laterality: N/A;   LEEP  12/23/2009   WISDOM TOOTH EXTRACTION     Patient Active Problem List   Diagnosis Date Noted   Lightheadedness 02/03/2022   Anxiety 02/03/2022   Obesity (BMI 30-39.9) 02/03/2022   Abnormal TSH 02/03/2022   Cervical spondylosis with radiculopathy 02/02/2022   Rheumatoid arthritis ([place]) 02/02/2022   Adult stuttering 02/01/2022   History of nasal polyposis 11/17/2021   Acute cough 10/20/2021   Viral illness 10/20/2021   Moderate persistent asthma without complication 93/23/5573   Moderate persistent asthma with (acute)  exacerbation 11/16/2020   Shooting pain 07/29/2020   Atopic dermatitis 04/01/2019   Perennial and seasonal allergic rhinitis 09/17/2018   Acute non-recurrent frontal sinusitis 09/17/2018   Infertility, female    Abnormal Pap smear    H/O dysmenorrhea    Blood type, Rh negative    METATARSALGIA 12/18/2007   BUNIONS, LEFT FOOT 12/18/2007   UNEQUAL LEG LENGTH 12/18/2007   SPRAIN&STRAIN OF UNSPECIFIED SITE OF KNEE&LEG 11/13/2007    PCP: Josetta Huddle, MD  REFERRING PROVIDER: Kathalene Frames, MD  REFERRING DIAG: R42 (ICD-10-CM) - Dizziness and giddiness M54.50 (ICD-10-CM) - Low back pain, unspecified  THERAPY DIAG:  Cervicalgia  Other low back pain  Abnormal posture  Dizziness and giddiness  Unsteadiness on feet  ONSET DATE: October 2023  Rationale for Evaluation and Treatment: Rehabilitation  SUBJECTIVE:   SUBJECTIVE STATEMENT: I've been having trouble with my asthma. Has been seeing her Dr. For this and it is not getting better. [place] a cut 2 weeks ago [place]ampoline park on her L ankle which is infected and now on meds for this. Pain is improved d/t being on Prednisone for her asthma. Got dizzy yesterday and threw up at an eye appointment- not sure what caused it.  Pt accompanied by: self  PERTINENT HISTORY: anxiety, asthma, R bunionectomy  PAIN:  Are you having pain? Yes: NPRS scale: 1/10 Pain location: base of spine Pain description: dull  Aggravating factors: in the AM Relieving factors: movement   PAIN:  Are you having pain? Yes: NPRS scale: 1/10 Pain location: neck Pain description: constant Aggravating factors: driving, sitting, standing Relieving factors: laying own    PRECAUTIONS: Fall  WEIGHT BEARING RESTRICTIONS: No  FALLS: Has patient fallen in last 6 months? 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   PATIENT GOALS: improve pain  OBJECTIVE:     TODAY'S TREATMENT: 07/14/22 Activity Comments  cervical retraction  10x, with yellow TB 10x Using 2 fingers to guide motion; unable to tolerate with TB d/t c/o R UE pain- discontinued   sitting thoracic extension over chair sitting on airex 10x *pt notes this is relieving at home however reports nausea today and unable to complete full set; arms across chest   supine horizontal ABD yellow TB 10x   Good tolerance at limited ROM  Supine B shoulder ER with yellow TB 2x10 Assisted in correcting alignment; limited ROM  Supine cervical retraction into towel roll 10x3"  "That feels good" however then reported pain in R arm after several reps   prone on elbows 10x3" Good tolerance, limited mobility   Thread the needle with hand behind head 5x each side  Trialed with hand behind head which was painful; better tolerance for arms outstretched   standing head turns to targets  30" 3-4/10 nausea; slow   standing head nods to targets 30"  2/10 nausea; slow       HOME EXERCISE PROGRAM Last updated: 07/14/22 Access Code: [facility] URL: [email or web address] Date: 07/14/2022 Prepared by: [facility]  Exercises - Seated Thoracic Lumbar Extension  - 1 x daily - 5 x weekly - 2 sets - 10 reps - Supine Shoulder Horizontal Abduction with Resistance  - 1 x daily - 5 x weekly - 2 sets - 10 reps - Supine Shoulder External Rotation with Resistance  - 1 x daily - 5 x weekly - 2 sets - 10 reps - Supine Cervical Retraction with Towel  - 1 x daily - 5 x weekly - 2 sets - 10 reps - 3 sec  hold - Sidelying Thoracic Rotation with Open Book  - 1 x daily - 5 x weekly - 2 sets - 5 reps - Prone on Elbows Stretch  - 1 x daily - 5 x weekly - 2 sets - 10 reps - Standing with Head Nod  - 1 x daily - 5 x weekly - 2-3 sets - 30 sec hold - Standing with Head Rotation  - 1 x daily -  5 x weekly - 2-3 sets - 30 sec hold   PATIENT EDUCATION: Education details: HEP update- advised to stop if pain occurs Person educated: Patient Education method: Explanation, Demonstration, Tactile cues, Verbal cues, and Handouts Education comprehension: verbalized understanding and returned demonstration    Below measures were taken at time of initial evaluation unless otherwise specified:   DIAGNOSTIC FINDINGS: 06/01/22 lumbar CT: Suspected left inferior foraminal disc protrusion at L2-3 with possible displacement of the left L2 nerve in the lateral extraforaminal space.Possible left lateral recess disc and foraminal disc protrusion at L5-S1  which may impinge on the left L5 nerve roots although the appearance could be artifactual.   COGNITION: Overall cognitive status: Within functional limits for tasks assessed   SENSATION: WFL   POSTURE:  rounded shoulders and forward head; small dowager's hump; patient frequently switching positions/needing to lay down during session d/t R UE discomfort  Cervical ROM:    Active A/PROM (deg) eval  Flexion 34 *pain  Extension 34 *pain  Right lateral flexion 32 *pain  Left lateral flexion 15 *pain  Right rotation 53 *pain  Left rotation 51 *pain  (Blank rows = not tested)  *reports crepitus with L neck rotation    NECK PALPATION: no particular TTP; increased soft tissue restriction in R>L UT and LS ,cervical paraspinals   LUMBAR ROM:   Active  A/PROM  eval  Flexion Toes   Extension 25% limited; dizzy  Right lateral flexion Distal thigh  Left lateral flexion Distal thigh  Right rotation 25% limited  Left rotation 25% limited   (Blank rows = not tested)   BACK PALPATION: no TTP; increased soft tissue restriction in thoracic to lumbar paraspinals and R QL    UPPER EXTREMITY MMT:  MMT Right eval Left eval  Shoulder flexion 4 4+  Shoulder extension    Shoulder abduction 4 4  Shoulder adduction    Shoulder  extension    Shoulder internal rotation 4+ 4+  Shoulder external rotation 4+ 4+  Middle trapezius    Lower trapezius    Elbow flexion 4 4  Elbow extension 4 4  Wrist flexion 4+ 4-  Wrist extension 4+ 4-  Wrist ulnar deviation    Wrist radial deviation    Wrist pronation    Wrist supination    Grip strength 35, 35, 35 lbs (35lbs avg) *R hand dominant 8, 4, 4 lbs (5.3 lb avg) *pt reports she "did something to my L elbow" recently    (Blank rows = not tested)    LOWER EXTREMITY MMT:   MMT (in sitting) Right eval Left eval  Hip flexion 5 5  Hip abduction 4+ 4+  Hip adduction 4+ 4+  Hip internal rotation    Hip external rotation    Knee flexion 4+ 4-  Knee extension 5 5  Ankle dorsiflexion 5 5  Ankle plantarflexion 4+ 4+  Ankle inversion    Ankle eversion    (Blank rows = not tested)    JOINT MOBILITY: TTP with gentle central PAs over sacrum and pressure over R sacral ALA; very TTP and poor tolerance of central PAs over thoracic spine   GAIT: Gait pattern:  L shoulder elevated, R shoulder depressed, narrow BOS Assistive device utilized: None Level of assistance: Complete Independence     PATIENT EDUCATION: Education details: prognosis, POC, exam findings, HEP Person educated: Patient Education method: Explanation, Demonstration, Tactile cues, Verbal cues, and Handouts Education comprehension: verbalized understanding  HOME EXERCISE PROGRAM: Access Code: [email or web address] URL: [email or web address] Date: 06/22/2022 Prepared by: [facility]  Exercises - Seated Cervical Retraction  - 1 x daily - 5 x weekly - 2 sets - 10 reps - 3 sec hold - Seated Thoracic Lumbar Extension  - 1 x daily - 5 x weekly - 2 sets - 10 reps - Supine Shoulder Horizontal Abduction with Resistance  - 1 x daily - 5 x weekly - 2 sets - 10 reps - Prone on Elbows Stretch  - 1 x daily - 5 x weekly - 2 sets -  10 reps   GOALS: Goals reviewed with  patient? Yes  SHORT TERM GOALS: Target date: 07/13/2022  Patient to be independent with initial HEP. Baseline: HEP initiated Goal status: MET    LONG TERM GOALS: Target date: 08/03/2022  Patient to be independent with advanced HEP. Baseline: Not yet initiated  Goal status: IN PROGRESS  Patient to demonstrate cervical and lumbar AROM WFL and without pain limiting.  Baseline: see above Goal status: IN PROGRESS  Patient will report 0/10 dizziness with bed mobility.  Baseline: - Goal status: IN PROGRESS  Patient to demonstrate mild-moderate sway with M-CTSIB condition with eyes closed/foam surface in order to improve safety in environments with uneven surfaces and dim lighting. Baseline: NT Goal status: IN PROGRESS  Patient to score at least 20/24 on DGI in order to decrease risk of falls. Baseline: NT Goal status: IN PROGRESS  Patient to report resolution of R UE N/T.  Baseline: ongoing Goal status: IN PROGRESS     ASSESSMENT:  CLINICAL IMPRESSION:  Patient arrived to session with report of exacerbation of her asthma which she is on Prednisone for. Reports that neck and back pain has been improved d/t being on this steroid. Also reports that she sustained a cut on the L ankle while at the trampoline park that is infected- currently undergoing treatment for this as well. Reviewed HEP and provided cueing as needed for form and max tolerance. Patient limited by pain and nausea with cervical and thoracic mobility exercises despite providing modifications. Able to progress periscapular strengthening with good tolerance. Gaze stabilization activities still bring on mild nausea. Updates were made to HEP according to tolerance today. Patient reported understanding and without complaints upon leaving.    OBJECTIVE IMPAIRMENTS: Abnormal gait, decreased activity tolerance, decreased balance, decreased ROM, decreased strength, dizziness, increased muscle spasms, impaired flexibility,  improper body mechanics, postural dysfunction, and pain.   ACTIVITY LIMITATIONS: carrying, lifting, bending, sitting, standing, squatting, sleeping, stairs, transfers, bathing, toileting, dressing, reach over head, hygiene/grooming, and caring for others  PARTICIPATION LIMITATIONS: meal prep, cleaning, laundry, driving, shopping, community activity, occupation, yard work, and church  PERSONAL FACTORS: Age, Fitness, Past/current experiences, Time since onset of injury/illness/exacerbation, and 3+ comorbidities: anxiety, asthma, R bunionectomy; RA  are also affecting patient's functional outcome.   REHAB POTENTIAL: Good  CLINICAL DECISION MAKING: Evolving/moderate complexity  EVALUATION COMPLEXITY: Moderate   PLAN:  PT FREQUENCY: 1-2x/week  PT DURATION: 6 weeks  PLANNED INTERVENTIONS: Therapeutic exercises, Therapeutic activity, Neuromuscular re-education, Balance training, Gait training, Patient/Family education, Self Care, Joint mobilization, Stair training, Vestibular training, Canalith repositioning, Aquatic Therapy, Dry Needling, Electrical stimulation, Cryotherapy, Moist heat, Taping, Traction, Manual therapy, and Re-evaluation  PLAN FOR NEXT SESSION:  progress thoracic and lumbar jt mobility, postural strengthening, gaze stabilization and habituation    Janene Harvey, PT, DPT 07/14/22 8:47 AM  [facility] at [facility] [address], [address] [place],  [address] Phone # [phone] Fax # [phone]

## 2022-07-14 ENCOUNTER — Encounter: Payer: Self-pay | Admitting: Physical Therapy

## 2022-07-14 ENCOUNTER — Ambulatory Visit (INDEPENDENT_AMBULATORY_CARE_PROVIDER_SITE_OTHER): Payer: 59 | Admitting: *Deleted

## 2022-07-14 ENCOUNTER — Ambulatory Visit: Payer: 59 | Admitting: Physical Therapy

## 2022-07-14 DIAGNOSIS — M542 Cervicalgia: Secondary | ICD-10-CM | POA: Diagnosis not present

## 2022-07-14 DIAGNOSIS — R2681 Unsteadiness on feet: Secondary | ICD-10-CM

## 2022-07-14 DIAGNOSIS — M5459 Other low back pain: Secondary | ICD-10-CM | POA: Diagnosis not present

## 2022-07-14 DIAGNOSIS — J309 Allergic rhinitis, unspecified: Secondary | ICD-10-CM | POA: Diagnosis not present

## 2022-07-14 DIAGNOSIS — R42 Dizziness and giddiness: Secondary | ICD-10-CM | POA: Diagnosis not present

## 2022-07-14 DIAGNOSIS — R293 Abnormal posture: Secondary | ICD-10-CM | POA: Diagnosis not present

## 2022-07-14 NOTE — Therapy (Incomplete)
OUTPATIENT PHYSICAL THERAPY NEURO TREATMENT     Patient Name: Anne Jefferson MRN: [id number] DOB:[birth year], 46 y.o., female Today's Date: 07/18/2022  END OF SESSION:  PT End of Session - 07/18/22 0830     Visit Number 4    Number of Visits 13    Date for PT Re-Evaluation 08/03/22    Authorization Type [employer]    PT Start Time 0803    PT Stop Time 0830    PT Time Calculation (min) 27 min    Activity Tolerance Patient limited by pain;Other (comment)   dizziness   Behavior During Therapy [facility] for tasks assessed/performed                Past Medical History:  Diagnosis Date   Abnormal Pap smear    Anxiety    no meds   Asthma    mild, no issue for years no  inhaler use except in winter   Biliary colic    Blood type, Rh negative    Gallstones    H/O dysmenorrhea    Infertility, female    Nausea and vomiting    Pain    back and chest realted to gallbladder since november 2018   PONV (postoperative nausea and vomiting)    severe, needs heavy anti nausea meds   Spinal stenosis    Past Surgical History:  Procedure Laterality Date    c sections     x 2  3 births last was twins   BUNIONECTOMY Right    CHOLECYSTECTOMY N/A 11/22/2017   Procedure: [place];  Surgeon: Mickeal Skinner, MD;  Location: [facility];  Service: General;  Laterality: N/A;   LEEP  12/23/2009   WISDOM TOOTH EXTRACTION     Patient Active Problem List   Diagnosis Date Noted   Lightheadedness 02/03/2022   Anxiety 02/03/2022   Obesity (BMI 30-39.9) 02/03/2022   Abnormal TSH 02/03/2022   Cervical spondylosis with radiculopathy 02/02/2022   Rheumatoid arthritis ([place]) 02/02/2022   Adult stuttering 02/01/2022   History of nasal polyposis 11/17/2021   Acute cough 10/20/2021   Viral illness 10/20/2021   Moderate persistent asthma without complication 78/93/8101   Moderate persistent asthma with (acute) exacerbation 11/16/2020   Shooting pain 07/29/2020    Atopic dermatitis 04/01/2019   Perennial and seasonal allergic rhinitis 09/17/2018   Acute non-recurrent frontal sinusitis 09/17/2018   Infertility, female    Abnormal Pap smear    H/O dysmenorrhea    Blood type, Rh negative    METATARSALGIA 12/18/2007   BUNIONS, LEFT FOOT 12/18/2007   UNEQUAL LEG LENGTH 12/18/2007   SPRAIN&STRAIN OF UNSPECIFIED SITE OF KNEE&LEG 11/13/2007    PCP: Josetta Huddle, MD  REFERRING PROVIDER: Kathalene Frames, MD  REFERRING DIAG: R42 (ICD-10-CM) - Dizziness and giddiness M54.50 (ICD-10-CM) - Low back pain, unspecified  THERAPY DIAG:  Cervicalgia  Other low back pain  Abnormal posture  Dizziness and giddiness  Unsteadiness on feet  ONSET DATE: October 2023  Rationale for Evaluation and Treatment: Rehabilitation  SUBJECTIVE:   SUBJECTIVE STATEMENT: Would like to discuss her cervical imaging. Meets with the spine specialist tomorrow at [facility] and they are talking about surgery. Dizziness has been worse this this week- had more trouble completing a walk yesterday. Now on Doxycycline d/t a new infection on her face.  Pt accompanied by: self  PERTINENT HISTORY: anxiety, asthma, R bunionectomy   PAIN:  Are you having pain? Yes: NPRS scale: 4/10 Pain location: upper back and R  arm Pain description: constant Aggravating factors: driving, sitting, standing Relieving factors: laying own    PRECAUTIONS: Fall  WEIGHT BEARING RESTRICTIONS: No  FALLS: Has patient fallen in last 6 months? 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   PATIENT GOALS: improve pain  OBJECTIVE:      TODAY'S TREATMENT: 07/18/22 Activity Comments  Red TB row 2x10 Cueing for form and posture; tendency for excessive posterior lean; cues to avoid excessive shoulder extension   Red  TB extension 2x10 Cueing for form and posture; tendency for excessive posterior lean  paloff press with red TB 10x each side C/o dizziness; cues for wider BOS and controlled motion   Pelvic tilts 10x Pt requesting not to try cat/cow today d/t dizziness    HOME EXERCISE PROGRAM Last updated: 07/14/22 Access Code: [facility] URL: [email or web address] Date: 07/14/2022 Prepared by: [facility]  Exercises - Seated Thoracic Lumbar Extension  - 1 x daily - 5 x weekly - 2 sets - 10 reps - Supine Shoulder Horizontal Abduction with Resistance  - 1 x daily - 5 x weekly - 2 sets - 10 reps - Supine Shoulder External Rotation with Resistance  - 1 x daily - 5 x weekly - 2 sets - 10 reps - Supine Cervical Retraction with Towel  - 1 x daily - 5 x weekly - 2 sets - 10 reps - 3 sec  hold - Sidelying Thoracic Rotation with Open Book  - 1 x daily - 5 x weekly - 2 sets - 5 reps - Prone on Elbows Stretch  - 1 x daily - 5 x weekly - 2 sets - 10 reps - Standing with Head Nod  - 1 x daily - 5 x weekly - 2-3 sets - 30 sec hold - Standing with Head Rotation  - 1 x daily - 5 x weekly - 2-3 sets - 30 sec hold    Below measures were taken at time of initial evaluation unless otherwise specified:   DIAGNOSTIC FINDINGS: 06/01/22 lumbar CT: Suspected left inferior foraminal disc protrusion at L2-3 with possible displacement of the left L2 nerve in the lateral extraforaminal space.Possible left lateral recess disc and foraminal disc protrusion at L5-S1 which may impinge on the left L5 nerve roots although the appearance could be artifactual.  07/09/22 cervical MRI from patient's records on her phone: moderate spondylosis with thecal sac stenosis C3-4 and C5-7, moderate stenosis    COGNITION: Overall cognitive status: Within functional limits for tasks assessed   SENSATION: WFL   POSTURE:  rounded shoulders and forward head; small dowager's hump; patient  frequently switching positions/needing to lay down during session d/t R UE discomfort  Cervical ROM:    Active A/PROM (deg) eval  Flexion 34 *pain  Extension 34 *pain  Right lateral flexion 32 *pain  Left lateral flexion 15 *pain  Right rotation 53 *pain  Left rotation 51 *pain  (Blank rows = not tested)  *reports crepitus with L neck rotation    NECK PALPATION: no particular TTP; increased soft tissue restriction in R>L UT and LS ,cervical paraspinals   LUMBAR ROM:   Active  A/PROM  eval  Flexion Toes   Extension 25% limited; dizzy  Right lateral flexion Distal thigh  Left lateral flexion Distal thigh  Right rotation 25% limited  Left  rotation 25% limited   (Blank rows = not tested)   BACK PALPATION: no TTP; increased soft tissue restriction in thoracic to lumbar paraspinals and R QL    UPPER EXTREMITY MMT:  MMT Right eval Left eval  Shoulder flexion 4 4+  Shoulder extension    Shoulder abduction 4 4  Shoulder adduction    Shoulder extension    Shoulder internal rotation 4+ 4+  Shoulder external rotation 4+ 4+  Middle trapezius    Lower trapezius    Elbow flexion 4 4  Elbow extension 4 4  Wrist flexion 4+ 4-  Wrist extension 4+ 4-  Wrist ulnar deviation    Wrist radial deviation    Wrist pronation    Wrist supination    Grip strength 35, 35, 35 lbs (35lbs avg) *R hand dominant 8, 4, 4 lbs (5.3 lb avg) *pt reports she "did something to my L elbow" recently    (Blank rows = not tested)    LOWER EXTREMITY MMT:   MMT (in sitting) Right eval Left eval  Hip flexion 5 5  Hip abduction 4+ 4+  Hip adduction 4+ 4+  Hip internal rotation    Hip external rotation    Knee flexion 4+ 4-  Knee extension 5 5  Ankle dorsiflexion 5 5  Ankle plantarflexion 4+ 4+  Ankle inversion    Ankle eversion    (Blank rows = not tested)    JOINT MOBILITY: TTP with gentle central PAs over sacrum and pressure over R sacral ALA; very TTP and poor tolerance of central  PAs over thoracic spine   GAIT: Gait pattern:  L shoulder elevated, R shoulder depressed, narrow BOS Assistive device utilized: None Level of assistance: Complete Independence     PATIENT EDUCATION: Education details: prognosis, POC, exam findings, HEP Person educated: Patient Education method: Explanation, Demonstration, Tactile cues, Verbal cues, and Handouts Education comprehension: verbalized understanding  HOME EXERCISE PROGRAM: Access Code: [email or web address] URL: [email or web address] Date: 06/22/2022 Prepared by: [facility]  Exercises - Seated Cervical Retraction  - 1 x daily - 5 x weekly - 2 sets - 10 reps - 3 sec hold - Seated Thoracic Lumbar Extension  - 1 x daily - 5 x weekly - 2 sets - 10 reps - Supine Shoulder Horizontal Abduction with Resistance  - 1 x daily - 5 x weekly - 2 sets - 10 reps - Prone on Elbows Stretch  - 1 x daily - 5 x weekly - 2 sets - 10 reps   GOALS: Goals reviewed with patient? Yes  SHORT TERM GOALS: Target date: 07/13/2022  Patient to be independent with initial HEP. Baseline: HEP initiated Goal status: MET    LONG TERM GOALS: Target date: 08/03/2022  Patient to be independent with advanced HEP. Baseline: Not yet initiated  Goal status: IN PROGRESS  Patient to demonstrate cervical and lumbar AROM WFL and without pain limiting.  Baseline: see above Goal status: IN PROGRESS  Patient will report 0/10 dizziness with bed mobility.  Baseline: - Goal status: IN PROGRESS  Patient to demonstrate mild-moderate sway with M-CTSIB condition with eyes closed/foam surface in order to improve safety in environments with uneven surfaces and dim lighting. Baseline: NT Goal status: IN PROGRESS  Patient to score at least 20/24 on DGI in order to decrease risk of falls. Baseline: NT Goal status: IN PROGRESS  Patient to report resolution of R UE N/T.  Baseline: ongoing Goal status: IN  PROGRESS  ASSESSMENT:  CLINICAL IMPRESSION:  Patient arrived to session with report of receiving her cervical imaging results and reports that she has an appointment to discuss possible surgery tomorrow. Reports dizziness worse this week. Requesting to avoid vestibular activities today d/t dizziness. Patient performed periscapular and core strengthening exercises with cueing provided intermittently for form. D/t patient's report of dizziness worse this week, required sitting rest break to allow symptoms to dissipate between activities. Patient requested to end session early d/t dizziness and pain. No other complaints at end of session.    OBJECTIVE IMPAIRMENTS: Abnormal gait, decreased activity tolerance, decreased balance, decreased ROM, decreased strength, dizziness, increased muscle spasms, impaired flexibility, improper body mechanics, postural dysfunction, and pain.   ACTIVITY LIMITATIONS: carrying, lifting, bending, sitting, standing, squatting, sleeping, stairs, transfers, bathing, toileting, dressing, reach over head, hygiene/grooming, and caring for others  PARTICIPATION LIMITATIONS: meal prep, cleaning, laundry, driving, shopping, community activity, occupation, yard work, and church  PERSONAL FACTORS: Age, Fitness, Past/current experiences, Time since onset of injury/illness/exacerbation, and 3+ comorbidities: anxiety, asthma, R bunionectomy; RA  are also affecting patient's functional outcome.   REHAB POTENTIAL: Good  CLINICAL DECISION MAKING: Evolving/moderate complexity  EVALUATION COMPLEXITY: Moderate   PLAN:  PT FREQUENCY: 1-2x/week  PT DURATION: 6 weeks  PLANNED INTERVENTIONS: Therapeutic exercises, Therapeutic activity, Neuromuscular re-education, Balance training, Gait training, Patient/Family education, Self Care, Joint mobilization, Stair training, Vestibular training, Canalith repositioning, Aquatic Therapy, Dry Needling, Electrical stimulation, Cryotherapy,  Moist heat, Taping, Traction, Manual therapy, and Re-evaluation  PLAN FOR NEXT SESSION:  progress thoracic and lumbar jt mobility, postural strengthening, gaze stabilization and habituation    Janene Harvey, PT, DPT 07/18/22 8:34 AM  [facility] at [facility] [address], [place], [place] [address] Phone # [phone] Fax # [phone]

## 2022-07-15 DIAGNOSIS — G894 Chronic pain syndrome: Secondary | ICD-10-CM | POA: Diagnosis not present

## 2022-07-15 DIAGNOSIS — M5412 Radiculopathy, cervical region: Secondary | ICD-10-CM | POA: Diagnosis not present

## 2022-07-16 DIAGNOSIS — L03116 Cellulitis of left lower limb: Secondary | ICD-10-CM | POA: Diagnosis not present

## 2022-07-16 DIAGNOSIS — K130 Diseases of lips: Secondary | ICD-10-CM | POA: Diagnosis not present

## 2022-07-18 ENCOUNTER — Ambulatory Visit: Payer: 59 | Admitting: Physical Therapy

## 2022-07-18 ENCOUNTER — Encounter: Payer: Self-pay | Admitting: Physical Therapy

## 2022-07-18 DIAGNOSIS — M5459 Other low back pain: Secondary | ICD-10-CM

## 2022-07-18 DIAGNOSIS — R2681 Unsteadiness on feet: Secondary | ICD-10-CM

## 2022-07-18 DIAGNOSIS — M542 Cervicalgia: Secondary | ICD-10-CM | POA: Diagnosis not present

## 2022-07-18 DIAGNOSIS — R293 Abnormal posture: Secondary | ICD-10-CM

## 2022-07-18 DIAGNOSIS — R42 Dizziness and giddiness: Secondary | ICD-10-CM | POA: Diagnosis not present

## 2022-07-19 ENCOUNTER — Ambulatory Visit: Payer: 59 | Admitting: Physical Therapy

## 2022-07-19 DIAGNOSIS — M5412 Radiculopathy, cervical region: Secondary | ICD-10-CM | POA: Diagnosis not present

## 2022-07-19 DIAGNOSIS — T148XXA Other injury of unspecified body region, initial encounter: Secondary | ICD-10-CM | POA: Diagnosis not present

## 2022-07-23 ENCOUNTER — Other Ambulatory Visit: Payer: Self-pay | Admitting: Allergy

## 2022-07-25 NOTE — Therapy (Incomplete)
OUTPATIENT PHYSICAL THERAPY NEURO TREATMENT     Patient Name: Anne Jefferson MRN: [id number] DOB:1975/12/03, 47 y.o., female Today's Date: 07/25/2022  END OF SESSION:       Past Medical History:  Diagnosis Date   Abnormal Pap smear    Anxiety    no meds   Asthma    mild, no issue for years no  inhaler use except in winter   Biliary colic    Blood type, Rh negative    Gallstones    H/O dysmenorrhea    Infertility, female    Nausea and vomiting    Pain    back and chest realted to gallbladder since november 2018   PONV (postoperative nausea and vomiting)    severe, needs heavy anti nausea meds   Spinal stenosis    Past Surgical History:  Procedure Laterality Date    c sections     x 2  3 births last was twins   BUNIONECTOMY Right    CHOLECYSTECTOMY N/A 11/22/2017   Procedure: [place];  Surgeon: Mickeal Skinner, MD;  Location: [facility];  Service: General;  Laterality: N/A;   LEEP  12/23/2009   WISDOM TOOTH EXTRACTION     Patient Active Problem List   Diagnosis Date Noted   Lightheadedness 02/03/2022   Anxiety 02/03/2022   Obesity (BMI 30-39.9) 02/03/2022   Abnormal TSH 02/03/2022   Cervical spondylosis with radiculopathy 02/02/2022   Rheumatoid arthritis ([place]) 02/02/2022   Adult stuttering 02/01/2022   History of nasal polyposis 11/17/2021   Acute cough 10/20/2021   Viral illness 10/20/2021   Moderate persistent asthma without complication 22/48/2500   Moderate persistent asthma with (acute) exacerbation 11/16/2020   Shooting pain 07/29/2020   Atopic dermatitis 04/01/2019   Perennial and seasonal allergic rhinitis 09/17/2018   Acute non-recurrent frontal sinusitis 09/17/2018   Infertility, female    Abnormal Pap smear    H/O dysmenorrhea    Blood type, Rh negative    METATARSALGIA 12/18/2007   BUNIONS, LEFT FOOT 12/18/2007   UNEQUAL LEG LENGTH 12/18/2007   SPRAIN&STRAIN OF UNSPECIFIED SITE OF KNEE&LEG  11/13/2007    PCP: Josetta Huddle, MD  REFERRING PROVIDER: Kathalene Frames, MD  REFERRING DIAG: R42 (ICD-10-CM) - Dizziness and giddiness M54.50 (ICD-10-CM) - Low back pain, unspecified  THERAPY DIAG:  No diagnosis found.  ONSET DATE: October 2023  Rationale for Evaluation and Treatment: Rehabilitation  SUBJECTIVE:   SUBJECTIVE STATEMENT: Would like to discuss her cervical imaging. Meets with the spine specialist tomorrow at [facility] and they are talking about surgery. Dizziness has been worse this this week- had more trouble completing a walk yesterday. Now on Doxycycline d/t a new infection on her face.  Pt accompanied by: self  PERTINENT HISTORY: anxiety, asthma, R bunionectomy   PAIN:  Are you having pain? Yes: NPRS scale: 4/10 Pain location: upper back and R arm Pain description: constant Aggravating factors: driving, sitting, standing Relieving factors: laying own    PRECAUTIONS: Fall  WEIGHT BEARING RESTRICTIONS: No  FALLS: Has patient fallen in last 6 months? 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   PATIENT GOALS: improve pain  OBJECTIVE:     TODAY'S TREATMENT: 07/27/22 Activity Comments  HOME EXERCISE PROGRAM Last updated: 07/14/22 Access Code: [facility] URL: [email or web address] Date: 07/14/2022 Prepared by: [facility]  Exercises - Seated Thoracic Lumbar Extension  - 1 x daily - 5 x weekly - 2 sets - 10 reps - Supine Shoulder Horizontal Abduction with Resistance  - 1 x daily - 5 x weekly - 2 sets - 10 reps - Supine Shoulder External Rotation with Resistance  - 1 x daily - 5 x weekly - 2 sets - 10 reps - Supine Cervical Retraction with Towel  - 1 x daily - 5 x weekly - 2 sets - 10  reps - 3 sec  hold - Sidelying Thoracic Rotation with Open Book  - 1 x daily - 5 x weekly - 2 sets - 5 reps - Prone on Elbows Stretch  - 1 x daily - 5 x weekly - 2 sets - 10 reps - Standing with Head Nod  - 1 x daily - 5 x weekly - 2-3 sets - 30 sec hold - Standing with Head Rotation  - 1 x daily - 5 x weekly - 2-3 sets - 30 sec hold    Below measures were taken at time of initial evaluation unless otherwise specified:   DIAGNOSTIC FINDINGS: 06/01/22 lumbar CT: Suspected left inferior foraminal disc protrusion at L2-3 with possible displacement of the left L2 nerve in the lateral extraforaminal space.Possible left lateral recess disc and foraminal disc protrusion at L5-S1 which may impinge on the left L5 nerve roots although the appearance could be artifactual.  07/09/22 cervical MRI from patient's records on her phone: moderate spondylosis with thecal sac stenosis C3-4 and C5-7, moderate stenosis    COGNITION: Overall cognitive status: Within functional limits for tasks assessed   SENSATION: WFL   POSTURE:  rounded shoulders and forward head; small dowager's hump; patient frequently switching positions/needing to lay down during session d/t R UE discomfort  Cervical ROM:    Active A/PROM (deg) eval  Flexion 34 *pain  Extension 34 *pain  Right lateral flexion 32 *pain  Left lateral flexion 15 *pain  Right rotation 53 *pain  Left rotation 51 *pain  (Blank rows = not tested)  *reports crepitus with L neck rotation    NECK PALPATION: no particular TTP; increased soft tissue restriction in R>L UT and LS ,cervical paraspinals   LUMBAR ROM:   Active  A/PROM  eval  Flexion Toes   Extension 25% limited; dizzy  Right lateral flexion Distal thigh  Left lateral flexion Distal thigh  Right rotation 25% limited  Left rotation 25% limited   (Blank rows = not tested)   BACK PALPATION: no TTP; increased soft tissue restriction in thoracic to lumbar paraspinals and R  QL    UPPER EXTREMITY MMT:  MMT Right eval Left eval  Shoulder flexion 4 4+  Shoulder extension    Shoulder abduction 4 4  Shoulder adduction    Shoulder extension    Shoulder internal rotation 4+ 4+  Shoulder external rotation 4+ 4+  Middle trapezius    Lower trapezius    Elbow flexion 4 4  Elbow extension 4 4  Wrist flexion 4+ 4-  Wrist extension 4+ 4-  Wrist ulnar deviation    Wrist radial deviation    Wrist pronation    Wrist supination    Grip strength 35, 35, 35 lbs (35lbs avg) *R hand dominant 8, 4, 4 lbs (5.3 lb avg) *pt reports she "did something to my L  elbow" recently    (Blank rows = not tested)    LOWER EXTREMITY MMT:   MMT (in sitting) Right eval Left eval  Hip flexion 5 5  Hip abduction 4+ 4+  Hip adduction 4+ 4+  Hip internal rotation    Hip external rotation    Knee flexion 4+ 4-  Knee extension 5 5  Ankle dorsiflexion 5 5  Ankle plantarflexion 4+ 4+  Ankle inversion    Ankle eversion    (Blank rows = not tested)    JOINT MOBILITY: TTP with gentle central PAs over sacrum and pressure over R sacral ALA; very TTP and poor tolerance of central PAs over thoracic spine   GAIT: Gait pattern:  L shoulder elevated, R shoulder depressed, narrow BOS Assistive device utilized: None Level of assistance: Complete Independence     PATIENT EDUCATION: Education details: prognosis, POC, exam findings, HEP Person educated: Patient Education method: Explanation, Demonstration, Tactile cues, Verbal cues, and Handouts Education comprehension: verbalized understanding  HOME EXERCISE PROGRAM: Access Code: [email or web address] URL: [email or web address] Date: 06/22/2022 Prepared by: [facility]  Exercises - Seated Cervical Retraction  - 1 x daily - 5 x weekly - 2 sets - 10 reps - 3 sec hold - Seated Thoracic Lumbar Extension  - 1 x daily - 5 x weekly - 2 sets - 10 reps - Supine Shoulder Horizontal Abduction  with Resistance  - 1 x daily - 5 x weekly - 2 sets - 10 reps - Prone on Elbows Stretch  - 1 x daily - 5 x weekly - 2 sets - 10 reps   GOALS: Goals reviewed with patient? Yes  SHORT TERM GOALS: Target date: 07/13/2022  Patient to be independent with initial HEP. Baseline: HEP initiated Goal status: MET    LONG TERM GOALS: Target date: 08/03/2022  Patient to be independent with advanced HEP. Baseline: Not yet initiated  Goal status: IN PROGRESS  Patient to demonstrate cervical and lumbar AROM WFL and without pain limiting.  Baseline: see above Goal status: IN PROGRESS  Patient will report 0/10 dizziness with bed mobility.  Baseline: - Goal status: IN PROGRESS  Patient to demonstrate mild-moderate sway with M-CTSIB condition with eyes closed/foam surface in order to improve safety in environments with uneven surfaces and dim lighting. Baseline: NT Goal status: IN PROGRESS  Patient to score at least 20/24 on DGI in order to decrease risk of falls. Baseline: NT Goal status: IN PROGRESS  Patient to report resolution of R UE N/T.  Baseline: ongoing Goal status: IN PROGRESS     ASSESSMENT:  CLINICAL IMPRESSION:  Patient arrived to session with report of receiving her cervical imaging results and reports that she has an appointment to discuss possible surgery tomorrow. Reports dizziness worse this week. Requesting to avoid vestibular activities today d/t dizziness. Patient performed periscapular and core strengthening exercises with cueing provided intermittently for form. D/t patient's report of dizziness worse this week, required sitting rest break to allow symptoms to dissipate between activities. Patient requested to end session early d/t dizziness and pain. No other complaints at end of session.    OBJECTIVE IMPAIRMENTS: Abnormal gait, decreased activity tolerance, decreased balance, decreased ROM, decreased strength, dizziness, increased muscle spasms, impaired  flexibility, improper body mechanics, postural dysfunction, and pain.   ACTIVITY LIMITATIONS: carrying, lifting, bending, sitting, standing, squatting, sleeping, stairs, transfers, bathing, toileting, dressing, reach over head, hygiene/grooming, and caring for others  PARTICIPATION LIMITATIONS: meal prep, cleaning, laundry, driving, shopping,  community activity, occupation, yard work, and church  PERSONAL FACTORS: Age, Fitness, Past/current experiences, Time since onset of injury/illness/exacerbation, and 3+ comorbidities: anxiety, asthma, R bunionectomy; RA  are also affecting patient's functional outcome.   REHAB POTENTIAL: Good  CLINICAL DECISION MAKING: Evolving/moderate complexity  EVALUATION COMPLEXITY: Moderate   PLAN:  PT FREQUENCY: 1-2x/week  PT DURATION: 6 weeks  PLANNED INTERVENTIONS: Therapeutic exercises, Therapeutic activity, Neuromuscular re-education, Balance training, Gait training, Patient/Family education, Self Care, Joint mobilization, Stair training, Vestibular training, Canalith repositioning, Aquatic Therapy, Dry Needling, Electrical stimulation, Cryotherapy, Moist heat, Taping, Traction, Manual therapy, and Re-evaluation  PLAN FOR NEXT SESSION:  progress thoracic and lumbar jt mobility, postural strengthening, gaze stabilization and habituation    Janene Harvey, PT, DPT 07/25/22 11:12 AM  [facility] at [facility] [address] [place], [place] [address] Phone # [phone] Fax # [phone]

## 2022-07-27 ENCOUNTER — Ambulatory Visit: Payer: 59 | Admitting: Physical Therapy

## 2022-07-28 ENCOUNTER — Other Ambulatory Visit: Payer: Self-pay | Admitting: Allergy

## 2022-08-01 DIAGNOSIS — H4912 Fourth [trochlear] nerve palsy, left eye: Secondary | ICD-10-CM | POA: Diagnosis not present

## 2022-08-01 DIAGNOSIS — H532 Diplopia: Secondary | ICD-10-CM | POA: Diagnosis not present

## 2022-08-01 DIAGNOSIS — H5022 Vertical strabismus, left eye: Secondary | ICD-10-CM | POA: Diagnosis not present

## 2022-08-01 DIAGNOSIS — H518 Other specified disorders of binocular movement: Secondary | ICD-10-CM | POA: Diagnosis not present

## 2022-08-01 NOTE — Therapy (Signed)
OUTPATIENT PHYSICAL THERAPY NEURO TREATMENT     Patient Name: Anne Jefferson MRN: [id number] DOB:[birth year], 47 y.o., female Today's Date: 08/01/2022  END OF SESSION:       Past Medical History:  Diagnosis Date   Abnormal Pap smear    Anxiety    no meds   Asthma    mild, no issue for years no  inhaler use except in winter   Biliary colic    Blood type, Rh negative    Gallstones    H/O dysmenorrhea    Infertility, female    Nausea and vomiting    Pain    back and chest realted to gallbladder since november 2018   PONV (postoperative nausea and vomiting)    severe, needs heavy anti nausea meds   Spinal stenosis    Past Surgical History:  Procedure Laterality Date    c sections     x 2  3 births last was twins   BUNIONECTOMY Right    CHOLECYSTECTOMY N/A 11/22/2017   Procedure: Ruch;  Surgeon: Mickeal Skinner, MD;  Location: [facility];  Service: General;  Laterality: N/A;   LEEP  12/23/2009   WISDOM TOOTH EXTRACTION     Patient Active Problem List   Diagnosis Date Noted   Lightheadedness 02/03/2022   Anxiety 02/03/2022   Obesity (BMI 30-39.9) 02/03/2022   Abnormal TSH 02/03/2022   Cervical spondylosis with radiculopathy 02/02/2022   Rheumatoid arthritis ([place]) 02/02/2022   Adult stuttering 02/01/2022   History of nasal polyposis 11/17/2021   Acute cough 10/20/2021   Viral illness 10/20/2021   Moderate persistent asthma without complication 40/98/1191   Moderate persistent asthma with (acute) exacerbation 11/16/2020   Shooting pain 07/29/2020   Atopic dermatitis 04/01/2019   Perennial and seasonal allergic rhinitis 09/17/2018   Acute non-recurrent frontal sinusitis 09/17/2018   Infertility, female    Abnormal Pap smear    H/O dysmenorrhea    Blood type, Rh negative    METATARSALGIA 12/18/2007   BUNIONS, LEFT FOOT 12/18/2007   UNEQUAL LEG LENGTH 12/18/2007   SPRAIN&STRAIN OF UNSPECIFIED SITE OF KNEE&LEG  11/13/2007    PCP: Josetta Huddle, MD  REFERRING PROVIDER: Kathalene Frames, MD  REFERRING DIAG: R42 (ICD-10-CM) - Dizziness and giddiness M54.50 (ICD-10-CM) - Low back pain, unspecified  THERAPY DIAG:  No diagnosis found.  ONSET DATE: October 2023  Rationale for Evaluation and Treatment: Rehabilitation  SUBJECTIVE:   SUBJECTIVE STATEMENT: Would like to discuss her cervical imaging. Meets with the spine specialist tomorrow at [facility] and they are talking about surgery. Dizziness has been worse this this week- had more trouble completing a walk yesterday. Now on Doxycycline d/t a new infection on her face.  Pt accompanied by: self  PERTINENT HISTORY: anxiety, asthma, R bunionectomy   PAIN:  Are you having pain? Yes: NPRS scale: 4/10 Pain location: upper back and R arm Pain description: constant Aggravating factors: driving, sitting, standing Relieving factors: laying own    PRECAUTIONS: Fall  WEIGHT BEARING RESTRICTIONS: No  FALLS: Has patient fallen in last 6 months? No  LIVING ENVIRONMENT: Lives with: lives with their family Lives in: House/apartment Stairs:  stairs to enter and up to bedroom Has following equipment at home: None  PLOF: Independent; [employer] speech therapist   PATIENT GOALS: improve pain  OBJECTIVE:     TODAY'S TREATMENT: 01/010/24  Cervical ROM:    Active A/PROM (deg) eval AROM 08/02/22  Flexion 34 *pain   Extension 34 *pain  Right lateral flexion 32 *pain   Left lateral flexion 15 *pain   Right rotation 53 *pain   Left rotation 51 *pain   (Blank rows = not tested) *reports crepitus with L neck rotation   LUMBAR ROM:   Active  A/PROM  eval AROM 08/02/22  Flexion Toes    Extension 25% limited; dizzy   Right lateral flexion Distal thigh   Left lateral flexion Distal thigh   Right rotation 25% limited   Left rotation 25% limited    (Blank rows = not tested)    M-CTSIB  Condition 1: Firm Surface, EO *** Sec,  {YDKSWAY:27435} Sway  Condition 2: Firm Surface, EC *** Sec, {YDKSWAY:27435} Sway  Condition 3: Foam Surface, EO *** Sec, {YDKSWAY:27435} Sway  Condition 4: Foam Surface, EC *** Sec, {YDKSWAY:27435} Sway     Activity Comments  DGI   Bed mobility                     HOME EXERCISE PROGRAM Last updated: 07/14/22 Access Code: [facility] URL: [email or web address] Date: 07/14/2022 Prepared by: [facility]  Exercises - Seated Thoracic Lumbar Extension  - 1 x daily - 5 x weekly - 2 sets - 10 reps - Supine Shoulder Horizontal Abduction with Resistance  - 1 x daily - 5 x weekly - 2 sets - 10 reps - Supine Shoulder External Rotation with Resistance  - 1 x daily - 5 x weekly - 2 sets - 10 reps - Supine Cervical Retraction with Towel  - 1 x daily - 5 x weekly - 2 sets - 10 reps - 3 sec  hold - Sidelying Thoracic Rotation with Open Book  - 1 x daily - 5 x weekly - 2 sets - 5 reps - Prone on Elbows Stretch  - 1 x daily - 5 x weekly - 2 sets - 10 reps - Standing with Head Nod  - 1 x daily - 5 x weekly - 2-3 sets - 30 sec hold - Standing with Head Rotation  - 1 x daily - 5 x weekly - 2-3 sets - 30 sec hold    Below measures were taken at time of initial evaluation unless otherwise specified:   DIAGNOSTIC FINDINGS: 06/01/22 lumbar CT: Suspected left inferior foraminal disc protrusion at L2-3 with possible displacement of the left L2 nerve in the lateral extraforaminal space.Possible left lateral recess disc and foraminal disc protrusion at L5-S1 which may impinge on the left L5 nerve roots although the appearance could be artifactual.  07/09/22 cervical MRI from patient's records on her phone: moderate spondylosis with thecal sac stenosis C3-4 and C5-7, moderate stenosis    COGNITION: Overall cognitive status: Within functional limits for tasks assessed   SENSATION: WFL   POSTURE:  rounded shoulders and forward head; small  dowager's hump; patient frequently switching positions/needing to lay down during session d/t R UE discomfort  Cervical ROM:    Active A/PROM (deg) eval  Flexion 34 *pain  Extension 34 *pain  Right lateral flexion 32 *pain  Left lateral flexion 15 *pain  Right rotation 53 *pain  Left rotation 51 *pain  (Blank rows = not tested)  *reports crepitus with L neck rotation    NECK PALPATION: no particular TTP; increased soft tissue restriction in R>L UT and LS ,cervical paraspinals   LUMBAR ROM:   Active  A/PROM  eval  Flexion Toes   Extension 25% limited; dizzy  Right lateral flexion Distal  thigh  Left lateral flexion Distal thigh  Right rotation 25% limited  Left rotation 25% limited   (Blank rows = not tested)   BACK PALPATION: no TTP; increased soft tissue restriction in thoracic to lumbar paraspinals and R QL    UPPER EXTREMITY MMT:  MMT Right eval Left eval  Shoulder flexion 4 4+  Shoulder extension    Shoulder abduction 4 4  Shoulder adduction    Shoulder extension    Shoulder internal rotation 4+ 4+  Shoulder external rotation 4+ 4+  Middle trapezius    Lower trapezius    Elbow flexion 4 4  Elbow extension 4 4  Wrist flexion 4+ 4-  Wrist extension 4+ 4-  Wrist ulnar deviation    Wrist radial deviation    Wrist pronation    Wrist supination    Grip strength 35, 35, 35 lbs (35lbs avg) *R hand dominant 8, 4, 4 lbs (5.3 lb avg) *pt reports she "did something to my L elbow" recently    (Blank rows = not tested)    LOWER EXTREMITY MMT:   MMT (in sitting) Right eval Left eval  Hip flexion 5 5  Hip abduction 4+ 4+  Hip adduction 4+ 4+  Hip internal rotation    Hip external rotation    Knee flexion 4+ 4-  Knee extension 5 5  Ankle dorsiflexion 5 5  Ankle plantarflexion 4+ 4+  Ankle inversion    Ankle eversion    (Blank rows = not tested)    JOINT MOBILITY: TTP with gentle central PAs over sacrum and pressure over R sacral ALA; very TTP and  poor tolerance of central PAs over thoracic spine   GAIT: Gait pattern:  L shoulder elevated, R shoulder depressed, narrow BOS Assistive device utilized: None Level of assistance: Complete Independence     PATIENT EDUCATION: Education details: prognosis, POC, exam findings, HEP Person educated: Patient Education method: Explanation, Demonstration, Tactile cues, Verbal cues, and Handouts Education comprehension: verbalized understanding  HOME EXERCISE PROGRAM: Access Code: [email or web address] URL: [email or web address] Date: 06/22/2022 Prepared by: [facility]  Exercises - Seated Cervical Retraction  - 1 x daily - 5 x weekly - 2 sets - 10 reps - 3 sec hold - Seated Thoracic Lumbar Extension  - 1 x daily - 5 x weekly - 2 sets - 10 reps - Supine Shoulder Horizontal Abduction with Resistance  - 1 x daily - 5 x weekly - 2 sets - 10 reps - Prone on Elbows Stretch  - 1 x daily - 5 x weekly - 2 sets - 10 reps   GOALS: Goals reviewed with patient? Yes  SHORT TERM GOALS: Target date: 07/13/2022  Patient to be independent with initial HEP. Baseline: HEP initiated Goal status: MET    LONG TERM GOALS: Target date: 08/03/2022  Patient to be independent with advanced HEP. Baseline: Not yet initiated  Goal status: IN PROGRESS  Patient to demonstrate cervical and lumbar AROM WFL and without pain limiting.  Baseline: see above Goal status: IN PROGRESS  Patient will report 0/10 dizziness with bed mobility.  Baseline: - Goal status: IN PROGRESS  Patient to demonstrate mild-moderate sway with M-CTSIB condition with eyes closed/foam surface in order to improve safety in environments with uneven surfaces and dim lighting. Baseline: NT Goal status: IN PROGRESS  Patient to score at least 20/24 on DGI in order to decrease risk of falls. Baseline: NT Goal status: IN PROGRESS  Patient to report  resolution of R UE N/T.  Baseline: ongoing Goal  status: IN PROGRESS     ASSESSMENT:  CLINICAL IMPRESSION:  Patient arrived to session with report of receiving her cervical imaging results and reports that she has an appointment to discuss possible surgery tomorrow. Reports dizziness worse this week. Requesting to avoid vestibular activities today d/t dizziness. Patient performed periscapular and core strengthening exercises with cueing provided intermittently for form. D/t patient's report of dizziness worse this week, required sitting rest break to allow symptoms to dissipate between activities. Patient requested to end session early d/t dizziness and pain. No other complaints at end of session.    OBJECTIVE IMPAIRMENTS: Abnormal gait, decreased activity tolerance, decreased balance, decreased ROM, decreased strength, dizziness, increased muscle spasms, impaired flexibility, improper body mechanics, postural dysfunction, and pain.   ACTIVITY LIMITATIONS: carrying, lifting, bending, sitting, standing, squatting, sleeping, stairs, transfers, bathing, toileting, dressing, reach over head, hygiene/grooming, and caring for others  PARTICIPATION LIMITATIONS: meal prep, cleaning, laundry, driving, shopping, community activity, occupation, yard work, and church  PERSONAL FACTORS: Age, Fitness, Past/current experiences, Time since onset of injury/illness/exacerbation, and 3+ comorbidities: anxiety, asthma, R bunionectomy; RA  are also affecting patient's functional outcome.   REHAB POTENTIAL: Good  CLINICAL DECISION MAKING: Evolving/moderate complexity  EVALUATION COMPLEXITY: Moderate   PLAN:  PT FREQUENCY: 1-2x/week  PT DURATION: 6 weeks  PLANNED INTERVENTIONS: Therapeutic exercises, Therapeutic activity, Neuromuscular re-education, Balance training, Gait training, Patient/Family education, Self Care, Joint mobilization, Stair training, Vestibular training, Canalith repositioning, Aquatic Therapy, Dry Needling, Electrical stimulation,  Cryotherapy, Moist heat, Taping, Traction, Manual therapy, and Re-evaluation  PLAN FOR NEXT SESSION:  progress thoracic and lumbar jt mobility, postural strengthening, gaze stabilization and habituation    Janene Harvey, PT, DPT 08/01/22 9:47 AM  [facility] at [facility] [address] [place], [place] [address] Phone # [phone] Fax # [phone]

## 2022-08-02 ENCOUNTER — Encounter: Payer: Self-pay | Admitting: Physical Therapy

## 2022-08-02 ENCOUNTER — Ambulatory Visit: Payer: 59 | Attending: Internal Medicine | Admitting: Physical Therapy

## 2022-08-02 DIAGNOSIS — R293 Abnormal posture: Secondary | ICD-10-CM | POA: Insufficient documentation

## 2022-08-02 DIAGNOSIS — M545 Low back pain, unspecified: Secondary | ICD-10-CM | POA: Diagnosis not present

## 2022-08-02 DIAGNOSIS — M5459 Other low back pain: Secondary | ICD-10-CM | POA: Diagnosis not present

## 2022-08-02 DIAGNOSIS — G894 Chronic pain syndrome: Secondary | ICD-10-CM | POA: Diagnosis not present

## 2022-08-02 DIAGNOSIS — M5412 Radiculopathy, cervical region: Secondary | ICD-10-CM | POA: Diagnosis not present

## 2022-08-02 DIAGNOSIS — R42 Dizziness and giddiness: Secondary | ICD-10-CM | POA: Diagnosis not present

## 2022-08-02 DIAGNOSIS — R2681 Unsteadiness on feet: Secondary | ICD-10-CM | POA: Insufficient documentation

## 2022-08-02 DIAGNOSIS — M542 Cervicalgia: Secondary | ICD-10-CM | POA: Insufficient documentation

## 2022-08-04 ENCOUNTER — Ambulatory Visit (INDEPENDENT_AMBULATORY_CARE_PROVIDER_SITE_OTHER): Payer: 59

## 2022-08-04 DIAGNOSIS — J309 Allergic rhinitis, unspecified: Secondary | ICD-10-CM | POA: Diagnosis not present

## 2022-08-08 ENCOUNTER — Ambulatory Visit: Payer: Self-pay

## 2022-08-08 DIAGNOSIS — M5412 Radiculopathy, cervical region: Secondary | ICD-10-CM | POA: Diagnosis not present

## 2022-08-09 DIAGNOSIS — R030 Elevated blood-pressure reading, without diagnosis of hypertension: Secondary | ICD-10-CM | POA: Diagnosis not present

## 2022-08-09 DIAGNOSIS — G8929 Other chronic pain: Secondary | ICD-10-CM | POA: Diagnosis not present

## 2022-08-10 DIAGNOSIS — K130 Diseases of lips: Secondary | ICD-10-CM | POA: Diagnosis not present

## 2022-08-10 DIAGNOSIS — L821 Other seborrheic keratosis: Secondary | ICD-10-CM | POA: Diagnosis not present

## 2022-08-14 DIAGNOSIS — R768 Other specified abnormal immunological findings in serum: Secondary | ICD-10-CM | POA: Diagnosis not present

## 2022-08-14 DIAGNOSIS — Z79899 Other long term (current) drug therapy: Secondary | ICD-10-CM | POA: Diagnosis not present

## 2022-08-14 DIAGNOSIS — M2550 Pain in unspecified joint: Secondary | ICD-10-CM | POA: Diagnosis not present

## 2022-08-18 ENCOUNTER — Ambulatory Visit (INDEPENDENT_AMBULATORY_CARE_PROVIDER_SITE_OTHER): Payer: 59

## 2022-08-18 DIAGNOSIS — J309 Allergic rhinitis, unspecified: Secondary | ICD-10-CM | POA: Diagnosis not present

## 2022-08-21 DIAGNOSIS — Z79899 Other long term (current) drug therapy: Secondary | ICD-10-CM | POA: Diagnosis not present

## 2022-08-21 DIAGNOSIS — M2550 Pain in unspecified joint: Secondary | ICD-10-CM | POA: Diagnosis not present

## 2022-08-21 DIAGNOSIS — R768 Other specified abnormal immunological findings in serum: Secondary | ICD-10-CM | POA: Diagnosis not present

## 2022-08-23 DIAGNOSIS — M47816 Spondylosis without myelopathy or radiculopathy, lumbar region: Secondary | ICD-10-CM | POA: Diagnosis not present

## 2022-08-23 DIAGNOSIS — M47812 Spondylosis without myelopathy or radiculopathy, cervical region: Secondary | ICD-10-CM | POA: Diagnosis not present

## 2022-08-23 DIAGNOSIS — M47817 Spondylosis without myelopathy or radiculopathy, lumbosacral region: Secondary | ICD-10-CM | POA: Diagnosis not present

## 2022-08-25 DIAGNOSIS — M5416 Radiculopathy, lumbar region: Secondary | ICD-10-CM | POA: Diagnosis not present

## 2022-08-25 DIAGNOSIS — M5412 Radiculopathy, cervical region: Secondary | ICD-10-CM | POA: Diagnosis not present

## 2022-08-28 ENCOUNTER — Ambulatory Visit (INDEPENDENT_AMBULATORY_CARE_PROVIDER_SITE_OTHER): Payer: 59

## 2022-08-28 DIAGNOSIS — J309 Allergic rhinitis, unspecified: Secondary | ICD-10-CM | POA: Diagnosis not present

## 2022-09-06 DIAGNOSIS — M47812 Spondylosis without myelopathy or radiculopathy, cervical region: Secondary | ICD-10-CM | POA: Diagnosis not present

## 2022-09-06 DIAGNOSIS — M47816 Spondylosis without myelopathy or radiculopathy, lumbar region: Secondary | ICD-10-CM | POA: Diagnosis not present

## 2022-09-07 ENCOUNTER — Ambulatory Visit (INDEPENDENT_AMBULATORY_CARE_PROVIDER_SITE_OTHER): Payer: 59

## 2022-09-07 DIAGNOSIS — J309 Allergic rhinitis, unspecified: Secondary | ICD-10-CM

## 2022-09-14 ENCOUNTER — Ambulatory Visit: Payer: 59 | Admitting: Allergy

## 2022-09-14 ENCOUNTER — Ambulatory Visit (INDEPENDENT_AMBULATORY_CARE_PROVIDER_SITE_OTHER): Payer: 59

## 2022-09-14 DIAGNOSIS — J309 Allergic rhinitis, unspecified: Secondary | ICD-10-CM | POA: Diagnosis not present

## 2022-09-22 ENCOUNTER — Ambulatory Visit (INDEPENDENT_AMBULATORY_CARE_PROVIDER_SITE_OTHER): Payer: 59 | Admitting: *Deleted

## 2022-09-22 ENCOUNTER — Telehealth: Payer: Self-pay | Admitting: *Deleted

## 2022-09-22 DIAGNOSIS — J309 Allergic rhinitis, unspecified: Secondary | ICD-10-CM | POA: Diagnosis not present

## 2022-09-22 NOTE — Telephone Encounter (Signed)
Patient's allergy flow sheet has been updated to reflect these changes.

## 2022-09-22 NOTE — Telephone Encounter (Signed)
Patient states that she has been having large local reactions to her Pollen vial at about 4+ for quiet some time, I explained to her the process and benefit of Epiwash and she would like to give it a try, would it be ok to give the patient Epiwash with her allergy vials?

## 2022-09-26 DIAGNOSIS — M5412 Radiculopathy, cervical region: Secondary | ICD-10-CM | POA: Diagnosis not present

## 2022-09-26 DIAGNOSIS — M5416 Radiculopathy, lumbar region: Secondary | ICD-10-CM | POA: Diagnosis not present

## 2022-09-26 DIAGNOSIS — Z6836 Body mass index (BMI) 36.0-36.9, adult: Secondary | ICD-10-CM | POA: Diagnosis not present

## 2022-09-27 DIAGNOSIS — M2550 Pain in unspecified joint: Secondary | ICD-10-CM | POA: Diagnosis not present

## 2022-09-27 DIAGNOSIS — Z79899 Other long term (current) drug therapy: Secondary | ICD-10-CM | POA: Diagnosis not present

## 2022-09-27 DIAGNOSIS — M0609 Rheumatoid arthritis without rheumatoid factor, multiple sites: Secondary | ICD-10-CM | POA: Diagnosis not present

## 2022-09-27 DIAGNOSIS — R768 Other specified abnormal immunological findings in serum: Secondary | ICD-10-CM | POA: Diagnosis not present

## 2022-09-27 DIAGNOSIS — M25522 Pain in left elbow: Secondary | ICD-10-CM | POA: Diagnosis not present

## 2022-10-02 ENCOUNTER — Ambulatory Visit (INDEPENDENT_AMBULATORY_CARE_PROVIDER_SITE_OTHER): Payer: 59 | Admitting: *Deleted

## 2022-10-02 DIAGNOSIS — J309 Allergic rhinitis, unspecified: Secondary | ICD-10-CM | POA: Diagnosis not present

## 2022-10-02 NOTE — Progress Notes (Signed)
HPI: [name] is a 47 y.o. female, who is here today to establish care.  Former PCP: Dr Koleen Nimrod. Last preventive routine visit: 01/2022.  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  Polyarthralgia, upper and lower back pain, and myalgias. She reports that pain is getting worse, exacerbated by daily activities. She follows with pain management and currently she is on tramadol, Celebrex 100 mg, gabapentin, and duloxetine. She has received cervical and lumbar epidural injections, which have not helped with pain.  She has been experiencing severe pain, especially in her hands and feet.  She has seen a rheumatologist about a week ago and has been following up with them every six weeks.  On chronic prednisone treatment, she takes 5 to 10 mg daily. She is also on Methotrexate, recently adjusted,and Plaquenil 200 mg bid.  Her glucose has been elevated in the past, up to 180s. No history of diabetes. Hemoglobin A1c in 01/2022 was normal at 5.4. History of gestational diabetes.  She also has a history of vertigo and following with neurologist.  She does not exercise regularly due to pain, states that she has gained approximately 60 pounds in the last one to two years. She reports trying to maintain a healthy diet.   She reports history of gastrointestinal issues, including episodes of diarrhea and constipation, which have been present for years. She has been tested for Celiac disease every two years due to a family history of celiac disease and "autoimmune conditions." She is on Bentyl 20 mg bid prn and Colace 100 mg bid prn.  Asthma and allergy rhinitis, currently she is on immunotherapy and follows with immunology regularly. She noted some wheezing this morning. She is on Breztri 160-9-4 0.8 mcg 2 puff daily,Pulmicort 0.5 mg neb tid prn, and Albuterol inh and neb treatments as needed. Hypertension: According to patient, BP elevation has  been attributed to severe pain.  She is currently on losartan 25 mg daily.  Lab Results  Component Value Date   CREATININE 0.97 06/01/2022   BUN 15 06/01/2022   NA 140 06/01/2022   K 4.3 06/01/2022   CL 100 06/01/2022   CO2 32 06/01/2022   Hyperlipidemia: Currently she is on nonpharmacologic treatment. Lab Results  Component Value Date   CHOL 171 02/03/2022   HDL 51 02/03/2022   LDLCALC 88 02/03/2022   TRIG 162 (H) 02/03/2022   CHOLHDL 3.4 02/03/2022  GERD:: She is on omeprazole 40 mg daily, initially diagnosed because complaints of mid chest pain, which resolved after starting medication.  Review of Systems  Constitutional:  Positive for fatigue. Negative for appetite change and fever.  HENT:  Positive for mouth sores (Intermittent around mouth angles,attributed to Methotrexate.). Negative for nosebleeds and sore throat.   Respiratory:  Positive for wheezing. Negative for cough and shortness of breath.   Cardiovascular:  Negative for chest pain, palpitations and leg swelling.  Gastrointestinal:  Negative for abdominal pain, nausea and vomiting.  Endocrine: Negative for cold intolerance and heat intolerance.  Genitourinary:  Negative for decreased urine volume, dysuria and hematuria.  Musculoskeletal:  Positive for arthralgias and myalgias.  Skin:  Negative for rash.  Allergic/Immunologic: Positive for environmental allergies.  Neurological:  Negative for syncope, facial asymmetry, weakness and headaches.  Psychiatric/Behavioral:  Negative for confusion. The patient is nervous/anxious.   See other pertinent positives and negatives in HPI.  Current Outpatient Medications on File Prior to Visit  Medication Sig Dispense Refill   albuterol (PROVENTIL HFA) 108 (90 Base)  MCG/ACT inhaler Inhale 2 puffs into the lungs every 4 (four) hours as needed for wheezing or shortness of breath. 3 each 0   albuterol (PROVENTIL) (2.5 MG/3ML) 0.083% nebulizer solution Use 1 unit dose via the  nebulizer every 6 hours as needed for cough, wheeze, tightness in chest, or shortness of breath. 75 mL 3   Albuterol-Budesonide (AIRSUPRA) 90-80 MCG/ACT AERO Inhale 2 puffs into the lungs every 4 (four) hours as needed (cough, wheeze, shortness of breath). 32.1 g 1   BREZTRI AEROSPHERE 160-9-4.8 MCG/ACT AERO Inhale 2 puffs into the lungs in the morning and at bedtime. 32.1 g 1   budesonide (PULMICORT) 0.5 MG/2ML nebulizer solution TAKE 2 MLS (0.5 MG TOTAL) BY NEBULIZATION 3 (THREE) TIMES DAILY AS NEEDED (ASTHMA FLARE). 540 mL 1   celecoxib (CELEBREX) 100 MG capsule Take 100 mg by mouth 2 (two) times daily.     dicyclomine (BENTYL) 20 MG tablet Take 1 tablet (20 mg total) by mouth 2 (two) times daily as needed (abdominal spasms). 20 tablet 0   docusate sodium (COLACE) 100 MG capsule Take 1 capsule (100 mg total) by mouth every 12 (twelve) hours. 60 capsule 0   DULoxetine (CYMBALTA) 30 MG capsule Take 90 mg by mouth daily.     EPINEPHrine (AUVI-Q) 0.3 mg/0.3 mL IJ SOAJ injection Inject 0.3 mg into the muscle as needed for anaphylaxis. 2 each 1   fexofenadine (ALLEGRA) 180 MG tablet Take 180 mg by mouth 2 (two) times daily.     folic acid (FOLVITE) 1 MG tablet Take 1 mg by mouth daily. Everyday but Thursdays     gabapentin (NEURONTIN) 300 MG capsule Take 300 mg by mouth 3 (three) times daily.     hydroxychloroquine (PLAQUENIL) 200 MG tablet Take 200 mg by mouth 2 (two) times daily.     losartan (COZAAR) 25 MG tablet Take 25 mg by mouth daily.     methotrexate (RHEUMATREX) 2.5 MG tablet Take 10 mg by mouth once a week. Caution:Chemotherapy. Protect from light. Takes on Thursdays     mometasone (NASONEX) 50 MCG/ACT nasal spray Place 1 spray into the nose daily. (Patient taking differently: Place 1 spray into the nose 2 (two) times daily.) 1 each 3   omeprazole (PRILOSEC) 40 MG capsule Take 40 mg by mouth daily.     predniSONE (DELTASONE) 20 MG tablet Taper as prescribed by rheumatology     predniSONE  (DELTASONE) 5 MG tablet Take 10 mg by mouth daily.     traMADol (ULTRAM) 50 MG tablet Take 100 mg by mouth 1 day or 1 dose.     UNABLE TO FIND 2 (two) times a week. Med Name: ALLERGY SHOTS     VITAMIN D PO Take by mouth daily.     No current facility-administered medications on file prior to visit.   Past Medical History:  Diagnosis Date   Abnormal Pap smear    Anxiety    no meds   Asthma    mild, no issue for years no  inhaler use except in winter   Biliary colic    Blood type, Rh negative    Gallstones    H/O dysmenorrhea    Infertility, female    Nausea and vomiting    Pain    back and chest realted to gallbladder since november 2018   PONV (postoperative nausea and vomiting)    severe, needs heavy anti nausea meds   Spinal stenosis    Allergies  Allergen Reactions  Promethazine Other (See Comments)    Hallucinations   Theophyllines Nausea And Vomiting   Mite (D. Farinae) Other (See Comments)    Cats, dogs, dust    Molds & Smuts Other (See Comments)   Family History  Problem Relation Age of Onset   Allergic rhinitis Mother    Osteoarthritis Mother    Healthy Brother    Healthy Brother    Diabetes Maternal Grandmother    Skin cancer Maternal Grandmother    Celiac disease Son    Autoimmune disease Son    Anemia Son    Diabetes Son        borderline   Social History   Socioeconomic History   Marital status: Divorced    Spouse name: Not on file   Number of children: 1   Years of education: Not on file   Highest education level: Master's degree (e.g., MA, MS, MEng, MEd, MSW, MBA)  Occupational History    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  Tobacco Use   Smoking status: Never   Smokeless tobacco: Never  Vaping Use   Vaping Use: Never used  Substance and Sexual Activity   Alcohol use: Yes    Comment: wine occ   Drug use: No   Sexual activity: Yes    Birth control/protection: None  Other Topics Concern   Not on file  Social History Narrative    Not on file   Social Determinants of Health   Financial Resource Strain: Not on file  Food Insecurity: Not on file  Transportation Needs: Not on file  Physical Activity: Not on file  Stress: Not on file  Social Connections: Not on file   Vitals:   10/03/22 0754  BP: 120/74  Pulse: 82  Resp: 12  SpO2: 97%   Body mass index is 35.8 kg/m.  Physical Exam Vitals and nursing note reviewed.  Constitutional:      General: She is not in acute distress.    Appearance: She is well-developed.  HENT:     Head: Normocephalic and atraumatic.     Mouth/Throat:     Mouth: Mucous membranes are moist.     Pharynx: Oropharynx is clear.  Eyes:     Conjunctiva/sclera: Conjunctivae normal.  Cardiovascular:     Rate and Rhythm: Normal rate and regular rhythm.     Pulses:          Dorsalis pedis pulses are 2+ on the right side and 2+ on the left side.     Heart sounds: No murmur heard. Pulmonary:     Effort: Pulmonary effort is normal. No respiratory distress.     Breath sounds: Normal breath sounds.  Abdominal:     Palpations: Abdomen is soft. There is no hepatomegaly or mass.     Tenderness: There is no abdominal tenderness.  Musculoskeletal:     Comments: No signs of synovitis appreciated.  Lymphadenopathy:     Cervical: No cervical adenopathy.  Skin:    General: Skin is warm.     Findings: No erythema or rash.  Neurological:     General: No focal deficit present.     Mental Status: She is alert and oriented to person, place, and time.     Cranial Nerves: No cranial nerve deficit.     Gait: Gait normal.     Comments: Antalgic gait, not assisted.  Psychiatric:        Mood and Affect: Mood is anxious. Affect is labile.   ASSESSMENT AND PLAN: Ms.  Aleah was seen today for annual exam.  Diagnoses and all orders for this visit: Lab Results  Component Value Date   CREATININE 0.80 10/03/2022   BUN 19 10/03/2022   NA 136 10/03/2022   K 4.0 10/03/2022   CL 98 10/03/2022   CO2  31 10/03/2022   Lab Results  Component Value Date   HGBA1C 5.8 10/03/2022  Chronic generalized pain disorder Assessment & Plan: Pain is not adequately controlled. On tramadol,Duloxetine, Celebrex,and gabapentin. We briefly discussed a few side effects as well as the risk of med interaction. Following with pain management.   Rheumatoid arthritis of both wrists, unspecified whether rheumatoid factor present [facility]) Assessment & Plan: Still having polyarthralgia, currently on methotrexate and Plaquenil, the former one recently increased. Following with rheumatologist.   Essential (primary) hypertension Assessment & Plan: BP adequately controlled. Continue losartan 25 mg daily and low salt diet. Eye exam is current. She follows with other providers regularly, where BP and labs are done regularly, so it is appropriate to continue following annually, before if needed.  Orders: -     Basic metabolic panel; Future  Elevated glucose level -     Basic metabolic panel; Future -     Hemoglobin A1c; Future  Gastroesophageal reflux disease without esophagitis Assessment & Plan: Problem is well-controlled with omeprazole 40 mg daily, so no changes today. Continue GERD precautions.  Orders: -     Omeprazole; Take 1 capsule (40 mg total) by mouth daily before breakfast.  Dispense: 90 capsule; Refill: 1  Moderate persistent asthma without complication Assessment & Plan: Still has episodes of wheezing. Today negative lung auscultation. She is on Breztri 160-9-4 0.8 mcg 2 puff daily, albuterol neb treatments, and Pulmicort neb treatments 3 times daily as needed. Follows with immunologist.   Alternating constipation and diarrhea Assessment & Plan: Stable for years. Reports having colonoscopy done in 2013, otherwise negative. ?  IBS. Continue Bentyl and Colace as needed for diarrhea and constipation respectively.   Colon cancer screening -     Ambulatory referral to  Gastroenterology  Pure hypertriglyceridemia Assessment & Plan: Mild, TG 162 in 01/2022. She is not fasting today. It can be repeated in a year. The 10-year ASCVD risk score (Arnett DK, et al., 2019) is: 1.2%   Values used to calculate the score:     Age: 61 years     Sex: Female     Is Non-Hispanic African American: No     Diabetic: No     Tobacco smoker: No     Systolic Blood Pressure: 123456 mmHg     Is BP treated: Yes     HDL Cholesterol: 51 mg/dL     Total Cholesterol: 171 mg/dL    Return in about 1 year (around 10/03/2023).  Khori Rosevear G. [place], MD  [facility]. [place] office.

## 2022-10-03 ENCOUNTER — Encounter: Payer: Self-pay | Admitting: Family Medicine

## 2022-10-03 ENCOUNTER — Ambulatory Visit: Payer: 59 | Admitting: Family Medicine

## 2022-10-03 VITALS — BP 120/74 | HR 82 | Resp 12 | Ht 63.0 in | Wt 202.1 lb

## 2022-10-03 DIAGNOSIS — R7309 Other abnormal glucose: Secondary | ICD-10-CM | POA: Diagnosis not present

## 2022-10-03 DIAGNOSIS — E785 Hyperlipidemia, unspecified: Secondary | ICD-10-CM

## 2022-10-03 DIAGNOSIS — E781 Pure hyperglyceridemia: Secondary | ICD-10-CM | POA: Diagnosis not present

## 2022-10-03 DIAGNOSIS — K219 Gastro-esophageal reflux disease without esophagitis: Secondary | ICD-10-CM

## 2022-10-03 DIAGNOSIS — I10 Essential (primary) hypertension: Secondary | ICD-10-CM

## 2022-10-03 DIAGNOSIS — G8929 Other chronic pain: Secondary | ICD-10-CM | POA: Diagnosis not present

## 2022-10-03 DIAGNOSIS — J4540 Moderate persistent asthma, uncomplicated: Secondary | ICD-10-CM | POA: Diagnosis not present

## 2022-10-03 DIAGNOSIS — M069 Rheumatoid arthritis, unspecified: Secondary | ICD-10-CM

## 2022-10-03 DIAGNOSIS — R198 Other specified symptoms and signs involving the digestive system and abdomen: Secondary | ICD-10-CM

## 2022-10-03 DIAGNOSIS — Z1211 Encounter for screening for malignant neoplasm of colon: Secondary | ICD-10-CM

## 2022-10-03 HISTORY — DX: Essential (primary) hypertension: I10

## 2022-10-03 HISTORY — DX: Hyperlipidemia, unspecified: E78.5

## 2022-10-03 HISTORY — DX: Gastro-esophageal reflux disease without esophagitis: K21.9

## 2022-10-03 HISTORY — DX: Other specified symptoms and signs involving the digestive system and abdomen: R19.8

## 2022-10-03 LAB — BASIC METABOLIC PANEL
BUN: 19 mg/dL (ref 6–23)
CO2: 31 mEq/L (ref 19–32)
Calcium: 9.3 mg/dL (ref 8.4–10.5)
Chloride: 98 mEq/L (ref 96–112)
Creatinine, Ser: 0.8 mg/dL (ref 0.40–1.20)
GFR: 86.74 mL/min (ref >60.00)
Glucose, Bld: 92 mg/dL (ref 70–99)
Potassium: 4 mEq/L (ref 3.5–5.1)
Sodium: 136 mEq/L (ref 135–145)

## 2022-10-03 LAB — HEMOGLOBIN A1C: Hgb A1c MFr Bld: 5.8 % (ref 4.6–6.5)

## 2022-10-03 MED ORDER — OMEPRAZOLE 40 MG PO CPDR: 40.0000 mg | DELAYED_RELEASE_CAPSULE | Freq: Every day | ORAL | 1 refills | Status: DC

## 2022-10-03 NOTE — Assessment & Plan Note (Addendum)
Pain is not adequately controlled. On tramadol,Duloxetine, Celebrex,and gabapentin. We briefly discussed a few side effects as well as the risk of med interaction. Following with pain management.

## 2022-10-03 NOTE — Assessment & Plan Note (Signed)
Still having polyarthralgia, currently on methotrexate and Plaquenil, the former one recently increased. Following with rheumatologist.

## 2022-10-03 NOTE — Assessment & Plan Note (Signed)
Stable for years. Reports having colonoscopy done in 2013, otherwise negative. ?  IBS. Continue Bentyl and Colace as needed for diarrhea and constipation respectively.

## 2022-10-03 NOTE — Assessment & Plan Note (Signed)
Problem is well-controlled with omeprazole 40 mg daily, so no changes today. Continue GERD precautions.

## 2022-10-03 NOTE — Patient Instructions (Addendum)
A few things to remember from today's visit:  Colon cancer screening - Plan: Ambulatory referral to Gastroenterology  Rheumatoid arthritis of both wrists, unspecified whether rheumatoid factor present ([place])  Essential (primary) hypertension - Plan: Basic metabolic panel  Elevated glucose level - Plan: Basic metabolic panel, Hemoglobin A1c  No changes today. Given the fact you are seeing different specialist and having labs regularly, I can see you annually, before if needed.  If you need refills for medications you take chronically, please call your pharmacy. Do not use My Chart to request refills or for acute issues that need immediate attention. If you send a my chart message, it may take a few days to be addressed, specially if I am not in the office.  Please be sure medication list is accurate. If a new problem present, please set up appointment sooner than planned today.

## 2022-10-03 NOTE — Assessment & Plan Note (Addendum)
Still has episodes of wheezing. Today negative lung auscultation. She is on Breztri 160-9-4 0.8 mcg 2 puff daily, albuterol neb treatments, and Pulmicort neb treatments 3 times daily as needed. Follows with immunologist.

## 2022-10-03 NOTE — Assessment & Plan Note (Addendum)
BP adequately controlled. Continue losartan 25 mg daily and low salt diet. Eye exam is current. She follows with other providers regularly, where BP and labs are done regularly, so it is appropriate to continue following annually, before if needed.

## 2022-10-04 DIAGNOSIS — R42 Dizziness and giddiness: Secondary | ICD-10-CM | POA: Diagnosis not present

## 2022-10-05 DIAGNOSIS — M5416 Radiculopathy, lumbar region: Secondary | ICD-10-CM | POA: Diagnosis not present

## 2022-10-05 DIAGNOSIS — M5412 Radiculopathy, cervical region: Secondary | ICD-10-CM | POA: Diagnosis not present

## 2022-10-07 NOTE — Assessment & Plan Note (Signed)
Mild, TG 162 in 01/2022. She is not fasting today. It can be repeated in a year. The 10-year ASCVD risk score (Arnett DK, et al., 2019) is: 1.2%   Values used to calculate the score:     Age: 47 years     Sex: Female     Is Non-Hispanic African American: No     Diabetic: No     Tobacco smoker: No     Systolic Blood Pressure: 123456 mmHg     Is BP treated: Yes     HDL Cholesterol: 51 mg/dL     Total Cholesterol: 171 mg/dL

## 2022-10-12 ENCOUNTER — Other Ambulatory Visit: Payer: Self-pay

## 2022-10-12 ENCOUNTER — Encounter: Payer: Self-pay | Admitting: Allergy

## 2022-10-12 ENCOUNTER — Ambulatory Visit: Payer: 59 | Admitting: Allergy

## 2022-10-12 VITALS — BP 118/74 | HR 78 | Temp 98.2°F | Resp 16 | Wt 203.1 lb

## 2022-10-12 DIAGNOSIS — J4540 Moderate persistent asthma, uncomplicated: Secondary | ICD-10-CM | POA: Diagnosis not present

## 2022-10-12 DIAGNOSIS — J309 Allergic rhinitis, unspecified: Secondary | ICD-10-CM | POA: Diagnosis not present

## 2022-10-12 DIAGNOSIS — J3089 Other allergic rhinitis: Secondary | ICD-10-CM

## 2022-10-12 MED ORDER — BREZTRI AEROSPHERE 160-9-4.8 MCG/ACT IN AERO: 2.0000 | INHALATION_SPRAY | Freq: Two times a day (BID) | RESPIRATORY_TRACT | 1 refills | Status: DC

## 2022-10-12 MED ORDER — AZELASTINE HCL 0.1 % NA SOLN: NASAL | 1 refills | Status: DC

## 2022-10-12 MED ORDER — ALBUTEROL SULFATE HFA 108 (90 BASE) MCG/ACT IN AERS: 2.0000 | INHALATION_SPRAY | RESPIRATORY_TRACT | 1 refills | Status: DC | PRN

## 2022-10-12 MED ORDER — AIRSUPRA 90-80 MCG/ACT IN AERO: 2.0000 | INHALATION_SPRAY | RESPIRATORY_TRACT | 1 refills | Status: DC | PRN

## 2022-10-12 MED ORDER — MOMETASONE FUROATE 50 MCG/ACT NA SUSP: NASAL | 1 refills | Status: DC

## 2022-10-12 MED ORDER — AUVI-Q 0.3 MG/0.3ML IJ SOAJ: 0.3000 mg | INTRAMUSCULAR | 1 refills | Status: AC | PRN

## 2022-10-12 NOTE — Progress Notes (Signed)
Follow-up Note  RE: Anne Jefferson MRN: [id number] DOB: 11/27/1975 Date of Office Visit: 10/12/2022   History of present illness: Anne Jefferson is a 47 y.o. female presenting today for follow-up of asthma, allergic rhinitis.  She was last seen in the office on 07/05/22 by myself.   She states she had recovery of her symptoms related to upper respiratory illness after her last visit.  She does not require any further prednisone burst or increase visit. In regards to her asthma she does continue on Breztri 2 puffs twice a day with spacer device she also will use air supra or albuterol.  She has not needed to use either these much since the last visit.  She states she is noting some white areas at the base of the tongue that she is somewhat concerned about.  She states that it does not really look like thrush she is not sure what the cause is. In regards to allergies she continues on immunotherapy tolerating injections time.  States doing the epiwash has been helpful in minimizing the large local reactions she was having with her and is in the maintenance dosing.  She continues to states she has noted improvement in her symptoms since she has on immunotherapy for this round.  She will use Allegra as well as Nasonex and azelastine for nasal symptoms. She will be having an upcoming eye surgery to help correct astigmatism.   Review of systems in the past 4 weeks: Review of Systems  Constitutional: Negative.   HENT: Negative.    Eyes: Negative.   Respiratory: Negative.    Cardiovascular: Negative.   Gastrointestinal: Negative.   Musculoskeletal: Negative.   Skin: Negative.   Allergic/Immunologic: Negative.   Neurological: Negative.      All other systems negative unless noted above in HPI  Past medical/social/surgical/family history have been reviewed and are unchanged unless specifically indicated below.  No changes  Medication List: Current Outpatient Medications   Medication Sig Dispense Refill   albuterol (PROVENTIL) (2.5 MG/3ML) 0.083% nebulizer solution Use 1 unit dose via the nebulizer every 6 hours as needed for cough, wheeze, tightness in chest, or shortness of breath. 75 mL 3   AUVI-Q 0.3 MG/0.3ML SOAJ injection Inject 0.3 mg into the muscle as needed for anaphylaxis. 1 each 1   azelastine (ASTELIN) 0.1 % nasal spray 1-2 sprays each nostril twice daily for drainage control. 90 mL 1   celecoxib (CELEBREX) 100 MG capsule Take 100 mg by mouth 2 (two) times daily.     DULoxetine (CYMBALTA) 30 MG capsule Take 90 mg by mouth daily.     EPINEPHrine (AUVI-Q) 0.3 mg/0.3 mL IJ SOAJ injection Inject 0.3 mg into the muscle as needed for anaphylaxis. 2 each 1   fexofenadine (ALLEGRA) 180 MG tablet Take 180 mg by mouth 2 (two) times daily.     folic acid (FOLVITE) 1 MG tablet Take 1 mg by mouth daily. Everyday but Thursdays     gabapentin (NEURONTIN) 300 MG capsule Take 300 mg by mouth 3 (three) times daily.     hydroxychloroquine (PLAQUENIL) 200 MG tablet Take 200 mg by mouth 2 (two) times daily.     losartan (COZAAR) 25 MG tablet Take 25 mg by mouth daily.     methotrexate (RHEUMATREX) 2.5 MG tablet Take 10 mg by mouth once a week. Caution:Chemotherapy. Protect from light. Takes on Thursdays     omeprazole (PRILOSEC) 40 MG capsule Take 1 capsule (40 mg total) by mouth daily before breakfast.  90 capsule 1   predniSONE (DELTASONE) 5 MG tablet Take 10 mg by mouth daily.     traMADol (ULTRAM) 50 MG tablet Take 100 mg by mouth 1 day or 1 dose.     UNABLE TO FIND 2 (two) times a week. Med Name: ALLERGY SHOTS     VITAMIN D PO Take by mouth daily.     albuterol (PROVENTIL HFA) 108 (90 Base) MCG/ACT inhaler Inhale 2 puffs into the lungs every 4 (four) hours as needed for wheezing or shortness of breath. 54 g 1   Albuterol-Budesonide (AIRSUPRA) 90-80 MCG/ACT AERO Inhale 2 puffs into the lungs every 4 (four) hours as needed (cough, wheeze, shortness of breath). 32.1 g 1    BREZTRI AEROSPHERE 160-9-4.8 MCG/ACT AERO Inhale 2 puffs into the lungs in the morning and at bedtime. 32.1 g 1   budesonide (PULMICORT) 0.5 MG/2ML nebulizer solution TAKE 2 MLS (0.5 MG TOTAL) BY NEBULIZATION 3 (THREE) TIMES DAILY AS NEEDED (ASTHMA FLARE). (Patient not taking: Reported on 10/12/2022) 540 mL 1   dicyclomine (BENTYL) 20 MG tablet Take 1 tablet (20 mg total) by mouth 2 (two) times daily as needed (abdominal spasms). (Patient not taking: Reported on 10/12/2022) 20 tablet 0   docusate sodium (COLACE) 100 MG capsule Take 1 capsule (100 mg total) by mouth every 12 (twelve) hours. (Patient not taking: Reported on 10/12/2022) 60 capsule 0   mometasone (NASONEX) 50 MCG/ACT nasal spray 1 spray each nostril twice daily for nasal congestion control. 51 g 1   predniSONE (DELTASONE) 20 MG tablet Taper as prescribed by rheumatology (Patient not taking: Reported on 10/12/2022)     No current facility-administered medications for this visit.     Known medication allergies: Allergies  Allergen Reactions   Promethazine Other (See Comments)    Hallucinations   Theophyllines Nausea And Vomiting   Mite (D. Farinae) Other (See Comments)    Cats, dogs, dust    Molds & Smuts Other (See Comments)     Physical examination: Blood pressure 118/74, pulse 78, temperature 98.2 F (36.8 C), temperature source Temporal, resp. rate 16, weight 203 lb 1.6 oz (92.1 kg), SpO2 94 %.  General: Alert, interactive, in no acute distress. HEENT: PERRLA, TMs pearly gray, turbinates mildly edematous without discharge, post-pharynx non erythematous,.  Base of tongue has some faintly white patchy areas. Neck: Supple without lymphadenopathy. Lungs: Clear to auscultation without wheezing, rhonchi or rales. {no increased work of breathing. CV: Normal S1, S2 without murmurs. Abdomen: Nondistended, nontender. Skin: Warm and dry, without lesions or rashes. Extremities:  No clubbing, cyanosis or edema. Neuro:   Grossly  intact.  Diagnositics/Labs: None today   Assessment and plan:   Asthma Allergic rhinitis  - Continue Breztri 2 puffs twice a day with a spacer to prevent cough or wheeze - Use AirSupra 2 puffs every 4-6 hours as needed (do not exceed more than 12 puffs/day) OR albuterol 2 puffs every 4-6 hours as needed for cough or wheeze or prior to activity.  -Continue to rinse mouth after inhaler use.  Tongue appearance at this time does not look like active thrush that warrants treatment.  Would continue to monitor.  Can ask dentist about tongue appears well.    Asthma control goals:  Full participation in all desired activities (may need albuterol before activity) Albuterol use two time or less a week on average (not counting use with activity) Cough interfering with sleep two time or less a month Oral steroids no more than once  a year No hospitalizations  - Continue allergen avoidance measures - Perform nasal saline rinses before nose sprays. Use distilled water.   - Continue Nasonex or Flonase 1 sprays each nostril twice daily for nasal congestion control.  Aim upward and outward toward eye on same side and lean a bit forward for best technique and decrease drainage into throat ("if you taste it you waste it"). - Use Azelastine 1-2 sprays each nostril twice daily for nasal drainage control. Aim upward and outward toward eye on same side and lean a bit forward for best technique and decrease drainage into throat ("if you taste it you waste it"). - Use Allegra 180mg  daily.  - Continue allergen immunotherapy at maintenance dosing per protocol and have access to an epinephrine device  Follow-up in 6 months  I appreciate the opportunity to take part in Makensey's care. Please do not hesitate to contact me with questions.  Sincerely,   Prudy Feeler, MD Allergy/Immunology [facility] [place] [facility]

## 2022-10-12 NOTE — Patient Instructions (Addendum)
Asthma Allergic rhinitis  - Continue Breztri 2 puffs twice a day with a spacer to prevent cough or wheeze - Use AirSupra 2 puffs every 4-6 hours as needed (do not exceed more than 12 puffs/day) OR albuterol 2 puffs every 4-6 hours as needed for cough or wheeze or prior to activity.  -Continue to rinse mouth after inhaler use.  Tongue appearance at this time does not look like active thrush that warrants treatment.  Would continue to monitor.  Can ask dentist about tongue appears well.    Asthma control goals:  Full participation in all desired activities (may need albuterol before activity) Albuterol use two time or less a week on average (not counting use with activity) Cough interfering with sleep two time or less a month Oral steroids no more than once a year No hospitalizations  - Perform nasal saline rinses before nose sprays. Use distilled water.   - Continue Nasonex or Flonase 1 sprays each nostril twice daily for nasal congestion control.  Aim upward and outward toward eye on same side and lean a bit forward for best technique and decrease drainage into throat ("if you taste it you waste it"). - Use Azelastine 1-2 sprays each nostril twice daily for nasal drainage control. Aim upward and outward toward eye on same side and lean a bit forward for best technique and decrease drainage into throat ("if you taste it you waste it"). - Use Allegra 180mg  daily.  - Continue allergen immunotherapy per protocol and have access to an epinephrine device  Follow-up in 6 months

## 2022-10-13 DIAGNOSIS — M5416 Radiculopathy, lumbar region: Secondary | ICD-10-CM | POA: Diagnosis not present

## 2022-10-13 DIAGNOSIS — M5412 Radiculopathy, cervical region: Secondary | ICD-10-CM | POA: Diagnosis not present

## 2022-10-17 DIAGNOSIS — J3089 Other allergic rhinitis: Secondary | ICD-10-CM | POA: Diagnosis not present

## 2022-10-17 NOTE — Progress Notes (Signed)
VIALS EXP 10-17-23

## 2022-10-18 ENCOUNTER — Telehealth: Payer: Self-pay | Admitting: Family Medicine

## 2022-10-18 MED ORDER — LOSARTAN POTASSIUM 25 MG PO TABS: 25.0000 mg | ORAL_TABLET | Freq: Every day | ORAL | 2 refills | Status: DC

## 2022-10-18 NOTE — Telephone Encounter (Signed)
Rx sent in

## 2022-10-18 NOTE — Telephone Encounter (Signed)
Prescription Request  10/18/2022  LOV: 10/03/2022  What is the name of the medication or equipment?  losartan (COZAAR) 25 MG tablet  Have you contacted your pharmacy to request a refill? Yes   Which pharmacy would you like this sent to?  [id number] - [place], [name] - [address] [place] [address] Phone: [phone] Fax: [phone]     Patient notified that their request is being sent to the clinical staff for review and that they should receive a response within 2 business days.   Please advise at Mobile [phone] (mobile)

## 2022-10-23 DIAGNOSIS — M5412 Radiculopathy, cervical region: Secondary | ICD-10-CM | POA: Diagnosis not present

## 2022-10-23 HISTORY — PX: STRABISMUS SURGERY: SHX218

## 2022-10-25 ENCOUNTER — Telehealth: Payer: Self-pay | Admitting: Family Medicine

## 2022-10-25 NOTE — Telephone Encounter (Signed)
She is already taking a high dose Duloxetine, 90 mg, which I think has been prescribed mainly for musculoskeletal pain. My recommendation will be to establish with psychiatrist. Thanks, BJ

## 2022-10-25 NOTE — Telephone Encounter (Signed)
She cannot have lexapro because she is on Duloxetine. Thanks, BJ

## 2022-10-25 NOTE — Telephone Encounter (Signed)
Patient states she is taking the highest cymbalta and it is not helping with her depression. Says it was discussed at her last appointment in March. Requesting another medication.

## 2022-10-25 NOTE — Telephone Encounter (Signed)
I spoke with the patient and informed her of the message below. I informed that patient that PCP informed her to establish with a psychiatrist but patient wanted message sent back to see if PCP is willing to send in Lexapro for her as she states she already is seeing a lot of specialist and would like for PCP to prescribe medication.

## 2022-10-26 NOTE — Telephone Encounter (Signed)
Patient informed of the message below. Virtual visit scheduled

## 2022-10-27 ENCOUNTER — Encounter: Payer: Self-pay | Admitting: Family Medicine

## 2022-10-27 ENCOUNTER — Telehealth (INDEPENDENT_AMBULATORY_CARE_PROVIDER_SITE_OTHER): Payer: 59 | Admitting: Family Medicine

## 2022-10-27 VITALS — BP 122/80 | HR 74 | Ht 63.0 in | Wt 203.1 lb

## 2022-10-27 DIAGNOSIS — H518 Other specified disorders of binocular movement: Secondary | ICD-10-CM | POA: Diagnosis not present

## 2022-10-27 DIAGNOSIS — M2550 Pain in unspecified joint: Secondary | ICD-10-CM

## 2022-10-27 DIAGNOSIS — G8929 Other chronic pain: Secondary | ICD-10-CM

## 2022-10-27 DIAGNOSIS — H4912 Fourth [trochlear] nerve palsy, left eye: Secondary | ICD-10-CM | POA: Diagnosis not present

## 2022-10-27 DIAGNOSIS — F331 Major depressive disorder, recurrent, moderate: Secondary | ICD-10-CM

## 2022-10-27 MED ORDER — ESCITALOPRAM OXALATE 10 MG PO TABS: 10.0000 mg | ORAL_TABLET | Freq: Every day | ORAL | 0 refills | Status: DC

## 2022-10-27 NOTE — Progress Notes (Unsigned)
Virtual Visit via Video Note I connected with Anne Jefferson on 10/28/22 by a video enabled telemedicine application and verified that I am speaking with the correct person using two identifiers. Location patient: In her car, she is not driving. Location provider:work office Persons participating in the virtual visit: patient, provider  I discussed the limitations of evaluation and management by telemedicine and the availability of in person appointments. The patient expressed understanding and agreed to proceed.  Chief Complaint  Patient presents with   Medication Consultation   HPI: Anne Jefferson is a 47 yo female with PMHx significant for HTN,chronic pain disorder,IBS-C/D,GERD,asthma,,and anxiety reporting worsening depression and anxiety symptoms. I saw her on 10/03/22, when she established care. Today she had eye surgery on the left eye.  She is currently on Duloxetine 90 mg daily, which has been prescribed to help with anxiety and depression but also for generalized chronic pain (polyarthralgia,back pain,and generalized myalgias). She would like to try Lexapro. She called with this request and due to her complex medical hx and multiple medications, I recommended establishing with psychiatrist. She expresses reservations about expanding her care team with additional specialists, as she is already under the care of several, and wishes to manage her medication changes without the need to involve a psychiatrist. She conveys emotional distress, mentioning that despite the assistance from Cymbalta, she still suffers from daily crying episodes that are challenging to manage. She is actively engaged in psychotherapy, attending sessions once or twice a week.  Problem has been aggravated by some of her chronic medical problems.     10/03/2022    8:52 AM  Depression screen PHQ 2/9  Decreased Interest 1  Down, Depressed, Hopeless 3  PHQ - 2 Score 4  Altered sleeping 2  Tired,  decreased energy 2  Change in appetite 2  Feeling bad or failure about yourself  1  Trouble concentrating 0  Moving slowly or fidgety/restless 0  Suicidal thoughts 0  PHQ-9 Score 11  Difficult doing work/chores Somewhat difficult   Cervical and lumbar radiculopathy, has received epidural injections. She is also on Gabapentin 300 mg bid.  She is also on Tramadol, taking 100 mg twice daily and Celebrex 100 mg bid for pain management. Pain is affecting her sleep,ranging from five to seven hours, with disturbances due to pain spells that awaken her early in the morning. She follows with pain management, seen recently and has a f/u appt in a few days.  Has been dx'd with RA, she follows with rheumatologist, on Methotrexate , medication caused facial sores, which resolved after stopping the medication prior to  surgery. She intends to resume Methotrexate and folic acid following her eye surgery and according to pt, the  plan is to start biologic agent in June. She is also on Plaquenil.  Negative for fever,sore throat, CP, unusual abdominal pain,urinary symptoms,focal weakness,or syncope. + Fatigue.  Today she had eye surgery on the left eye.  ROS: See pertinent positives and negatives per HPI.  Past Medical History:  Diagnosis Date   Abnormal Pap smear    Anxiety    no meds   Asthma    mild, no issue for years no  inhaler use except in winter   Biliary colic    Blood type, Rh negative    Gallstones    H/O dysmenorrhea    Infertility, female    Nausea and vomiting    Pain    back and chest realted to gallbladder since november 2018  PONV (postoperative nausea and vomiting)    severe, needs heavy anti nausea meds   Spinal stenosis     Past Surgical History:  Procedure Laterality Date    c sections     x 2  3 births last was twins   BUNIONECTOMY Right    CHOLECYSTECTOMY N/A 11/22/2017   Procedure: LAPAROSCOPIC CHOLECYSTECTOMY ERAS PATHWAY;  Surgeon: Rodman Pickle,  MD;  Location: [facility];  Service: General;  Laterality: N/A;   LEEP  12/23/2009   WISDOM TOOTH EXTRACTION      Family History  Problem Relation Age of Onset   Allergic rhinitis Mother    Osteoarthritis Mother    Healthy Brother    Healthy Brother    Diabetes Maternal Grandmother    Skin cancer Maternal Grandmother    Celiac disease Son    Autoimmune disease Son    Anemia Son    Diabetes Son        borderline    Social History   Socioeconomic History   Marital status: Divorced    Spouse name: Not on file   Number of children: 1   Years of education: Not on file   Highest education level: Master's degree (e.g., MA, MS, MEng, MEd, MSW, MBA)  Occupational History    Comment: Doctor, 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  Tobacco Use   Smoking status: Never   Smokeless tobacco: Never  Vaping Use   Vaping Use: Never used  Substance and Sexual Activity   Alcohol use: Yes    Comment: wine occ   Drug use: No   Sexual activity: Yes    Birth control/protection: None  Other Topics Concern   Not on file  Social History Narrative   Not on file   Social Determinants of Health   Financial Resource Strain: Not on file  Food Insecurity: Not on file  Transportation Needs: Not on file  Physical Activity: Not on file  Stress: Not on file  Social Connections: Not on file  Intimate Partner Violence: Not on file    Current Outpatient Medications:    albuterol (PROVENTIL HFA) 108 (90 Base) MCG/ACT inhaler, Inhale 2 puffs into the lungs every 4 (four) hours as needed for wheezing or shortness of breath., Disp: 54 g, Rfl: 1   albuterol (PROVENTIL) (2.5 MG/3ML) 0.083% nebulizer solution, Use 1 unit dose via the nebulizer every 6 hours as needed for cough, wheeze, tightness in chest, or shortness of breath., Disp: 75 mL, Rfl: 3   Albuterol-Budesonide (AIRSUPRA) 90-80 MCG/ACT AERO, Inhale 2 puffs into the lungs every 4 (four) hours as needed (cough, wheeze, shortness of breath)., Disp: 32.1 g, Rfl: 1    AUVI-Q 0.3 MG/0.3ML SOAJ injection, Inject 0.3 mg into the muscle as needed for anaphylaxis., Disp: 1 each, Rfl: 1   azelastine (ASTELIN) 0.1 % nasal spray, 1-2 sprays each nostril twice daily for drainage control., Disp: 90 mL, Rfl: 1   BREZTRI AEROSPHERE 160-9-4.8 MCG/ACT AERO, Inhale 2 puffs into the lungs in the morning and at bedtime., Disp: 32.1 g, Rfl: 1   budesonide (PULMICORT) 0.5 MG/2ML nebulizer solution, TAKE 2 MLS (0.5 MG TOTAL) BY NEBULIZATION 3 (THREE) TIMES DAILY AS NEEDED (ASTHMA FLARE)., Disp: 540 mL, Rfl: 1   celecoxib (CELEBREX) 100 MG capsule, Take 100 mg by mouth 2 (two) times daily., Disp: , Rfl:    dicyclomine (BENTYL) 20 MG tablet, Take 1 tablet (20 mg total) by mouth 2 (two) times daily as needed (abdominal spasms)., Disp: 20 tablet, Rfl: 0  docusate sodium (COLACE) 100 MG capsule, Take 1 capsule (100 mg total) by mouth every 12 (twelve) hours., Disp: 60 capsule, Rfl: 0   DULoxetine (CYMBALTA) 30 MG capsule, Take 90 mg by mouth daily., Disp: , Rfl:    EPINEPHrine (AUVI-Q) 0.3 mg/0.3 mL IJ SOAJ injection, Inject 0.3 mg into the muscle as needed for anaphylaxis., Disp: 2 each, Rfl: 1   escitalopram (LEXAPRO) 10 MG tablet, Take 1 tablet (10 mg total) by mouth daily. To start when Cymbalta is down to 30 mg., Disp: 30 tablet, Rfl: 0   fexofenadine (ALLEGRA) 180 MG tablet, Take 180 mg by mouth 2 (two) times daily., Disp: , Rfl:    folic acid (FOLVITE) 1 MG tablet, Take 1 mg by mouth daily. Everyday but Thursdays, Disp: , Rfl:    gabapentin (NEURONTIN) 300 MG capsule, Take 300 mg by mouth 3 (three) times daily., Disp: , Rfl:    hydroxychloroquine (PLAQUENIL) 200 MG tablet, Take 200 mg by mouth 2 (two) times daily., Disp: , Rfl:    losartan (COZAAR) 25 MG tablet, Take 1 tablet (25 mg total) by mouth daily., Disp: 90 tablet, Rfl: 2   methotrexate (RHEUMATREX) 2.5 MG tablet, Take 10 mg by mouth once a week. Caution:Chemotherapy. Protect from light. Takes on Thursdays, Disp: , Rfl:     mometasone (NASONEX) 50 MCG/ACT nasal spray, 1 spray each nostril twice daily for nasal congestion control., Disp: 51 g, Rfl: 1   omeprazole (PRILOSEC) 40 MG capsule, Take 1 capsule (40 mg total) by mouth daily before breakfast., Disp: 90 capsule, Rfl: 1   predniSONE (DELTASONE) 20 MG tablet, Taper as prescribed by rheumatology, Disp: , Rfl:    predniSONE (DELTASONE) 5 MG tablet, Take 10 mg by mouth daily., Disp: , Rfl:    traMADol (ULTRAM) 50 MG tablet, Take 100 mg by mouth 1 day or 1 dose., Disp: , Rfl:    UNABLE TO FIND, 2 (two) times a week. Med Name: ALLERGY SHOTS, Disp: , Rfl:    VITAMIN D PO, Take by mouth daily., Disp: , Rfl:   EXAM:  VITALS per patient if applicable:  GENERAL: alert, oriented, appears well and in no acute distress  HEENT: atraumatic, conjunctiva clear, no obvious abnormalities on inspection of external nose and ears  NECK: normal movements of the head and neck  LUNGS: on inspection no signs of respiratory distress, breathing rate appears normal, no obvious gross SOB, gasping or wheezing  CV: no obvious cyanosis  MS: moves all visible extremities without noticeable abnormality  PSYCH/NEURO: pleasant and cooperative, no obvious depression or anxiety, speech and thought processing grossly intact  ASSESSMENT AND PLAN:  Discussed the following assessment and plan:  Chronic generalized pain disorder Pain is not well controlled. She is on Tramadol 200 mg daily and Celebrex. Follows with pain management. We discussed benefits of Duloxetine and the possibility of pain getting even worse while weaning medication off.  Polyarthralgia She has been Dx'ed with RA, not sure if Methotrexate or Plaquenil are helping. Planning on starting a new medication in 12/2022. Follows with rheumatologist.  Major depressive disorder, recurrent episode, moderate Anxiety and depression aggravated by some of her chronic health issues, most reported as not well controlled. She  would like to try a different med,specifically Lexapro. She has been on Duloxetine for about 2 years.Before starting Lexapro she will need to start weaning off Duloxetine. We discussed possible complications, she is not sure now if she wants to start weaning process, will thinks about it.  If she decides to switch, taper off Cymbalta as follows: 90-60-90-60 mg for one week, 60 mg daily for one week, 60-30-60-30 mg for one week, and then 30 mg daily. Start Lexapro 10 mg daily once Cymbalta is down to 30 mg daily. Monitor for any withdrawal symptoms or worsening anxiety during the tapering process.  Follow up in 6 weeks to assess tolerance of Lexapro and provide further instructions on tapering off Cymbalta.  -     Escitalopram Oxalate; Take 1 tablet (10 mg total) by mouth daily. To start when Cymbalta is down to 30 mg.  Dispense: 30 tablet; Refill: 0  We discussed possible serious and likely etiologies, options for evaluation and workup, limitations of telemedicine visit vs in person visit, treatment, treatment risks and precautions. The patient was advised to call back or seek an in-person evaluation if the symptoms worsen or if the condition fails to improve as anticipated. I discussed the assessment and treatment plan with the patient. The patient was provided an opportunity to ask questions and all were answered. The patient agreed with the plan and demonstrated an understanding of the instructions.  Return in about 6 weeks (around 12/08/2022) for chronic problems.  Hutchinson Isenberg [place], MD

## 2022-11-03 DIAGNOSIS — M5412 Radiculopathy, cervical region: Secondary | ICD-10-CM | POA: Diagnosis not present

## 2022-11-03 DIAGNOSIS — M5416 Radiculopathy, lumbar region: Secondary | ICD-10-CM | POA: Diagnosis not present

## 2022-11-06 ENCOUNTER — Ambulatory Visit (INDEPENDENT_AMBULATORY_CARE_PROVIDER_SITE_OTHER): Payer: 59 | Admitting: *Deleted

## 2022-11-06 ENCOUNTER — Other Ambulatory Visit: Payer: Self-pay | Admitting: Family Medicine

## 2022-11-06 ENCOUNTER — Telehealth: Payer: Self-pay | Admitting: Family Medicine

## 2022-11-06 DIAGNOSIS — J309 Allergic rhinitis, unspecified: Secondary | ICD-10-CM | POA: Diagnosis not present

## 2022-11-06 MED ORDER — DULOXETINE HCL 60 MG PO CPEP: 60.0000 mg | ORAL_CAPSULE | Freq: Every day | ORAL | 0 refills | Status: DC

## 2022-11-06 NOTE — Telephone Encounter (Signed)
Pt is weaning out of cymbalta 30 mg and 60 mg and she does not wean off to low before she starts new escitalopram (LEXAPRO) 10 MG tablet . Pt would like to have a refill on cymbalta 60 mg  . Pt will be leaving town on Wednesday morning. Please advise

## 2022-11-06 NOTE — Telephone Encounter (Signed)
Rx for Duloxetine 60 mg sent. Thanks, BJ

## 2022-11-14 DIAGNOSIS — M0609 Rheumatoid arthritis without rheumatoid factor, multiple sites: Secondary | ICD-10-CM | POA: Diagnosis not present

## 2022-11-14 DIAGNOSIS — R768 Other specified abnormal immunological findings in serum: Secondary | ICD-10-CM | POA: Diagnosis not present

## 2022-11-14 DIAGNOSIS — M25529 Pain in unspecified elbow: Secondary | ICD-10-CM | POA: Diagnosis not present

## 2022-11-14 DIAGNOSIS — M25522 Pain in left elbow: Secondary | ICD-10-CM | POA: Diagnosis not present

## 2022-11-14 DIAGNOSIS — M2550 Pain in unspecified joint: Secondary | ICD-10-CM | POA: Diagnosis not present

## 2022-11-14 DIAGNOSIS — Z79899 Other long term (current) drug therapy: Secondary | ICD-10-CM | POA: Diagnosis not present

## 2022-11-15 DIAGNOSIS — R42 Dizziness and giddiness: Secondary | ICD-10-CM | POA: Diagnosis not present

## 2022-11-16 DIAGNOSIS — M5416 Radiculopathy, lumbar region: Secondary | ICD-10-CM | POA: Diagnosis not present

## 2022-11-16 DIAGNOSIS — M5412 Radiculopathy, cervical region: Secondary | ICD-10-CM | POA: Diagnosis not present

## 2022-12-19 ENCOUNTER — Ambulatory Visit (INDEPENDENT_AMBULATORY_CARE_PROVIDER_SITE_OTHER): Payer: 59 | Admitting: *Deleted

## 2022-12-19 DIAGNOSIS — J309 Allergic rhinitis, unspecified: Secondary | ICD-10-CM

## 2022-12-22 ENCOUNTER — Telehealth: Payer: Self-pay | Admitting: Family Medicine

## 2022-12-22 DIAGNOSIS — K219 Gastro-esophageal reflux disease without esophagitis: Secondary | ICD-10-CM

## 2022-12-22 NOTE — Telephone Encounter (Signed)
FYI Pt is calling to let md know on 12-27-2022 she will be starting RA infusion simponi Jarold Song

## 2022-12-22 NOTE — Telephone Encounter (Signed)
Pt is calling and would like to increase omeprazole (PRILOSEC) 40 MG capsule to two pill a day instead of one pill  [id number] - [place], [place] - [address] Phone: [phone]  Fax: [phone]

## 2022-12-27 DIAGNOSIS — M0609 Rheumatoid arthritis without rheumatoid factor, multiple sites: Secondary | ICD-10-CM | POA: Diagnosis not present

## 2023-01-02 MED ORDER — OMEPRAZOLE 40 MG PO CPDR: 40.0000 mg | DELAYED_RELEASE_CAPSULE | Freq: Two times a day (BID) | ORAL | 1 refills | Status: DC

## 2023-01-02 NOTE — Telephone Encounter (Signed)
She can try increasing omeprazole dose as requested, from 40 mg daily to twice daily 30 minutes before meals for 6-8 weeks.  If symptoms are persistent, the next step will be GI consultation. Thanks, BJ

## 2023-01-02 NOTE — Telephone Encounter (Signed)
Rx sent in

## 2023-01-05 ENCOUNTER — Ambulatory Visit (INDEPENDENT_AMBULATORY_CARE_PROVIDER_SITE_OTHER): Payer: 59

## 2023-01-05 DIAGNOSIS — J309 Allergic rhinitis, unspecified: Secondary | ICD-10-CM | POA: Diagnosis not present

## 2023-01-08 NOTE — Progress Notes (Unsigned)
ACUTE VISIT No chief complaint on file.  HPI: [name] is a 47 y.o. female, who is here today complaining of *** HPI  Review of Systems See other pertinent positives and negatives in HPI.  Current Outpatient Medications on File Prior to Visit  Medication Sig Dispense Refill   albuterol (PROVENTIL HFA) 108 (90 Base) MCG/ACT inhaler Inhale 2 puffs into the lungs every 4 (four) hours as needed for wheezing or shortness of breath. 54 g 1   albuterol (PROVENTIL) (2.5 MG/3ML) 0.083% nebulizer solution Use 1 unit dose via the nebulizer every 6 hours as needed for cough, wheeze, tightness in chest, or shortness of breath. 75 mL 3   Albuterol-Budesonide (AIRSUPRA) 90-80 MCG/ACT AERO Inhale 2 puffs into the lungs every 4 (four) hours as needed (cough, wheeze, shortness of breath). 32.1 g 1   AUVI-Q 0.3 MG/0.3ML SOAJ injection Inject 0.3 mg into the muscle as needed for anaphylaxis. 1 each 1   azelastine (ASTELIN) 0.1 % nasal spray 1-2 sprays each nostril twice daily for drainage control. 90 mL 1   BREZTRI AEROSPHERE 160-9-4.8 MCG/ACT AERO Inhale 2 puffs into the lungs in the morning and at bedtime. 32.1 g 1   budesonide (PULMICORT) 0.5 MG/2ML nebulizer solution TAKE 2 MLS (0.5 MG TOTAL) BY NEBULIZATION 3 (THREE) TIMES DAILY AS NEEDED (ASTHMA FLARE). 540 mL 1   celecoxib (CELEBREX) 100 MG capsule Take 100 mg by mouth 2 (two) times daily.     dicyclomine (BENTYL) 20 MG tablet Take 1 tablet (20 mg total) by mouth 2 (two) times daily as needed (abdominal spasms). 20 tablet 0   docusate sodium (COLACE) 100 MG capsule Take 1 capsule (100 mg total) by mouth every 12 (twelve) hours. 60 capsule 0   DULoxetine (CYMBALTA) 30 MG capsule Take 90 mg by mouth daily.     DULoxetine (CYMBALTA) 60 MG capsule Take 1 capsule (60 mg total) by mouth daily. 90 capsule 0   EPINEPHrine (AUVI-Q) 0.3 mg/0.3 mL IJ SOAJ injection Inject 0.3 mg into the muscle as needed for anaphylaxis. 2 each 1   escitalopram  (LEXAPRO) 10 MG tablet Take 1 tablet (10 mg total) by mouth daily. To start when Cymbalta is down to 30 mg. 30 tablet 0   fexofenadine (ALLEGRA) 180 MG tablet Take 180 mg by mouth 2 (two) times daily.     folic acid (FOLVITE) 1 MG tablet Take 1 mg by mouth daily. Everyday but Thursdays     gabapentin (NEURONTIN) 300 MG capsule Take 300 mg by mouth 3 (three) times daily.     hydroxychloroquine (PLAQUENIL) 200 MG tablet Take 200 mg by mouth 2 (two) times daily.     losartan (COZAAR) 25 MG tablet Take 1 tablet (25 mg total) by mouth daily. 90 tablet 2   methotrexate (RHEUMATREX) 2.5 MG tablet Take 10 mg by mouth once a week. Caution:Chemotherapy. Protect from light. Takes on Thursdays     mometasone (NASONEX) 50 MCG/ACT nasal spray 1 spray each nostril twice daily for nasal congestion control. 51 g 1   omeprazole (PRILOSEC) 40 MG capsule Take 1 capsule (40 mg total) by mouth 2 (two) times daily. 180 capsule 1   predniSONE (DELTASONE) 20 MG tablet Taper as prescribed by rheumatology     predniSONE (DELTASONE) 5 MG tablet Take 10 mg by mouth daily.     traMADol (ULTRAM) 50 MG tablet Take 100 mg by mouth 1 day or 1 dose.     UNABLE TO FIND 2 (two) times  a week. Med Name: ALLERGY SHOTS     VITAMIN D PO Take by mouth daily.     No current facility-administered medications on file prior to visit.    Past Medical History:  Diagnosis Date   Abnormal Pap smear    Anxiety    no meds   Asthma    mild, no issue for years no  inhaler use except in winter   Biliary colic    Blood type, Rh negative    Gallstones    H/O dysmenorrhea    Infertility, female    Nausea and vomiting    Pain    back and chest realted to gallbladder since november 2018   PONV (postoperative nausea and vomiting)    severe, needs heavy anti nausea meds   Spinal stenosis    Allergies  Allergen Reactions   Promethazine Other (See Comments)    Hallucinations   Theophyllines Nausea And Vomiting   Mite (D. 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 There is no height or weight on file to calculate BMI.  Physical Exam  ASSESSMENT AND PLAN: There are no diagnoses linked to this encounter.  No follow-ups on file.  Bertrice Leder G. [place], MD  [facility]. [place] office.  Discharge Instructions   None

## 2023-01-09 ENCOUNTER — Encounter: Payer: Self-pay | Admitting: Family Medicine

## 2023-01-09 ENCOUNTER — Ambulatory Visit (INDEPENDENT_AMBULATORY_CARE_PROVIDER_SITE_OTHER): Payer: 59 | Admitting: Family Medicine

## 2023-01-09 VITALS — BP 126/80 | HR 100 | Temp 98.5°F | Resp 16 | Ht 63.0 in | Wt 208.0 lb

## 2023-01-09 DIAGNOSIS — K219 Gastro-esophageal reflux disease without esophagitis: Secondary | ICD-10-CM | POA: Diagnosis not present

## 2023-01-09 DIAGNOSIS — R202 Paresthesia of skin: Secondary | ICD-10-CM

## 2023-01-09 DIAGNOSIS — I10 Essential (primary) hypertension: Secondary | ICD-10-CM

## 2023-01-09 DIAGNOSIS — I8393 Asymptomatic varicose veins of bilateral lower extremities: Secondary | ICD-10-CM | POA: Diagnosis not present

## 2023-01-09 DIAGNOSIS — F331 Major depressive disorder, recurrent, moderate: Secondary | ICD-10-CM

## 2023-01-09 DIAGNOSIS — R200 Anesthesia of skin: Secondary | ICD-10-CM | POA: Diagnosis not present

## 2023-01-09 DIAGNOSIS — R7989 Other specified abnormal findings of blood chemistry: Secondary | ICD-10-CM

## 2023-01-09 HISTORY — DX: Major depressive disorder, recurrent, moderate: F33.1

## 2023-01-09 LAB — T3, FREE: T3, Free: 3.2 pg/mL (ref 2.3–4.2)

## 2023-01-09 LAB — T4, FREE: Free T4: 0.67 ng/dL (ref 0.60–1.60)

## 2023-01-09 LAB — TSH: TSH: 1.43 u[IU]/mL (ref 0.35–5.50)

## 2023-01-09 NOTE — Assessment & Plan Note (Signed)
She decided to continue with Duloxetine 90 mg daily instead trying Lexapro. She reports that problem is stable and has no concerns in this regard today.

## 2023-01-09 NOTE — Assessment & Plan Note (Signed)
BP adequately controlled. Continue losartan 25 mg daily and low-salt diet. 

## 2023-01-09 NOTE — Assessment & Plan Note (Addendum)
Symptoms have improved but not completely resolved. She feels like taking Omeprazole 40 mg bid greatly helps. We discussed possible PPI side effects. She is not interested in changing to a different PPI. She can continue Omeprazole 40 mg am and prn at night. Continue GERD precautions. If problem is not better controlled GI consultation will be recommended.

## 2023-01-09 NOTE — Assessment & Plan Note (Signed)
TSH 4.61 in 01/2022. Further recommendations will be given according to lab results.

## 2023-01-09 NOTE — Patient Instructions (Addendum)
A few things to remember from today's visit:  Gastroesophageal reflux disease without esophagitis  Hypothyroidism, unspecified type - Plan: TSH, T3, free, T4, free  Abnormal TSH - Plan: TSH, T3, free, T4, free  Numbness and tingling of left leg  Varicose veins of both lower extremities, unspecified whether complicated - Plan: Ambulatory referral to Vascular Surgery  Coloration changes around your ankle do not seem concerning, could be related to vein disease. No changes today.  If you need refills for medications you take chronically, please call your pharmacy. Do not use My Chart to request refills or for acute issues that need immediate attention. If you send a my chart message, it may take a few days to be addressed, specially if I am not in the office.  Please be sure medication list is accurate. If a new problem present, please set up appointment sooner than planned today.

## 2023-01-11 ENCOUNTER — Ambulatory Visit (INDEPENDENT_AMBULATORY_CARE_PROVIDER_SITE_OTHER): Payer: 59

## 2023-01-11 DIAGNOSIS — J309 Allergic rhinitis, unspecified: Secondary | ICD-10-CM

## 2023-01-19 ENCOUNTER — Ambulatory Visit (INDEPENDENT_AMBULATORY_CARE_PROVIDER_SITE_OTHER): Payer: 59

## 2023-01-19 DIAGNOSIS — J309 Allergic rhinitis, unspecified: Secondary | ICD-10-CM | POA: Diagnosis not present

## 2023-01-22 ENCOUNTER — Ambulatory Visit (INDEPENDENT_AMBULATORY_CARE_PROVIDER_SITE_OTHER): Payer: 59

## 2023-01-22 DIAGNOSIS — M069 Rheumatoid arthritis, unspecified: Secondary | ICD-10-CM | POA: Diagnosis not present

## 2023-01-22 DIAGNOSIS — G894 Chronic pain syndrome: Secondary | ICD-10-CM | POA: Diagnosis not present

## 2023-01-22 DIAGNOSIS — M5416 Radiculopathy, lumbar region: Secondary | ICD-10-CM | POA: Diagnosis not present

## 2023-01-22 DIAGNOSIS — J309 Allergic rhinitis, unspecified: Secondary | ICD-10-CM | POA: Diagnosis not present

## 2023-01-22 DIAGNOSIS — M5412 Radiculopathy, cervical region: Secondary | ICD-10-CM | POA: Diagnosis not present

## 2023-01-22 DIAGNOSIS — Z5181 Encounter for therapeutic drug level monitoring: Secondary | ICD-10-CM | POA: Diagnosis not present

## 2023-01-22 DIAGNOSIS — Z79899 Other long term (current) drug therapy: Secondary | ICD-10-CM | POA: Diagnosis not present

## 2023-01-24 DIAGNOSIS — M0609 Rheumatoid arthritis without rheumatoid factor, multiple sites: Secondary | ICD-10-CM | POA: Diagnosis not present

## 2023-01-30 DIAGNOSIS — Z1331 Encounter for screening for depression: Secondary | ICD-10-CM | POA: Diagnosis not present

## 2023-01-30 DIAGNOSIS — Z6838 Body mass index (BMI) 38.0-38.9, adult: Secondary | ICD-10-CM | POA: Diagnosis not present

## 2023-01-30 DIAGNOSIS — E669 Obesity, unspecified: Secondary | ICD-10-CM | POA: Diagnosis not present

## 2023-01-30 DIAGNOSIS — M069 Rheumatoid arthritis, unspecified: Secondary | ICD-10-CM | POA: Diagnosis not present

## 2023-01-31 ENCOUNTER — Ambulatory Visit (INDEPENDENT_AMBULATORY_CARE_PROVIDER_SITE_OTHER): Payer: 59

## 2023-01-31 DIAGNOSIS — J309 Allergic rhinitis, unspecified: Secondary | ICD-10-CM

## 2023-02-07 DIAGNOSIS — N939 Abnormal uterine and vaginal bleeding, unspecified: Secondary | ICD-10-CM | POA: Diagnosis not present

## 2023-02-07 DIAGNOSIS — B078 Other viral warts: Secondary | ICD-10-CM | POA: Diagnosis not present

## 2023-02-07 DIAGNOSIS — N898 Other specified noninflammatory disorders of vagina: Secondary | ICD-10-CM | POA: Diagnosis not present

## 2023-02-07 DIAGNOSIS — L821 Other seborrheic keratosis: Secondary | ICD-10-CM | POA: Diagnosis not present

## 2023-02-07 DIAGNOSIS — K130 Diseases of lips: Secondary | ICD-10-CM | POA: Diagnosis not present

## 2023-02-13 ENCOUNTER — Other Ambulatory Visit: Payer: Self-pay | Admitting: *Deleted

## 2023-02-13 DIAGNOSIS — M7989 Other specified soft tissue disorders: Secondary | ICD-10-CM

## 2023-02-26 ENCOUNTER — Ambulatory Visit (INDEPENDENT_AMBULATORY_CARE_PROVIDER_SITE_OTHER): Payer: 59

## 2023-02-26 ENCOUNTER — Encounter: Payer: Self-pay | Admitting: Family Medicine

## 2023-02-26 DIAGNOSIS — J309 Allergic rhinitis, unspecified: Secondary | ICD-10-CM

## 2023-03-05 ENCOUNTER — Ambulatory Visit (HOSPITAL_COMMUNITY)
Admission: RE | Admit: 2023-03-05 | Discharge: 2023-03-05 | Disposition: A | Discharge status: Home / Self Care | Payer: 59 | Source: Ambulatory Visit | Attending: Vascular Surgery | Admitting: Vascular Surgery

## 2023-03-05 DIAGNOSIS — M7989 Other specified soft tissue disorders: Secondary | ICD-10-CM | POA: Insufficient documentation

## 2023-03-06 MED ORDER — ONDANSETRON 4 MG PO TBDP: 4.0000 mg | ORAL_TABLET | Freq: Three times a day (TID) | ORAL | 0 refills | Status: DC | PRN

## 2023-03-06 NOTE — Telephone Encounter (Signed)
Pt call back and stated she have been vomiting

## 2023-03-06 NOTE — Progress Notes (Unsigned)
Requested by:  Swaziland, Betty G, MD [address] [place],  [place] [address]  Reason for consultation: lower extremity edema    History of Present Illness   Anne Jefferson is a 47 y.o. (Aug 23, 1975) female who presents for evaluation of bilateral lower extremity swelling, pain, restlessness, aching, and pressure that has been going on for several years but has worsened over past 6 months -1 year. She does report a significant increase in her weight over this time as she has been in too much discomfort to exercise at all. She says she pretty much goes to work and comes home and goes straight to bed. She explains that her legs bother her constantly. Everything is aggravating factors, no alleviating factors. She says that it is uncomfortable to both sit or stand for very long. She drives a lot for her job and this is very uncomfortable for her.She reports that she feels like she has to get up and move her legs. She additionally has swelling in both legs. She is able to always see her sock imprints around her ankles. Her swelling at times is worse. Not really relieved upon first waking. She does not regularly elevate and has not worn compression stockings. She does have significant history of chronic back pain as well as well as RA. She has no family history or personal history of DVT.  She does have family history of venous disease in her mother  Venous symptoms include: aching, heavy, tired,swelling Onset/duration:  < 1 year  Occupation:  SLP Aggravating factors: sitting, standing Alleviating factors: noon Compression:  has not tired before Helps:  no Pain medications:  Tramadol/Gabapentin Previous vein procedures:  none History of DVT:  no  Past Medical History:  Diagnosis Date   Abnormal Pap smear    Anxiety    no meds   Asthma    mild, no issue for years no  inhaler use except in winter   Biliary colic    Blood type, Rh negative    Gallstones    H/O dysmenorrhea     Infertility, female    Nausea and vomiting    Pain    back and chest realted to gallbladder since november 2018   PONV (postoperative nausea and vomiting)    severe, needs heavy anti nausea meds   Spinal stenosis     Past Surgical History:  Procedure Laterality Date    c sections     x 2  3 births last was twins   BUNIONECTOMY Right    CHOLECYSTECTOMY N/A 11/22/2017   Procedure: LAPAROSCOPIC CHOLECYSTECTOMY ERAS PATHWAY;  Surgeon: Rodman Pickle, MD;  Location: [facility];  Service: General;  Laterality: N/A;   LEEP  12/23/2009   WISDOM TOOTH EXTRACTION      Social History   Socioeconomic History   Marital status: Divorced    Spouse name: Not on file   Number of children: 1   Years of education: Not on file   Highest education level: Master's degree (e.g., MA, MS, MEng, MEd, MSW, MBA)  Occupational History    Comment: Doctor, 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  Tobacco Use   Smoking status: Never   Smokeless tobacco: Never  Vaping Use   Vaping status: Never Used  Substance and Sexual Activity   Alcohol use: Yes    Comment: wine occ   Drug use: No   Sexual activity: Yes    Birth control/protection: None  Other Topics Concern   Not on file  Social  History Narrative   Not on file   Social Determinants of Health   Financial Resource Strain: Low Risk  (01/29/2023)   Received from [facility], [facility]   Overall Financial Resource Strain (CARDIA)    Difficulty of Paying Living Expenses: Not very hard  Food Insecurity: No Food Insecurity (01/29/2023)   Received from [facility], [facility]   Hunger Vital Sign    Worried About Running Out of Food in the Last Year: Never true    Ran Out of Food in the Last Year: Never true  Transportation Needs: No Transportation Needs (01/29/2023)   Received from [facility], [facility]   PRAPARE - Transportation    Lack of Transportation (Medical): No    Lack of Transportation (Non-Medical): No  Physical Activity:  Insufficiently Active (01/29/2023)   Received from [facility], [facility]   Exercise Vital Sign    Days of Exercise per Week: 1 day    Minutes of Exercise per Session: 10 min  Stress: No Stress Concern Present (01/29/2023)   Received from [facility], [facility] - Occupational Stress Questionnaire    Feeling of Stress : Not at all  Social Connections: Socially Integrated (01/29/2023)   Received from [facility], [facility]   Social Network    How would you rate your social network (family, work, friends)?: Good participation with social networks  Intimate Partner Violence: Not At Risk (01/29/2023)   Received from [facility], [facility]   HITS    Over the last 12 months how often did your partner physically hurt you?: 1    Over the last 12 months how often did your partner insult you or talk down to you?: 1    Over the last 12 months how often did your partner threaten you with physical harm?: 1    Over the last 12 months how often did your partner scream or curse at you?: 1    Family History  Problem Relation Age of Onset   Allergic rhinitis Mother    Osteoarthritis Mother    Healthy Brother    Healthy Brother    Diabetes Maternal Grandmother    Skin cancer Maternal Grandmother    Celiac disease Son    Autoimmune disease Son    Anemia Son    Diabetes Son        borderline    Current Outpatient Medications  Medication Sig Dispense Refill   albuterol (PROVENTIL HFA) 108 (90 Base) MCG/ACT inhaler Inhale 2 puffs into the lungs every 4 (four) hours as needed for wheezing or shortness of breath. 54 g 1   albuterol (PROVENTIL) (2.5 MG/3ML) 0.083% nebulizer solution Use 1 unit dose via the nebulizer every 6 hours as needed for cough, wheeze, tightness in chest, or shortness of breath. 75 mL 3   Albuterol-Budesonide (AIRSUPRA) 90-80 MCG/ACT AERO Inhale 2 puffs into the lungs every 4 (four) hours as needed (cough, wheeze,  shortness of breath). 32.1 g 1   AUVI-Q 0.3 MG/0.3ML SOAJ injection Inject 0.3 mg into the muscle as needed for anaphylaxis. 1 each 1   azelastine (ASTELIN) 0.1 % nasal spray 1-2 sprays each nostril twice daily for drainage control. 90 mL 1   BREZTRI AEROSPHERE 160-9-4.8 MCG/ACT AERO Inhale 2 puffs into the lungs in the morning and at bedtime. 32.1 g 1   budesonide (PULMICORT) 0.5 MG/2ML nebulizer solution TAKE 2 MLS (0.5 MG TOTAL) BY NEBULIZATION 3 (THREE) TIMES DAILY AS  NEEDED (ASTHMA FLARE). 540 mL 1   celecoxib (CELEBREX) 100 MG capsule Take 100 mg by mouth 2 (two) times daily.     DULoxetine (CYMBALTA) 30 MG capsule Take 90 mg by mouth daily.     DULoxetine (CYMBALTA) 60 MG capsule Take 1 capsule (60 mg total) by mouth daily. 90 capsule 0   EPINEPHrine (AUVI-Q) 0.3 mg/0.3 mL IJ SOAJ injection Inject 0.3 mg into the muscle as needed for anaphylaxis. 2 each 1   fexofenadine (ALLEGRA) 180 MG tablet Take 180 mg by mouth 2 (two) times daily.     folic acid (FOLVITE) 1 MG tablet Take 1 mg by mouth daily. Everyday but Thursdays     gabapentin (NEURONTIN) 300 MG capsule Take 300 mg by mouth 3 (three) times daily.     Golimumab (SIMPONI ARIA IV) Inject into the vein.     golimumab (SIMPONI ARIA) 50 MG/4ML SOLN injection Inject 50 mg into the vein every 8 (eight) weeks.     hydroxychloroquine (PLAQUENIL) 200 MG tablet Take 200 mg by mouth 2 (two) times daily.     methotrexate (RHEUMATREX) 2.5 MG tablet Take 10 mg by mouth once a week. Caution:Chemotherapy. Protect from light. Takes on Thursdays     mometasone (NASONEX) 50 MCG/ACT nasal spray 1 spray each nostril twice daily for nasal congestion control. 51 g 1   omeprazole (PRILOSEC) 40 MG capsule Take 1 capsule (40 mg total) by mouth 2 (two) times daily. 180 capsule 1   ondansetron (ZOFRAN-ODT) 4 MG disintegrating tablet Take 1 tablet (4 mg total) by mouth every 8 (eight) hours as needed for nausea or vomiting. 30 tablet 0   predniSONE (DELTASONE) 20  MG tablet Taper as prescribed by rheumatology     predniSONE (DELTASONE) 5 MG tablet Take 10 mg by mouth daily.     traMADol (ULTRAM) 50 MG tablet Take 100 mg by mouth 1 day or 1 dose.     VITAMIN D PO Take by mouth daily.     dicyclomine (BENTYL) 20 MG tablet Take 1 tablet (20 mg total) by mouth 2 (two) times daily as needed (abdominal spasms). (Patient not taking: Reported on 03/07/2023) 20 tablet 0   docusate sodium (COLACE) 100 MG capsule Take 1 capsule (100 mg total) by mouth every 12 (twelve) hours. (Patient not taking: Reported on 03/07/2023) 60 capsule 0   losartan (COZAAR) 25 MG tablet Take 1 tablet (25 mg total) by mouth daily. (Patient not taking: Reported on 03/07/2023) 90 tablet 2   UNABLE TO FIND 2 (two) times a week. Med Name: ALLERGY SHOTS     No current facility-administered medications for this visit.    Allergies  Allergen Reactions   Promethazine Other (See Comments)    Hallucinations   Theophyllines Nausea And Vomiting   Mite (D. Farinae) Other (See Comments)    Cats, dogs, dust    Molds & Smuts Other (See Comments)    REVIEW OF SYSTEMS (negative unless checked):   Cardiac:  []  Chest pain or chest pressure? []  Shortness of breath upon activity? []  Shortness of breath when lying flat? []  Irregular heart rhythm?  Vascular:  []  Pain in calf, thigh, or hip brought on by walking? []  Pain in feet at night that wakes you up from your sleep? []  Blood clot in your veins? []  Leg swelling?  Pulmonary:  []  Oxygen at home? []  Productive cough? []  Wheezing?  Neurologic:  []  Sudden weakness in arms or legs? []  Sudden numbness in arms or  legs? []  Sudden onset of difficult speaking or slurred speech? []  Temporary loss of vision in one eye? []  Problems with dizziness?  Gastrointestinal:  []  Blood in stool? []  Vomited blood?  Genitourinary:  []  Burning when urinating? []  Blood in urine?  Psychiatric:  []  Major depression  Hematologic:  []  Bleeding  problems? []  Problems with blood clotting?  Dermatologic:  []  Rashes or ulcers?  Constitutional:  []  Fever or chills?  Ear/Nose/Throat:  []  Change in hearing? []  Nose bleeds? []  Sore throat?  Musculoskeletal:  []  Back pain? []  Joint pain? []  Muscle pain?   Physical Examination     Vitals:   03/07/23 1506  BP: (!) 170/106  Pulse: 93  Resp: 18  Temp: 98.1 F (36.7 C)  TempSrc: Temporal  SpO2: 93%  Weight: 213 lb 1.6 oz (96.7 kg)  Height: 5\' 3"  (1.6 m)   Body mass index is 37.75 kg/m.  General:  WDWN in NAD; vital signs documented above Gait: normal  HENT: WNL, normocephalic Pulmonary: normal non-labored breathing Cardiac: regular HR Abdomen: soft Vascular Exam/Pulses:2+ femoral, 2+ DP and PT pulses bilaterally, feet warm and well perfused Extremities: without varicose veins, without reticular veins, with bilateral non pitting edema, without stasis pigmentation, without lipodermatosclerosis, without ulcers Musculoskeletal: no muscle wasting or atrophy  Neurologic: A&O X 3;  No focal weakness or paresthesias are detected Psychiatric:  The pt has Normal affect.  Non-invasive Vascular Imaging   BLE Venous Insufficiency Duplex (03/06/23):  RLE:  No DVT and SVT GSV reflux SFJ and proximal thigh GSV diameter 0.48-0.76 No SSV reflux CFV deep venous reflux   Medical Decision Making   Eilleen Mcelhenney is a 47 y.o. female who presents with: RLE chronic venous insufficiency with swelling,  pain, restlessness, aching, and pressure that has been going on for several years but has worsened over past 6 months -1 year.  Some of her symptoms are not typical for venous insufficiency and seem much more severe then what one would expect from her duplex findings. I think likely her symptoms are multifactorial. I had this discussion with her today and have discussed the nature and progression of venous disease. I still think she would greatly benefit from conservative  therapy for her venous disease.  Her duplex shows no DVT or SVT. She does have a little deep and superficial venous insufficiency. No SSV reflux. Her vein is adequate size to be considered for venous ablation however I discussed with her that since her symptoms seem beyond what would be expected for venous insufficiency at this time I would not recommend any further intervention at this time.  Based on the patient's history and examination, I recommend: daily elevation of 20-30 minutes above level of heart, daily compression stocking use, exercise, weight reduction, refraining from prolonged sitting or standing. I discussed with the patient the use of her 15-20 mm knee high compression stockings I recommend she follow up with PCP for further evaluation. She may benefit from neurology or further evaluation for some type of neuromuscular disorder The patient will follow up as needed if she has any new or worsening symptoms   Graceann Congress, PA-C [facility] Office: [phone]  03/07/2023, 3:18 PM  Clinic MD: Randie Heinz

## 2023-03-07 ENCOUNTER — Encounter (HOSPITAL_COMMUNITY): Payer: 59

## 2023-03-07 ENCOUNTER — Ambulatory Visit (INDEPENDENT_AMBULATORY_CARE_PROVIDER_SITE_OTHER): Payer: 59 | Admitting: Physician Assistant

## 2023-03-07 VITALS — BP 170/106 | HR 93 | Temp 98.1°F | Resp 18 | Ht 63.0 in | Wt 213.1 lb

## 2023-03-07 DIAGNOSIS — I872 Venous insufficiency (chronic) (peripheral): Secondary | ICD-10-CM | POA: Diagnosis not present

## 2023-03-07 DIAGNOSIS — M79605 Pain in left leg: Secondary | ICD-10-CM

## 2023-03-07 DIAGNOSIS — M79604 Pain in right leg: Secondary | ICD-10-CM | POA: Diagnosis not present

## 2023-03-07 DIAGNOSIS — M7989 Other specified soft tissue disorders: Secondary | ICD-10-CM

## 2023-03-08 ENCOUNTER — Encounter: Payer: Self-pay | Admitting: Physician Assistant

## 2023-03-12 ENCOUNTER — Encounter: Payer: Self-pay | Admitting: Family Medicine

## 2023-03-12 ENCOUNTER — Ambulatory Visit (INDEPENDENT_AMBULATORY_CARE_PROVIDER_SITE_OTHER): Payer: 59 | Admitting: Family Medicine

## 2023-03-12 VITALS — BP 138/82 | HR 89 | Temp 98.3°F | Wt 212.0 lb

## 2023-03-12 DIAGNOSIS — I10 Essential (primary) hypertension: Secondary | ICD-10-CM

## 2023-03-12 DIAGNOSIS — I872 Venous insufficiency (chronic) (peripheral): Secondary | ICD-10-CM | POA: Insufficient documentation

## 2023-03-12 DIAGNOSIS — E669 Obesity, unspecified: Secondary | ICD-10-CM

## 2023-03-12 DIAGNOSIS — M069 Rheumatoid arthritis, unspecified: Secondary | ICD-10-CM | POA: Diagnosis not present

## 2023-03-12 HISTORY — DX: Venous insufficiency (chronic) (peripheral): I87.2

## 2023-03-12 MED ORDER — FUROSEMIDE 20 MG PO TABS: 20.0000 mg | ORAL_TABLET | Freq: Every day | ORAL | 1 refills | Status: DC

## 2023-03-12 NOTE — Progress Notes (Signed)
   Subjective:    Patient ID: Anne Jefferson, female    DOB: May 02, 1976, 47 y.o.   MRN: [id number]  HPI Here asking for help with swelling in the legs, which started about 3 years ago. She describes feeling numbness, tingling, and pain in the legs that comes and goes. No SOB. She has RA, and she sees Dr. Chales Abrahams in [place] for this. She is on Simponi, Plaquenil, Methotrexate and Prednisone for this. Her renal function is normal (in March creatine was 0.8 and GFR was 87). Her ECHO on 01-24-22 showed an EF of 60-65% with normal diastolic function. She saw Vascular Surgery on 03-07-23, and they told her to see her PCP for the leg swelling. She already wears compression stockings every day. No SOB. She has never taken a diuretic.    Review of Systems  Respiratory: Negative.    Cardiovascular:  Positive for leg swelling. Negative for chest pain and palpitations.  Musculoskeletal:  Positive for arthralgias.       Objective:   Physical Exam Constitutional:      Appearance: She is obese.  Cardiovascular:     Rate and Rhythm: Normal rate and regular rhythm.     Pulses: Normal pulses.     Heart sounds: Normal heart sounds.  Pulmonary:     Effort: Pulmonary effort is normal.     Breath sounds: Normal breath sounds.  Musculoskeletal:     Comments: 3+ edema in both lower legs   Neurological:     Mental Status: She is alert.           Assessment & Plan:  Her leg swelling is likely due to a combination of things including venous insufficiency, obesity, and side effects of medications such as Prednisone. We will let her try Lasix 20 mg daily for a month or so. She will try B complex vitamins as well. Follow up with Dr. Swaziland in a month. We spent a total of (32   ) minutes reviewing records and discussing these issues.  Gershon Crane, MD

## 2023-03-19 DIAGNOSIS — N83202 Unspecified ovarian cyst, left side: Secondary | ICD-10-CM | POA: Diagnosis not present

## 2023-03-21 DIAGNOSIS — M0609 Rheumatoid arthritis without rheumatoid factor, multiple sites: Secondary | ICD-10-CM | POA: Diagnosis not present

## 2023-03-30 ENCOUNTER — Ambulatory Visit (INDEPENDENT_AMBULATORY_CARE_PROVIDER_SITE_OTHER): Payer: 59

## 2023-03-30 DIAGNOSIS — J309 Allergic rhinitis, unspecified: Secondary | ICD-10-CM | POA: Diagnosis not present

## 2023-04-02 ENCOUNTER — Telehealth: Payer: Self-pay | Admitting: Family Medicine

## 2023-04-02 DIAGNOSIS — K219 Gastro-esophageal reflux disease without esophagitis: Secondary | ICD-10-CM

## 2023-04-02 NOTE — Telephone Encounter (Signed)
Pt is calling and almost out of omeprazole (PRILOSEC) 40 MG capsule  and pt said dr Swaziland told her she can not be on medication for long term and pt would like to know what is alternative  [id number] - [place], [place] - [address] Phone: [phone]  Fax: [phone]

## 2023-04-04 DIAGNOSIS — K219 Gastro-esophageal reflux disease without esophagitis: Secondary | ICD-10-CM | POA: Diagnosis not present

## 2023-04-04 DIAGNOSIS — N83209 Unspecified ovarian cyst, unspecified side: Secondary | ICD-10-CM | POA: Diagnosis not present

## 2023-04-04 DIAGNOSIS — R32 Unspecified urinary incontinence: Secondary | ICD-10-CM | POA: Diagnosis not present

## 2023-04-06 ENCOUNTER — Other Ambulatory Visit: Payer: Self-pay | Admitting: Family Medicine

## 2023-04-06 DIAGNOSIS — F331 Major depressive disorder, recurrent, moderate: Secondary | ICD-10-CM

## 2023-04-08 ENCOUNTER — Other Ambulatory Visit: Payer: Self-pay | Admitting: Family Medicine

## 2023-04-09 ENCOUNTER — Telehealth: Payer: Self-pay | Admitting: Family Medicine

## 2023-04-09 NOTE — Telephone Encounter (Signed)
Pt is checking on the below message

## 2023-04-09 NOTE — Telephone Encounter (Signed)
I recommend wearing compression stockings and lower extremity elevation. Follow-up if problem is persistent or gets worse. Thanks, BJ

## 2023-04-09 NOTE — Telephone Encounter (Signed)
Pt is calling and saw vascular clinic and was dx with venous insufficiency and venous reflux and was started on lasix 20 mg and also compression stocking. Pt is calling and I offer her an appt and she decline. Pt said she is having bilateral edema +3 and would like a callback

## 2023-04-09 NOTE — Telephone Encounter (Signed)
If she feels like omeprazole 40 mg daily, 30-minute before breakfast helps with symptoms, she can continue.  Otherwise GI evaluation needs to be considered. Continue GERD precautions. Thanks, BJ

## 2023-04-09 NOTE — Telephone Encounter (Signed)
She saw Dr. Clent Ridges 8/19

## 2023-04-09 NOTE — Telephone Encounter (Signed)
Pt  was advised by Dr Clent Ridges to f/u with Dr Swaziland in a month.Please advise

## 2023-04-10 DIAGNOSIS — R600 Localized edema: Secondary | ICD-10-CM | POA: Diagnosis not present

## 2023-04-10 DIAGNOSIS — N83201 Unspecified ovarian cyst, right side: Secondary | ICD-10-CM | POA: Diagnosis not present

## 2023-04-10 DIAGNOSIS — M0609 Rheumatoid arthritis without rheumatoid factor, multiple sites: Secondary | ICD-10-CM | POA: Diagnosis not present

## 2023-04-10 DIAGNOSIS — N83202 Unspecified ovarian cyst, left side: Secondary | ICD-10-CM | POA: Diagnosis not present

## 2023-04-10 DIAGNOSIS — Z79899 Other long term (current) drug therapy: Secondary | ICD-10-CM | POA: Diagnosis not present

## 2023-04-10 MED ORDER — OMEPRAZOLE 40 MG PO CPDR: 40.0000 mg | DELAYED_RELEASE_CAPSULE | Freq: Two times a day (BID) | ORAL | 3 refills | Status: DC

## 2023-04-10 NOTE — Telephone Encounter (Signed)
I spoke with patient, medication is still helping. Rx sent in.

## 2023-04-10 NOTE — Telephone Encounter (Signed)
I called and spoke with patient. She is aware of message below. I asked if she wanted to move her appt sooner, but she stated that she will keep her appt for 9/24 at 7:30.

## 2023-04-16 NOTE — Progress Notes (Incomplete)
HPI: [name] is a 47 y.o. female with a PMHx significant for HTN, chronic pain, IBS-C/D, GERD, asthma, anxiety, and venous insufficiency who is here today for chronic disease management ***.  Last seen on 01/09/2023  *** Review of Systems See other pertinent positives and negatives in HPI.  Current Outpatient Medications on File Prior to Visit  Medication Sig Dispense Refill   acetaminophen (TYLENOL) 500 MG tablet Take by mouth.     albuterol (PROVENTIL HFA) 108 (90 Base) MCG/ACT inhaler Inhale 2 puffs into the lungs every 4 (four) hours as needed for wheezing or shortness of breath. 54 g 1   albuterol (PROVENTIL) (2.5 MG/3ML) 0.083% nebulizer solution Use 1 unit dose via the nebulizer every 6 hours as needed for cough, wheeze, tightness in chest, or shortness of breath. 75 mL 3   Albuterol-Budesonide (AIRSUPRA) 90-80 MCG/ACT AERO Inhale 2 puffs into the lungs every 4 (four) hours as needed (cough, wheeze, shortness of breath). 32.1 g 1   AUVI-Q 0.3 MG/0.3ML SOAJ injection Inject 0.3 mg into the muscle as needed for anaphylaxis. 1 each 1   BREZTRI AEROSPHERE 160-9-4.8 MCG/ACT AERO Inhale 2 puffs into the lungs in the morning and at bedtime. 32.1 g 1   budesonide (PULMICORT) 0.5 MG/2ML nebulizer solution TAKE 2 MLS (0.5 MG TOTAL) BY NEBULIZATION 3 (THREE) TIMES DAILY AS NEEDED (ASTHMA FLARE). 540 mL 1   celecoxib (CELEBREX) 100 MG capsule Take 100 mg by mouth 2 (two) times daily.     docusate sodium (COLACE) 100 MG capsule Take 1 capsule (100 mg total) by mouth every 12 (twelve) hours. 60 capsule 0   DULoxetine (CYMBALTA) 30 MG capsule Take 90 mg by mouth daily.     DULoxetine (CYMBALTA) 60 MG capsule Take 1 capsule (60 mg total) by mouth daily. 90 capsule 0   EPINEPHrine (AUVI-Q) 0.3 mg/0.3 mL IJ SOAJ injection Inject 0.3 mg into the muscle as needed for anaphylaxis. 2 each 1   fexofenadine (ALLEGRA) 180 MG tablet Take 180 mg by mouth 2 (two) times daily.     folic  acid (FOLVITE) 1 MG tablet Take 1 mg by mouth daily. Everyday but Thursdays     furosemide (LASIX) 20 MG tablet Take 1 tablet (20 mg total) by mouth daily. 30 tablet 1   gabapentin (NEURONTIN) 600 MG tablet Take 600 mg by mouth 3 (three) times daily.     Golimumab (SIMPONI ARIA IV) Inject into the vein.     golimumab (SIMPONI ARIA) 50 MG/4ML SOLN injection Inject 50 mg into the vein every 8 (eight) weeks.     hydroxychloroquine (PLAQUENIL) 200 MG tablet Take 200 mg by mouth 2 (two) times daily.     losartan (COZAAR) 25 MG tablet Take 1 tablet (25 mg total) by mouth daily. 90 tablet 2   methotrexate (RHEUMATREX) 2.5 MG tablet Take 10 mg by mouth once a week. Caution:Chemotherapy. Protect from light. Takes on Thursdays     mometasone (NASONEX) 50 MCG/ACT nasal spray 1 spray each nostril twice daily for nasal congestion control. 51 g 1   omeprazole (PRILOSEC) 40 MG capsule Take 1 capsule (40 mg total) by mouth 2 (two) times daily. 180 capsule 3   ondansetron (ZOFRAN-ODT) 4 MG disintegrating tablet Take 1 tablet (4 mg total) by mouth every 8 (eight) hours as needed for nausea or vomiting. 30 tablet 0   predniSONE (DELTASONE) 20 MG tablet Taper as prescribed by rheumatology     predniSONE (DELTASONE) 5 MG tablet  Take 10 mg by mouth daily.     traMADol (ULTRAM) 50 MG tablet Take 100 mg by mouth 1 day or 1 dose.     UNABLE TO FIND 2 (two) times a week. Med Name: ALLERGY SHOTS     VITAMIN D PO Take by mouth daily.     No current facility-administered medications on file prior to visit.    Past Medical History:  Diagnosis Date   Abnormal Pap smear    Anxiety    no meds   Asthma    mild, no issue for years no  inhaler use except in winter   Biliary colic    Blood type, Rh negative    Gallstones    H/O dysmenorrhea    Infertility, female    Nausea and vomiting    Pain    back and chest realted to gallbladder since november 2018   PONV (postoperative nausea and vomiting)    severe, needs  heavy anti nausea meds   Spinal stenosis    Allergies  Allergen Reactions   Promethazine Other (See Comments)    Hallucinations   Theophyllines Nausea And Vomiting   Mite (D. 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  Tobacco Use   Smoking status: Never   Smokeless tobacco: Never  Vaping Use   Vaping status: Never Used  Substance and Sexual Activity   Alcohol use: Yes    Comment: wine occ   Drug use: No   Sexual activity: Yes    Birth control/protection: None  Other Topics Concern   Not on file  Social History Narrative   Not on file   Social Determinants of Health   Financial Resource Strain: Low Risk  (01/29/2023)   Received from [facility], [facility]   Overall Financial Resource Strain (CARDIA)    Difficulty of Paying Living Expenses: Not very hard  Food Insecurity: No Food Insecurity (01/29/2023)   Received from [facility], [facility]   Hunger Vital Sign    Worried About Running Out of Food in the Last Year: Never true    Ran Out of Food in the Last Year: Never true  Transportation Needs: No Transportation Needs (01/29/2023)   Received from [facility]   PRAPARE - Transportation    Lack of Transportation (Medical): No    Lack of Transportation (Non-Medical): No  Physical Activity: Insufficiently Active (01/29/2023)   Received from [facility], [facility]   Exercise Vital Sign    Days of Exercise per Week: 1 day    Minutes of Exercise per Session: 10 min  Stress: No Stress Concern Present (01/29/2023)   Received from [facility], [facility] -  Occupational Stress Questionnaire    Feeling of Stress : Not at all  Social Connections: Socially Integrated (01/29/2023)   Received from [facility], [facility]   Social Network    How would you rate your social network (family, work, friends)?: Good participation with social networks    There were no vitals filed for this  visit. There is no height or weight on file to calculate BMI.  Physical Exam  ASSESSMENT AND PLAN:  Ms. Syfrett was seen today for ***.   No orders of the defined types were placed in this encounter.   No problem-specific Assessment & Plan notes found for this encounter.   No follow-ups on file.  I, Suanne Marker, acting as a scribe for Betty Swaziland, MD., have documented all relevant documentation on the behalf of Betty Swaziland, MD, as directed by  Betty Swaziland, MD while in the presence of Betty Swaziland, MD.   I, Suanne Marker, have reviewed all documentation for this visit. The documentation on 04/16/23 for the exam, diagnosis, procedures, and orders are all accurate and complete.  Betty G. Swaziland, MD  [facility] [place] office.

## 2023-04-17 ENCOUNTER — Ambulatory Visit: Payer: 59 | Admitting: Family Medicine

## 2023-04-17 ENCOUNTER — Telehealth: Payer: Self-pay | Admitting: Family Medicine

## 2023-04-17 NOTE — Telephone Encounter (Signed)
Prescription Request  04/17/2023  LOV: 01/09/2023  What is the name of the medication or equipment? Lasix. Pt states she is completely out.   Have you contacted your pharmacy to request a refill? No   Which pharmacy would you like this sent to? [id number] - [place], [place] - [address] [place] [address] Phone: [phone] Fax: [phone]  Patient notified that their request is being sent to the clinical staff for review and that they should receive a response within 2 business days.   Please advise at Mobile [phone] (mobile)

## 2023-04-17 NOTE — Telephone Encounter (Signed)
I believe medication was prescribed by Dr Clent Ridges and she was supposed to arrange a follow-up appt to evaluate if medication needed to be continued. In person visit is necessary, missed appt today. Thanks, BJ

## 2023-04-19 DIAGNOSIS — N83202 Unspecified ovarian cyst, left side: Secondary | ICD-10-CM | POA: Diagnosis not present

## 2023-04-19 DIAGNOSIS — M0609 Rheumatoid arthritis without rheumatoid factor, multiple sites: Secondary | ICD-10-CM | POA: Diagnosis not present

## 2023-04-19 DIAGNOSIS — N83201 Unspecified ovarian cyst, right side: Secondary | ICD-10-CM | POA: Diagnosis not present

## 2023-04-19 DIAGNOSIS — R600 Localized edema: Secondary | ICD-10-CM | POA: Diagnosis not present

## 2023-04-19 DIAGNOSIS — Z79899 Other long term (current) drug therapy: Secondary | ICD-10-CM | POA: Diagnosis not present

## 2023-04-20 ENCOUNTER — Encounter: Payer: Self-pay | Admitting: Family Medicine

## 2023-04-20 ENCOUNTER — Ambulatory Visit (INDEPENDENT_AMBULATORY_CARE_PROVIDER_SITE_OTHER): Payer: 59 | Admitting: Family Medicine

## 2023-04-20 VITALS — BP 138/82 | HR 95 | Resp 12 | Ht 63.0 in

## 2023-04-20 DIAGNOSIS — F331 Major depressive disorder, recurrent, moderate: Secondary | ICD-10-CM | POA: Diagnosis not present

## 2023-04-20 DIAGNOSIS — R600 Localized edema: Secondary | ICD-10-CM

## 2023-04-20 DIAGNOSIS — I872 Venous insufficiency (chronic) (peripheral): Secondary | ICD-10-CM | POA: Diagnosis not present

## 2023-04-20 DIAGNOSIS — Z0189 Encounter for other specified special examinations: Secondary | ICD-10-CM | POA: Diagnosis not present

## 2023-04-20 DIAGNOSIS — R52 Pain, unspecified: Secondary | ICD-10-CM | POA: Diagnosis not present

## 2023-04-20 DIAGNOSIS — K219 Gastro-esophageal reflux disease without esophagitis: Secondary | ICD-10-CM

## 2023-04-20 DIAGNOSIS — I10 Essential (primary) hypertension: Secondary | ICD-10-CM | POA: Diagnosis not present

## 2023-04-20 DIAGNOSIS — R0602 Shortness of breath: Secondary | ICD-10-CM

## 2023-04-20 DIAGNOSIS — R110 Nausea: Secondary | ICD-10-CM

## 2023-04-20 DIAGNOSIS — G894 Chronic pain syndrome: Secondary | ICD-10-CM | POA: Diagnosis not present

## 2023-04-20 DIAGNOSIS — G8929 Other chronic pain: Secondary | ICD-10-CM

## 2023-04-20 HISTORY — DX: Nausea: R11.0

## 2023-04-20 HISTORY — DX: Shortness of breath: R06.02

## 2023-04-20 HISTORY — DX: Localized edema: R60.0

## 2023-04-20 LAB — COMPREHENSIVE METABOLIC PANEL
ALT: 19 U/L (ref 0–35)
AST: 18 U/L (ref 0–37)
Albumin: 4.1 g/dL (ref 3.5–5.2)
Alkaline Phosphatase: 52 U/L (ref 39–117)
BUN: 21 mg/dL (ref 6–23)
CO2: 30 meq/L (ref 19–32)
Calcium: 9.1 mg/dL (ref 8.4–10.5)
Chloride: 100 meq/L (ref 96–112)
Creatinine, Ser: 0.84 mg/dL (ref 0.40–1.20)
GFR: 81.49 mL/min (ref >60.00)
Glucose, Bld: 128 mg/dL — ABNORMAL HIGH (ref 70–99)
Potassium: 3.9 meq/L (ref 3.5–5.1)
Sodium: 138 meq/L (ref 135–145)
Total Bilirubin: 0.4 mg/dL (ref 0.2–1.2)
Total Protein: 6.7 g/dL (ref 6.0–8.3)

## 2023-04-20 LAB — CBC
HCT: 38.2 % (ref 36.0–46.0)
Hemoglobin: 12.3 g/dL (ref 12.0–15.0)
MCHC: 32.1 g/dL (ref 30.0–36.0)
MCV: 88.8 fL (ref 78.0–100.0)
Platelets: 249 10*3/uL (ref 150.0–400.0)
RBC: 4.31 Mil/uL (ref 3.87–5.11)
RDW: 15.7 % — ABNORMAL HIGH (ref 11.5–15.5)
WBC: 6.9 10*3/uL (ref 4.0–10.5)

## 2023-04-20 LAB — MICROALBUMIN / CREATININE URINE RATIO
Creatinine,U: 173.6 mg/dL
Microalb Creat Ratio: 1.2 mg/g (ref 0.0–30.0)
Microalb, Ur: 2.1 mg/dL — ABNORMAL HIGH (ref 0.0–1.9)

## 2023-04-20 LAB — TSH: TSH: 2.28 u[IU]/mL (ref 0.35–5.50)

## 2023-04-20 MED ORDER — CHLORTHALIDONE 25 MG PO TABS
25.0000 mg | ORAL_TABLET | Freq: Every day | ORAL | 0 refills | Status: DC
Start: 2023-04-20 — End: 2023-07-16

## 2023-04-20 MED ORDER — PANTOPRAZOLE SODIUM 40 MG PO TBEC
40.0000 mg | DELAYED_RELEASE_TABLET | Freq: Every day | ORAL | 1 refills | Status: DC
Start: 2023-04-20 — End: 2023-05-18

## 2023-04-20 NOTE — Assessment & Plan Note (Signed)
Problem is getting worse. She follows with rheumatologist and pain management. On Tramadol,Gabapentin,and Duloxetine. Considering applying for disability, recommend to discuss this with her pain management and rheumatologist. We can arrange appt with OT for disability evaluation.

## 2023-04-20 NOTE — Patient Instructions (Addendum)
A few things to remember from today's visit:  Essential (primary) hypertension - Plan: CBC, chlorthalidone (HYGROTON) 25 MG tablet, TSH, Comprehensive metabolic panel  Gastroesophageal reflux disease without esophagitis - Plan: pantoprazole (PROTONIX) 40 MG tablet  Nausea without vomiting - Plan: TSH  Bilateral lower extremity edema - Plan: Microalbumin / creatinine urine ratio, CBC, chlorthalidone (HYGROTON) 25 MG tablet  Patient requested diagnostic testing - Plan: TSH  Short of breath on exertion  At some point if you consider disability, you can have rheumatologist and pain management completing some of the forms, we can also arrange disability evaluation. Today Omeprazole and furosemide discontinued. Zofran to continue as needed and Pantoprazole 40 mg 30 min before breakfast, let me know in 3-4 weeks of it is better than omeprazole. Monitor blood pressure at home. If shortness of breath gets worse, we may need cardiac work up.  If you need refills for medications you take chronically, please call your pharmacy. Do not use My Chart to request refills or for acute issues that need immediate attention. If you send a my chart message, it may take a few days to be addressed, specially if I am not in the office.  Please be sure medication list is accurate. If a new problem present, please set up appointment sooner than planned today.

## 2023-04-20 NOTE — Assessment & Plan Note (Signed)
This is a chronic problem, she reports improvement, no longer having episodes of vomiting. She takes Zofran about once every 1 to 3 months, no changes today.

## 2023-04-20 NOTE — Assessment & Plan Note (Signed)
An episode recently while waking a hill. We discussed possible causes?  Deconditioning. Echo in 01/2022 LVEF 60 to 65%, normal diastolic function, no regional wall motion abnormalities. Lung auscultation normal. If persistent or if problem gets worse, cardiology consultation may be necessary. Instructed about warning signs.

## 2023-04-20 NOTE — Assessment & Plan Note (Signed)
She has already established with vascular. We discussed diagnosis, prognosis, and treatment options. Recommend wearing compression stockings regularly.

## 2023-04-20 NOTE — Assessment & Plan Note (Signed)
Problem is overall stable, aggravated by health problems. Continue Duloxetine 90 mg daily. Continue CBT.

## 2023-04-20 NOTE — Assessment & Plan Note (Addendum)
Problem has greatly improved with omeprazole 40 mg but still having mid upper chest discomfort once per week at night. She agrees with changing omeprazole for pantoprazole 40 mg 30 minutes before breakfast. Continue GERD precautions. She will let me know in about 3 to 4 weeks if new PPI helps.

## 2023-04-20 NOTE — Assessment & Plan Note (Signed)
We discussed possible etiologies. History and examination do not suggest a serious process, no edema noted today. Most likely related with venous insufficiency. Furosemide 20 mg did not help, do not recommend increasing the dose due to risk of side effects. Chlorthalidone 25 mg added today for better BP control. Comp patient stockings and LE elevation recommended.

## 2023-04-20 NOTE — Progress Notes (Signed)
HPI: Anne Jefferson is a 47 y.o. female with PMHx significant for HTN, chronic pain disorder, IBS-C/D, GERD, asthma, and anxiety, who is here today for chronic disease management.   Last seen on 01/09/2023.  She has several complaints today. Since her last visit she has been seen for LE edema, 03/12/23, recommended Furosemide 20 mg daily. Sent message reporting that problem was not better. State that she has been evaluated by vascular, Dx'ed with venous insufficiency.  She endorses +3 pitting bilateral LE edema that started a few months ago but has significantly worsened.   Driving and standing still for long periods have become more difficult for her and worsen her condition. States she has a very hard time getting out of the car. She reports being able to sense when her "legs fill up" and swell. She is also starting to notice swelling bilaterally in arms.  Notes that the condition of swelling varies, but sometimes is worse in the evenings.  No new medications.   Chronic pain:She reports her pain has continued to significantly worsen since her dx 3 years ago. Celebrex 100 mg 2x, tramadol 50 mg 3x, gabapentin 600 mg 3x daily. She is established with rheumatologist and pain management. Pt believes with the progress of her condition she may have to apply for disability, "sooner than later". She plans to start going to the gym more frequently once she is on disability, to help with weight loss.  BP was elevated at 140s/90s at rheumatology clinic yesterday. Still taking prednisone 5 mg daily, refuses to take a higher dose because she is concerned about elevated HgA1C, prediabetes. 01/2023 HgA1C was 6.4.  HTN on Losartan 25 mg daily. Attributes elevated BP to severe pain.  -Nausea for a while. She reports her nausea and vomiting have improved since her last visit. Has occasional nausea, but denies any current vomiting. Is only taking zofran about once every 3 months. She attributes her  prior episodes to having vestibular issues.   GERD:She is taking omeprazole 40 mg daily. She reports only having sx once a week, usually manifest with retrosternal upper CP.   -She reports vaginal bleeding.Followed up with gyn, Dx'ed with large cysts on left ovary..  At her next follow up, Pt reports imaging revealed cysts are now on both ovaries.  Has had recent lab work done, results are pending.  She reports a mild rash all throughout her body, which has continued to spread. Has been seen by 2 different dermatologists. Per pt, first dermatologist believed this was attributed to aging changes.  Also endorses SOB on exertion while walking up a hill recently. Negative for orthopnea or PND. No associated CP ,diaphoresis,or palpitations. Echo 02/03/22 LVEF 60-65%.  Depression: Needs refills on Duloxetine 60 mg and 30 mg. She is established with a therapist, and follows up once weekly.     04/20/2023    7:38 AM 03/12/2023   11:29 AM 10/03/2022    8:52 AM  Depression screen PHQ 2/9  Decreased Interest 1 1 1   Down, Depressed, Hopeless 3 3 3   PHQ - 2 Score 4 4 4   Altered sleeping 3 1 2   Tired, decreased energy 3 3 2   Change in appetite 3 0 2  Feeling bad or failure about yourself  0 1 1  Trouble concentrating 3 3 0  Moving slowly or fidgety/restless 1 0 0  Suicidal thoughts 0 1 0  PHQ-9 Score 17 13 11   Difficult doing work/chores Somewhat difficult Somewhat difficult Somewhat difficult  04/20/2023    7:38 AM 03/12/2023   11:30 AM 10/03/2022    8:52 AM  GAD 7 : Generalized Anxiety Score  Nervous, Anxious, on Edge 1 1 0  Control/stop worrying 0 1 1  Worry too much - different things 1 0 0  Trouble relaxing 1 0 0  Restless 0 0 0  Easily annoyed or irritable 0 2 0  Afraid - awful might happen 2 1 1   Total GAD 7 Score 5 5 2   Anxiety Difficulty Somewhat difficult Somewhat difficult Not difficult at all   She would like TSH to be checked today, last one in 01/2023 was  0.8.  Review of Systems  Constitutional:  Positive for activity change and fatigue. Negative for chills and fever.  HENT:  Negative for sore throat and trouble swallowing.   Cardiovascular:  Positive for leg swelling.  Gastrointestinal:  Positive for nausea. Negative for abdominal pain.  Endocrine: Negative for cold intolerance and heat intolerance.  Genitourinary:  Positive for vaginal bleeding. Negative for decreased urine volume, dysuria and hematuria.  Musculoskeletal:  Positive for arthralgias, joint swelling and myalgias.  Skin:  Negative for pallor and wound.  Neurological:  Negative for syncope and facial asymmetry.  Psychiatric/Behavioral:  Negative for confusion and hallucinations. The patient is nervous/anxious.   See other pertinent positives and negatives in HPI.  Current Outpatient Medications on File Prior to Visit  Medication Sig Dispense Refill   acetaminophen (TYLENOL) 500 MG tablet Take by mouth.     albuterol (PROVENTIL HFA) 108 (90 Base) MCG/ACT inhaler Inhale 2 puffs into the lungs every 4 (four) hours as needed for wheezing or shortness of breath. 54 g 1   albuterol (PROVENTIL) (2.5 MG/3ML) 0.083% nebulizer solution Use 1 unit dose via the nebulizer every 6 hours as needed for cough, wheeze, tightness in chest, or shortness of breath. 75 mL 3   Albuterol-Budesonide (AIRSUPRA) 90-80 MCG/ACT AERO Inhale 2 puffs into the lungs every 4 (four) hours as needed (cough, wheeze, shortness of breath). 32.1 g 1   AUVI-Q 0.3 MG/0.3ML SOAJ injection Inject 0.3 mg into the muscle as needed for anaphylaxis. 1 each 1   BREZTRI AEROSPHERE 160-9-4.8 MCG/ACT AERO Inhale 2 puffs into the lungs in the morning and at bedtime. 32.1 g 1   budesonide (PULMICORT) 0.5 MG/2ML nebulizer solution TAKE 2 MLS (0.5 MG TOTAL) BY NEBULIZATION 3 (THREE) TIMES DAILY AS NEEDED (ASTHMA FLARE). 540 mL 1   celecoxib (CELEBREX) 100 MG capsule Take 100 mg by mouth 2 (two) times daily.     docusate sodium  (COLACE) 100 MG capsule Take 1 capsule (100 mg total) by mouth every 12 (twelve) hours. 60 capsule 0   DULoxetine (CYMBALTA) 30 MG capsule Take 90 mg by mouth daily.     DULoxetine (CYMBALTA) 60 MG capsule Take 1 capsule (60 mg total) by mouth daily. 90 capsule 0   EPINEPHrine (AUVI-Q) 0.3 mg/0.3 mL IJ SOAJ injection Inject 0.3 mg into the muscle as needed for anaphylaxis. 2 each 1   fexofenadine (ALLEGRA) 180 MG tablet Take 180 mg by mouth 2 (two) times daily.     folic acid (FOLVITE) 1 MG tablet Take 1 mg by mouth daily. Everyday but Thursdays     gabapentin (NEURONTIN) 600 MG tablet Take 600 mg by mouth 3 (three) times daily.     Golimumab (SIMPONI ARIA IV) Inject into the vein.     golimumab (SIMPONI ARIA) 50 MG/4ML SOLN injection Inject 50 mg into the vein every  8 (eight) weeks.     hydroxychloroquine (PLAQUENIL) 200 MG tablet Take 200 mg by mouth 2 (two) times daily.     losartan (COZAAR) 25 MG tablet Take 1 tablet (25 mg total) by mouth daily. 90 tablet 2   methotrexate (RHEUMATREX) 2.5 MG tablet Take 10 mg by mouth once a week. Caution:Chemotherapy. Protect from light. Takes on Thursdays     mometasone (NASONEX) 50 MCG/ACT nasal spray 1 spray each nostril twice daily for nasal congestion control. 51 g 1   ondansetron (ZOFRAN-ODT) 4 MG disintegrating tablet Take 1 tablet (4 mg total) by mouth every 8 (eight) hours as needed for nausea or vomiting. 30 tablet 0   predniSONE (DELTASONE) 5 MG tablet Take 10 mg by mouth daily.     traMADol (ULTRAM) 50 MG tablet Take 100 mg by mouth 1 day or 1 dose.     UNABLE TO FIND 2 (two) times a week. Med Name: ALLERGY SHOTS     VITAMIN D PO Take by mouth daily.     No current facility-administered medications on file prior to visit.    Past Medical History:  Diagnosis Date   Abnormal Pap smear    Anxiety    no meds   Asthma    mild, no issue for years no  inhaler use except in winter   Biliary colic    Blood type, Rh negative    Gallstones     H/O dysmenorrhea    Infertility, female    Nausea and vomiting    Pain    back and chest realted to gallbladder since november 2018   PONV (postoperative nausea and vomiting)    severe, needs heavy anti nausea meds   Spinal stenosis    Allergies  Allergen Reactions   Promethazine Other (See Comments)    Hallucinations   Theophyllines Nausea And Vomiting   Mite (D. 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  Tobacco Use   Smoking status: Never   Smokeless tobacco: Never  Vaping Use   Vaping status: Never Used  Substance and Sexual Activity   Alcohol use: Yes    Comment: wine occ   Drug use: No   Sexual activity: Yes    Birth control/protection: None  Other Topics Concern   Not on file  Social History Narrative   Not on file   Social Determinants of Health   Financial Resource Strain: Low Risk  (01/29/2023)   Received from [facility], [facility]   Overall Financial Resource Strain (CARDIA)    Difficulty of Paying Living Expenses: Not very hard  Food Insecurity: No Food Insecurity (01/29/2023)   Received from [facility], [facility]   Hunger Vital Sign    Worried About Running Out of Food in the Last Year: Never true    Ran Out of Food in the Last Year: Never true  Transportation Needs: No Transportation Needs (01/29/2023)   Received from [facility], [facility]   PRAPARE - Transportation    Lack of Transportation (Medical): No    Lack of Transportation (Non-Medical):  No  Physical Activity: Insufficiently Active (01/29/2023)   Received from [facility], [facility]   Exercise Vital Sign    Days of Exercise  per Week: 1 day    Minutes of Exercise per Session: 10 min  Stress: No Stress Concern Present (01/29/2023)   Received from [facility], [facility] - Occupational Stress Questionnaire    Feeling of Stress : Not at all  Social Connections: Socially Integrated (01/29/2023)   Received from [facility], [facility]   Social Network    How would you rate your social network (family, work, friends)?: Good participation with social networks   Today's Vitals   04/20/23 0734  BP: 138/82  Pulse: 95  Resp: 12  SpO2: 98%  Height: 5\' 3"  (1.6 m)   Wt Readings from Last 3 Encounters:  03/12/23 212 lb (96.2 kg)  03/07/23 213 lb 1.6 oz (96.7 kg)  01/09/23 208 lb (94.3 kg)   Body mass index is 37.55 kg/m.  Physical Exam Vitals and nursing note reviewed.  Constitutional:      General: She is not in acute distress.    Appearance: She is well-developed.  HENT:     Head: Normocephalic and atraumatic.     Mouth/Throat:     Mouth: Mucous membranes are moist.     Pharynx: Oropharynx is clear.  Eyes:     Conjunctiva/sclera: Conjunctivae normal.  Cardiovascular:     Rate and Rhythm: Normal rate and regular rhythm.     Pulses:          Dorsalis pedis pulses are 2+ on the right side and 2+ on the left side.     Heart sounds: No murmur heard. Pulmonary:     Effort: Pulmonary effort is normal. No respiratory distress.     Breath sounds: Normal breath sounds.  Abdominal:     Palpations: Abdomen is soft. There is no hepatomegaly or mass.     Tenderness: There is no abdominal tenderness.  Musculoskeletal:     Right lower leg: No edema.     Left lower leg: No edema.  Lymphadenopathy:     Cervical: No cervical adenopathy.  Skin:    General: Skin is warm.     Findings: No erythema or rash.  Neurological:     General: No focal deficit present.     Mental Status: She is alert and oriented to person, place, and time.     Cranial Nerves: No cranial  nerve deficit.     Comments: Antalgic gait, not assisted.  Psychiatric:        Mood and Affect: Affect normal. Mood is anxious.    ASSESSMENT AND PLAN: Ms. Brindle Leyba was seen today for management of chronic conditions.   Orders Placed This Encounter  Procedures   Microalbumin / creatinine urine ratio   CBC   TSH   Comprehensive metabolic panel   Lab Results  Component Value Date   TSH 2.28 04/20/2023   Lab Results  Component Value Date   WBC 6.9 04/20/2023   HGB 12.3 04/20/2023   HCT 38.2 04/20/2023   MCV 88.8 04/20/2023   PLT 249.0 04/20/2023   Lab Results  Component Value Date   NA 138 04/20/2023   CL 100 04/20/2023   K 3.9 04/20/2023   CO2 30 04/20/2023   BUN 21 04/20/2023   CREATININE 0.84 04/20/2023   GFR 81.49 04/20/2023   CALCIUM 9.1 04/20/2023   ALBUMIN 4.1 04/20/2023  GLUCOSE 128 (H) 04/20/2023   Lab Results  Component Value Date   ALT 19 04/20/2023   AST 18 04/20/2023   ALKPHOS 52 04/20/2023   BILITOT 0.4 04/20/2023   Short of breath on exertion Assessment & Plan: An episode recently while waking a hill. We discussed possible causes?  Deconditioning. Echo in 01/2022 LVEF 60 to 65%, normal diastolic function, no regional wall motion abnormalities. Lung auscultation normal. If persistent or if problem gets worse, cardiology consultation may be necessary. Instructed about warning signs.   Essential (primary) hypertension Assessment & Plan: BP mildly elevated today, reporting higher BPs at home. Today chlorthalidone 25 mg added. Continue losartan 25 mg daily. Low-salt diet is also recommended. Continue monitoring BPs regularly. Follow-up in 3 to 4 months.  Orders: -     CBC; Future -     Chlorthalidone; Take 1 tablet (25 mg total) by mouth daily.  Dispense: 90 tablet; Refill: 0 -     TSH; Future -     Comprehensive metabolic panel; Future  Gastroesophageal reflux disease without esophagitis Assessment & Plan: Problem has  greatly improved with omeprazole 40 mg but still having mid upper chest discomfort once per week at night. She agrees with changing omeprazole for pantoprazole 40 mg 30 minutes before breakfast. Continue GERD precautions. She will let me know in about 3 to 4 weeks if new PPI helps.  Orders: -     Pantoprazole Sodium; Take 1 tablet (40 mg total) by mouth daily.  Dispense: 30 tablet; Refill: 1  Nausea without vomiting Assessment & Plan: This is a chronic problem, she reports improvement, no longer having episodes of vomiting. She takes Zofran about once every 1 to 3 months, no changes today.  Orders: -     TSH; Future  Bilateral lower extremity edema Assessment & Plan: We discussed possible etiologies. History and examination do not suggest a serious process, no edema noted today. Most likely related with venous insufficiency. Furosemide 20 mg did not help, do not recommend increasing the dose due to risk of side effects. Chlorthalidone 25 mg added today for better BP control. Comp patient stockings and LE elevation recommended.  Orders: -     Microalbumin / creatinine urine ratio; Future -     CBC; Future -     Chlorthalidone; Take 1 tablet (25 mg total) by mouth daily.  Dispense: 90 tablet; Refill: 0  Patient requested diagnostic testing -     TSH; Future  Venous insufficiency of both lower extremities Assessment & Plan: She has already established with vascular. We discussed diagnosis, prognosis, and treatment options. Recommend wearing compression stockings regularly.   Major depressive disorder, recurrent episode, moderate (HCC) Assessment & Plan: Problem is overall stable, aggravated by health problems. Continue Duloxetine 90 mg daily. Continue CBT.   Chronic generalized pain disorder Assessment & Plan: Problem is getting worse. She follows with rheumatologist and pain management. On Tramadol,Gabapentin,and Duloxetine. Considering applying for disability,  recommend to discuss this with her pain management and rheumatologist. We can arrange appt with OT for disability evaluation.   I spent a total of 46 minutes in both face to face and non face to face activities for this visit on the date of this encounter. During this time history was obtained and documented, examination was performed, prior labs/imaging reviewed, and assessment/plan discussed.  Return in about 4 months (around 08/20/2023).  I,Rachel Rivera,acting as a scribe for Thales Knipple [place], MD.,have documented all relevant documentation on the behalf of Dallis Czaja [place], MD,as  directed by  Trigo Winterbottom [place], MD while in the presence of Edris Schneck [place], MD.  I, Neng Albee [place], MD, have reviewed all documentation for this visit. The documentation on 04/20/23 for the exam, diagnosis, procedures, and orders are all accurate and complete.  Garen Woolbright G. [place], MD  [facility]. [place] office.

## 2023-04-20 NOTE — Assessment & Plan Note (Signed)
BP mildly elevated today, reporting higher BPs at home. Today chlorthalidone 25 mg added. Continue losartan 25 mg daily. Low-salt diet is also recommended. Continue monitoring BPs regularly. Follow-up in 3 to 4 months.

## 2023-04-25 DIAGNOSIS — Z6839 Body mass index (BMI) 39.0-39.9, adult: Secondary | ICD-10-CM | POA: Diagnosis not present

## 2023-04-25 DIAGNOSIS — E66812 Obesity, class 2: Secondary | ICD-10-CM | POA: Diagnosis not present

## 2023-04-26 DIAGNOSIS — Z1231 Encounter for screening mammogram for malignant neoplasm of breast: Secondary | ICD-10-CM | POA: Diagnosis not present

## 2023-04-26 DIAGNOSIS — Z01419 Encounter for gynecological examination (general) (routine) without abnormal findings: Secondary | ICD-10-CM | POA: Diagnosis not present

## 2023-04-26 DIAGNOSIS — Z6837 Body mass index (BMI) 37.0-37.9, adult: Secondary | ICD-10-CM | POA: Diagnosis not present

## 2023-04-26 DIAGNOSIS — Z13228 Encounter for screening for other metabolic disorders: Secondary | ICD-10-CM | POA: Diagnosis not present

## 2023-04-26 DIAGNOSIS — Z131 Encounter for screening for diabetes mellitus: Secondary | ICD-10-CM | POA: Diagnosis not present

## 2023-04-26 DIAGNOSIS — Z1329 Encounter for screening for other suspected endocrine disorder: Secondary | ICD-10-CM | POA: Diagnosis not present

## 2023-04-26 DIAGNOSIS — Z13220 Encounter for screening for lipoid disorders: Secondary | ICD-10-CM | POA: Diagnosis not present

## 2023-05-01 ENCOUNTER — Other Ambulatory Visit: Payer: Self-pay | Admitting: Obstetrics & Gynecology

## 2023-05-01 DIAGNOSIS — N921 Excessive and frequent menstruation with irregular cycle: Secondary | ICD-10-CM | POA: Diagnosis not present

## 2023-05-01 DIAGNOSIS — N83202 Unspecified ovarian cyst, left side: Secondary | ICD-10-CM | POA: Diagnosis not present

## 2023-05-01 DIAGNOSIS — R928 Other abnormal and inconclusive findings on diagnostic imaging of breast: Secondary | ICD-10-CM

## 2023-05-01 DIAGNOSIS — Z719 Counseling, unspecified: Secondary | ICD-10-CM | POA: Diagnosis not present

## 2023-05-05 DIAGNOSIS — R000 Tachycardia, unspecified: Secondary | ICD-10-CM | POA: Diagnosis not present

## 2023-05-05 DIAGNOSIS — B9689 Other specified bacterial agents as the cause of diseases classified elsewhere: Secondary | ICD-10-CM | POA: Diagnosis not present

## 2023-05-05 DIAGNOSIS — R509 Fever, unspecified: Secondary | ICD-10-CM | POA: Diagnosis not present

## 2023-05-05 DIAGNOSIS — J028 Acute pharyngitis due to other specified organisms: Secondary | ICD-10-CM | POA: Diagnosis not present

## 2023-05-08 DIAGNOSIS — H01025 Squamous blepharitis left lower eyelid: Secondary | ICD-10-CM | POA: Diagnosis not present

## 2023-05-08 DIAGNOSIS — H1045 Other chronic allergic conjunctivitis: Secondary | ICD-10-CM | POA: Diagnosis not present

## 2023-05-08 DIAGNOSIS — H526 Other disorders of refraction: Secondary | ICD-10-CM | POA: Diagnosis not present

## 2023-05-08 DIAGNOSIS — H5022 Vertical strabismus, left eye: Secondary | ICD-10-CM | POA: Diagnosis not present

## 2023-05-08 DIAGNOSIS — H43823 Vitreomacular adhesion, bilateral: Secondary | ICD-10-CM | POA: Diagnosis not present

## 2023-05-08 DIAGNOSIS — H01021 Squamous blepharitis right upper eyelid: Secondary | ICD-10-CM | POA: Diagnosis not present

## 2023-05-08 DIAGNOSIS — Z79899 Other long term (current) drug therapy: Secondary | ICD-10-CM | POA: Diagnosis not present

## 2023-05-08 DIAGNOSIS — Z83511 Family history of glaucoma: Secondary | ICD-10-CM | POA: Diagnosis not present

## 2023-05-11 ENCOUNTER — Ambulatory Visit
Admission: RE | Admit: 2023-05-11 | Discharge: 2023-05-11 | Disposition: A | Discharge status: Home / Self Care | Payer: 59 | Source: Ambulatory Visit | Attending: Obstetrics & Gynecology | Admitting: Obstetrics & Gynecology

## 2023-05-11 ENCOUNTER — Ambulatory Visit: Payer: 59

## 2023-05-11 DIAGNOSIS — R928 Other abnormal and inconclusive findings on diagnostic imaging of breast: Secondary | ICD-10-CM | POA: Diagnosis not present

## 2023-05-17 DIAGNOSIS — M0609 Rheumatoid arthritis without rheumatoid factor, multiple sites: Secondary | ICD-10-CM | POA: Diagnosis not present

## 2023-05-18 ENCOUNTER — Other Ambulatory Visit: Payer: Self-pay

## 2023-05-18 DIAGNOSIS — K219 Gastro-esophageal reflux disease without esophagitis: Secondary | ICD-10-CM

## 2023-05-18 MED ORDER — PANTOPRAZOLE SODIUM 40 MG PO TBEC: 40.0000 mg | DELAYED_RELEASE_TABLET | Freq: Every day | ORAL | 1 refills | Status: DC

## 2023-05-21 ENCOUNTER — Ambulatory Visit (INDEPENDENT_AMBULATORY_CARE_PROVIDER_SITE_OTHER): Payer: 59 | Admitting: *Deleted

## 2023-05-21 DIAGNOSIS — J309 Allergic rhinitis, unspecified: Secondary | ICD-10-CM

## 2023-05-22 DIAGNOSIS — Z79899 Other long term (current) drug therapy: Secondary | ICD-10-CM | POA: Diagnosis not present

## 2023-05-22 DIAGNOSIS — M0609 Rheumatoid arthritis without rheumatoid factor, multiple sites: Secondary | ICD-10-CM | POA: Diagnosis not present

## 2023-05-22 DIAGNOSIS — R600 Localized edema: Secondary | ICD-10-CM | POA: Diagnosis not present

## 2023-05-22 DIAGNOSIS — N83201 Unspecified ovarian cyst, right side: Secondary | ICD-10-CM | POA: Diagnosis not present

## 2023-05-28 DIAGNOSIS — N83202 Unspecified ovarian cyst, left side: Secondary | ICD-10-CM | POA: Diagnosis not present

## 2023-05-28 DIAGNOSIS — N921 Excessive and frequent menstruation with irregular cycle: Secondary | ICD-10-CM | POA: Diagnosis not present

## 2023-05-28 DIAGNOSIS — F32A Depression, unspecified: Secondary | ICD-10-CM | POA: Diagnosis not present

## 2023-05-29 DIAGNOSIS — M5412 Radiculopathy, cervical region: Secondary | ICD-10-CM | POA: Diagnosis not present

## 2023-05-29 DIAGNOSIS — M546 Pain in thoracic spine: Secondary | ICD-10-CM | POA: Diagnosis not present

## 2023-05-29 DIAGNOSIS — G894 Chronic pain syndrome: Secondary | ICD-10-CM | POA: Diagnosis not present

## 2023-05-29 DIAGNOSIS — M5416 Radiculopathy, lumbar region: Secondary | ICD-10-CM | POA: Diagnosis not present

## 2023-05-31 DIAGNOSIS — M0609 Rheumatoid arthritis without rheumatoid factor, multiple sites: Secondary | ICD-10-CM | POA: Diagnosis not present

## 2023-06-05 ENCOUNTER — Other Ambulatory Visit: Payer: Self-pay | Admitting: Allergy

## 2023-06-12 ENCOUNTER — Ambulatory Visit (INDEPENDENT_AMBULATORY_CARE_PROVIDER_SITE_OTHER): Payer: 59 | Admitting: *Deleted

## 2023-06-12 DIAGNOSIS — J309 Allergic rhinitis, unspecified: Secondary | ICD-10-CM | POA: Diagnosis not present

## 2023-06-12 NOTE — Progress Notes (Signed)
VIALS EXP 06-11-24

## 2023-06-13 DIAGNOSIS — J3089 Other allergic rhinitis: Secondary | ICD-10-CM | POA: Diagnosis not present

## 2023-06-14 DIAGNOSIS — M0609 Rheumatoid arthritis without rheumatoid factor, multiple sites: Secondary | ICD-10-CM | POA: Diagnosis not present

## 2023-06-28 DIAGNOSIS — R600 Localized edema: Secondary | ICD-10-CM | POA: Diagnosis not present

## 2023-06-28 DIAGNOSIS — N83201 Unspecified ovarian cyst, right side: Secondary | ICD-10-CM | POA: Diagnosis not present

## 2023-06-28 DIAGNOSIS — M0609 Rheumatoid arthritis without rheumatoid factor, multiple sites: Secondary | ICD-10-CM | POA: Diagnosis not present

## 2023-06-28 DIAGNOSIS — Z79899 Other long term (current) drug therapy: Secondary | ICD-10-CM | POA: Diagnosis not present

## 2023-07-02 DIAGNOSIS — G894 Chronic pain syndrome: Secondary | ICD-10-CM | POA: Diagnosis not present

## 2023-07-02 DIAGNOSIS — M546 Pain in thoracic spine: Secondary | ICD-10-CM | POA: Diagnosis not present

## 2023-07-02 DIAGNOSIS — M5412 Radiculopathy, cervical region: Secondary | ICD-10-CM | POA: Diagnosis not present

## 2023-07-02 DIAGNOSIS — M5416 Radiculopathy, lumbar region: Secondary | ICD-10-CM | POA: Diagnosis not present

## 2023-07-06 ENCOUNTER — Ambulatory Visit (INDEPENDENT_AMBULATORY_CARE_PROVIDER_SITE_OTHER): Payer: Self-pay

## 2023-07-06 DIAGNOSIS — J309 Allergic rhinitis, unspecified: Secondary | ICD-10-CM

## 2023-07-13 DIAGNOSIS — M0609 Rheumatoid arthritis without rheumatoid factor, multiple sites: Secondary | ICD-10-CM | POA: Diagnosis not present

## 2023-07-14 ENCOUNTER — Other Ambulatory Visit: Payer: Self-pay | Admitting: Family Medicine

## 2023-07-14 DIAGNOSIS — I10 Essential (primary) hypertension: Secondary | ICD-10-CM

## 2023-07-14 DIAGNOSIS — R600 Localized edema: Secondary | ICD-10-CM

## 2023-07-16 ENCOUNTER — Ambulatory Visit (INDEPENDENT_AMBULATORY_CARE_PROVIDER_SITE_OTHER): Payer: 59

## 2023-07-16 DIAGNOSIS — J309 Allergic rhinitis, unspecified: Secondary | ICD-10-CM

## 2023-07-20 DIAGNOSIS — M25562 Pain in left knee: Secondary | ICD-10-CM | POA: Diagnosis not present

## 2023-07-30 ENCOUNTER — Other Ambulatory Visit: Payer: Self-pay | Admitting: Family Medicine

## 2023-07-30 ENCOUNTER — Other Ambulatory Visit: Payer: Self-pay

## 2023-07-30 ENCOUNTER — Other Ambulatory Visit (HOSPITAL_COMMUNITY): Payer: Self-pay

## 2023-07-30 DIAGNOSIS — Z6841 Body Mass Index (BMI) 40.0 and over, adult: Secondary | ICD-10-CM | POA: Diagnosis not present

## 2023-07-30 DIAGNOSIS — E66813 Obesity, class 3: Secondary | ICD-10-CM | POA: Diagnosis not present

## 2023-07-30 MED ORDER — DULOXETINE HCL 60 MG PO CPEP
60.0000 mg | ORAL_CAPSULE | Freq: Every day | ORAL | 2 refills | Status: DC
  Filled 2023-07-30: qty 30, 30d supply, fill #0

## 2023-07-30 MED ORDER — FEXOFENADINE HCL 180 MG PO TABS
180.0000 mg | ORAL_TABLET | Freq: Two times a day (BID) | ORAL | 1 refills | Status: DC
  Filled 2023-07-30: qty 60, 30d supply, fill #0

## 2023-07-31 ENCOUNTER — Encounter: Payer: Self-pay | Admitting: Pharmacist

## 2023-07-31 ENCOUNTER — Other Ambulatory Visit: Payer: Self-pay

## 2023-08-02 ENCOUNTER — Other Ambulatory Visit: Payer: Self-pay

## 2023-08-08 ENCOUNTER — Encounter: Payer: Self-pay | Admitting: Family Medicine

## 2023-08-08 ENCOUNTER — Ambulatory Visit: Payer: 59 | Admitting: Family Medicine

## 2023-08-08 ENCOUNTER — Ambulatory Visit (INDEPENDENT_AMBULATORY_CARE_PROVIDER_SITE_OTHER): Payer: Self-pay | Admitting: *Deleted

## 2023-08-08 VITALS — BP 120/70 | HR 100 | Resp 20 | Ht 63.0 in | Wt 222.0 lb

## 2023-08-08 DIAGNOSIS — F331 Major depressive disorder, recurrent, moderate: Secondary | ICD-10-CM | POA: Diagnosis not present

## 2023-08-08 DIAGNOSIS — R600 Localized edema: Secondary | ICD-10-CM

## 2023-08-08 DIAGNOSIS — I10 Essential (primary) hypertension: Secondary | ICD-10-CM | POA: Diagnosis not present

## 2023-08-08 DIAGNOSIS — R609 Edema, unspecified: Secondary | ICD-10-CM

## 2023-08-08 DIAGNOSIS — J309 Allergic rhinitis, unspecified: Secondary | ICD-10-CM

## 2023-08-08 DIAGNOSIS — Z6839 Body mass index (BMI) 39.0-39.9, adult: Secondary | ICD-10-CM | POA: Diagnosis not present

## 2023-08-08 DIAGNOSIS — R0602 Shortness of breath: Secondary | ICD-10-CM

## 2023-08-08 DIAGNOSIS — M47812 Spondylosis without myelopathy or radiculopathy, cervical region: Secondary | ICD-10-CM | POA: Diagnosis not present

## 2023-08-08 DIAGNOSIS — M069 Rheumatoid arthritis, unspecified: Secondary | ICD-10-CM

## 2023-08-08 HISTORY — DX: Edema, unspecified: R60.9

## 2023-08-08 MED ORDER — DULOXETINE HCL 60 MG PO CPEP: 60.0000 mg | ORAL_CAPSULE | Freq: Every day | ORAL | 2 refills | Status: DC

## 2023-08-08 NOTE — Assessment & Plan Note (Signed)
 Chronic, reports that it is getting worse. We discussed possible causes including deconditioning, asthma among some. Echo in 01/2022 LVEF 60 to 65%, normal diastolic function, no regional wall motion abnormalities. Tomorrow she will come back for CXR and labs. We can consider pulmonology and/or cardiology evaluation. Instructed about warning signs.

## 2023-08-08 NOTE — Progress Notes (Signed)
ACUTE VISIT Chief Complaint  Patient presents with   weight   HPI: [name] is a 48 y.o. female with a PMHx significant for HTN, chronic pain disorder, IBS-C/D, GERD, asthma, and anxiety, who is here today to discuss weight loss.   She is following with several doctors, including rheumatology, pain management, vascular, and a spinal surgeon.   Weight loss:  Patient has recently started Wegovy 0.25 mg weekly prescribed by [facility].  Exercise: She says with the improvement of her pain, she has been able to exercise more, sometimes walking 3-4 miles at a time.  Diet: She says she has been eating healthier.  Even with the medication and changes to her diet and exercise habits, she states she is still not losing weight.  She would like to have blood work done.  Lab Results  Component Value Date   TSH 2.28 04/20/2023   Lower extremity edema:  She is concerned her edema is worsening. She says her swelling is worse in the morning, and her legs feel warm and heavy in the morning. She endorses some soreness in her lower legs.  She also mentions she is having occasional swelling in her arms.   She does not believe her chlorthalidone is helping with the swelling.  She wears compression stockings all day at work.   She saw vascular, and according to pt, she was advised to have vein surgery.   She had an Echocardiogram done in 01/2022: 60-65% and normal diastolic dysfunction. .  Denies orthopnea ,PND,gross hematuria, foam in urine,or decreased urine output. Lab Results  Component Value Date   NA 138 04/20/2023   CL 100 04/20/2023   K 3.9 04/20/2023   CO2 30 04/20/2023   BUN 21 04/20/2023   CREATININE 0.84 04/20/2023   GFR 81.49 04/20/2023   CALCIUM 9.1 04/20/2023   ALBUMIN 4.1 04/20/2023   GLUCOSE 128 (H) 04/20/2023   Lab Results  Component Value Date   ALT 19 04/20/2023   AST 18 04/20/2023   ALKPHOS 52 04/20/2023   BILITOT 0.4 04/20/2023    Shortness of breath:  Patient also complains of frequent shortness of breath.  Problem has been going on for a while. She is not checking her oxygen at home, but has had it checked most weeks at various doctor's appointments, and says it is usually 94-96%.  She says her SOB is worsened by bending over to tie her shoes.   Denies any recent heartburn and mentions she is on pantoprazole 40 mg daily and helping with symptoms.   Chronic pain/arthralgias:  In general, she says her pain is much better controlled.  She has been put on hydrocodone-acetaminophen 10-325 mg 4x per day.  Also on duloxetine 60 mg daily. She changed from 90 mg to 60 mg recently.  She needs a refill. She has also started having Orencia infusions for her rheumatoid arthritis.  She mentions she is having an MRI for neck pain in February due to persistent cervical pain.   Scalp lesions:  She also complains of reddish, circular lesions on her scalp that have been present for about a year. Denies itching or pain.   Hypertension:  Medications: Currently on chlorthalidone 25 mg daily. She has not been taking Losartan for about 2 months.  BP readings at home: She has been checking her BP at home and says it is good. She attributes much of her improvement to better management of her pain.  Negative for unusual or severe headache, visual  changes, exertional chest pain, or focal weakness.  Review of Systems  Constitutional:  Positive for activity change and fatigue. Negative for appetite change and fever.  HENT:  Negative for sore throat and trouble swallowing.   Respiratory:  Negative for cough and wheezing.   Gastrointestinal:  Negative for abdominal pain, nausea and vomiting.  Endocrine: Negative for cold intolerance and heat intolerance.  Musculoskeletal:  Positive for arthralgias, back pain, myalgias and neck pain. Negative for gait problem.  Skin:  Negative for rash.  Neurological:  Negative for syncope and facial  asymmetry.  Psychiatric/Behavioral:  Negative for confusion and hallucinations.   See other pertinent positives and negatives in HPI.  Current Outpatient Medications on File Prior to Visit  Medication Sig Dispense Refill   albuterol (PROVENTIL HFA) 108 (90 Base) MCG/ACT inhaler Inhale 2 puffs into the lungs every 4 (four) hours as needed for wheezing or shortness of breath. 54 g 1   albuterol (PROVENTIL) (2.5 MG/3ML) 0.083% nebulizer solution Use 1 unit dose via the nebulizer every 6 hours as needed for cough, wheeze, tightness in chest, or shortness of breath. 75 mL 3   Albuterol-Budesonide (AIRSUPRA) 90-80 MCG/ACT AERO Inhale 2 puffs into the lungs every 4 (four) hours as needed (cough, wheeze, shortness of breath). 32.1 g 1   AUVI-Q 0.3 MG/0.3ML SOAJ injection Inject 0.3 mg into the muscle as needed for anaphylaxis. 1 each 1   BREZTRI AEROSPHERE 160-9-4.8 MCG/ACT AERO Inhale 2 puffs into the lungs in the morning and at bedtime. 32.1 g 1   budesonide (PULMICORT) 0.5 MG/2ML nebulizer solution TAKE 2 MLS (0.5 MG TOTAL) BY NEBULIZATION 3 (THREE) TIMES DAILY AS NEEDED (ASTHMA FLARE). 540 mL 1   celecoxib (CELEBREX) 100 MG capsule Take 100 mg by mouth 2 (two) times daily.     chlorthalidone (HYGROTON) 25 MG tablet TAKE 1 TABLET (25 MG TOTAL) BY MOUTH DAILY. 90 tablet 1   docusate sodium (COLACE) 100 MG capsule Take 1 capsule (100 mg total) by mouth every 12 (twelve) hours. 60 capsule 0   EPINEPHrine (AUVI-Q) 0.3 mg/0.3 mL IJ SOAJ injection Inject 0.3 mg into the muscle as needed for anaphylaxis. 2 each 1   fexofenadine (ALLEGRA) 180 MG tablet Take 1 tablet (180 mg total) by mouth 2 (two) times daily. 180 tablet 1   folic acid (FOLVITE) 1 MG tablet Take 1 mg by mouth daily. Everyday but Thursdays     Golimumab (SIMPONI ARIA IV) Inject into the vein.     golimumab (SIMPONI ARIA) 50 MG/4ML SOLN injection Inject 50 mg into the vein every 8 (eight) weeks.     HYDROcodone-acetaminophen (NORCO) 10-325 MG  tablet Take 1 tablet by mouth 4 (four) times daily.     hydroxychloroquine (PLAQUENIL) 200 MG tablet Take 200 mg by mouth 2 (two) times daily.     methotrexate (RHEUMATREX) 2.5 MG tablet Take 10 mg by mouth once a week. Caution:Chemotherapy. Protect from light. Takes on Thursdays     mometasone (NASONEX) 50 MCG/ACT nasal spray 1 spray each nostril twice daily for nasal congestion control. 51 g 1   ondansetron (ZOFRAN-ODT) 4 MG disintegrating tablet Take 1 tablet (4 mg total) by mouth every 8 (eight) hours as needed for nausea or vomiting. 30 tablet 0   pantoprazole (PROTONIX) 40 MG tablet Take 1 tablet (40 mg total) by mouth daily. 90 tablet 1   predniSONE (DELTASONE) 5 MG tablet Take 10 mg by mouth daily.     pregabalin (LYRICA) 50 MG capsule Take  50 mg by mouth 3 (three) times daily as needed.     UNABLE TO FIND 2 (two) times a week. Med Name: ALLERGY SHOTS     VITAMIN D PO Take by mouth daily.     WEGOVY 0.25 MG/0.5ML SOAJ Inject 0.25 mg into the skin once a week.     No current facility-administered medications on file prior to visit.    Past Medical History:  Diagnosis Date   Abnormal Pap smear    Anxiety    no meds   Asthma    mild, no issue for years no  inhaler use except in winter   Biliary colic    Blood type, Rh negative    Gallstones    H/O dysmenorrhea    Infertility, female    Nausea and vomiting    Pain    back and chest realted to gallbladder since november 2018   PONV (postoperative nausea and vomiting)    severe, needs heavy anti nausea meds   Spinal stenosis    Allergies  Allergen Reactions   Promethazine Other (See Comments)    Hallucinations   Theophyllines Nausea And Vomiting   Mite (D. 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, [facility]   Overall Financial Resource Strain (CARDIA)    Difficulty of Paying Living Expenses: Not very hard  Food Insecurity: No Food Insecurity (01/29/2023)   Received from [facility], [facility]   Hunger Vital Sign    Worried About Running Out of Food in the Last Year: Never true    Ran Out of Food in the Last Year: Never true  Transportation Needs: No Transportation Needs (01/29/2023)   Received from [facility], [facility]   PRAPARE - Transportation    Lack of Transportation (Medical): No    Lack of Transportation (Non-Medical): No  Physical Activity: Insufficiently Active (01/29/2023)   Received from [facility], [facility]   Exercise Vital Sign    Days of Exercise per Week: 1 day    Minutes of Exercise per Session: 10 min  Stress: No Stress Concern Present (01/29/2023)   Received from [facility], [facility] of Occupational Health - Occupational Stress Questionnaire    Feeling of Stress : Not at all  Social Connections: Socially Integrated (01/29/2023)   Received from [facility], [facility]   Social Network    How would you rate your social network (family, work, friends)?: Good participation with social networks   Vitals:   08/08/23 1616  BP:  120/70  Pulse: 100  Resp: 20  SpO2: 97%   Body mass index is 39.33 kg/m.  Physical Exam Vitals and nursing note reviewed.   Constitutional:      General: She is not in acute distress.    Appearance: She is well-developed.  HENT:     Head: Normocephalic and atraumatic.     Mouth/Throat:     Mouth: Mucous membranes are moist.     Pharynx: Oropharynx is clear. Uvula midline.  Eyes:     Conjunctiva/sclera: Conjunctivae normal.  Neck:     Vascular: No JVD.  Cardiovascular:     Rate and Rhythm: Normal rate and regular rhythm.     Pulses:          Dorsalis pedis pulses are 2+ on the right side and 2+ on the left side.     Heart sounds: No murmur heard.    Comments: Mild varicose veins LE's. Pulmonary:     Effort: Pulmonary effort is normal. No respiratory distress.     Breath sounds: Normal breath sounds.     Comments: Tachypnea at times during her visit.  Abdominal:     Palpations: Abdomen is soft. There is no hepatomegaly or mass.     Tenderness: There is no abdominal tenderness.  Musculoskeletal:     Right lower leg: No edema.     Left lower leg: No edema.  Lymphadenopathy:     Cervical: No cervical adenopathy.  Skin:    General: Skin is warm.     Findings: No erythema or rash.          Comments: Right low and upper occipital papular reddish lesion 13  and mm respectively.  Neurological:     General: No focal deficit present.     Mental Status: She is alert and oriented to person, place, and time.     Cranial Nerves: No cranial nerve deficit.     Gait: Gait normal.  Psychiatric:        Mood and Affect: Affect normal. Mood is anxious.   ASSESSMENT AND PLAN:  Ms. Onnen was seen today to discuss weight loss.   Lab Results  Component Value Date   MICROALBUR 2.0 (H) 08/09/2023   MICROALBUR 2.1 (H) 04/20/2023   Lab Results  Component Value Date   NA 137 08/09/2023   CL 96 08/09/2023   K 3.8 08/09/2023   CO2 33 (H) 08/09/2023   BUN 21 08/09/2023   CREATININE 0.84 08/09/2023   GFR 81.32 08/09/2023   CALCIUM 9.8 08/09/2023   ALBUMIN 4.1 04/20/2023   GLUCOSE 104 (H) 08/09/2023    Lab Results  Component Value Date   TSH 1.34 08/09/2023   SOB (shortness of breath) Assessment & Plan: Chronic, reports that it is getting worse. We discussed possible causes including deconditioning, asthma among some. Echo in 01/2022 LVEF 60 to 65%, normal diastolic function, no regional wall motion abnormalities. Tomorrow she will come back for CXR and labs. We can consider pulmonology and/or cardiology evaluation. Instructed about warning signs.  Orders: -     Basic metabolic panel; Future -     TSH; Future -     Brain natriuretic peptide; Future -     DG Chest 2 View; Future  Edema, unspecified type Assessment & Plan: Today I do not appreciate edema. We discussed possible causes. Continue low salt diet. Further recommendations according to lab results.  Orders: -     Microalbumin / creatinine urine ratio; Future  Essential (primary)  hypertension Assessment & Plan: BP has improved since pain has been better controlled. She is not longer on Losartan. Continue Chlorthalidone 25 mg daily and low salt diet.   Bilateral lower extremity edema Assessment & Plan: We discussed possible etiologies. I do not appreciate edema at this time. She has seen vascular, hx of venous insufficiency. Compression stocking 25-30 mmHg to continue and LE elevation a few times during the day. Continue appropriate skin care.   Major depressive disorder, recurrent episode, moderate (HCC) Assessment & Plan: Aggravated by complex medical hx. She has decreased dose of Duloxetine from 90 mg daily to 60 mg daily.  Orders: -     DULoxetine HCl; Take 1 capsule (60 mg total) by mouth daily.  Dispense: 90 capsule; Refill: 2  Severe obesity (BMI 35.0-39.9) with comorbidity [facility]) Assessment & Plan: She understands the benefits of wt loss as well as adverse effects of obesity. Consistency with healthy diet and physical activity encouraged. Following with wt loss clinic and recently started on  Wagovy.   Rheumatoid arthritis of both wrists, unspecified whether rheumatoid factor present [facility]) Assessment & Plan: Joint pain has improved since started on Orencia. Following with rheumatologist.   In regard to scalp lesions, I do not tsee suspicious lesions, 2 hemangiomas, cherry moles. Reassured, continue monitoring for changes.  I spent a total of 43 minutes in both face to face and non face to face activities for this visit on the date of this encounter. During this time history was obtained and documented, examination was performed, prior labs/imaging reviewed, and assessment/plan discussed.  Return if symptoms worsen or fail to improve.  I, Rolla Etienne Wierda, acting as a scribe for Ryen Heitmeyer [place], MD., have documented all relevant documentation on the behalf of Talita Recht [place], MD, as directed by  Zooey Schreurs [place], MD while in the presence of Renalda Locklin [place], MD.   I, Nneka Blanda [place], MD, have reviewed all documentation for this visit. The documentation on 08/08/23 for the exam, diagnosis, procedures, and orders are all accurate and complete.  Wendall Isabell G. [place], MD  [facility]. [place] office.

## 2023-08-08 NOTE — Assessment & Plan Note (Addendum)
 She understands the benefits of wt loss as well as adverse effects of obesity. Consistency with healthy diet and physical activity encouraged. Following with wt loss clinic and recently started on Wagovy.

## 2023-08-08 NOTE — Assessment & Plan Note (Signed)
 We discussed possible etiologies. I do not appreciate edema at this time. She has seen vascular, hx of venous insufficiency. Compression stocking 25-30 mmHg to continue and LE elevation a few times during the day. Continue appropriate skin care.

## 2023-08-08 NOTE — Assessment & Plan Note (Signed)
 Aggravated by complex medical hx. She has decreased dose of Duloxetine  from 90 mg daily to 60 mg daily.

## 2023-08-08 NOTE — Patient Instructions (Signed)
 A few things to remember from today's visit:  SOB (shortness of breath) - Plan: Basic metabolic panel, TSH, Brain Natriuretic Peptide, DG Chest 2 View  Edema, unspecified type - Plan: Microalbumin / creatinine urine ratio  If shortness of breath is persistent, we can consider appt with pulmonologist. Lesions on scalp area cherry moles.  If you need refills for medications you take chronically, please call your pharmacy. Do not use My Chart to request refills or for acute issues that need immediate attention. If you send a my chart message, it may take a few days to be addressed, specially if I am not in the office.  Please be sure medication list is accurate. If a new problem present, please set up appointment sooner than planned today.

## 2023-08-08 NOTE — Assessment & Plan Note (Signed)
 Today I do not appreciate edema. We discussed possible causes. Continue low salt diet. Further recommendations according to lab results.

## 2023-08-08 NOTE — Assessment & Plan Note (Signed)
 BP has improved since pain has been better controlled. She is not longer on Losartan . Continue Chlorthalidone  25 mg daily and low salt diet.

## 2023-08-08 NOTE — Assessment & Plan Note (Signed)
 Joint pain has improved since started on Orencia . Following with rheumatologist.

## 2023-08-09 ENCOUNTER — Other Ambulatory Visit (INDEPENDENT_AMBULATORY_CARE_PROVIDER_SITE_OTHER): Payer: 59

## 2023-08-09 DIAGNOSIS — R0602 Shortness of breath: Secondary | ICD-10-CM

## 2023-08-09 DIAGNOSIS — R609 Edema, unspecified: Secondary | ICD-10-CM | POA: Diagnosis not present

## 2023-08-09 LAB — BRAIN NATRIURETIC PEPTIDE: Pro B Natriuretic peptide (BNP): 12 pg/mL (ref 0.0–100.0)

## 2023-08-09 LAB — BASIC METABOLIC PANEL
BUN: 21 mg/dL (ref 6–23)
CO2: 33 meq/L — ABNORMAL HIGH (ref 19–32)
Calcium: 9.8 mg/dL (ref 8.4–10.5)
Chloride: 96 meq/L (ref 96–112)
Creatinine, Ser: 0.84 mg/dL (ref 0.40–1.20)
GFR: 81.32 mL/min (ref >60.00)
Glucose, Bld: 104 mg/dL — ABNORMAL HIGH (ref 70–99)
Potassium: 3.8 meq/L (ref 3.5–5.1)
Sodium: 137 meq/L (ref 135–145)

## 2023-08-09 LAB — MICROALBUMIN / CREATININE URINE RATIO
Creatinine,U: 154.9 mg/dL
Microalb Creat Ratio: 1.3 mg/g (ref 0.0–30.0)
Microalb, Ur: 2 mg/dL — ABNORMAL HIGH (ref 0.0–1.9)

## 2023-08-09 LAB — TSH: TSH: 1.34 u[IU]/mL (ref 0.35–5.50)

## 2023-08-10 DIAGNOSIS — R0602 Shortness of breath: Secondary | ICD-10-CM

## 2023-08-10 DIAGNOSIS — I10 Essential (primary) hypertension: Secondary | ICD-10-CM

## 2023-08-11 ENCOUNTER — Other Ambulatory Visit: Payer: Self-pay | Admitting: Family Medicine

## 2023-08-17 ENCOUNTER — Other Ambulatory Visit: Payer: Self-pay

## 2023-08-17 DIAGNOSIS — M0609 Rheumatoid arthritis without rheumatoid factor, multiple sites: Secondary | ICD-10-CM | POA: Diagnosis not present

## 2023-08-17 MED ORDER — ONDANSETRON 4 MG PO TBDP
4.0000 mg | ORAL_TABLET | Freq: Every day | ORAL | 0 refills | Status: AC | PRN
  Filled 2023-08-17: qty 30, 30d supply, fill #0

## 2023-08-17 NOTE — Progress Notes (Incomplete)
HPI: [name] is a 48 y.o. female with a PMHx significant for HTN, chronic pain, IBS-C/D, GERD, asthma, and anxiety, who is here today for chronic disease management.  Last seen on 08/08/2023  *** Review of Systems See other pertinent positives and negatives in HPI.  Current Outpatient Medications on File Prior to Visit  Medication Sig Dispense Refill   albuterol (PROVENTIL HFA) 108 (90 Base) MCG/ACT inhaler Inhale 2 puffs into the lungs every 4 (four) hours as needed for wheezing or shortness of breath. 54 g 1   albuterol (PROVENTIL) (2.5 MG/3ML) 0.083% nebulizer solution Use 1 unit dose via the nebulizer every 6 hours as needed for cough, wheeze, tightness in chest, or shortness of breath. 75 mL 3   Albuterol-Budesonide (AIRSUPRA) 90-80 MCG/ACT AERO Inhale 2 puffs into the lungs every 4 (four) hours as needed (cough, wheeze, shortness of breath). 32.1 g 1   AUVI-Q 0.3 MG/0.3ML SOAJ injection Inject 0.3 mg into the muscle as needed for anaphylaxis. 1 each 1   BREZTRI AEROSPHERE 160-9-4.8 MCG/ACT AERO Inhale 2 puffs into the lungs in the morning and at bedtime. 32.1 g 1   budesonide (PULMICORT) 0.5 MG/2ML nebulizer solution TAKE 2 MLS (0.5 MG TOTAL) BY NEBULIZATION 3 (THREE) TIMES DAILY AS NEEDED (ASTHMA FLARE). 540 mL 1   celecoxib (CELEBREX) 100 MG capsule Take 100 mg by mouth 2 (two) times daily.     chlorthalidone (HYGROTON) 25 MG tablet TAKE 1 TABLET (25 MG TOTAL) BY MOUTH DAILY. 90 tablet 1   docusate sodium (COLACE) 100 MG capsule Take 1 capsule (100 mg total) by mouth every 12 (twelve) hours. 60 capsule 0   DULoxetine (CYMBALTA) 60 MG capsule Take 1 capsule (60 mg total) by mouth daily. 90 capsule 2   EPINEPHrine (AUVI-Q) 0.3 mg/0.3 mL IJ SOAJ injection Inject 0.3 mg into the muscle as needed for anaphylaxis. 2 each 1   fexofenadine (ALLEGRA) 180 MG tablet Take 1 tablet (180 mg total) by mouth 2 (two) times daily. 180 tablet 1   folic acid (FOLVITE) 1 MG tablet  Take 1 mg by mouth daily. Everyday but Thursdays     Golimumab (SIMPONI ARIA IV) Inject into the vein.     golimumab (SIMPONI ARIA) 50 MG/4ML SOLN injection Inject 50 mg into the vein every 8 (eight) weeks.     HYDROcodone-acetaminophen (NORCO) 10-325 MG tablet Take 1 tablet by mouth 4 (four) times daily.     hydroxychloroquine (PLAQUENIL) 200 MG tablet Take 200 mg by mouth 2 (two) times daily.     methotrexate (RHEUMATREX) 2.5 MG tablet Take 10 mg by mouth once a week. Caution:Chemotherapy. Protect from light. Takes on Thursdays     mometasone (NASONEX) 50 MCG/ACT nasal spray 1 spray each nostril twice daily for nasal congestion control. 51 g 1   ondansetron (ZOFRAN-ODT) 4 MG disintegrating tablet Take 1 tablet (4 mg total) by mouth every 8 (eight) hours as needed for nausea or vomiting. 30 tablet 0   pantoprazole (PROTONIX) 40 MG tablet Take 1 tablet (40 mg total) by mouth daily. 90 tablet 1   predniSONE (DELTASONE) 5 MG tablet Take 10 mg by mouth daily.     pregabalin (LYRICA) 50 MG capsule Take 50 mg by mouth 3 (three) times daily as needed.     UNABLE TO FIND 2 (two) times a week. Med Name: ALLERGY SHOTS     VITAMIN D PO Take by mouth daily.     WEGOVY 0.25 MG/0.5ML SOAJ Inject  0.25 mg into the skin once a week.     No current facility-administered medications on file prior to visit.    Past Medical History:  Diagnosis Date   Abnormal Pap smear    Anxiety    no meds   Asthma    mild, no issue for years no  inhaler use except in winter   Biliary colic    Blood type, Rh negative    Gallstones    H/O dysmenorrhea    Infertility, female    Nausea and vomiting    Pain    back and chest realted to gallbladder since november 2018   PONV (postoperative nausea and vomiting)    severe, needs heavy anti nausea meds   Spinal stenosis    Allergies  Allergen Reactions   Promethazine Other (See Comments)    Hallucinations   Theophyllines Nausea And Vomiting   Mite (D. 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  Tobacco Use   Smoking status: Never   Smokeless tobacco: Never  Vaping Use   Vaping status: Never Used  Substance and Sexual Activity   Alcohol use: Yes    Comment: wine occ   Drug use: No   Sexual activity: Yes    Birth control/protection: None  Other Topics Concern   Not on file  Social History Narrative   Not on file   Social Drivers of Health   Financial Resource Strain: Low Risk  (01/29/2023)   Received from [facility], [facility]   Overall Financial Resource Strain (CARDIA)    Difficulty of Paying Living Expenses: Not very hard  Food Insecurity: No Food Insecurity (01/29/2023)   Received from [facility], [facility]   Hunger Vital Sign    Worried About Running Out of Food in the Last Year: Never true    Ran Out of Food in the Last Year: Never true  Transportation Needs: No Transportation Needs (01/29/2023)   Received from [facility], [facility]   PRAPARE - Transportation    Lack of Transportation (Medical): No    Lack of Transportation (Non-Medical): No  Physical Activity: Insufficiently Active (01/29/2023)   Received from [facility], [facility]   Exercise Vital Sign    Days of Exercise per Week: 1 day    Minutes of Exercise per Session: 10 min  Stress: No Stress Concern Present (01/29/2023)   Received from [facility], [facility] of Occupational Health - Occupational Stress Questionnaire    Feeling of Stress : Not at all  Social Connections: Socially Integrated (01/29/2023)   Received from [facility], [facility]   Social Network    How would  you rate your social network (family, work, friends)?: Good participation with social networks    There were no vitals filed for this visit. There is no height or weight on file to calculate BMI.  Physical Exam  ASSESSMENT AND PLAN:  Ms. Tortorelli was seen today for chronic disease management.   No orders of the defined types were placed in this encounter.  No problem-specific Assessment & Plan notes found for this encounter.   No follow-ups on file.  I, Rolla Etienne Wierda, acting as a scribe for Betty [place], MD., have documented all relevant documentation on the behalf of Betty [place], MD, as directed by  Betty [place], MD while in the presence of Betty [place], MD.   I, Betty [place], MD, have reviewed all documentation for this visit. The documentation on 08/17/23 for the exam, diagnosis, procedures, and orders are all accurate and complete.  Betty G. [place], MD  [facility]. [place] office.

## 2023-08-18 ENCOUNTER — Other Ambulatory Visit (HOSPITAL_COMMUNITY): Payer: Self-pay

## 2023-08-20 ENCOUNTER — Ambulatory Visit: Payer: 59 | Admitting: Family Medicine

## 2023-08-20 ENCOUNTER — Ambulatory Visit (INDEPENDENT_AMBULATORY_CARE_PROVIDER_SITE_OTHER): Payer: Self-pay | Admitting: *Deleted

## 2023-08-20 DIAGNOSIS — J309 Allergic rhinitis, unspecified: Secondary | ICD-10-CM | POA: Diagnosis not present

## 2023-08-21 ENCOUNTER — Other Ambulatory Visit: Payer: Self-pay

## 2023-08-24 ENCOUNTER — Other Ambulatory Visit: Payer: Self-pay

## 2023-09-10 DIAGNOSIS — E66812 Obesity, class 2: Secondary | ICD-10-CM | POA: Diagnosis not present

## 2023-09-10 DIAGNOSIS — Z6838 Body mass index (BMI) 38.0-38.9, adult: Secondary | ICD-10-CM | POA: Diagnosis not present

## 2023-09-10 DIAGNOSIS — Z1331 Encounter for screening for depression: Secondary | ICD-10-CM | POA: Diagnosis not present

## 2023-09-14 DIAGNOSIS — M0609 Rheumatoid arthritis without rheumatoid factor, multiple sites: Secondary | ICD-10-CM | POA: Diagnosis not present

## 2023-09-18 ENCOUNTER — Other Ambulatory Visit: Payer: Self-pay | Admitting: Family Medicine

## 2023-09-18 ENCOUNTER — Other Ambulatory Visit: Payer: Self-pay

## 2023-09-18 MED ORDER — MOMETASONE FUROATE 50 MCG/ACT NA SUSP: NASAL | 1 refills | Status: DC

## 2023-09-24 DIAGNOSIS — M542 Cervicalgia: Secondary | ICD-10-CM | POA: Diagnosis not present

## 2023-10-01 ENCOUNTER — Other Ambulatory Visit: Payer: Self-pay

## 2023-10-01 DIAGNOSIS — M5416 Radiculopathy, lumbar region: Secondary | ICD-10-CM | POA: Diagnosis not present

## 2023-10-01 DIAGNOSIS — G894 Chronic pain syndrome: Secondary | ICD-10-CM | POA: Diagnosis not present

## 2023-10-01 DIAGNOSIS — K8050 Calculus of bile duct without cholangitis or cholecystitis without obstruction: Secondary | ICD-10-CM | POA: Insufficient documentation

## 2023-10-01 DIAGNOSIS — K8020 Calculus of gallbladder without cholecystitis without obstruction: Secondary | ICD-10-CM | POA: Insufficient documentation

## 2023-10-01 DIAGNOSIS — M4800 Spinal stenosis, site unspecified: Secondary | ICD-10-CM | POA: Insufficient documentation

## 2023-10-01 DIAGNOSIS — M546 Pain in thoracic spine: Secondary | ICD-10-CM | POA: Diagnosis not present

## 2023-10-01 DIAGNOSIS — Z9889 Other specified postprocedural states: Secondary | ICD-10-CM | POA: Insufficient documentation

## 2023-10-01 DIAGNOSIS — R112 Nausea with vomiting, unspecified: Secondary | ICD-10-CM | POA: Insufficient documentation

## 2023-10-01 DIAGNOSIS — J45909 Unspecified asthma, uncomplicated: Secondary | ICD-10-CM | POA: Insufficient documentation

## 2023-10-01 DIAGNOSIS — R52 Pain, unspecified: Secondary | ICD-10-CM | POA: Insufficient documentation

## 2023-10-01 DIAGNOSIS — M5412 Radiculopathy, cervical region: Secondary | ICD-10-CM | POA: Diagnosis not present

## 2023-10-01 DIAGNOSIS — M542 Cervicalgia: Secondary | ICD-10-CM | POA: Diagnosis not present

## 2023-10-01 DIAGNOSIS — M069 Rheumatoid arthritis, unspecified: Secondary | ICD-10-CM | POA: Diagnosis not present

## 2023-10-02 ENCOUNTER — Ambulatory Visit: Payer: 59

## 2023-10-02 DIAGNOSIS — M47812 Spondylosis without myelopathy or radiculopathy, cervical region: Secondary | ICD-10-CM | POA: Diagnosis not present

## 2023-10-02 DIAGNOSIS — M5412 Radiculopathy, cervical region: Secondary | ICD-10-CM | POA: Diagnosis not present

## 2023-10-03 DIAGNOSIS — N83202 Unspecified ovarian cyst, left side: Secondary | ICD-10-CM | POA: Diagnosis not present

## 2023-10-03 NOTE — Progress Notes (Signed)
 Canceled appointment.

## 2023-10-04 DIAGNOSIS — M2550 Pain in unspecified joint: Secondary | ICD-10-CM | POA: Diagnosis not present

## 2023-10-04 DIAGNOSIS — R5382 Chronic fatigue, unspecified: Secondary | ICD-10-CM | POA: Diagnosis not present

## 2023-10-04 DIAGNOSIS — M0609 Rheumatoid arthritis without rheumatoid factor, multiple sites: Secondary | ICD-10-CM | POA: Diagnosis not present

## 2023-10-04 DIAGNOSIS — N83202 Unspecified ovarian cyst, left side: Secondary | ICD-10-CM | POA: Diagnosis not present

## 2023-10-04 DIAGNOSIS — Z7689 Persons encountering health services in other specified circumstances: Secondary | ICD-10-CM | POA: Diagnosis not present

## 2023-10-04 DIAGNOSIS — Z9189 Other specified personal risk factors, not elsewhere classified: Secondary | ICD-10-CM | POA: Diagnosis not present

## 2023-10-04 DIAGNOSIS — G8929 Other chronic pain: Secondary | ICD-10-CM | POA: Diagnosis not present

## 2023-10-04 DIAGNOSIS — Z79899 Other long term (current) drug therapy: Secondary | ICD-10-CM | POA: Diagnosis not present

## 2023-10-10 DIAGNOSIS — M05741 Rheumatoid arthritis with rheumatoid factor of right hand without organ or systems involvement: Secondary | ICD-10-CM | POA: Diagnosis not present

## 2023-10-10 DIAGNOSIS — R112 Nausea with vomiting, unspecified: Secondary | ICD-10-CM | POA: Diagnosis not present

## 2023-10-10 DIAGNOSIS — M5412 Radiculopathy, cervical region: Secondary | ICD-10-CM | POA: Diagnosis not present

## 2023-10-10 DIAGNOSIS — M069 Rheumatoid arthritis, unspecified: Secondary | ICD-10-CM | POA: Diagnosis not present

## 2023-10-10 DIAGNOSIS — R600 Localized edema: Secondary | ICD-10-CM | POA: Diagnosis not present

## 2023-10-10 DIAGNOSIS — M79642 Pain in left hand: Secondary | ICD-10-CM | POA: Diagnosis not present

## 2023-10-10 DIAGNOSIS — I872 Venous insufficiency (chronic) (peripheral): Secondary | ICD-10-CM | POA: Diagnosis not present

## 2023-10-10 DIAGNOSIS — Z79899 Other long term (current) drug therapy: Secondary | ICD-10-CM | POA: Diagnosis not present

## 2023-10-10 DIAGNOSIS — M05742 Rheumatoid arthritis with rheumatoid factor of left hand without organ or systems involvement: Secondary | ICD-10-CM | POA: Diagnosis not present

## 2023-10-10 DIAGNOSIS — M79641 Pain in right hand: Secondary | ICD-10-CM | POA: Diagnosis not present

## 2023-10-10 DIAGNOSIS — R1114 Bilious vomiting: Secondary | ICD-10-CM | POA: Diagnosis not present

## 2023-10-10 DIAGNOSIS — R10811 Right upper quadrant abdominal tenderness: Secondary | ICD-10-CM | POA: Diagnosis not present

## 2023-10-11 DIAGNOSIS — R10811 Right upper quadrant abdominal tenderness: Secondary | ICD-10-CM | POA: Diagnosis not present

## 2023-10-11 DIAGNOSIS — G8929 Other chronic pain: Secondary | ICD-10-CM | POA: Diagnosis not present

## 2023-10-11 DIAGNOSIS — M069 Rheumatoid arthritis, unspecified: Secondary | ICD-10-CM | POA: Diagnosis not present

## 2023-10-11 DIAGNOSIS — M79641 Pain in right hand: Secondary | ICD-10-CM | POA: Diagnosis not present

## 2023-10-11 DIAGNOSIS — M79642 Pain in left hand: Secondary | ICD-10-CM | POA: Diagnosis not present

## 2023-10-11 DIAGNOSIS — Z79899 Other long term (current) drug therapy: Secondary | ICD-10-CM | POA: Diagnosis not present

## 2023-10-11 DIAGNOSIS — R1114 Bilious vomiting: Secondary | ICD-10-CM | POA: Diagnosis not present

## 2023-10-12 DIAGNOSIS — N838 Other noninflammatory disorders of ovary, fallopian tube and broad ligament: Secondary | ICD-10-CM | POA: Diagnosis not present

## 2023-10-12 DIAGNOSIS — R1032 Left lower quadrant pain: Secondary | ICD-10-CM | POA: Diagnosis not present

## 2023-10-12 DIAGNOSIS — R1114 Bilious vomiting: Secondary | ICD-10-CM | POA: Diagnosis not present

## 2023-10-12 DIAGNOSIS — R1011 Right upper quadrant pain: Secondary | ICD-10-CM | POA: Diagnosis not present

## 2023-10-12 DIAGNOSIS — N83201 Unspecified ovarian cyst, right side: Secondary | ICD-10-CM | POA: Diagnosis not present

## 2023-10-12 DIAGNOSIS — R10811 Right upper quadrant abdominal tenderness: Secondary | ICD-10-CM | POA: Diagnosis not present

## 2023-10-12 DIAGNOSIS — N83202 Unspecified ovarian cyst, left side: Secondary | ICD-10-CM | POA: Diagnosis not present

## 2023-10-12 DIAGNOSIS — R110 Nausea: Secondary | ICD-10-CM | POA: Diagnosis not present

## 2023-10-12 DIAGNOSIS — K3184 Gastroparesis: Secondary | ICD-10-CM | POA: Diagnosis not present

## 2023-10-12 DIAGNOSIS — R112 Nausea with vomiting, unspecified: Secondary | ICD-10-CM | POA: Diagnosis not present

## 2023-10-17 DIAGNOSIS — D1803 Hemangioma of intra-abdominal structures: Secondary | ICD-10-CM | POA: Diagnosis not present

## 2023-10-17 DIAGNOSIS — R319 Hematuria, unspecified: Secondary | ICD-10-CM | POA: Diagnosis not present

## 2023-10-17 DIAGNOSIS — K219 Gastro-esophageal reflux disease without esophagitis: Secondary | ICD-10-CM | POA: Diagnosis not present

## 2023-10-17 DIAGNOSIS — R748 Abnormal levels of other serum enzymes: Secondary | ICD-10-CM | POA: Diagnosis not present

## 2023-10-17 DIAGNOSIS — R399 Unspecified symptoms and signs involving the genitourinary system: Secondary | ICD-10-CM | POA: Diagnosis not present

## 2023-10-18 DIAGNOSIS — M0609 Rheumatoid arthritis without rheumatoid factor, multiple sites: Secondary | ICD-10-CM | POA: Diagnosis not present

## 2023-10-19 DIAGNOSIS — M05741 Rheumatoid arthritis with rheumatoid factor of right hand without organ or systems involvement: Secondary | ICD-10-CM | POA: Diagnosis not present

## 2023-10-19 DIAGNOSIS — R1110 Vomiting, unspecified: Secondary | ICD-10-CM | POA: Diagnosis not present

## 2023-10-19 DIAGNOSIS — M05742 Rheumatoid arthritis with rheumatoid factor of left hand without organ or systems involvement: Secondary | ICD-10-CM | POA: Diagnosis not present

## 2023-10-23 DIAGNOSIS — R399 Unspecified symptoms and signs involving the genitourinary system: Secondary | ICD-10-CM | POA: Diagnosis not present

## 2023-10-24 DIAGNOSIS — Z9049 Acquired absence of other specified parts of digestive tract: Secondary | ICD-10-CM | POA: Diagnosis not present

## 2023-10-24 DIAGNOSIS — D1803 Hemangioma of intra-abdominal structures: Secondary | ICD-10-CM | POA: Diagnosis not present

## 2023-10-24 DIAGNOSIS — K769 Liver disease, unspecified: Secondary | ICD-10-CM | POA: Diagnosis not present

## 2023-10-25 ENCOUNTER — Telehealth: Payer: Self-pay | Admitting: *Deleted

## 2023-10-25 NOTE — Telephone Encounter (Signed)
 Spoke with the patient regarding the referral to GYN oncology. Patient scheduled as new patient with Dr Alvester Morin on 4/28. Patient given an arrival time of 10:00am  Explained to the patient the the doctor will perform a pelvic exam at this visit. Patient given the policy that only one visitor allowed and that visitor must be over 16 yrs are allowed in the Cancer Center. Patient given the address/phone number for the clinic and that the center offers free valet service. Patient aware that masks required.

## 2023-11-07 DIAGNOSIS — Z79899 Other long term (current) drug therapy: Secondary | ICD-10-CM | POA: Diagnosis not present

## 2023-11-07 DIAGNOSIS — Z79620 Long term (current) use of immunosuppressive biologic: Secondary | ICD-10-CM | POA: Diagnosis not present

## 2023-11-07 DIAGNOSIS — Z79631 Long term (current) use of antimetabolite agent: Secondary | ICD-10-CM | POA: Diagnosis not present

## 2023-11-07 DIAGNOSIS — M0609 Rheumatoid arthritis without rheumatoid factor, multiple sites: Secondary | ICD-10-CM | POA: Diagnosis not present

## 2023-11-07 DIAGNOSIS — R768 Other specified abnormal immunological findings in serum: Secondary | ICD-10-CM | POA: Diagnosis not present

## 2023-11-13 DIAGNOSIS — G8929 Other chronic pain: Secondary | ICD-10-CM | POA: Diagnosis not present

## 2023-11-13 DIAGNOSIS — F4320 Adjustment disorder, unspecified: Secondary | ICD-10-CM | POA: Diagnosis not present

## 2023-11-13 DIAGNOSIS — M069 Rheumatoid arthritis, unspecified: Secondary | ICD-10-CM | POA: Diagnosis not present

## 2023-11-15 ENCOUNTER — Encounter: Payer: Self-pay | Admitting: Psychiatry

## 2023-11-15 DIAGNOSIS — M0609 Rheumatoid arthritis without rheumatoid factor, multiple sites: Secondary | ICD-10-CM | POA: Diagnosis not present

## 2023-11-18 ENCOUNTER — Other Ambulatory Visit: Payer: Self-pay | Admitting: Family Medicine

## 2023-11-18 DIAGNOSIS — K219 Gastro-esophageal reflux disease without esophagitis: Secondary | ICD-10-CM

## 2023-11-19 ENCOUNTER — Encounter: Payer: Self-pay | Admitting: Psychiatry

## 2023-11-19 ENCOUNTER — Inpatient Hospital Stay: Attending: Psychiatry | Admitting: Psychiatry

## 2023-11-19 VITALS — BP 114/81 | HR 86 | Temp 98.2°F | Resp 16 | Ht 63.0 in | Wt 184.6 lb

## 2023-11-19 DIAGNOSIS — Z791 Long term (current) use of non-steroidal anti-inflammatories (NSAID): Secondary | ICD-10-CM | POA: Diagnosis not present

## 2023-11-19 DIAGNOSIS — Z8049 Family history of malignant neoplasm of other genital organs: Secondary | ICD-10-CM | POA: Insufficient documentation

## 2023-11-19 DIAGNOSIS — Z793 Long term (current) use of hormonal contraceptives: Secondary | ICD-10-CM | POA: Insufficient documentation

## 2023-11-19 DIAGNOSIS — Z79631 Long term (current) use of antimetabolite agent: Secondary | ICD-10-CM | POA: Insufficient documentation

## 2023-11-19 DIAGNOSIS — Z79899 Other long term (current) drug therapy: Secondary | ICD-10-CM | POA: Insufficient documentation

## 2023-11-19 DIAGNOSIS — Z7951 Long term (current) use of inhaled steroids: Secondary | ICD-10-CM | POA: Diagnosis not present

## 2023-11-19 DIAGNOSIS — K219 Gastro-esophageal reflux disease without esophagitis: Secondary | ICD-10-CM | POA: Insufficient documentation

## 2023-11-19 DIAGNOSIS — E785 Hyperlipidemia, unspecified: Secondary | ICD-10-CM | POA: Insufficient documentation

## 2023-11-19 DIAGNOSIS — J45909 Unspecified asthma, uncomplicated: Secondary | ICD-10-CM | POA: Insufficient documentation

## 2023-11-19 DIAGNOSIS — Z8042 Family history of malignant neoplasm of prostate: Secondary | ICD-10-CM | POA: Insufficient documentation

## 2023-11-19 DIAGNOSIS — I10 Essential (primary) hypertension: Secondary | ICD-10-CM | POA: Insufficient documentation

## 2023-11-19 DIAGNOSIS — F419 Anxiety disorder, unspecified: Secondary | ICD-10-CM | POA: Diagnosis not present

## 2023-11-19 DIAGNOSIS — R350 Frequency of micturition: Secondary | ICD-10-CM | POA: Insufficient documentation

## 2023-11-19 DIAGNOSIS — R32 Unspecified urinary incontinence: Secondary | ICD-10-CM | POA: Diagnosis not present

## 2023-11-19 DIAGNOSIS — R351 Nocturia: Secondary | ICD-10-CM | POA: Diagnosis not present

## 2023-11-19 DIAGNOSIS — M069 Rheumatoid arthritis, unspecified: Secondary | ICD-10-CM | POA: Diagnosis not present

## 2023-11-19 DIAGNOSIS — Z803 Family history of malignant neoplasm of breast: Secondary | ICD-10-CM | POA: Diagnosis not present

## 2023-11-19 DIAGNOSIS — F339 Major depressive disorder, recurrent, unspecified: Secondary | ICD-10-CM | POA: Insufficient documentation

## 2023-11-19 DIAGNOSIS — Z79891 Long term (current) use of opiate analgesic: Secondary | ICD-10-CM | POA: Insufficient documentation

## 2023-11-19 DIAGNOSIS — Z800 Family history of malignant neoplasm of digestive organs: Secondary | ICD-10-CM | POA: Insufficient documentation

## 2023-11-19 DIAGNOSIS — N83202 Unspecified ovarian cyst, left side: Secondary | ICD-10-CM | POA: Insufficient documentation

## 2023-11-19 DIAGNOSIS — Z6832 Body mass index (BMI) 32.0-32.9, adult: Secondary | ICD-10-CM | POA: Diagnosis not present

## 2023-11-19 DIAGNOSIS — M4722 Other spondylosis with radiculopathy, cervical region: Secondary | ICD-10-CM | POA: Diagnosis not present

## 2023-11-19 DIAGNOSIS — Z808 Family history of malignant neoplasm of other organs or systems: Secondary | ICD-10-CM | POA: Insufficient documentation

## 2023-11-19 NOTE — Progress Notes (Unsigned)
 GYNECOLOGIC ONCOLOGY NEW PATIENT CONSULTATION  Date of Service: 11/19/2023 Referring Provider: Ona Bidding, MD   ASSESSMENT AND PLAN: Anne Jefferson is a 48 y.o. woman with 5cm simple left ovarian cyst, normal CA125.  Reviewed her workup today.  Reviewed that her workup is overall reassuring and most consistent with a benign cyst. Discussed that observation is reasonable given simple appearance, normal CA125, and generally asymptomatic.  Based on records from referring clinic, it appears that her gynecologist was planning for 64-month repeat ultrasound.  This is reasonable.  If the cyst continues to appear benign and stable, likely reasonable to discontinue active surveillance at that time.  If change in symptoms, exam could repeat ultrasound sooner.  If became symptomatic would be reasonable to perform cystectomy versus oophorectomy given size and patient's age.  Would likely favor an oophorectomy given patient's age.  We did review risk of torsion.  At her age, low concern for loss of ovary from a hormone perspective but could lead to acute pain.  Patient also with note of increased urinary frequency, nocturia, and urinary incontinence today.  Offered referral to urogynecology but patient would like to hold off at this time.   A copy of this note was sent to the patient's referring provider.  Derrel Flies, MD Gynecologic Oncology   Medical Decision Making I personally spent  TOTAL 60 minutes face-to-face and non-face-to-face in the care of this patient, which includes all pre, intra, and post visit time on the date of service.   ------------  CC: Ovarian cyst  HISTORY OF PRESENT ILLNESS:  Anne Jefferson is a 48 y.o. woman who is seen in consultation at the request of  Ona Bidding, MD for evaluation of ovarian cyst.  Patient was seen by OB/GYN 05/28/2023 for second opinion for consideration of hysterectomy.  In July she had noted bright red bleeding while  previously amenorrheic with Mirena  and underwent an ultrasound which showed an incidental finding of a left ovarian simple cyst measuring 4.4 cm.  Repeat ultrasound in August showed a right ovarian follicle 2.6 cm left ovarian simple cysts 5 and 3.1 cm.  She reported continuous bleeding and sharp pain in both ovaries.  An ultrasound at that time showed a 4.5 cm simple cyst of the left ovary.  She was recommended observation at that time with follow-up ultrasound in 4 months.  She return for ultrasound in 10/03/2023 which showed a 5.18 x 2.75 x 3.57 cm left ovarian cyst and a 2.44 x 1.83 x 1.44 cm right ovarian cyst.  Both cysts were simple in appearance.   Today patient endorses the history as above.  She first sought out evaluation for her irregular bleeding on the Mirena  IUD following period of amenorrhea on the IUD.  She reports that the Mirena  was placed at age 65 to stop her periods given that she had been experiencing so much arthritic pain she did not want to have to deal with periods on top of that.  She reports that the bleeding was often light or just when she wiped.  This occurred for 6 to 8 months.  Sometimes bright red, sometimes old in appearance.  Now she only has bleeding on occasion.  She also notes occasional "twinge" of pain in the right lower quadrant that is not persistent and not all the time.  Additionally she reports urinary frequency particularly at night which is slowly increased over the last 9 to 10 months.  She also occasionally has urinary incontinence.  She goes to the  bathroom about 5-6 times a night.  She has had several recent urine cultures which have been negative for infection.  In terms of her rheumatoid arthritis.  She follows with [facility] in [place], [facility] .  She has been off of prednisone  for about 9 months.  She is currently on Golimumab  infusions, plaquenil  and methotrexate .  Patient also follows with providers at the [facility] clinic.  She reports  that she follows with rheumatology, neurology, internal medicine, and vascular.  The internal medicine provider there access her primary care provider and overall she feels that they are able to oversee all of her medical problems there.    PAST MEDICAL HISTORY: Past Medical History:  Diagnosis Date   Abnormal Pap smear    Abnormal TSH 02/03/2022   Acute cough 10/20/2021   Acute non-recurrent frontal sinusitis 09/17/2018   Adult stuttering 02/01/2022   Alternating constipation and diarrhea 10/03/2022   Anxiety    no meds   Asthma    mild, no issue for years no  inhaler use except in winter   Bilateral lower extremity edema 04/20/2023   Biliary colic    Blood type, Rh negative    BUNIONS, LEFT FOOT 12/18/2007   Qualifier: Diagnosis of   By: Nolene Baumgarten MD, KARL         Cervical spondylosis with radiculopathy 02/02/2022   Chronic generalized pain disorder 07/29/2020   Edema 08/08/2023   Essential (primary) hypertension 10/03/2022   Gallstones    Gastroesophageal reflux disease without esophagitis 10/03/2022   H/O dysmenorrhea    History of nasal polyposis 11/17/2021   Hyperlipidemia 10/03/2022   Infertility, female    Lightheadedness 02/03/2022   Major depressive disorder, recurrent episode, moderate (HCC) 01/09/2023   METATARSALGIA 12/18/2007   Qualifier: Diagnosis of   By: Nolene Baumgarten MD, KARL      Replacing diagnoses that were inactivated after the 10/23/22 regulatory import     Moderate persistent asthma with (acute) exacerbation 11/16/2020   Moderate persistent asthma without complication 12/23/2020   Nausea and vomiting    Nausea without vomiting 04/20/2023   Pain    back and chest realted to gallbladder since november 2018   Perennial and seasonal allergic rhinitis 09/17/2018   PONV (postoperative nausea and vomiting)    severe, needs heavy anti nausea meds   Rheumatoid arthritis (HCC) 02/02/2022   Severe obesity (BMI 35.0-39.9) with comorbidity (HCC) 02/03/2022   SOB  (shortness of breath) 04/20/2023   Spinal stenosis    UNEQUAL LEG LENGTH 12/18/2007   Qualifier: Diagnosis of   By: Nolene Baumgarten MD, KARL         Venous insufficiency of both lower extremities 03/12/2023    PAST SURGICAL HISTORY: Past Surgical History:  Procedure Laterality Date    c sections     x 2  3 births last was twins   BUNIONECTOMY Right    CHOLECYSTECTOMY N/A 11/22/2017   Procedure: LAPAROSCOPIC CHOLECYSTECTOMY ERAS PATHWAY;  Surgeon: Derral Flick, MD;  Location: [facility];  Service: General;  Laterality: N/A;   LEEP  12/23/2009   STRABISMUS SURGERY Left 10/2022   WISDOM TOOTH EXTRACTION      OB/GYN HISTORY: OB History  Gravida Para Term Preterm AB Living  2 2 2   3   SAB IAB Ectopic Multiple Live Births     1 3    # Outcome Date GA Lbr Len/2nd Weight Sex Type Anes PTL Lv  2A Term 2014     CS-LTranv   LIV  2B  Term      CS-LTranv   LIV  1 Term 2006    M CS-LTranv   LIV      Age at menarche: 34 Age at menopause: 31 due to IUD placement Hx of HRT: IUD currently Hx of STI: no Last pap: 10/14/20 NILM, HPVHR neg History of abnormal pap smears: LEEP 2011, thinks normal since that time but not positive  SCREENING STUDIES:  Last mammogram: 2024 Last colonoscopy: 2018  MEDICATIONS:  Current Outpatient Medications:    acetaminophen  (TYLENOL ) 500 MG tablet, Take 500 mg by mouth every 6 (six) hours as needed., Disp: , Rfl:    albuterol  (PROVENTIL  HFA) 108 (90 Base) MCG/ACT inhaler, Inhale 2 puffs into the lungs every 4 (four) hours as needed for wheezing or shortness of breath., Disp: 54 g, Rfl: 1   albuterol  (PROVENTIL ) (2.5 MG/3ML) 0.083% nebulizer solution, Use 1 unit dose via the nebulizer every 6 hours as needed for cough, wheeze, tightness in chest, or shortness of breath., Disp: 75 mL, Rfl: 3   Albuterol -Budesonide  (AIRSUPRA ) 90-80 MCG/ACT AERO, Inhale 2 puffs into the lungs every 4 (four) hours as needed (cough, wheeze, shortness of breath)., Disp: 32.1 g, Rfl: 1    AUVI-Q  0.3 MG/0.3ML SOAJ injection, Inject 0.3 mg into the muscle as needed for anaphylaxis., Disp: 1 each, Rfl: 1   BREZTRI  AEROSPHERE 160-9-4.8 MCG/ACT AERO, Inhale 2 puffs into the lungs in the morning and at bedtime., Disp: 32.1 g, Rfl: 1   celecoxib  (CELEBREX ) 100 MG capsule, Take 100 mg by mouth 2 (two) times daily., Disp: , Rfl:    docusate sodium  (COLACE) 100 MG capsule, Take 1 capsule (100 mg total) by mouth every 12 (twelve) hours., Disp: 60 capsule, Rfl: 0   DULoxetine  (CYMBALTA ) 60 MG capsule, Take 1 capsule (60 mg total) by mouth daily., Disp: 90 capsule, Rfl: 2   EPINEPHrine  (AUVI-Q ) 0.3 mg/0.3 mL IJ SOAJ injection, Inject 0.3 mg into the muscle as needed for anaphylaxis., Disp: 2 each, Rfl: 1   fexofenadine  (ALLEGRA ) 180 MG tablet, Take 1 tablet (180 mg total) by mouth 2 (two) times daily., Disp: 180 tablet, Rfl: 1   folic acid  (FOLVITE ) 1 MG tablet, Take 1 mg by mouth daily. Everyday but Thursdays, Disp: , Rfl:    golimumab  (SIMPONI  ARIA) 50 MG/4ML SOLN injection, Inject 50 mg into the vein every 8 (eight) weeks., Disp: , Rfl:    HYDROcodone -acetaminophen  (NORCO) 10-325 MG tablet, Take 1 tablet by mouth 4 (four) times daily., Disp: , Rfl:    hydroxychloroquine  (PLAQUENIL ) 200 MG tablet, Take 200 mg by mouth 2 (two) times daily., Disp: , Rfl:    levonorgestrel  (MIRENA , 52 MG,) 20 MCG/DAY IUD, 1 each by Intrauterine route., Disp: , Rfl:    methotrexate  (RHEUMATREX) 2.5 MG tablet, Take 8 tablets by mouth once a week. Caution:Chemotherapy. Protect from light. Takes on Thursdays, Disp: , Rfl:    mometasone  (NASONEX ) 50 MCG/ACT nasal spray, 1 spray each nostril twice daily for nasal congestion control., Disp: 51 g, Rfl: 1   ondansetron  (ZOFRAN -ODT) 4 MG disintegrating tablet, Dissolve 1 tablet (4 mg total) by mouth daily as needed for nausea or vomiting., Disp: 30 tablet, Rfl: 0   pregabalin (LYRICA) 50 MG capsule, Take 50 mg by mouth 3 (three) times daily as needed (pain)., Disp: , Rfl:     tirzepatide (ZEPBOUND) 2.5 MG/0.5ML Pen, Inject 2.5 mg into the skin., Disp: , Rfl:    VITAMIN D PO, Take 1 tablet by mouth daily., Disp: ,  Rfl:    budesonide  (PULMICORT ) 0.5 MG/2ML nebulizer solution, TAKE 2 MLS (0.5 MG TOTAL) BY NEBULIZATION 3 (THREE) TIMES DAILY AS NEEDED (ASTHMA FLARE). (Patient not taking: Reported on 11/15/2023), Disp: 540 mL, Rfl: 1   melatonin (MELATONIN MAXIMUM STRENGTH) 5 MG TABS, Take 5 mg by mouth., Disp: , Rfl:    pantoprazole  (PROTONIX ) 40 MG tablet, TAKE 1 TABLET BY MOUTH EVERY DAY, Disp: 90 tablet, Rfl: 2  ALLERGIES: Allergies  Allergen Reactions   Cat Dander     Other Reaction(s): Other   Promethazine Other (See Comments)    Hallucinations   Theophyllines Nausea And Vomiting   Mite (D. Farinae) Other (See Comments)    Cats, dogs, dust    Molds & Smuts Other (See Comments)    FAMILY HISTORY: Family History  Problem Relation Age of Onset   Allergic rhinitis Mother    Osteoarthritis Mother    Prostate cancer Father    Healthy Brother    Healthy Brother    Breast cancer Maternal Aunt    Diabetes Maternal Grandmother    Skin cancer Maternal Grandmother    Celiac disease Son    Autoimmune disease Son    Anemia Son    Diabetes Son        borderline   Pancreatic cancer Neg Hx    Ovarian cancer Neg Hx    Endometrial cancer Neg Hx    Colon cancer Neg Hx     SOCIAL HISTORY: Social History   Socioeconomic History   Marital status: Divorced    Spouse name: Not on file   Number of children: 1   Years of education: Not on file   Highest education level: Master's degree (e.g., MA, MS, MEng, MEd, MSW, MBA)  Occupational History    Comment: Doctor, 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  Tobacco Use   Smoking status: Never   Smokeless tobacco: Never  Vaping Use   Vaping status: Never Used  Substance and Sexual Activity   Alcohol use: Never   Drug use: Not Currently    Types: Marijuana    Comment: occ gummy   Sexual activity: Yes    Birth  control/protection: None  Other Topics Concern   Not on file  Social History Narrative   Not on file   Social Drivers of Health   Financial Resource Strain: Low Risk  (01/29/2023)   Received from [facility], [facility]   Overall Financial Resource Strain (CARDIA)    Difficulty of Paying Living Expenses: Not very hard  Food Insecurity: Low Risk  (10/16/2023)   Received from [facility]   Hunger Vital Sign    Worried About Running Out of Food in the Last Year: Never true    Ran Out of Food in the Last Year: Never true  Transportation Needs: No Transportation Needs (10/16/2023)   Received from [facility]    In the past 12 months, has lack of reliable transportation kept you from medical appointments, meetings, work or from getting things needed for daily living? : No  Physical Activity: Insufficiently Active (01/29/2023)   Received from [facility], [facility]   Exercise Vital Sign    Days of Exercise per Week: 1 day    Minutes of Exercise per Session: 10 min  Stress: No Stress Concern Present (01/29/2023)   Received from [facility], [facility]   [employer] of Occupational Health - Occupational Stress Questionnaire    Feeling of Stress : Not at all  Social Connections:  Socially Integrated (01/29/2023)   Received from [facility], [facility]   Social Network    How would you rate your social network (family, work, friends)?: Good participation with social networks  Intimate Partner Violence: Not At Risk (01/29/2023)   Received from [facility], [facility]   HITS    Over the last 12 months how often did your partner physically hurt you?: Never    Over the last 12 months how often did your partner insult you or talk down to you?: Never    Over the last 12 months how often did your partner threaten you with physical harm?: Never    Over the last 12 months how often did your partner scream or curse at you?: Never    REVIEW OF  SYSTEMS: New patient intake form was reviewed.  Complete 10-system review is negative except for the following: Urinary frequency, pelvic pain, fatigue, incontinence, ringing in ears  PHYSICAL EXAM: BP 114/81 (BP Location: Left Arm, Patient Position: Sitting)   Pulse 86   Temp 98.2 F (36.8 C) (Oral)   Resp 16   Ht 5\' 3"  (1.6 m)   Wt 184 lb 9.6 oz (83.7 kg)   SpO2 97%   BMI 32.70 kg/m  Constitutional: No acute distress. Neuro/Psych: Alert, oriented.  Head and Neck: Normocephalic, atraumatic. Neck symmetric without masses. Sclera anicteric.  Respiratory: Normal work of breathing. Clear to auscultation bilaterally. Cardiovascular: Regular rate and rhythm, no murmurs, rubs, or gallops. Abdomen: Normoactive bowel sounds. Soft, non-distended, non-tender to palpation. No masses appreciated. Extremities: Grossly normal range of motion. Warm, well perfused. No edema bilaterally. Skin: No rashes or lesions. Lymphatic: No cervical, supraclavicular, or inguinal adenopathy. Genitourinary: External genitalia without lesions. Urethral meatus without lesions or prolapse. On speculum exam, vagina and cervix without lesions. Bimanual exam reveals normal mobile uterus. Cystic left adnexal mass which is mobile. No firmness or nodularity. Exam chaperoned by Vira Grieves, NP   LABORATORY AND RADIOLOGIC DATA: Outside medical records were reviewed to synthesize the above history, along with the history and physical obtained during the visit.  Outside laboratory, pathology, and imaging reports were reviewed, with pertinent results below.  I personally reviewed the outside images.  WBC  Date Value Ref Range Status  04/20/2023 6.9 4.0 - 10.5 K/uL Final   Hemoglobin  Date Value Ref Range Status  04/20/2023 12.3 12.0 - 15.0 g/dL Final  16/04/9603 54.0 11.1 - 15.9 g/dL Final   HCT  Date Value Ref Range Status  04/20/2023 38.2 36.0 - 46.0 % Final   Hematocrit  Date Value Ref Range Status  08/31/2020  40.3 34.0 - 46.6 % Final   Platelets  Date Value Ref Range Status  04/20/2023 249.0 150.0 - 400.0 K/uL Final  08/31/2020 259 150 - 450 x10E3/uL Final   Creatinine, Ser  Date Value Ref Range Status  08/09/2023 0.84 0.40 - 1.20 mg/dL Final   AST  Date Value Ref Range Status  04/20/2023 18 0 - 37 U/L Final   ALT  Date Value Ref Range Status  04/20/2023 19 0 - 35 U/L Final   Labs (10/04/23): CEA 0.9 CA125 7.7  Pelvic ultrasound (10/03/23): Uterus within normal limits Endometrium within normal limits, IUD appears in proper location Cervix nabothian cysts Right ovary: Simple cystic lesion, avascular, 2.44 x 1.83 x 1.44 cm Left ovary: Simple cystic lesion, avascular, 5.18 x 2.75 x 3.57 cm Cul-de-sac within normal limits  CT Abdomen Pelvis W Contrast 06/01/2022  Narrative CLINICAL DATA:  Abdominal and  low back pain  EXAM: CT ABDOMEN AND PELVIS WITH CONTRAST  TECHNIQUE: Multidetector CT imaging of the abdomen and pelvis was performed using the standard protocol following bolus administration of intravenous contrast.  RADIATION DOSE REDUCTION: This exam was performed according to the departmental dose-optimization program which includes automated exposure control, adjustment of the mA and/or kV according to patient size and/or use of iterative reconstruction technique.  CONTRAST:  75mL OMNIPAQUE  IOHEXOL  350 MG/ML SOLN  COMPARISON:  CT scan 05/26/2022  FINDINGS: Lower chest: Unremarkable  Hepatobiliary: 3.1 by 3.0 cm hypodense lesion in the left hepatic lobe on image 15 series 3, formerly 1.3 by 1.4 cm on 03/14/2018, formerly hyperechoic on ultrasound, with some marginal nodular discontinuous enhancement, appearance favors hepatic hemangioma.  0.5 cm hypodense lesion in the left hepatic lobe on image 10 series 3, stable from 03/14/2018, technically nonspecific although statistically likely to be benign given the long-term stability.  Peripheral nodular enhancement  in a 3.9 by 2.5 cm lesion posteriorly in the right hepatic lobe on image 22 of series 3 corresponding to a previous hyperechoic lesion. This previously measured 4.1 by 3.1 cm on 03/14/2018. Again, very likely to be a hepatic hemangioma.  A subtle hypodense 0.9 cm lesion in the right hepatic lobe on image 16 of series 3 is roughly stable from 03/14/2018, technically nonspecific although again this is probably a hemangioma and demonstrates long-term stability.  Prior cholecystectomy. Common bile duct 0.7 cm in diameter on image 25 series 3, mild prominence is likely a physiologic response to cholecystectomy.  Pancreas: Unremarkable  Spleen: Unremarkable  Adrenals/Urinary Tract: Both adrenal glands appear normal.  0.7 by 0.5 cm hypodense lesion in the right kidney lower pole on image 36 of series 3 is technically nonspecific due to small size although statistically likely to be a small cyst. No further imaging workup of this lesion is indicated.  No hydronephrosis or urinary tract calculi. Adrenal glands and urinary bladder unremarkable.  Stomach/Bowel: Frothy fluid throughout the borderline prominent colon and in the rectum, distal colonic air-levels indicating diarrheal process. Borderline dilated appendiceal tip but without wall thickening to suggest inflammation. There scattered air-fluid levels in nondilated loops of small bowel. No substantial small bowel or colonic wall thickening observed.  Vascular/Lymphatic: Minimal abdominal aortic atherosclerosis. No pathologic adenopathy.  Reproductive: IUD along the endometrium. 3.6 by 2.8 cm simple appearing left adnexal cyst, slightly enlarged from 05/26/2022. No follow-up imaging recommended. Note: This recommendation does not apply to premenarchal patients and to those with increased risk (genetic, family history, elevated tumor markers or other high-risk factors) of ovarian cancer. Reference: JACR 2020 Feb;  17(2):248-254  Other: No supplemental non-categorized findings.  Musculoskeletal: Small umbilical hernia contains adipose tissue.  IMPRESSION: 1. Frothy fluid throughout the borderline prominent colon and in the rectum, distal colonic air-levels indicating diarrheal process. Scattered air-fluid levels in nondilated loops of small bowel. 2. Several hepatic lesions are present, most of which are stable from 03/14/2018 (although one is enlarged) and most compatible with hepatic hemangiomas. These have demonstrate hyperechogenicity on prior ultrasounds, characteristic of hemangiomas. If the patient has abnormal liver enzymes or if other further workup is clinically indicated, hepatic protocol MRI with and without contrast could be utilized for definitive characterization. 3. Minimal abdominal aortic atherosclerosis. 4. Small umbilical hernia contains adipose tissue. 5. 3.6 by 2.8 cm simple appearing left adnexal cyst, slightly enlarged from 05/26/2022. No follow-up imaging recommended. 6. IUD along the endometrium.  Aortic Atherosclerosis (ICD10-I70.0).   Electronically Signed By: Freida Jes  M.D. On: 06/01/2022 14:36

## 2023-11-19 NOTE — Patient Instructions (Signed)
 It was a pleasure to see you in clinic today. - Reviewed that your work-up to date has been overall reassuring that this is likely a benign process - We discussed that observation is reasonable with your obgyn. Could consider removal of ovary/cyst.  Thank you very much for allowing me to provide care for you today.  I appreciate your confidence in choosing our Gynecologic Oncology team at [facility].  If you have any questions about your visit today please call our office or send us  a MyChart message and we will get back to you as soon as possible.

## 2023-11-20 ENCOUNTER — Encounter: Payer: Self-pay | Admitting: Psychiatry

## 2023-11-22 ENCOUNTER — Encounter: Payer: Self-pay | Admitting: Family

## 2023-12-05 DIAGNOSIS — L309 Dermatitis, unspecified: Secondary | ICD-10-CM | POA: Diagnosis not present

## 2023-12-05 DIAGNOSIS — D225 Melanocytic nevi of trunk: Secondary | ICD-10-CM | POA: Diagnosis not present

## 2023-12-05 DIAGNOSIS — D485 Neoplasm of uncertain behavior of skin: Secondary | ICD-10-CM | POA: Diagnosis not present

## 2023-12-05 DIAGNOSIS — L738 Other specified follicular disorders: Secondary | ICD-10-CM | POA: Diagnosis not present

## 2023-12-05 DIAGNOSIS — L821 Other seborrheic keratosis: Secondary | ICD-10-CM | POA: Diagnosis not present

## 2023-12-10 DIAGNOSIS — W57XXXA Bitten or stung by nonvenomous insect and other nonvenomous arthropods, initial encounter: Secondary | ICD-10-CM | POA: Diagnosis not present

## 2023-12-10 DIAGNOSIS — L259 Unspecified contact dermatitis, unspecified cause: Secondary | ICD-10-CM | POA: Diagnosis not present

## 2023-12-13 DIAGNOSIS — R21 Rash and other nonspecific skin eruption: Secondary | ICD-10-CM | POA: Diagnosis not present

## 2023-12-13 DIAGNOSIS — G894 Chronic pain syndrome: Secondary | ICD-10-CM | POA: Diagnosis not present

## 2023-12-13 DIAGNOSIS — F4323 Adjustment disorder with mixed anxiety and depressed mood: Secondary | ICD-10-CM | POA: Diagnosis not present

## 2023-12-13 DIAGNOSIS — F4542 Pain disorder with related psychological factors: Secondary | ICD-10-CM | POA: Diagnosis not present

## 2023-12-14 DIAGNOSIS — G894 Chronic pain syndrome: Secondary | ICD-10-CM | POA: Diagnosis not present

## 2023-12-14 DIAGNOSIS — F4323 Adjustment disorder with mixed anxiety and depressed mood: Secondary | ICD-10-CM | POA: Diagnosis not present

## 2023-12-14 DIAGNOSIS — M069 Rheumatoid arthritis, unspecified: Secondary | ICD-10-CM | POA: Diagnosis not present

## 2023-12-14 DIAGNOSIS — F4542 Pain disorder with related psychological factors: Secondary | ICD-10-CM | POA: Diagnosis not present

## 2023-12-18 DIAGNOSIS — Z7951 Long term (current) use of inhaled steroids: Secondary | ICD-10-CM | POA: Diagnosis not present

## 2023-12-18 DIAGNOSIS — R112 Nausea with vomiting, unspecified: Secondary | ICD-10-CM | POA: Diagnosis not present

## 2023-12-18 DIAGNOSIS — K3189 Other diseases of stomach and duodenum: Secondary | ICD-10-CM | POA: Diagnosis not present

## 2023-12-18 DIAGNOSIS — Z79899 Other long term (current) drug therapy: Secondary | ICD-10-CM | POA: Diagnosis not present

## 2023-12-21 ENCOUNTER — Telehealth: Payer: Self-pay

## 2023-12-21 NOTE — Telephone Encounter (Signed)
 Patient called and has had some health issues and she wasn't coming in to get her allergy injections. Patient is doing better and would like to start her allergy injections again. Patients last injection was 08/20/23. She last received 0.3 of her red vial at 0.5 every 4 weeks. Where would you like her to re-start at?

## 2023-12-24 NOTE — Telephone Encounter (Signed)
 Left a message informing patient of Dr. Gerold Kos recommendation. Due to having to mix down to her Green vials patient will need to start the Green vial over on the same schedule the patient is on now.

## 2023-12-25 DIAGNOSIS — M069 Rheumatoid arthritis, unspecified: Secondary | ICD-10-CM | POA: Diagnosis not present

## 2023-12-25 DIAGNOSIS — G894 Chronic pain syndrome: Secondary | ICD-10-CM | POA: Diagnosis not present

## 2023-12-25 NOTE — Telephone Encounter (Signed)
 Spoke with patient, informed her of Dr. Gerold Kos note/recommendation. Patient verbalized understanding and will come in tomorrow for her allergy injections.

## 2023-12-27 ENCOUNTER — Ambulatory Visit (INDEPENDENT_AMBULATORY_CARE_PROVIDER_SITE_OTHER): Payer: Self-pay

## 2023-12-27 DIAGNOSIS — J309 Allergic rhinitis, unspecified: Secondary | ICD-10-CM

## 2024-01-01 ENCOUNTER — Ambulatory Visit (INDEPENDENT_AMBULATORY_CARE_PROVIDER_SITE_OTHER): Payer: Self-pay

## 2024-01-01 DIAGNOSIS — J309 Allergic rhinitis, unspecified: Secondary | ICD-10-CM

## 2024-01-09 ENCOUNTER — Ambulatory Visit (INDEPENDENT_AMBULATORY_CARE_PROVIDER_SITE_OTHER): Payer: Self-pay

## 2024-01-09 DIAGNOSIS — J309 Allergic rhinitis, unspecified: Secondary | ICD-10-CM | POA: Diagnosis not present

## 2024-01-14 ENCOUNTER — Ambulatory Visit (INDEPENDENT_AMBULATORY_CARE_PROVIDER_SITE_OTHER): Payer: Self-pay

## 2024-01-14 DIAGNOSIS — J309 Allergic rhinitis, unspecified: Secondary | ICD-10-CM

## 2024-01-16 ENCOUNTER — Ambulatory Visit (INDEPENDENT_AMBULATORY_CARE_PROVIDER_SITE_OTHER): Payer: Self-pay

## 2024-01-16 DIAGNOSIS — J309 Allergic rhinitis, unspecified: Secondary | ICD-10-CM | POA: Diagnosis not present

## 2024-01-17 DIAGNOSIS — N83201 Unspecified ovarian cyst, right side: Secondary | ICD-10-CM | POA: Diagnosis not present

## 2024-01-17 DIAGNOSIS — R8290 Unspecified abnormal findings in urine: Secondary | ICD-10-CM | POA: Diagnosis not present

## 2024-01-17 DIAGNOSIS — R600 Localized edema: Secondary | ICD-10-CM | POA: Diagnosis not present

## 2024-01-17 DIAGNOSIS — N760 Acute vaginitis: Secondary | ICD-10-CM | POA: Diagnosis not present

## 2024-01-17 DIAGNOSIS — N83202 Unspecified ovarian cyst, left side: Secondary | ICD-10-CM | POA: Diagnosis not present

## 2024-01-17 DIAGNOSIS — M0609 Rheumatoid arthritis without rheumatoid factor, multiple sites: Secondary | ICD-10-CM | POA: Diagnosis not present

## 2024-01-17 DIAGNOSIS — Z113 Encounter for screening for infections with a predominantly sexual mode of transmission: Secondary | ICD-10-CM | POA: Diagnosis not present

## 2024-01-17 DIAGNOSIS — Z79899 Other long term (current) drug therapy: Secondary | ICD-10-CM | POA: Diagnosis not present

## 2024-01-18 ENCOUNTER — Other Ambulatory Visit: Payer: Self-pay | Admitting: Family Medicine

## 2024-01-18 DIAGNOSIS — R600 Localized edema: Secondary | ICD-10-CM

## 2024-01-18 DIAGNOSIS — I10 Essential (primary) hypertension: Secondary | ICD-10-CM

## 2024-02-01 DIAGNOSIS — M0609 Rheumatoid arthritis without rheumatoid factor, multiple sites: Secondary | ICD-10-CM | POA: Diagnosis not present

## 2024-02-06 DIAGNOSIS — M5412 Radiculopathy, cervical region: Secondary | ICD-10-CM | POA: Diagnosis not present

## 2024-02-06 DIAGNOSIS — M25511 Pain in right shoulder: Secondary | ICD-10-CM | POA: Diagnosis not present

## 2024-02-06 DIAGNOSIS — M5416 Radiculopathy, lumbar region: Secondary | ICD-10-CM | POA: Diagnosis not present

## 2024-02-06 DIAGNOSIS — M4802 Spinal stenosis, cervical region: Secondary | ICD-10-CM | POA: Diagnosis not present

## 2024-02-06 DIAGNOSIS — R519 Headache, unspecified: Secondary | ICD-10-CM | POA: Diagnosis not present

## 2024-02-06 DIAGNOSIS — L659 Nonscarring hair loss, unspecified: Secondary | ICD-10-CM | POA: Diagnosis not present

## 2024-02-07 ENCOUNTER — Telehealth: Payer: Self-pay | Admitting: Diagnostic Neuroimaging

## 2024-02-07 DIAGNOSIS — N926 Irregular menstruation, unspecified: Secondary | ICD-10-CM | POA: Diagnosis not present

## 2024-02-07 DIAGNOSIS — A5901 Trichomonal vulvovaginitis: Secondary | ICD-10-CM | POA: Diagnosis not present

## 2024-02-07 DIAGNOSIS — Z113 Encounter for screening for infections with a predominantly sexual mode of transmission: Secondary | ICD-10-CM | POA: Diagnosis not present

## 2024-02-07 DIAGNOSIS — N83202 Unspecified ovarian cyst, left side: Secondary | ICD-10-CM | POA: Diagnosis not present

## 2024-02-07 DIAGNOSIS — N83201 Unspecified ovarian cyst, right side: Secondary | ICD-10-CM | POA: Diagnosis not present

## 2024-02-07 NOTE — Telephone Encounter (Signed)
 Patient has been referred back to us  by [facility] for Occipital Neuralgia and they're requesting she be evaluated by Dr. Ines. She was last seen by Dr. Margaret in 2022. Would you both be ok with this?

## 2024-02-07 NOTE — Telephone Encounter (Signed)
 I would prefer Dr. Margaret to see her thank you

## 2024-02-19 ENCOUNTER — Ambulatory Visit (INDEPENDENT_AMBULATORY_CARE_PROVIDER_SITE_OTHER)

## 2024-02-19 DIAGNOSIS — J309 Allergic rhinitis, unspecified: Secondary | ICD-10-CM

## 2024-03-03 ENCOUNTER — Ambulatory Visit (INDEPENDENT_AMBULATORY_CARE_PROVIDER_SITE_OTHER)

## 2024-03-03 DIAGNOSIS — J309 Allergic rhinitis, unspecified: Secondary | ICD-10-CM

## 2024-03-06 DIAGNOSIS — M0609 Rheumatoid arthritis without rheumatoid factor, multiple sites: Secondary | ICD-10-CM | POA: Diagnosis not present

## 2024-03-16 ENCOUNTER — Other Ambulatory Visit: Payer: Self-pay | Admitting: Allergy

## 2024-03-18 DIAGNOSIS — J101 Influenza due to other identified influenza virus with other respiratory manifestations: Secondary | ICD-10-CM | POA: Diagnosis not present

## 2024-03-19 ENCOUNTER — Ambulatory Visit: Admitting: Allergy

## 2024-03-28 DIAGNOSIS — Z79899 Other long term (current) drug therapy: Secondary | ICD-10-CM | POA: Diagnosis not present

## 2024-03-28 DIAGNOSIS — R600 Localized edema: Secondary | ICD-10-CM | POA: Diagnosis not present

## 2024-03-31 DIAGNOSIS — M0609 Rheumatoid arthritis without rheumatoid factor, multiple sites: Secondary | ICD-10-CM | POA: Diagnosis not present

## 2024-03-31 DIAGNOSIS — R600 Localized edema: Secondary | ICD-10-CM | POA: Diagnosis not present

## 2024-03-31 DIAGNOSIS — Z79899 Other long term (current) drug therapy: Secondary | ICD-10-CM | POA: Diagnosis not present

## 2024-04-03 DIAGNOSIS — M0609 Rheumatoid arthritis without rheumatoid factor, multiple sites: Secondary | ICD-10-CM | POA: Diagnosis not present

## 2024-04-10 DIAGNOSIS — Z30431 Encounter for routine checking of intrauterine contraceptive device: Secondary | ICD-10-CM | POA: Diagnosis not present

## 2024-04-10 DIAGNOSIS — N939 Abnormal uterine and vaginal bleeding, unspecified: Secondary | ICD-10-CM | POA: Diagnosis not present

## 2024-04-10 DIAGNOSIS — N83201 Unspecified ovarian cyst, right side: Secondary | ICD-10-CM | POA: Diagnosis not present

## 2024-04-10 DIAGNOSIS — N83202 Unspecified ovarian cyst, left side: Secondary | ICD-10-CM | POA: Diagnosis not present

## 2024-04-26 DIAGNOSIS — Z1211 Encounter for screening for malignant neoplasm of colon: Secondary | ICD-10-CM | POA: Diagnosis not present

## 2024-04-29 ENCOUNTER — Other Ambulatory Visit: Payer: Self-pay | Admitting: Family Medicine

## 2024-04-29 DIAGNOSIS — M25511 Pain in right shoulder: Secondary | ICD-10-CM | POA: Diagnosis not present

## 2024-04-29 DIAGNOSIS — M25521 Pain in right elbow: Secondary | ICD-10-CM | POA: Diagnosis not present

## 2024-04-29 DIAGNOSIS — F331 Major depressive disorder, recurrent, moderate: Secondary | ICD-10-CM

## 2024-05-01 DIAGNOSIS — M0609 Rheumatoid arthritis without rheumatoid factor, multiple sites: Secondary | ICD-10-CM | POA: Diagnosis not present

## 2024-05-01 LAB — COLOGUARD: COLOGUARD: NEGATIVE

## 2024-05-07 DIAGNOSIS — J3081 Allergic rhinitis due to animal (cat) (dog) hair and dander: Secondary | ICD-10-CM | POA: Diagnosis not present

## 2024-05-07 DIAGNOSIS — J3089 Other allergic rhinitis: Secondary | ICD-10-CM | POA: Diagnosis not present

## 2024-05-07 DIAGNOSIS — J302 Other seasonal allergic rhinitis: Secondary | ICD-10-CM | POA: Diagnosis not present

## 2024-05-07 DIAGNOSIS — J301 Allergic rhinitis due to pollen: Secondary | ICD-10-CM | POA: Diagnosis not present

## 2024-05-07 NOTE — Progress Notes (Signed)
 VIALS MADE 05-07-24

## 2024-05-21 ENCOUNTER — Ambulatory Visit (INDEPENDENT_AMBULATORY_CARE_PROVIDER_SITE_OTHER)

## 2024-05-21 DIAGNOSIS — J309 Allergic rhinitis, unspecified: Secondary | ICD-10-CM | POA: Diagnosis not present

## 2024-05-23 ENCOUNTER — Encounter: Payer: Self-pay | Admitting: Allergy

## 2024-05-23 ENCOUNTER — Telehealth: Payer: Self-pay

## 2024-05-23 ENCOUNTER — Ambulatory Visit: Admitting: Allergy

## 2024-05-23 ENCOUNTER — Other Ambulatory Visit: Payer: Self-pay

## 2024-05-23 VITALS — BP 99/80 | HR 77 | Temp 98.1°F | Resp 16 | Ht 63.25 in | Wt 142.8 lb

## 2024-05-23 DIAGNOSIS — J4540 Moderate persistent asthma, uncomplicated: Secondary | ICD-10-CM

## 2024-05-23 DIAGNOSIS — J309 Allergic rhinitis, unspecified: Secondary | ICD-10-CM

## 2024-05-23 MED ORDER — FEXOFENADINE HCL 180 MG PO TABS: 180.0000 mg | ORAL_TABLET | Freq: Two times a day (BID) | ORAL | 1 refills | Status: AC

## 2024-05-23 MED ORDER — AIRSUPRA 90-80 MCG/ACT IN AERO: 2.0000 | INHALATION_SPRAY | RESPIRATORY_TRACT | 1 refills | Status: AC | PRN

## 2024-05-23 MED ORDER — MOMETASONE FUROATE 50 MCG/ACT NA SUSP: NASAL | 1 refills | Status: AC

## 2024-05-23 MED ORDER — BREZTRI AEROSPHERE 160-9-4.8 MCG/ACT IN AERO
2.0000 | INHALATION_SPRAY | Freq: Two times a day (BID) | RESPIRATORY_TRACT | 1 refills | Status: AC
Start: 2024-05-23 — End: ?

## 2024-05-23 MED ORDER — ALBUTEROL SULFATE HFA 108 (90 BASE) MCG/ACT IN AERS: 2.0000 | INHALATION_SPRAY | RESPIRATORY_TRACT | 1 refills | Status: AC | PRN

## 2024-05-23 NOTE — Progress Notes (Signed)
 Follow-up Note  RE: [name] MRN: [id number] DOB: Jan 01, 1976 Date of Office Visit: 05/23/2024   History of present illness: Anne Jefferson is a 48 y.o. female presenting today for follow-up of asthma, allergic rhinitis.  She was last seen in the office for visit on 10/12/22 by myself.  Discussed the use of AI scribe software for clinical note transcription with the patient, who gave verbal consent to proceed.  This year has been unusual for outdoor allergens, with all types of pollen present due to weather conditions. She does not feel allergic to grass, despite it being included in her allergy shots, and primarily identifies animals as her main allergens. She has a history of asthma exacerbated by exposure to cats and dogs, although she notes significant improvement with allergy shots. She owns 2 cats and has experienced a reduction in symptoms, no longer requiring emergency care when exposed to cats. She had a lapse in her allergy shot regimen due to chronic pain and other medical conditions but has resumed treatment. She wants to complete her allergy shot protocol to maintain long-term benefits.  She continues to use nasal rinses as needed, particularly during allergy outbreaks or colds. Her regimen includes nasal sprays and inhalers, specifically Breztri  twice a day use and Airsupra , which she uses infrequently as a rescue inhaler. She mentions a peculiar reaction to green [employer], which causes mild breathing difficulty, but not with other colors thus she just avoids them. She uses Nasonex  regularly and feels unwell if she misses doses for a few days. She has been on nasal steroids for a long time and finds them effective in managing her symptoms. She also uses azelastine  for nasal drainage when needed but notes the bad taste.  She has a history of nasal polyps and underwent surgery She manages her condition with Nasonex . No ear issues and has never had an ear  infection.  Review of systems: 10pt ROS negative unless noted above in HPI  Past medical/social/surgical/family history have been reviewed and are unchanged unless specifically indicated below.  No changes  Medication List: Current Outpatient Medications  Medication Sig Dispense Refill   acetaminophen  (TYLENOL ) 500 MG tablet Take 500 mg by mouth every 6 (six) hours as needed.     albuterol  (PROVENTIL ) (2.5 MG/3ML) 0.083% nebulizer solution Use 1 unit dose via the nebulizer every 6 hours as needed for cough, wheeze, tightness in chest, or shortness of breath. 75 mL 3   AUVI-Q  0.3 MG/0.3ML SOAJ injection Inject 0.3 mg into the muscle as needed for anaphylaxis. 1 each 1   budesonide  (PULMICORT ) 0.5 MG/2ML nebulizer solution TAKE 2 MLS (0.5 MG TOTAL) BY NEBULIZATION 3 (THREE) TIMES DAILY AS NEEDED (ASTHMA FLARE). 540 mL 1   celecoxib  (CELEBREX ) 100 MG capsule Take 100 mg by mouth 2 (two) times daily.     chlorthalidone  (HYGROTON ) 25 MG tablet TAKE 1 TABLET (25 MG TOTAL) BY MOUTH DAILY. 90 tablet 1   docusate sodium  (COLACE) 100 MG capsule Take 1 capsule (100 mg total) by mouth every 12 (twelve) hours. 60 capsule 0   DULoxetine  (CYMBALTA ) 60 MG capsule TAKE 1 CAPSULE BY MOUTH EVERY DAY 90 capsule 2   EPINEPHrine  (AUVI-Q ) 0.3 mg/0.3 mL IJ SOAJ injection Inject 0.3 mg into the muscle as needed for anaphylaxis. 2 each 1   folic acid  (FOLVITE ) 1 MG tablet Take 1 mg by mouth daily. Everyday but Thursdays     golimumab  (SIMPONI  ARIA) 50 MG/4ML SOLN injection Inject 50 mg into the  vein every 8 (eight) weeks.     HYDROcodone -acetaminophen  (NORCO) 10-325 MG tablet Take 1 tablet by mouth 4 (four) times daily.     hydroxychloroquine  (PLAQUENIL ) 200 MG tablet Take 200 mg by mouth 2 (two) times daily.     levonorgestrel  (MIRENA , 52 MG,) 20 MCG/DAY IUD 1 each by Intrauterine route.     melatonin (MELATONIN MAXIMUM STRENGTH) 5 MG TABS Take 5 mg by mouth.     methotrexate  (RHEUMATREX) 2.5 MG tablet Take 8  tablets by mouth once a week. Caution:Chemotherapy. Protect from light. Takes on Thursdays     ondansetron  (ZOFRAN -ODT) 4 MG disintegrating tablet Dissolve 1 tablet (4 mg total) by mouth daily as needed for nausea or vomiting. 30 tablet 0   pantoprazole  (PROTONIX ) 40 MG tablet TAKE 1 TABLET BY MOUTH EVERY DAY 90 tablet 2   pregabalin (LYRICA) 50 MG capsule Take 50 mg by mouth 3 (three) times daily as needed (pain).     tirzepatide (ZEPBOUND) 2.5 MG/0.5ML Pen Inject 2.5 mg into the skin.     VITAMIN D PO Take 1 tablet by mouth daily.     albuterol  (PROVENTIL  HFA) 108 (90 Base) MCG/ACT inhaler Inhale 2 puffs into the lungs every 4 (four) hours as needed for wheezing or shortness of breath. 54 g 1   Albuterol -Budesonide  (AIRSUPRA ) 90-80 MCG/ACT AERO Inhale 2 puffs into the lungs every 4 (four) hours as needed (cough, wheeze, shortness of breath). 32.1 g 1   BREZTRI  AEROSPHERE 160-9-4.8 MCG/ACT AERO inhaler Inhale 2 puffs into the lungs in the morning and at bedtime. 32.1 g 1   fexofenadine  (ALLEGRA ) 180 MG tablet Take 1 tablet (180 mg total) by mouth 2 (two) times daily. 180 tablet 1   mometasone  (NASONEX ) 50 MCG/ACT nasal spray 1 spray each nostril twice daily for nasal congestion control. 51 each 1   No current facility-administered medications for this visit.     Known medication allergies: Allergies  Allergen Reactions   Cat Dander     Other Reaction(s): Other   Promethazine Other (See Comments)    Hallucinations   Theophyllines Nausea And Vomiting   Mite (D. Farinae) Other (See Comments)    Cats, dogs, dust    Molds & Smuts Other (See Comments)     Physical examination: Blood pressure 99/80, pulse 77, temperature 98.1 F (36.7 C), resp. rate 16, height 5' 3.25 (1.607 m), weight 142 lb 12.8 oz (64.8 kg), SpO2 95%.  General: Alert, interactive, in no acute distress. HEENT: PERRLA, TMs pearly gray, turbinates mildly edematous without discharge, post-pharynx non  erythematous. Neck: Supple without lymphadenopathy. Lungs: Clear to auscultation without wheezing, rhonchi or rales. {no increased work of breathing. CV: Normal S1, S2 without murmurs. Abdomen: Nondistended, nontender. Skin: Warm and dry, without lesions or rashes. Extremities:  No clubbing, cyanosis or edema. Neuro:   Grossly intact.  Diagnostics/Labs:  Spirometry: FEV1: 2.5L 91%, FVC: 3.17L 92%, ratio consistent with nonobstructive pattern  Immunotherapy injections give in office today  Assessment and plan: Asthma Allergic rhinitis  - Continue Breztri  2 puffs twice a day with a spacer to prevent cough or wheeze - Use AirSupra  2 puffs every 4-6 hours as needed (do not exceed more than 12 puffs/day) OR albuterol  2 puffs every 4-6 hours as needed for cough or wheeze or prior to activity.  -Continue to rinse mouth after inhaler use.    Asthma control goals:  Full participation in all desired activities (may need albuterol  before activity) Albuterol  use two time or less  a week on average (not counting use with activity) Cough interfering with sleep two time or less a month Oral steroids no more than once a year No hospitalizations  - Perform nasal saline rinses before nose sprays. Use distilled water.   - Continue Nasonex  1 sprays each nostril twice daily for nasal congestion control.  Aim upward and outward toward eye on same side and lean a bit forward for best technique and decrease drainage into throat (if you taste it you waste it). - Use Azelastine  1-2 sprays each nostril twice daily for nasal drainage control. Aim upward and outward toward eye on same side and lean a bit forward for best technique and decrease drainage into throat (if you taste it you waste it). - Use Allegra  180mg  daily.  - Continue allergen immunotherapy per protocol and have access to an epinephrine  device  Follow-up in 6 months  I appreciate the opportunity to take part in Seleste's care. Please do  not hesitate to contact me with questions.  Sincerely,   Danita Brain, MD Allergy/Immunology [facility]

## 2024-05-23 NOTE — Telephone Encounter (Signed)
*  AA  Pharmacy Patient Advocate Encounter   Received notification from CoverMyMeds that prior authorization for Fexofenadine  HCl 180MG  tablets   is required/requested.   Insurance verification completed.   The patient is insured through [employer] MEDICAID.   Per test claim: PA required; PA started via CoverMyMeds. KEY [id number] . Waiting for clinical questions to populate.

## 2024-05-23 NOTE — Patient Instructions (Addendum)
 Asthma Allergic rhinitis  - Continue Breztri  2 puffs twice a day with a spacer to prevent cough or wheeze - Use AirSupra  2 puffs every 4-6 hours as needed (do not exceed more than 12 puffs/day) OR albuterol  2 puffs every 4-6 hours as needed for cough or wheeze or prior to activity.  -Continue to rinse mouth after inhaler use.    Asthma control goals:  Full participation in all desired activities (may need albuterol  before activity) Albuterol  use two time or less a week on average (not counting use with activity) Cough interfering with sleep two time or less a month Oral steroids no more than once a year No hospitalizations  - Perform nasal saline rinses before nose sprays. Use distilled water.   - Continue Nasonex  1 sprays each nostril twice daily for nasal congestion control.  Aim upward and outward toward eye on same side and lean a bit forward for best technique and decrease drainage into throat (if you taste it you waste it). - Use Azelastine  1-2 sprays each nostril twice daily for nasal drainage control. Aim upward and outward toward eye on same side and lean a bit forward for best technique and decrease drainage into throat (if you taste it you waste it). - Use Allegra  180mg  daily.  - Continue allergen immunotherapy per protocol and have access to an epinephrine  device  Follow-up in 6 months

## 2024-05-26 NOTE — Telephone Encounter (Signed)
 Additional information needed on alternative medications tried and failed for patients diagnosis.

## 2024-05-27 NOTE — Telephone Encounter (Signed)
 Additional information submitted to plan, pending determination.

## 2024-05-27 NOTE — Telephone Encounter (Signed)
 Denied due to patient not having trial/failure to a preferred alternative of :   cetirizine Over the Counter (OTC) syrup 1mg /84ml (generic for Zyrtec OTC Syrup), cetirizine prescription syrup (generic for Zyrtec Syrup), cetirizine tablet (generic for for Zyrtec), loratadine  tablet (generic for Claritin )

## 2024-05-29 DIAGNOSIS — Z1231 Encounter for screening mammogram for malignant neoplasm of breast: Secondary | ICD-10-CM | POA: Diagnosis not present

## 2024-05-29 DIAGNOSIS — M0609 Rheumatoid arthritis without rheumatoid factor, multiple sites: Secondary | ICD-10-CM | POA: Diagnosis not present

## 2024-05-29 DIAGNOSIS — Z124 Encounter for screening for malignant neoplasm of cervix: Secondary | ICD-10-CM | POA: Diagnosis not present

## 2024-05-29 DIAGNOSIS — Z6825 Body mass index (BMI) 25.0-25.9, adult: Secondary | ICD-10-CM | POA: Diagnosis not present

## 2024-05-29 DIAGNOSIS — Z1151 Encounter for screening for human papillomavirus (HPV): Secondary | ICD-10-CM | POA: Diagnosis not present

## 2024-05-29 DIAGNOSIS — Z01419 Encounter for gynecological examination (general) (routine) without abnormal findings: Secondary | ICD-10-CM | POA: Diagnosis not present

## 2024-06-02 NOTE — Telephone Encounter (Signed)
 I called and informed the patient. She said she is able to get otc.

## 2024-06-05 ENCOUNTER — Ambulatory Visit (INDEPENDENT_AMBULATORY_CARE_PROVIDER_SITE_OTHER)

## 2024-06-05 DIAGNOSIS — J309 Allergic rhinitis, unspecified: Secondary | ICD-10-CM

## 2024-06-12 ENCOUNTER — Ambulatory Visit (INDEPENDENT_AMBULATORY_CARE_PROVIDER_SITE_OTHER)

## 2024-06-12 DIAGNOSIS — M5412 Radiculopathy, cervical region: Secondary | ICD-10-CM | POA: Diagnosis not present

## 2024-06-12 DIAGNOSIS — J309 Allergic rhinitis, unspecified: Secondary | ICD-10-CM

## 2024-06-12 DIAGNOSIS — M5416 Radiculopathy, lumbar region: Secondary | ICD-10-CM | POA: Diagnosis not present

## 2024-06-13 ENCOUNTER — Ambulatory Visit: Admitting: Diagnostic Neuroimaging

## 2024-06-13 ENCOUNTER — Encounter: Payer: Self-pay | Admitting: Diagnostic Neuroimaging

## 2024-06-13 VITALS — BP 115/82 | HR 82 | Ht 63.0 in | Wt 139.6 lb

## 2024-06-13 DIAGNOSIS — M5481 Occipital neuralgia: Secondary | ICD-10-CM

## 2024-06-13 NOTE — Progress Notes (Signed)
 [facility]  PATIENT: Anne Jefferson DOB: February 12, 1976  REFERRING CLINICIAN: Bonner Ade, MD HISTORY FROM: patient  REASON FOR VISIT: new consult    HISTORICAL  CHIEF COMPLAINT:  Chief Complaint  Patient presents with   New Patient (Initial Visit)    Pt in room 6. Alone.Occipital neuralgia - NX Dr. Kenzy Campoverde-l/s 2022/proficient paper referral    HISTORY OF PRESENT ILLNESS:   UPDATE (06/13/24, VRP): Since last visit, patient was diagnosed with rheumatoid arthritis, now on disease modifying therapy.  Continues to have diffuse joint pain throughout her body.  More recently has noticed some intermittent sharp shooting pains in her right occipital region.  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  Has also been getting cervical epidural steroid injections without relief.  Planning to get ketamine treatment through [facility] to help with her chronic pain issues.  PRIOR HPI (09/09/20, VRP): 48 year old female with new onset numbness and pain.  Symptoms started around December 2021.  She describes numbness and pain in her toes and feet, starting on the right side and spreading to the left.  Then she had pain in her fingertips and arms.  She is also had swollen sensation throughout her body.  Also having headaches, gait and balance difficulty.  She went to PCP and then rheumatology.  No specific causes have been found for her symptoms.  She has tried gabapentin  with mild relief.   REVIEW OF SYSTEMS: Full 14 system review of systems performed and negative with exception of: As per HPI.  ALLERGIES: Allergies  Allergen Reactions   Cat Dander     Other Reaction(s): Other   Promethazine Other (See Comments)    Hallucinations   Theophyllines Nausea And Vomiting   Mite (D. Farinae) Other (See Comments)    Cats, dogs, dust    Molds & Smuts Other (See Comments)    HOME MEDICATIONS: Outpatient  Medications Prior to Visit  Medication Sig Dispense Refill   Abatacept  (ORENCIA  IV) Inject into the vein every 30 (thirty) days.     acetaminophen  (TYLENOL ) 500 MG tablet Take 500 mg by mouth every 6 (six) hours as needed. (Patient taking differently: Take 500 mg by mouth 2 (two) times daily.)     albuterol  (PROVENTIL  HFA) 108 (90 Base) MCG/ACT inhaler Inhale 2 puffs into the lungs every 4 (four) hours as needed for wheezing or shortness of breath. 54 g 1   albuterol  (PROVENTIL ) (2.5 MG/3ML) 0.083% nebulizer solution Use 1 unit dose via the nebulizer every 6 hours as needed for cough, wheeze, tightness in chest, or shortness of breath. 75 mL 3   Albuterol -Budesonide  (AIRSUPRA ) 90-80 MCG/ACT AERO Inhale 2 puffs into the lungs every 4 (four) hours as needed (cough, wheeze, shortness of breath). 32.1 g 1   AUVI-Q  0.3 MG/0.3ML SOAJ injection Inject 0.3 mg into the muscle as needed for anaphylaxis. 1 each 1   BREZTRI  AEROSPHERE 160-9-4.8 MCG/ACT AERO inhaler Inhale 2 puffs into the lungs in the morning and at bedtime. 32.1 g 1   budesonide  (PULMICORT ) 0.5 MG/2ML nebulizer solution TAKE 2 MLS (0.5 MG TOTAL) BY NEBULIZATION 3 (THREE) TIMES DAILY AS NEEDED (ASTHMA FLARE). 540 mL 1   celecoxib  (CELEBREX ) 100 MG capsule Take 100 mg by mouth 2 (two) times daily.     docusate sodium  (COLACE) 100 MG capsule Take 1 capsule (100 mg total) by mouth every 12 (twelve) hours. 60 capsule 0   DULoxetine  (CYMBALTA ) 60 MG capsule TAKE 1 CAPSULE BY  MOUTH EVERY DAY 90 capsule 2   EPINEPHrine  (AUVI-Q ) 0.3 mg/0.3 mL IJ SOAJ injection Inject 0.3 mg into the muscle as needed for anaphylaxis. 2 each 1   fexofenadine  (ALLEGRA ) 180 MG tablet Take 1 tablet (180 mg total) by mouth 2 (two) times daily. 180 tablet 1   folic acid  (FOLVITE ) 1 MG tablet Take 1 mg by mouth daily. Everyday but Thursdays     golimumab  (SIMPONI  ARIA) 50 MG/4ML SOLN injection Inject 50 mg into the vein every 8 (eight) weeks.     HYDROcodone -acetaminophen   (NORCO) 10-325 MG tablet Take 1 tablet by mouth 4 (four) times daily.     hydroxychloroquine  (PLAQUENIL ) 200 MG tablet Take 200 mg by mouth 2 (two) times daily.     levonorgestrel  (MIRENA , 52 MG,) 20 MCG/DAY IUD 1 each by Intrauterine route.     melatonin (MELATONIN MAXIMUM STRENGTH) 5 MG TABS Take 5 mg by mouth.     methotrexate  (RHEUMATREX) 2.5 MG tablet Take 8 tablets by mouth once a week. Caution:Chemotherapy. Protect from light. Takes on Thursdays     mometasone  (NASONEX ) 50 MCG/ACT nasal spray 1 spray each nostril twice daily for nasal congestion control. 51 each 1   ondansetron  (ZOFRAN -ODT) 4 MG disintegrating tablet Dissolve 1 tablet (4 mg total) by mouth daily as needed for nausea or vomiting. 30 tablet 0   pregabalin (LYRICA) 50 MG capsule Take 50 mg by mouth 3 (three) times daily as needed (pain).     tirzepatide (ZEPBOUND) 2.5 MG/0.5ML Pen Inject 2.5 mg into the skin.     VITAMIN D PO Take 1 tablet by mouth daily.     chlorthalidone  (HYGROTON ) 25 MG tablet TAKE 1 TABLET (25 MG TOTAL) BY MOUTH DAILY. (Patient not taking: Reported on 06/13/2024) 90 tablet 1   pantoprazole  (PROTONIX ) 40 MG tablet TAKE 1 TABLET BY MOUTH EVERY DAY (Patient not taking: Reported on 06/13/2024) 90 tablet 2   No facility-administered medications prior to visit.    PAST MEDICAL HISTORY: Past Medical History:  Diagnosis Date   Abnormal Pap smear    Abnormal TSH 02/03/2022   Acute cough 10/20/2021   Acute non-recurrent frontal sinusitis 09/17/2018   Adult stuttering 02/01/2022   Alternating constipation and diarrhea 10/03/2022   Anxiety    no meds   Asthma    mild, no issue for years no  inhaler use except in winter   Bilateral lower extremity edema 04/20/2023   Biliary colic    Blood type, Rh negative    BUNIONS, LEFT FOOT 12/18/2007   Qualifier: Diagnosis of   By: HARVEY MD, KARL         Cervical spondylosis with radiculopathy 02/02/2022   Chronic generalized pain disorder 07/29/2020   Edema  08/08/2023   Essential (primary) hypertension 10/03/2022   Gallstones    Gastroesophageal reflux disease without esophagitis 10/03/2022   H/O dysmenorrhea    History of nasal polyposis 11/17/2021   Hyperlipidemia 10/03/2022   Infertility, female    Lightheadedness 02/03/2022   Major depressive disorder, recurrent episode, moderate (HCC) 01/09/2023   METATARSALGIA 12/18/2007   Qualifier: Diagnosis of   By: HARVEY MD, KARL      Replacing diagnoses that were inactivated after the 10/23/22 regulatory import     Moderate persistent asthma with (acute) exacerbation 11/16/2020   Moderate persistent asthma without complication 12/23/2020   Nausea and vomiting    Nausea without vomiting 04/20/2023   Pain    back and chest realted to gallbladder since november 2018  Perennial and seasonal allergic rhinitis 09/17/2018   PONV (postoperative nausea and vomiting)    severe, needs heavy anti nausea meds   Rheumatoid arthritis (HCC) 02/02/2022   Severe obesity (BMI 35.0-39.9) with comorbidity (HCC) 02/03/2022   SOB (shortness of breath) 04/20/2023   Spinal stenosis    UNEQUAL LEG LENGTH 12/18/2007   Qualifier: Diagnosis of   By: HARVEY MD, KARL         Venous insufficiency of both lower extremities 03/12/2023    PAST SURGICAL HISTORY: Past Surgical History:  Procedure Laterality Date    c sections     x 2  3 births last was twins   BUNIONECTOMY Right    CHOLECYSTECTOMY N/A 11/22/2017   Procedure: LAPAROSCOPIC CHOLECYSTECTOMY ERAS PATHWAY;  Surgeon: Stevie Herlene Righter, MD;  Location: [facility];  Service: General;  Laterality: N/A;   LEEP  12/23/2009   STRABISMUS SURGERY Left 10/2022   WISDOM TOOTH EXTRACTION      FAMILY HISTORY: Family History  Problem Relation Age of Onset   Allergic rhinitis Mother    Osteoarthritis Mother    Prostate cancer Father    Healthy Brother    Healthy Brother    Breast cancer Maternal Aunt    Diabetes Maternal Grandmother    Skin cancer Maternal  Grandmother    Celiac disease Son    Autoimmune disease Son    Anemia Son    Diabetes Son        borderline   Pancreatic cancer Neg Hx    Ovarian cancer Neg Hx    Endometrial cancer Neg Hx    Colon cancer Neg Hx     SOCIAL HISTORY: Social History   Socioeconomic History   Marital status: Divorced    Spouse name: Not on file   Number of children: 1   Years of education: Not on file   Highest education level: Master's degree (e.g., MA, MS, MEng, MEd, MSW, MBA)  Occupational History    Comment: doctor, 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  Tobacco Use   Smoking status: Never   Smokeless tobacco: Never  Vaping Use   Vaping status: Never Used  Substance and Sexual Activity   Alcohol use: Never   Drug use: Not Currently    Types: Marijuana    Comment: occ gummy for pain relief   Sexual activity: Yes    Birth control/protection: None  Other Topics Concern   Not on file  Social History Narrative   Not on file   Social Drivers of Health   Financial Resource Strain: Low Risk  (01/29/2023)   Received from [facility]   Overall Financial Resource Strain (CARDIA)    Difficulty of Paying Living Expenses: Not very hard  Food Insecurity: Low Risk  (01/15/2024)   Received from [facility]   Hunger Vital Sign    Within the past 12 months, you worried that your food would run out before you got money to buy more: Never true    Within the past 12 months, the food you bought just didn't last and you didn't have money to get more. : Never true  Transportation Needs: No Transportation Needs (01/15/2024)   Received from [facility]    In the past 12 months, has lack of reliable transportation kept you from medical appointments, meetings, work or from getting things needed for daily living? : No  Physical Activity: Insufficiently Active (01/29/2023)   Received from [facility]   Exercise Vital Sign    On  average, how many days per week do you engage in moderate to strenuous  exercise (like a brisk walk)?: 1 day    On average, how many minutes do you engage in exercise at this level?: 10 min  Stress: No Stress Concern Present (01/29/2023)   Received from [facility] - Occupational Stress Questionnaire    Feeling of Stress : Not at all  Social Connections: Socially Integrated (01/29/2023)   Received from [facility]   Social Network    How would you rate your social network (family, work, friends)?: Good participation with social networks  Intimate Partner Violence: Not At Risk (01/29/2023)   Received from [facility]   HITS    Over the last 12 months how often did your partner physically hurt you?: Never    Over the last 12 months how often did your partner insult you or talk down to you?: Never    Over the last 12 months how often did your partner threaten you with physical harm?: Never    Over the last 12 months how often did your partner scream or curse at you?: Never     PHYSICAL EXAM  GENERAL EXAM/CONSTITUTIONAL: Vitals:  Vitals:   06/13/24 1110  BP: 115/82  Pulse: 82  SpO2: 94%  Weight: 139 lb 9.6 oz (63.3 kg)  Height: 5' 3 (1.6 m)   Body mass index is 24.73 kg/m. Wt Readings from Last 3 Encounters:  06/13/24 139 lb 9.6 oz (63.3 kg)  05/23/24 142 lb 12.8 oz (64.8 kg)  11/19/23 184 lb 9.6 oz (83.7 kg)   Patient is in no distress; well developed, nourished and groomed; neck is supple  CARDIOVASCULAR: Examination of carotid arteries is normal; no carotid bruits Regular rate and rhythm, no murmurs Examination of peripheral vascular system by observation and palpation is normal  EYES: Ophthalmoscopic exam of optic discs and posterior segments is normal; no papilledema or hemorrhages Vision Screening   Right eye Left eye Both eyes  Without correction 20/50 20/40 2070  With correction       MUSCULOSKELETAL: Gait, strength, tone, movements noted in Neurologic exam below  NEUROLOGIC: MENTAL  STATUS:      No data to display         awake, alert, oriented to person, place and time recent and remote memory intact normal attention and concentration language fluent, comprehension intact, naming intact fund of knowledge appropriate  CRANIAL NERVE:  2nd - no papilledema on fundoscopic exam 2nd, 3rd, 4th, 6th - pupils equal and reactive to light, visual fields full to confrontation, extraocular muscles intact, no nystagmus 5th - facial sensation symmetric 7th - facial strength symmetric 8th - hearing intact 9th - palate elevates symmetrically, uvula midline 11th - shoulder shrug symmetric 12th - tongue protrusion midline  MOTOR:  normal bulk and tone, full strength in the BUE, BLE  SENSORY:  normal and symmetric to light touch, temperature, vibration  COORDINATION:  finger-nose-finger, fine finger movements normal  REFLEXES:  deep tendon reflexes TRACE and symmetric  GAIT/STATION:  narrow based gait     DIAGNOSTIC DATA (LABS, IMAGING, TESTING) - I reviewed patient records, labs, notes, testing and imaging myself where available.  Lab Results  Component Value Date   WBC 6.9 04/20/2023   HGB 12.3 04/20/2023   HCT 38.2 04/20/2023   MCV 88.8 04/20/2023   PLT 249.0 04/20/2023      Component Value Date/Time   NA 137 08/09/2023 1326  NA 140 08/31/2020 1617   K 3.8 08/09/2023 1326   CL 96 08/09/2023 1326   CO2 33 (H) 08/09/2023 1326   GLUCOSE 104 (H) 08/09/2023 1326   BUN 21 08/09/2023 1326   BUN 18 08/31/2020 1617   CREATININE 0.84 08/09/2023 1326   CALCIUM 9.8 08/09/2023 1326   PROT 6.7 04/20/2023 0825   PROT 7.0 08/31/2020 1617   ALBUMIN 4.1 04/20/2023 0825   ALBUMIN 4.4 08/31/2020 1617   AST 18 04/20/2023 0825   ALT 19 04/20/2023 0825   ALKPHOS 52 04/20/2023 0825   BILITOT 0.4 04/20/2023 0825   BILITOT <0.2 08/31/2020 1617   GFRNONAA >60 06/01/2022 1227   GFRAA 112 08/31/2020 1617   Lab Results  Component Value Date   CHOL 171 02/03/2022    HDL 51 02/03/2022   LDLCALC 88 02/03/2022   TRIG 162 (H) 02/03/2022   CHOLHDL 3.4 02/03/2022   Lab Results  Component Value Date   HGBA1C 5.8 10/03/2022   Lab Results  Component Value Date   VITAMINB12 405 08/31/2020   Lab Results  Component Value Date   TSH 1.34 08/09/2023    02/02/22 MRI HEAD IMPRESSION: 1. Normal brain MRI.  No acute intracranial abnormality. 2. Acute left sphenoid sinusitis.   02/02/22 MRI CERVICAL SPINE IMPRESSION: 1. Normal MRI of the cervical spinal cord.  No acute abnormality. 2. Degenerative spondylosis at C5-6 and C6-7 with resultant mild spinal stenosis, with mild bilateral C6 and moderate left C7 foraminal narrowing. 3. Additional mild noncompressive disc bulging elsewhere within the cervical spine without significant stenosis or neural impingement.   ASSESSMENT AND PLAN  48 y.o. year old female here with onset of numbness, pain, headaches, gait and balance ability in December 2021.    Meds tried: gabapentin , pregabalin, duloxetine , celebrex , tylenol , hydrocodone   Dx:  1. Cervico-occipital neuralgia of right side     PLAN:  RIGHT OCCIPITAL NEURALGIA (intermittent; in remission currently) - consider right occipital nerve block; continue pain mgmt treatments per Dr. Bonner  RHEUMATOID ARTHRITIS - continue orencia , methotrexate , hydroxychloroquine   Return for return to referring provider.    EDUARD FABIENE HANLON, MD 06/13/2024, 11:46 AM Certified in Neurology, Neurophysiology and Neuroimaging  [facility] [address] [place], [place] [address] [phone]

## 2024-06-13 NOTE — Patient Instructions (Signed)
  RIGHT OCCIPITAL NEURALGIA (intermittent; in remission currently) - consider right occipital nerve block; continue pain mgmt treatments per Dr. Bonner  RHEUMATOID ARTHRITIS - continue orencia , methotrexate , hydroxychloroquine

## 2024-06-18 ENCOUNTER — Ambulatory Visit (INDEPENDENT_AMBULATORY_CARE_PROVIDER_SITE_OTHER)

## 2024-06-18 DIAGNOSIS — J309 Allergic rhinitis, unspecified: Secondary | ICD-10-CM

## 2024-06-24 ENCOUNTER — Ambulatory Visit

## 2024-06-24 DIAGNOSIS — J309 Allergic rhinitis, unspecified: Secondary | ICD-10-CM

## 2024-07-04 DIAGNOSIS — M0609 Rheumatoid arthritis without rheumatoid factor, multiple sites: Secondary | ICD-10-CM | POA: Diagnosis not present

## 2024-07-08 DIAGNOSIS — R600 Localized edema: Secondary | ICD-10-CM | POA: Diagnosis not present

## 2024-07-08 DIAGNOSIS — M0609 Rheumatoid arthritis without rheumatoid factor, multiple sites: Secondary | ICD-10-CM | POA: Diagnosis not present

## 2024-07-08 DIAGNOSIS — Z79899 Other long term (current) drug therapy: Secondary | ICD-10-CM | POA: Diagnosis not present

## 2024-07-09 DIAGNOSIS — J069 Acute upper respiratory infection, unspecified: Secondary | ICD-10-CM | POA: Diagnosis not present

## 2024-07-09 DIAGNOSIS — R509 Fever, unspecified: Secondary | ICD-10-CM | POA: Diagnosis not present

## 2024-07-12 DIAGNOSIS — A499 Bacterial infection, unspecified: Secondary | ICD-10-CM | POA: Diagnosis not present

## 2024-07-12 DIAGNOSIS — N390 Urinary tract infection, site not specified: Secondary | ICD-10-CM | POA: Diagnosis not present

## 2024-08-01 ENCOUNTER — Other Ambulatory Visit: Payer: Self-pay | Admitting: Urology

## 2024-08-01 ENCOUNTER — Encounter: Payer: Self-pay | Admitting: Urology

## 2024-08-01 DIAGNOSIS — R310 Gross hematuria: Secondary | ICD-10-CM

## 2024-08-04 ENCOUNTER — Inpatient Hospital Stay: Admission: RE | Admit: 2024-08-04 | Discharge: 2024-08-04 | Attending: Urology

## 2024-08-04 DIAGNOSIS — R310 Gross hematuria: Secondary | ICD-10-CM

## 2024-08-04 MED ORDER — IOPAMIDOL (ISOVUE-300) INJECTION 61%
120.0000 mL | Freq: Once | INTRAVENOUS | Status: AC | PRN
  Administered 2024-08-04: 120 mL via INTRAVENOUS
